# Patient Record
Sex: Female | Born: 1955
Health system: Southern US, Community
[De-identification: ages and names within clinical notes are randomized; demographics above are authoritative.]

## PROBLEM LIST (undated history)

## (undated) DIAGNOSIS — K449 Diaphragmatic hernia without obstruction or gangrene: Secondary | ICD-10-CM

## (undated) DIAGNOSIS — C50812 Malignant neoplasm of overlapping sites of left female breast: Secondary | ICD-10-CM

## (undated) DIAGNOSIS — F988 Other specified behavioral and emotional disorders with onset usually occurring in childhood and adolescence: Secondary | ICD-10-CM

## (undated) DIAGNOSIS — E782 Mixed hyperlipidemia: Secondary | ICD-10-CM

## (undated) DIAGNOSIS — F32A Depression, unspecified: Secondary | ICD-10-CM

## (undated) DIAGNOSIS — M810 Age-related osteoporosis without current pathological fracture: Secondary | ICD-10-CM

## (undated) DIAGNOSIS — C801 Malignant (primary) neoplasm, unspecified: Secondary | ICD-10-CM

## (undated) DIAGNOSIS — M7710 Lateral epicondylitis, unspecified elbow: Secondary | ICD-10-CM

## (undated) DIAGNOSIS — E041 Nontoxic single thyroid nodule: Secondary | ICD-10-CM

## (undated) DIAGNOSIS — K219 Gastro-esophageal reflux disease without esophagitis: Secondary | ICD-10-CM

## (undated) DIAGNOSIS — Z923 Personal history of irradiation: Secondary | ICD-10-CM

## (undated) DIAGNOSIS — F329 Major depressive disorder, single episode, unspecified: Secondary | ICD-10-CM

## (undated) DIAGNOSIS — R112 Nausea with vomiting, unspecified: Secondary | ICD-10-CM

## (undated) DIAGNOSIS — F419 Anxiety disorder, unspecified: Secondary | ICD-10-CM

## (undated) DIAGNOSIS — Z170 Estrogen receptor positive status [ER+]: Secondary | ICD-10-CM

## (undated) DIAGNOSIS — R002 Palpitations: Secondary | ICD-10-CM

## (undated) DIAGNOSIS — B029 Zoster without complications: Secondary | ICD-10-CM

## (undated) DIAGNOSIS — Z9889 Other specified postprocedural states: Secondary | ICD-10-CM

## (undated) HISTORY — DX: Zoster without complications: B02.9

## (undated) HISTORY — DX: Mixed hyperlipidemia: E78.2

## (undated) HISTORY — PX: THYROIDECTOMY: SHX17

## (undated) HISTORY — DX: Lateral epicondylitis, unspecified elbow: M77.10

## (undated) HISTORY — DX: Other specified behavioral and emotional disorders with onset usually occurring in childhood and adolescence: F98.8

## (undated) HISTORY — PX: TUBAL LIGATION: SHX77

## (undated) HISTORY — PX: VAGINAL HYSTERECTOMY: SUR661

## (undated) HISTORY — DX: Age-related osteoporosis without current pathological fracture: M81.0

## (undated) HISTORY — PX: ELBOW SURGERY: SHX618

---

## 1993-09-03 HISTORY — PX: BREAST EXCISIONAL BIOPSY: SUR124

## 1998-06-24 ENCOUNTER — Encounter: Payer: Self-pay | Admitting: Cardiology

## 1998-06-24 ENCOUNTER — Ambulatory Visit (HOSPITAL_COMMUNITY): Admission: RE | Admit: 1998-06-24 | Discharge: 1998-06-24 | Payer: Self-pay | Admitting: Cardiology

## 2000-04-05 ENCOUNTER — Encounter: Payer: Self-pay | Admitting: Obstetrics and Gynecology

## 2000-04-05 ENCOUNTER — Encounter: Admission: RE | Admit: 2000-04-05 | Discharge: 2000-04-05 | Payer: Self-pay | Admitting: Obstetrics and Gynecology

## 2000-10-31 ENCOUNTER — Other Ambulatory Visit: Admission: RE | Admit: 2000-10-31 | Discharge: 2000-10-31 | Payer: Self-pay | Admitting: Obstetrics and Gynecology

## 2001-05-19 ENCOUNTER — Encounter: Payer: Self-pay | Admitting: Obstetrics and Gynecology

## 2001-05-19 ENCOUNTER — Encounter: Admission: RE | Admit: 2001-05-19 | Discharge: 2001-05-19 | Payer: Self-pay | Admitting: Obstetrics and Gynecology

## 2001-08-15 ENCOUNTER — Encounter: Admission: RE | Admit: 2001-08-15 | Discharge: 2001-08-15 | Payer: Self-pay | Admitting: Endocrinology

## 2001-08-15 ENCOUNTER — Encounter: Payer: Self-pay | Admitting: Endocrinology

## 2001-10-24 ENCOUNTER — Encounter: Payer: Self-pay | Admitting: Surgery

## 2001-10-31 ENCOUNTER — Encounter (INDEPENDENT_AMBULATORY_CARE_PROVIDER_SITE_OTHER): Payer: Self-pay | Admitting: Specialist

## 2001-10-31 ENCOUNTER — Observation Stay (HOSPITAL_COMMUNITY): Admission: RE | Admit: 2001-10-31 | Discharge: 2001-11-01 | Payer: Self-pay | Admitting: Surgery

## 2002-05-01 ENCOUNTER — Other Ambulatory Visit: Admission: RE | Admit: 2002-05-01 | Discharge: 2002-05-01 | Payer: Self-pay | Admitting: Obstetrics and Gynecology

## 2002-06-08 ENCOUNTER — Encounter: Admission: RE | Admit: 2002-06-08 | Discharge: 2002-06-08 | Payer: Self-pay | Admitting: Obstetrics and Gynecology

## 2002-06-08 ENCOUNTER — Encounter: Payer: Self-pay | Admitting: Obstetrics and Gynecology

## 2002-09-22 ENCOUNTER — Encounter: Payer: Self-pay | Admitting: Obstetrics and Gynecology

## 2002-09-22 ENCOUNTER — Encounter: Admission: RE | Admit: 2002-09-22 | Discharge: 2002-09-22 | Payer: Self-pay | Admitting: Obstetrics and Gynecology

## 2003-04-29 ENCOUNTER — Encounter: Payer: Self-pay | Admitting: Emergency Medicine

## 2003-04-29 ENCOUNTER — Emergency Department (HOSPITAL_COMMUNITY): Admission: EM | Admit: 2003-04-29 | Discharge: 2003-04-29 | Payer: Self-pay | Admitting: *Deleted

## 2003-07-02 ENCOUNTER — Other Ambulatory Visit: Admission: RE | Admit: 2003-07-02 | Discharge: 2003-07-02 | Payer: Self-pay | Admitting: Obstetrics and Gynecology

## 2003-10-15 ENCOUNTER — Encounter: Payer: Self-pay | Admitting: Gastroenterology

## 2003-11-05 ENCOUNTER — Encounter: Admission: RE | Admit: 2003-11-05 | Discharge: 2003-11-05 | Payer: Self-pay | Admitting: Obstetrics and Gynecology

## 2004-12-08 ENCOUNTER — Encounter: Admission: RE | Admit: 2004-12-08 | Discharge: 2004-12-08 | Payer: Self-pay | Admitting: Obstetrics and Gynecology

## 2005-05-20 ENCOUNTER — Emergency Department (HOSPITAL_COMMUNITY): Admission: EM | Admit: 2005-05-20 | Discharge: 2005-05-20 | Payer: Self-pay | Admitting: Emergency Medicine

## 2005-05-24 ENCOUNTER — Ambulatory Visit: Payer: Self-pay | Admitting: Gastroenterology

## 2005-06-01 ENCOUNTER — Ambulatory Visit: Payer: Self-pay | Admitting: Cardiology

## 2005-06-15 ENCOUNTER — Ambulatory Visit (HOSPITAL_COMMUNITY): Admission: RE | Admit: 2005-06-15 | Discharge: 2005-06-15 | Payer: Self-pay | Admitting: Gastroenterology

## 2005-07-23 ENCOUNTER — Encounter: Admission: RE | Admit: 2005-07-23 | Discharge: 2005-07-23 | Payer: Self-pay | Admitting: Obstetrics and Gynecology

## 2006-01-14 ENCOUNTER — Encounter: Admission: RE | Admit: 2006-01-14 | Discharge: 2006-01-14 | Payer: Self-pay | Admitting: Internal Medicine

## 2007-06-24 ENCOUNTER — Encounter: Admission: RE | Admit: 2007-06-24 | Discharge: 2007-06-24 | Payer: Self-pay | Admitting: Internal Medicine

## 2007-06-24 ENCOUNTER — Encounter: Admission: RE | Admit: 2007-06-24 | Discharge: 2007-06-24 | Payer: Self-pay | Admitting: Obstetrics and Gynecology

## 2007-11-25 ENCOUNTER — Encounter: Admission: RE | Admit: 2007-11-25 | Discharge: 2007-11-25 | Payer: Self-pay | Admitting: Internal Medicine

## 2007-12-17 ENCOUNTER — Encounter: Admission: RE | Admit: 2007-12-17 | Discharge: 2007-12-17 | Payer: Self-pay | Admitting: Internal Medicine

## 2008-07-22 ENCOUNTER — Encounter: Admission: RE | Admit: 2008-07-22 | Discharge: 2008-07-22 | Payer: Self-pay | Admitting: Internal Medicine

## 2008-10-14 ENCOUNTER — Encounter: Admission: RE | Admit: 2008-10-14 | Discharge: 2008-10-14 | Payer: Self-pay | Admitting: Sports Medicine

## 2009-08-04 ENCOUNTER — Encounter: Admission: RE | Admit: 2009-08-04 | Discharge: 2009-08-04 | Payer: Self-pay | Admitting: Obstetrics and Gynecology

## 2009-09-05 ENCOUNTER — Telehealth: Payer: Self-pay | Admitting: Gastroenterology

## 2009-09-06 ENCOUNTER — Encounter: Payer: Self-pay | Admitting: Gastroenterology

## 2009-10-10 ENCOUNTER — Ambulatory Visit: Payer: Self-pay | Admitting: Gastroenterology

## 2009-10-10 DIAGNOSIS — F319 Bipolar disorder, unspecified: Secondary | ICD-10-CM | POA: Insufficient documentation

## 2009-10-10 DIAGNOSIS — K219 Gastro-esophageal reflux disease without esophagitis: Secondary | ICD-10-CM | POA: Insufficient documentation

## 2009-10-28 ENCOUNTER — Emergency Department (HOSPITAL_COMMUNITY): Admission: EM | Admit: 2009-10-28 | Discharge: 2009-10-28 | Payer: Self-pay | Admitting: Emergency Medicine

## 2009-11-14 ENCOUNTER — Encounter (INDEPENDENT_AMBULATORY_CARE_PROVIDER_SITE_OTHER): Payer: Self-pay | Admitting: *Deleted

## 2009-12-09 ENCOUNTER — Encounter (INDEPENDENT_AMBULATORY_CARE_PROVIDER_SITE_OTHER): Payer: Self-pay | Admitting: *Deleted

## 2009-12-14 ENCOUNTER — Ambulatory Visit: Payer: Self-pay | Admitting: Gastroenterology

## 2009-12-19 ENCOUNTER — Telehealth: Payer: Self-pay | Admitting: Gastroenterology

## 2009-12-20 ENCOUNTER — Ambulatory Visit: Payer: Self-pay | Admitting: Gastroenterology

## 2010-08-24 ENCOUNTER — Encounter
Admission: RE | Admit: 2010-08-24 | Discharge: 2010-08-24 | Payer: Self-pay | Source: Home / Self Care | Attending: Obstetrics and Gynecology | Admitting: Obstetrics and Gynecology

## 2010-09-24 ENCOUNTER — Encounter: Payer: Self-pay | Admitting: Obstetrics and Gynecology

## 2010-10-03 NOTE — Miscellaneous (Signed)
Summary: [facility] Previsit/prep  Clinical Lists Changes  Medications: Added new medication of MOVIPREP 100 GM  SOLR (PEG-KCL-NACL-NASULF-NA ASC-C) As per prep instructions. - Signed Rx of MOVIPREP 100 GM  SOLR (PEG-KCL-NACL-NASULF-NA ASC-C) As per prep instructions.;  #1 x 0;  Signed;  Entered by: [name] RN;  Authorized by: Louis Meckel MD;  Method used: Electronically to [facility]*, [address], [place], [place]  [address], Ph: [phone] or [phone], Fax: [phone] Observations: Added new observation of ALLERGY REV: Done (12/14/2009 14:31)    Prescriptions: MOVIPREP 100 GM  SOLR (PEG-KCL-NACL-NASULF-NA ASC-C) As per prep instructions.  #1 x 0   Entered by:   [name] RN   Authorized by:   Louis Meckel MD   Signed by:   [name] RN on 12/14/2009   Method used:   Electronically to        [facility]* (retail)       [address] [place], [place]  [address]       Ph: [phone] or [phone]       Fax: [phone]   RxID:   [id number]

## 2010-10-03 NOTE — Letter (Signed)
Summary: Results Letter  [facility]  [address] [place], [place] [address]   Phone: [phone]  Fax: [phone]        October 10, 2009 MRN: [id number]    [facility] [place] [address] [place], [place]  [address]    Dear Ms. Maradiaga,  It is my pleasure to have treated you recently as a new patient in my office. I appreciate your confidence and the opportunity to participate in your care.  Since I do have a busy inpatient endoscopy schedule and office schedule, my office hours vary weekly. I am, however, available for emergency calls everyday through my office. If I am not available for an urgent office appointment, another one of our gastroenterologist will be able to assist you.  My well-trained staff are prepared to help you at all times. For emergencies after office hours, a physician from our Gastroenterology section is always available through my 24 hour answering service  Once again I welcome you as a new patient and I look forward to a happy and healthy relationship             Sincerely,  Louis Meckel MD  This letter has been electronically signed by your physician.  Appended Document: Results Letter letter mailed

## 2010-10-03 NOTE — Letter (Signed)
Summary: Previsit letter  [facility]  [address] [place], [place] [address]   Phone: [phone]  Fax: [phone]       11/14/2009 MRN: [id number]  [facility] [place] [address] [name], [place]  [address]  Dear Ms. Belko,  Welcome to the Gastroenterology Division at [facility].    You are scheduled to see a nurse for your pre-procedure visit on 12-14-09 at 2:30pm on the 3rd floor at [facility], [address]  We ask that you try to arrive at our office 15 minutes prior to your appointment time to allow for check-in.  Your nurse visit will consist of discussing your medical and surgical history, your immediate family medical history, and your medications.    Please bring a complete list of all your medications or, if you prefer, bring the medication bottles and we will list them.  We will need to be aware of both prescribed and over the counter drugs.  We will need to know exact dosage information as well.  If you are on blood thinners (Coumadin, Plavix, Aggrenox, Ticlid, etc.) please call our office today/prior to your appointment, as we need to consult with your physician about holding your medication.   Please be prepared to read and sign documents such as consent forms, a financial agreement, and acknowledgement forms.  If necessary, and with your consent, a friend or relative is welcome to sit-in on the nurse visit with you.  Please bring your insurance card so that we may make a copy of it.  If your insurance requires a referral to see a specialist, please bring your referral form from your primary care physician.  No co-pay is required for this nurse visit.     If you cannot keep your appointment, please call [phone] to cancel or reschedule prior to your appointment date.  This allows [place] the opportunity to schedule an appointment for another patient in need of care.    Thank you for choosing [facility] for your medical needs.   We appreciate the opportunity to care for you.  Please visit [place] at our website  to learn more about our practice.                     Sincerely.                                                                                                                   The Gastroenterology Division

## 2010-10-03 NOTE — Procedures (Signed)
Summary: Colonoscopy  Patient: Dawn Booker Note: All result statuses are Final unless otherwise noted.  Tests: (1) Colonoscopy (COL)   COL Colonoscopy           DONE     [facility]     [address]     [place], [place]  [address]           COLONOSCOPY PROCEDURE REPORT           PATIENT:  Dawn Booker, Dawn Booker  MR#:  [id number]     BIRTHDATE:  December 22, 1955, 53 yrs. old  GENDER:  female           ENDOSCOPIST:  Barbette Hair. Arlyce Dice, MD     Referred by:           PROCEDURE DATE:  12/20/2009     PROCEDURE:  Diagnostic Colonoscopy     ASA CLASS:  Class II     INDICATIONS:  1) Routine Risk Screening           MEDICATIONS:   Fentanyl 125 mcg IV, Versed 13 mg IV, Benadryl 50     mg IV           DESCRIPTION OF PROCEDURE:   After the risks benefits and     alternatives of the procedure were thoroughly explained, informed     consent was obtained.  Digital rectal exam was performed and     revealed no abnormalities.   The LB CF-[id number] endoscope     was introduced through the anus and advanced to the cecum, which     was identified by both the appendix and ileocecal valve, without     limitations.  The quality of the prep was excellent, using     MoviPrep.  The instrument was then slowly withdrawn as the colon     was fully examined.     <<PROCEDUREIMAGES>>           FINDINGS:  A normal appearing cecum, ileocecal valve, and     appendiceal orifice were identified. The ascending, hepatic     flexure, transverse, splenic flexure, descending, sigmoid colon,     and rectum appeared unremarkable (see image1, image2, image3,     image4, image5, and image7).   Retroflexed views in the rectum     revealed no abnormalities.    The time to cecum =  4.25  minutes.     The scope was then withdrawn (time =  5  min) from the patient and     the procedure completed.           COMPLICATIONS:  None           ENDOSCOPIC IMPRESSION:     1) Normal colon     RECOMMENDATIONS:     1) Continue  current colorectal screening recommendations for     "routine risk" patients with a repeat colonoscopy in 10 years.           REPEAT EXAM:  In 10 year(s) for Colonoscopy.           ______________________________     Barbette Hair. Arlyce Dice, MD           CC: Guerry Bruin, MD           n.     Rosalie Doctor:   Barbette Hair. Kaplan at 12/20/2009 09:06 AM           Adrian Blackwater, [id number]  Note: An exclamation mark (!) indicates a result that  was not dispersed into the flowsheet. Document Creation Date: 12/20/2009 9:06 AM _______________________________________________________________________  (1) Order result status: Final Collection or observation date-time: 12/20/2009 08:59 Requested date-time:  Receipt date-time:  Reported date-time:  Referring Physician:   Ordering Physician: Melvia Heaps [phone]) Specimen Source:  Source: Launa Grill Order Number: [id number] Lab site:   Appended Document: Colonoscopy    Clinical Lists Changes  Observations: Added new observation of COLONNXTDUE: 12/2019 (12/20/2009 13:19)

## 2010-10-03 NOTE — Progress Notes (Signed)
Summary: Patient wants to switch Physician   Phone Note Call from Patient Call back at Home Phone [phone] Call back at [phone]   Caller: Patient Call For: Dr. Arlyce Dice Reason for Call: Talk to Nurse Summary of Call: Pt. is a patient of Dr. Russella Dar she is wanting to switch to Dr. Arlyce Dice to do a colonoscopy. Pt said she saw Dr. Arlyce Dice in the past but I do not see any record of this. Initial call taken by: Karna Christmas,  September 05, 2009 3:10 PM  Follow-up for Phone Call        last appointment with Dr Russella Dar in 06, I have requested her paper chart for both MD review. Darcey Nora RN, [facility]  September 05, 2009 3:25 PM  Patient doesn't feel Dr Russella Dar has helped her.  She wants to schedule a colonoscopy with Dr Arlyce Dice.  I have pulled her paper chart and see no hx with Dr Arlyce Dice.  Patient  would still like to transfer to Dr Arlyce Dice.  Dr Russella Dar do you approve? Follow-up by: Darcey Nora RN, CGRN,  September 06, 2009 8:49 AM  Additional Follow-up for Phone Call Additional follow up Details #1::        OK with me. Additional Follow-up by: Meryl Dare MD Clementeen Graham,  September 06, 2009 9:43 AM    Additional Follow-up for Phone Call Additional follow up Details #2::    Dr Arlyce Dice will you accept this patient? Follow-up by: Darcey Nora RN, CGRN,  September 06, 2009 9:47 AM  Additional Follow-up for Phone Call Additional follow up Details #3:: Details for Additional Follow-up Action Taken: yes Additional Follow-up by: Louis Meckel MD,  September 06, 2009 10:11 AM   Appended Document: Patient wants to switch Physician Message left for pt. that MD switch is approved and she has an appt. with Dr.Kaplan on 10-03-09 at 11:15am, appt. letter also mailed to her. Pt. instructed to call back as needed.

## 2010-10-03 NOTE — Letter (Signed)
Summary: [facility] Instructions  [facility]  [address] [place], [place] [address]   Phone: [phone]  Fax: [phone]       Dawn Booker    December 07, 1955    MRN: [id number]        Procedure Day Dorna Bloom:  Jake Shark  12/20/09     Arrival Time:  7:30AM     Procedure Time:  8:30AM     Location of Procedure:                    _ X_  [facility] (4th Floor)                        PREPARATION FOR COLONOSCOPY WITH MOVIPREP   Starting 5 days prior to your procedure 12/15/09 do not eat nuts, seeds, popcorn, corn, beans, peas,  salads, or any raw vegetables.  Do not take any fiber supplements (e.g. Metamucil, Citrucel, and Benefiber).  THE DAY BEFORE YOUR PROCEDURE         DATE: 12/19/09  DAY: MONDAY  1.  Drink clear liquids the entire day-NO SOLID FOOD  2.  Do not drink anything colored red or purple.  Avoid juices with pulp.  No orange juice.  3.  Drink at least 64 oz. (8 glasses) of fluid/clear liquids during the day to prevent dehydration and help the prep work efficiently.  CLEAR LIQUIDS INCLUDE: Water Jello Ice Popsicles Tea (sugar ok, no milk/cream) Powdered fruit flavored drinks Coffee (sugar ok, no milk/cream) Gatorade Juice: apple, white grape, white cranberry  Lemonade Clear bullion, consomm, broth Carbonated beverages (any kind) Strained chicken noodle soup Hard Candy                             4.  In the morning, mix first dose of MoviPrep solution:    Empty 1 Pouch A and 1 Pouch B into the disposable container    Add lukewarm drinking water to the top line of the container. Mix to dissolve    Refrigerate (mixed solution should be used within 24 hrs)  5.  Begin drinking the prep at 5:00 p.m. The MoviPrep container is divided by 4 marks.   Every 15 minutes drink the solution down to the next mark (approximately 8 oz) until the full liter is complete.   6.  Follow completed prep with 16 oz of clear liquid of your choice (Nothing  red or purple).  Continue to drink clear liquids until bedtime.  7.  Before going to bed, mix second dose of MoviPrep solution:    Empty 1 Pouch A and 1 Pouch B into the disposable container    Add lukewarm drinking water to the top line of the container. Mix to dissolve    Refrigerate  THE DAY OF YOUR PROCEDURE      DATE: 12/20/09   DAY: TUESDAY  Beginning at 3:30AM (5 hours before procedure):         1. Every 15 minutes, drink the solution down to the next mark (approx 8 oz) until the full liter is complete.  2. Follow completed prep with 16 oz. of clear liquid of your choice.    3. You may drink clear liquids until 6:30AM (2 HOURS BEFORE PROCEDURE).   MEDICATION INSTRUCTIONS  Unless otherwise instructed, you should take regular prescription medications with a small sip of water   as early as possible the  morning of your procedure.           OTHER INSTRUCTIONS  You will need a responsible adult at least 55 years of age to accompany you and drive you home.   This person must remain in the waiting room during your procedure.  Wear loose fitting clothing that is easily removed.  Leave jewelry and other valuables at home.  However, you may wish to bring a book to read or  an iPod/MP3 player to listen to music as you wait for your procedure to start.  Remove all body piercing jewelry and leave at home.  Total time from sign-in until discharge is approximately 2-3 hours.  You should go home directly after your procedure and rest.  You can resume normal activities the  day after your procedure.  The day of your procedure you should not:   Drive   Make legal decisions   Operate machinery   Drink alcohol   Return to work  You will receive specific instructions about eating, activities and medications before you leave.    The above instructions have been reviewed and explained to me by   [name] RN  December 14, 2009 3:22 PM     I fully understand and  can verbalize these instructions _____________________________ Date _________

## 2010-10-03 NOTE — Letter (Signed)
Summary: New Patient letter  [facility]  [address] [place], [place] [address]   Phone: [phone]  Fax: [phone]       09/06/2009 MRN: [id number]  [facility] [place] [address] [place], [place]  [address]  Dear Ms. Awan,  Welcome to the Gastroenterology Division at [facility].    You are scheduled to see Dr.  Melvia Heaps on 10-03-2009 at 11:15am,  on the 3rd floor at [facility], [address]  We ask that you try to arrive at our office 15 minutes prior to your appointment time to allow for check-in.  We would like you to complete the enclosed self-administered evaluation form prior to your visit and bring it with you on the day of your appointment.  We will review it with you.  Also, please bring a complete list of all your medications or, if you prefer, bring the medication bottles and we will list them.  Please bring your insurance card so that we may make a copy of it.  If your insurance requires a referral to see a specialist, please bring your referral form from your primary care physician.  Co-payments are due at the time of your visit and may be paid by cash, check or credit card.     Your office visit will consist of a consult with your physician (includes a physical exam), any laboratory testing he/she may order, scheduling of any necessary diagnostic testing (e.g. x-ray, ultrasound, CT-scan), and scheduling of a procedure (e.g. Endoscopy, Colonoscopy) if required.  Please allow enough time on your schedule to allow for any/all of these possibilities.    If you cannot keep your appointment, please call [phone] to cancel or reschedule prior to your appointment date.  This allows [place] the opportunity to schedule an appointment for another patient in need of care.  If you do not cancel or reschedule by 5 p.m. the business day prior to your appointment date, you will be charged a $50.00 late cancellation/no-show fee.    Thank you for  choosing [facility] for your medical needs.  We appreciate the opportunity to care for you.  Please visit [place] at our website  to learn more about our practice.                     Sincerely,                                                             The Gastroenterology Division   Appended Document: New Patient letter Letter mailed to patient.

## 2010-10-03 NOTE — Procedures (Signed)
Summary: EGD   EGD  Procedure date:  10/15/2003  Findings:      Location: [facility]   Patient Name: Dawn, Booker MRN:  Procedure Procedures: Panendoscopy (EGD) CPT: 43235.    with biopsy(s)/brushing(s). CPT: D1846139.  Personnel: Endoscopist: Venita Lick. Russella Dar, MD, Clementeen Graham.  Exam Location: Exam performed in Outpatient Clinic. Outpatient  Patient Consent: Procedure, Alternatives, Risks and Benefits discussed, consent obtained, from patient. Consent was obtained by the RN.  Indications Symptoms: Chest Pain. Reflux symptoms  History  Current Medications: Patient is not currently taking Coumadin.  Pre-Exam Physical: Performed Oct 15, 2003  Entire physical exam was normal.  Exam Exam Info: Maximum depth of insertion Duodenum, intended Duodenum. Vocal cords not visualized. Gastric retroflexion performed. ASA Classification: II. Tolerance: excellent.  Sedation Meds: Patient assessed and found to be appropriate for moderate (conscious) sedation. Fentanyl 50 mcg. given IV. Versed 5 mg. given IV. Cetacaine Spray 2 sprays given aerosolized.  Monitoring: BP and pulse monitoring done. Oximetry used. Supplemental O2 given  Findings Normal: Proximal Esophagus to Distal Esophagus.  HIATAL HERNIA: Regular, 3 cms. in length. ICD9: Hernia, Hiatal: 553.3. NODULE: Maximum size: 4 mm. mucosal nodule in Body. ICD9: Neoplasia, Benign, Stomach: 211.1. Comments: Multiple nodules in the body and fundus-several were biopsied.  Normal: Antrum to Duodenal 2nd Portion.   Assessment  Diagnoses: 211.1: Neoplasia, Benign, Stomach.  553.3: Hernia, Hiatal.   Events  Unplanned Intervention: No unplanned interventions were required.  Unplanned Events: There were no complications. Plans Medication(s): Await pathology. Continue current medications.  Patient Education: Patient given standard instructions for: Hiatal Hernia. Reflux.  Disposition: After procedure patient sent  to recovery. After recovery patient sent home.  Scheduling: Await pathology to schedule patient. Primary Care Provider, to Guerry Bruin, MD,  Office Visit, to [facility]. Russella Dar, MD, Clementeen Graham, prn    cc: Guerry Bruin, MD  This report was created from the original endoscopy report, which was reviewed and signed by the above listed endoscopist.

## 2010-10-03 NOTE — Assessment & Plan Note (Signed)
Summary: Office visit- GI [facility]ONA [facility]OST MR#:  [id number] Page #  NAME:  Dawn Booker, Dawn Booker  OFFICE NO:  [id number]  DATE:  05/24/05  DOB:  2056-02-26  HISTORY OF PRESENT ILLNESS:  The patient had the acute onset of right-sided abdominal pain associated with nausea, vomiting, and diarrhea beginning this past Saturday.  It occurred about 3 hours after she had eaten a salad that she had prepared at home from a head of lettuce.  She states there was no hematemesis, hematochezia, melena, fevers, or chills.  Her symptoms improved slightly without eating the rest of the day and when she had a small amount to eat on Sunday, she developed severe right upper quadrant pain that radiated around her right flank to her back and radiated to her right lower chest and recurrent diarrhea.  She presented to [facility] Room at about 2 p.m. on Sunday, September 17.  Vital signs, CBC, C-MET, amylase, and lipase were all unremarkable.  An abdominal ultrasound was performed that showed a small mass in the left kidney with an echogenic focus.  It measured 8 mm x 9 mm x 11 mm.  The differential diagnosis included an angiomyolipoma versus a partially calcified renal mass.  A CT scan or MRI of the kidneys was recommended.  Her symptoms since then have completely resolved except for some very minimal right upper quadrant soreness.  She has been eat normally and her bowel habits have returned to normal.    CURRENT MEDICATIONS:  Listed on the chart, updated and reviewed.   MEDICATION ALLERGIES: Sulfa drugs.  PHYSICAL EXAMINATION:  No acute distress.  Weight 155.4 pounds. Blood pressure is 112/72.  Pulse 68 and regular. HEENT exam:  Anicteric sclerae; oropharynx clear.  Chest:  Clear to auscultation bilaterally.  Cardiac:  Regular rate and rhythm without murmurs.  Abdomen is soft with minimal right upper quadrant tenderness to very deep palpation.  No rebound or guarding.  No palpable organomegaly, masses, or  hernias.  Normoactive bowel sounds. Back exam reveals no costovertebral angle or spinal tenderness.     ASSESSMENT AND PLAN:   1.  Acute right upper quadrant abdominal pain, associated with diarrhea.  I suspect she has food poisoning or viral gastroenteritis.  Will proceed with a CCK stimulated hepatobiliary scan to exclude a calculus cholecystitis.   2.  A small left renal lesion.  Will review her prior ultrasound studies performed in our office in 2000, 2001, and 2005.  If this lesion cannot be confirmed on the prior studies, then we will proceed with a detailed CT scan of the kidneys for further evaluation.       Venita Lick. Russella Dar, M.D., F.A.C.G. [id number] cc:  Gaspar Garbe, M.D.  D:  05/24/05; T:  ; Job [id number]

## 2010-10-03 NOTE — Assessment & Plan Note (Signed)
Summary: TO ESTABLISH W/DR.Arlyce Dice, MD [facility] APPROVED.      Dawn Booker    History of Present Illness Visit Type: Initial Visit Primary GI MD: Melvia Heaps MD [facility] Primary Provider: Sarita Haver Requesting Provider: n/a Chief Complaint: To establish care with Dr Arlyce Dice, was pt of Dr Russella Dar and to discuss colonoscopy History of Present Illness:   Dawn Booker is a pleasant 55 year old white female referred at the request of  Guerry Bruin, M.D. for colorectal cancer screening.  Her main GI complaint is mild constipation.  She denies melena or hematochezia.  On one occasion, approximately one month ago, she had an episode of severe crampy abdominal pain that began in her upper abdomen and evenually moved to her lower abdomen.  This was not followed by any bowel movements for 24 hours.  She also has a history of GERD for which she takes Prevacid    GI Review of Systems    Reports abdominal pain, acid reflux, heartburn, and  nausea.      Denies belching, bloating, chest pain, dysphagia with liquids, dysphagia with solids, loss of appetite, vomiting, vomiting blood, weight loss, and  weight gain.      Reports change in bowel habits and  constipation.     Denies anal fissure, black tarry stools, diarrhea, diverticulosis, fecal incontinence, heme positive stool, hemorrhoids, irritable bowel syndrome, jaundice, light color stool, liver problems, rectal bleeding, and  rectal pain. Preventive Screening-Counseling & Management  Alcohol-Tobacco     Smoking Status: current    Current Medications (verified): 1)  Fish Oil  Oil (Fish Oil) 2)  Wellbutrin Xl 300 Mg Xr24h-Tab (Bupropion Hcl) .Marland Kitchen.. 1 By Mouth Once Daily 3)  Alprazolam 0.5 Mg Tabs (Alprazolam) .Marland Kitchen.. 1 By Mouth As Needed 4)  Folic Acid 400 Mcg Tabs (Folic Acid) .Marland Kitchen.. 1 By Mouth Once Daily 5)  Temazepam 30 Mg Caps (Temazepam) .Marland Kitchen.. 1 By Mouth Once Daily 6)  Vitamin D3 2000 Unit Caps (Cholecalciferol) .Marland Kitchen.. 1 By Mouth Once Daily 7)   Prevacid 24hr 15 Mg Cpdr (Lansoprazole) .Marland Kitchen.. 1 By Mouth Once Daily 8)  Lexapro 5 Mg Tabs (Escitalopram Oxalate) .Marland Kitchen.. 1 By Mouth Once Daily 9)  Elestrin 0.52 Mg/0.87 Gm (0.06%) Gel (Estradiol) .... Use Daily 10)  Benadryl 25 Mg Tabs (Diphenhydramine Hcl) .... As Needed  Allergies: 1)  ! Sulfa 2)  ! Pcn 3)  ! Morphine  Past History:  Past Medical History: Hiatal hernia GERD Bipolar disorder Mitral valve prolapse Hyperlipidemia Anxiety Disorder Depression  Past Surgical History: Hysterectomy 1990's  Family History: Family History of Breast Cancer: Family History of Colon Cancer: Family History of Colon Polyps: Family History of Diabetes:  Family History of Heart Disease:   Social History: married 2 children Occupation: [place] [employer] Alcohol Use - yes  social Patient currently smokes. social Smoking Status:  current  Review of Systems       The patient complains of anxiety-new, back pain, headaches-new, and night sweats.  The patient denies allergy/sinus, anemia, arthritis/joint pain, blood in urine, breast changes/lumps, change in vision, confusion, cough, coughing up blood, depression-new, fainting, fatigue, fever, hearing problems, heart murmur, heart rhythm changes, itching, muscle pains/cramps, nosebleeds, shortness of breath, skin rash, sleeping problems, sore throat, swelling of feet/legs, swollen lymph glands, thirst - excessive, urination - excessive, urination changes/pain, urine leakage, vision changes, and voice change.         All other systems were reviewed and were negative   Vital Signs:  Patient profile:   55 year  old female Height:      65.5 inches Weight:      156 pounds BMI:     25.66 BSA:     1.79 Pulse rate:   80 / minute BP sitting:   100 / 70  (left arm)  Vitals Entered By: Merri Ray CMA Duncan Dull) (October 10, 2009 3:21 PM)  Physical Exam  Additional Exam:  She is a healthy-appearing female  skin:  anicteric HEENT: normocephalic; PEERLA; no nasal or pharyngeal abnormalities neck: supple nodes: no cervical lymphadenopathy chest: clear to ausculatation and percussion heart: no murmurs, gallops, or rubs abd: soft, nontender; BS normoactive; no abdominal masses, tenderness, organomegaly rectal: deferred ext: no cynanosis, clubbing, edema skeletal: no deformities neuro: oriented x 3; no focal abnormalities    Impression & Recommendations:  Problem # 1:  SPECIAL SCREENING FOR MALIGNANT NEOPLASMS COLON (ICD-V76.51) Plans screening colonoscopy  Risks, complications and alternatives to the procedure were explained to the patient, including bleeding, perforation, and the possible need for surgery.  Patients questions were answered.  Problem # 2:  ESOPHAGEAL REFLUX (ICD-530.81) Plan  to continue Prevacid  Patient Instructions: 1)  Colonoscopy and Flexible Sigmoidoscopy brochure given.  2)  Conscious Sedation brochure given.  3)  You will need to call back to schedule your colonoscopy at your convenience. 4)  At that time you will be scheduled a Pre-visit with a nurse to get all instructions and sign paperwork 5)  Continue Prevacid 6)  cc Guerry Bruin, M.D. 7)  The medication list was reviewed and reconciled.  All changed / newly prescribed medications were explained.  A complete medication list was provided to the patient / caregiver.

## 2010-10-03 NOTE — Progress Notes (Signed)
Summary: prep ?   Phone Note Call from Patient Call back at [facility] Phone [phone]   Caller: Patient Call For: Dr. Arlyce Dice Reason for Call: Talk to Nurse Summary of Call: prep ? Initial call taken by: Vallarie Mare,  December 19, 2009 2:24 PM  Follow-up for Phone Call        Returned phone call to pt.  No answer, left message on the answering machine to please call back before 5:00 if possible if still has a question. Follow-up by: Eual Fines RN,  December 19, 2009 3:00 PM  Additional Follow-up for Phone Call Additional follow up Details #1::        recieved call from pt. wanting to know if she could still have broth  and popcsile if she gets hungry? informed pt. that she could have broth and items that was listed on prep sheet until 2hrs before procedure. Additional Follow-up by: Eual Fines RN,  December 19, 2009 3:15 PM

## 2011-01-19 NOTE — Op Note (Signed)
[facility]  Patient:    Dawn Booker, Dawn Booker Visit Number: [id number] MRN: [id number]          Service Type: SUR Location: [id number] Attending Physician:  Bonnetta Barry Dictated by:   Velora Heckler, M.D. Proc. Date: 10/31/01 Admit Date:  10/31/2001 Discharge Date: 11/01/2001   CC:         Jeannett Senior A. Evlyn Kanner, M.D.   Operative Report  PREOPERATIVE DIAGNOSIS:  Left thyroid nodule.  POSTOPERATIVE DIAGNOSIS:  Left thyroid nodule.  PROCEDURE:  Left thyroid lobectomy.  SURGEON:  Velora Heckler, M.D.  ASSISTANT:  Gita Kudo, M.D.  ANESTHESIA:  General.  ESTIMATED BLOOD LOSS:  Minimal.  PREPARATION:  Betadine.  COMPLICATIONS:  None.  INDICATIONS:  The patient is a 55 year old white female, who presents to my practice with a longstanding left-sided thyroid nodule.  This has been present for approximately four years.  Fine-needle aspiration cytology has been benign.  The patient, however, has noted a slow increase in the size of the nodule.  She now comes to surgery for excision.  DESCRIPTION OF PROCEDURE:  The procedure is done in OR #10 at the [facility].  The patient is brought to the operating room and placed in a supine position on the operating room table.  Following administration of general anesthesia, the patient is prepped and draped in the usual strict aseptic fashion.  After ascertaining that an adequate level of anesthesia had been obtained, a Kocher incision is made with a #10 blade.  Dissection is carried through the subcutaneous tissues and platysma.  Hemostasis is obtained with the electrocautery.  Next, skin flaps are developed cephalad and caudad from the thyroid notch to the sternal notch.  A Mahorner self-retaining retractor is placed for exposure.  Strap muscles are incised in the midline. Dissection is carried down to the thyroid gland.  Dissection is begun on the left side.  Strap muscles are  reflected laterally.  Left lobe is markedly enlarged containing a large central nodule, measuring approximately 3-3.5 cm in greatest dimension.  Venous tributaries are divided between small Ligaclips.  Gland is rolled anteriorly.  Using blunt dissection, adventitial tissue is dissected away from the thyroid capsule.  Superior pole is mobilized.  Superior pole vessels are ligated in continuity with 2-0 silk ligatures and medium Ligaclips.  Vessels are then divided.  Gland is rolled anteriorly.  Recurrent laryngeal nerve is identified and preserved. Parathyroid tissue is identified and preserved.  Gland is rolled up and onto the trachea.  Ligament of Allyson Sabal is divided with the electrocautery.  Branches of the inferior thyroid artery are divided between small Ligaclips.  Gland is mobilized across the trachea.  Isthmus is divided between hemostats, and the left lobe is completely excised.  It is sectioned on the table.  There is approximately a 3 cm large nodule located centrally.  This is submitted fresh to pathology for frozen section analysis.  Dr. Jessica Priest Smir reads this as a benign follicular lesion, most likely representing a hyperplastic nodule.  No sign of malignancy was identified.  Next, the right lobe is explored.  Strap muscles are reflected laterally.  On palpitation, the right lobe is soft without nodularity.  It is of normal size.  No further dissection is performed in the right neck.  The left neck is irrigated with warm saline.  A piece of Surgicel is placed over the area of the recurrent laryngeal nerves.  Strap muscles are reapproximated in the midline  with interrupted 3-0 Vicryl sutures. Platysma is reapproximated with interrupted 3-0 Vicryl sutures.  Skin edges are closed with widely spaced stainless steel staples and interspaced half-inch Steri-Strips and Benzoin.  Sterile gauze dressings are applied.  The patient is awakened from anesthesia and brought to the recovery room  in stable condition.  The patient tolerated the procedure well. Dictated by:   Velora Heckler, M.D. Attending Physician:  Bonnetta Barry DD:  10/31/01 TD:  11/02/01 Job: [id number]

## 2011-07-16 ENCOUNTER — Other Ambulatory Visit: Payer: Self-pay | Admitting: Obstetrics and Gynecology

## 2011-07-16 DIAGNOSIS — Z1231 Encounter for screening mammogram for malignant neoplasm of breast: Secondary | ICD-10-CM

## 2011-08-30 ENCOUNTER — Ambulatory Visit
Admission: RE | Admit: 2011-08-30 | Discharge: 2011-08-30 | Disposition: A | Discharge status: Home / Self Care | Payer: BC Managed Care – PPO | Source: Ambulatory Visit | Attending: Obstetrics and Gynecology | Admitting: Obstetrics and Gynecology

## 2011-08-30 DIAGNOSIS — Z1231 Encounter for screening mammogram for malignant neoplasm of breast: Secondary | ICD-10-CM

## 2012-05-09 ENCOUNTER — Other Ambulatory Visit: Payer: Self-pay | Admitting: Obstetrics and Gynecology

## 2012-05-09 DIAGNOSIS — M8580 Other specified disorders of bone density and structure, unspecified site: Secondary | ICD-10-CM

## 2012-05-14 ENCOUNTER — Other Ambulatory Visit: Payer: BC Managed Care – PPO

## 2012-08-06 ENCOUNTER — Other Ambulatory Visit: Payer: Self-pay | Admitting: Obstetrics and Gynecology

## 2012-08-06 DIAGNOSIS — Z1231 Encounter for screening mammogram for malignant neoplasm of breast: Secondary | ICD-10-CM

## 2012-10-10 ENCOUNTER — Ambulatory Visit: Payer: BC Managed Care – PPO

## 2012-10-24 ENCOUNTER — Ambulatory Visit: Payer: BC Managed Care – PPO

## 2013-06-03 ENCOUNTER — Telehealth: Payer: Self-pay | Admitting: Genetic Counselor

## 2013-06-03 NOTE — Telephone Encounter (Signed)
LEFT PT VM TO RETURN CALL IN REF. TO GENETIC COUNS °

## 2013-09-30 ENCOUNTER — Emergency Department (HOSPITAL_COMMUNITY): Payer: BC Managed Care – PPO

## 2013-09-30 ENCOUNTER — Encounter (HOSPITAL_COMMUNITY): Payer: Self-pay | Admitting: Emergency Medicine

## 2013-09-30 ENCOUNTER — Emergency Department (HOSPITAL_COMMUNITY)
Admission: EM | Admit: 2013-09-30 | Discharge: 2013-09-30 | Disposition: A | Discharge status: Home / Self Care | Payer: BC Managed Care – PPO | Attending: Emergency Medicine | Admitting: Emergency Medicine

## 2013-09-30 DIAGNOSIS — F411 Generalized anxiety disorder: Secondary | ICD-10-CM | POA: Insufficient documentation

## 2013-09-30 DIAGNOSIS — R0789 Other chest pain: Secondary | ICD-10-CM

## 2013-09-30 DIAGNOSIS — Z8719 Personal history of other diseases of the digestive system: Secondary | ICD-10-CM | POA: Insufficient documentation

## 2013-09-30 DIAGNOSIS — Z79899 Other long term (current) drug therapy: Secondary | ICD-10-CM | POA: Insufficient documentation

## 2013-09-30 DIAGNOSIS — R071 Chest pain on breathing: Secondary | ICD-10-CM | POA: Insufficient documentation

## 2013-09-30 DIAGNOSIS — F3289 Other specified depressive episodes: Secondary | ICD-10-CM | POA: Insufficient documentation

## 2013-09-30 DIAGNOSIS — F329 Major depressive disorder, single episode, unspecified: Secondary | ICD-10-CM | POA: Insufficient documentation

## 2013-09-30 DIAGNOSIS — Z8679 Personal history of other diseases of the circulatory system: Secondary | ICD-10-CM | POA: Insufficient documentation

## 2013-09-30 DIAGNOSIS — F17200 Nicotine dependence, unspecified, uncomplicated: Secondary | ICD-10-CM | POA: Insufficient documentation

## 2013-09-30 HISTORY — DX: Anxiety disorder, unspecified: F41.9

## 2013-09-30 HISTORY — DX: Major depressive disorder, single episode, unspecified: F32.9

## 2013-09-30 HISTORY — DX: Depression, unspecified: F32.A

## 2013-09-30 LAB — COMPREHENSIVE METABOLIC PANEL
ALT: 15 U/L (ref 0–35)
AST: 16 U/L (ref 0–37)
Albumin: 4.3 g/dL (ref 3.5–5.2)
Alkaline Phosphatase: 55 U/L (ref 39–117)
BUN: 13 mg/dL (ref 6–23)
CO2: 24 mEq/L (ref 19–32)
Calcium: 9 mg/dL (ref 8.4–10.5)
Chloride: 105 mEq/L (ref 96–112)
Creatinine, Ser: 0.61 mg/dL (ref 0.50–1.10)
GFR calc Af Amer: >90 mL/min (ref >90)
GFR calc non Af Amer: >90 mL/min (ref >90)
Glucose, Bld: 95 mg/dL (ref 70–99)
Potassium: 3.7 mEq/L (ref 3.7–5.3)
Sodium: 142 mEq/L (ref 137–147)
Total Bilirubin: 0.3 mg/dL (ref 0.3–1.2)
Total Protein: 7.2 g/dL (ref 6.0–8.3)

## 2013-09-30 LAB — CBC
HCT: 38.6 % (ref 36.0–46.0)
Hemoglobin: 13.4 g/dL (ref 12.0–15.0)
MCH: 31.9 pg (ref 26.0–34.0)
MCHC: 34.7 g/dL (ref 30.0–36.0)
MCV: 91.9 fL (ref 78.0–100.0)
Platelets: 202 10*3/uL (ref 150–400)
RBC: 4.2 MIL/uL (ref 3.87–5.11)
RDW: 12.6 % (ref 11.5–15.5)
WBC: 5.3 10*3/uL (ref 4.0–10.5)

## 2013-09-30 LAB — POCT I-STAT TROPONIN I: Troponin i, poc: 0 ng/mL (ref 0.00–0.08)

## 2013-09-30 MED ORDER — IBUPROFEN 600 MG PO TABS: 600.0000 mg | ORAL_TABLET | Freq: Four times a day (QID) | ORAL | Status: DC | PRN

## 2013-09-30 NOTE — ED Provider Notes (Signed)
CSN: [id number]     Arrival date & time 09/30/13  1326 History   First MD Initiated Contact with Patient 09/30/13 1439     Chief Complaint  Patient presents with  . Chest Pain   (Consider location/radiation/quality/duration/timing/severity/associated sxs/prior Treatment) Patient is a 58 y.o. female presenting with chest pain. The history is provided by the patient and a relative.  Chest Pain  Patient here complaining of persistent left-sided chest pain that has been constant since this morning. Pain characterized as a dull ache and worse with positions. No shortness of breath or diaphoresis. No rashes appreciated. No coughing or wheezing noted. Denies any trauma. No treatment used prior to arrival.  Past Medical History  Diagnosis Date  . Acid reflux   . Anxiety   . Depression   . MVP (mitral valve prolapse)    History reviewed. No pertinent past surgical history. No family history on file. History  Substance Use Topics  . Smoking status: Current Every Day Smoker  . Smokeless tobacco: Not on file  . Alcohol Use: Yes   OB History   Grav Para Term Preterm Abortions TAB SAB Ect Mult Living                 Review of Systems  Cardiovascular: Positive for chest pain.  All other systems reviewed and are negative.    Allergies  Morphine; Prednisone; Sulfonamide derivatives; and Other  Home Medications   Current Outpatient Rx  Name  Route  Sig  Dispense  Refill  . acetaminophen (TYLENOL) 500 MG tablet   Oral   Take 500 mg by mouth every 6 (six) hours as needed (sleep).         . cholecalciferol (VITAMIN D) 1000 UNITS tablet   Oral   Take 2,000 Units by mouth daily.         . diphenhydrAMINE (BENADRYL) 25 mg capsule   Oral   Take 25 mg by mouth at bedtime.         . folic acid (FOLVITE) 474 MCG tablet   Oral   Take 400 mcg by mouth daily.         . Omega-3 Fatty Acids (FISH OIL) 1200 MG CAPS   Oral   Take 1,200 mg by mouth daily.         . sertraline  (ZOLOFT) 50 MG tablet   Oral   Take 50 mg by mouth daily.         . temazepam (RESTORIL) 15 MG capsule   Oral   Take 15 mg by mouth at bedtime.         Marland Kitchen tiZANidine (ZANAFLEX) 4 MG tablet   Oral   Take 4 mg by mouth every 6 (six) hours as needed for muscle spasms.          BP 144/84  Pulse 67  Temp(Src) 98 F (36.7 C)  Resp 16  Wt 134 lb (60.782 kg)  SpO2 100% Physical Exam  Nursing note and vitals reviewed. Constitutional: She is oriented to person, place, and time. She appears well-developed and well-nourished.  Non-toxic appearance. No distress.  HENT:  Head: Normocephalic and atraumatic.  Eyes: Conjunctivae, EOM and lids are normal. Pupils are equal, round, and reactive to light.  Neck: Normal range of motion. Neck supple. No tracheal deviation present. No mass present.  Cardiovascular: Normal rate, regular rhythm and normal heart sounds.  Exam reveals no gallop.   No murmur heard. Pulmonary/Chest: Effort normal and breath sounds normal. No  stridor. No respiratory distress. She has no decreased breath sounds. She has no wheezes. She has no rhonchi. She has no rales. She exhibits bony tenderness. She exhibits no crepitus.    Abdominal: Soft. Normal appearance and bowel sounds are normal. She exhibits no distension. There is no tenderness. There is no rebound and no CVA tenderness.  Musculoskeletal: Normal range of motion. She exhibits no edema and no tenderness.  Neurological: She is alert and oriented to person, place, and time. She has normal strength. No cranial nerve deficit or sensory deficit. GCS eye subscore is 4. GCS verbal subscore is 5. GCS motor subscore is 6.  Skin: Skin is warm and dry. No abrasion and no rash noted.  Psychiatric: She has a normal mood and affect. Her speech is normal and behavior is normal.    ED Course  Procedures (including critical care time) Labs Review Labs Reviewed  CBC  COMPREHENSIVE METABOLIC PANEL  POCT I-STAT TROPONIN I    Imaging Review Dg Chest 2 View  09/30/2013   CLINICAL DATA:  Chest pain and mild dyspnea, history of tobacco use  EXAM: CHEST  2 VIEW  COMPARISON:  DG CHEST 2 VIEW dated 06/24/2007  FINDINGS: The lungs are adequately inflated and clear. The cardiac silhouette is normal in size. The pulmonary vascularity is not engorged. The mediastinum is normal in width. There is no pleural effusion or pneumothorax. The observed portions of the bony thorax appear normal.  IMPRESSION: No active cardiopulmonary disease.   Electronically Signed   By: David  Martinique   On: 09/30/2013 15:00    EKG Interpretation    Date/Time:  Wednesday September 30 2013 13:32:04 EST Ventricular Rate:  67 PR Interval:  140 QRS Duration: 82 QT Interval:  402 QTC Calculation: 424 R Axis:   83 Text Interpretation:  Normal sinus rhythm Nonspecific ST abnormality Abnormal ECG Confirmed by Derl Abalos  MD, [place] Hair ([id number]) on 09/30/2013 2:39:55 PM            MDM  No diagnosis found.   Patient with chest wall pain and no concern for ACS and will be discharged to home  Leota Jacobsen, MD 09/30/13 [phone]

## 2013-09-30 NOTE — Discharge Instructions (Signed)

## 2013-09-30 NOTE — ED Notes (Signed)
Cp started this am got nauseated hot,  some sob  midsternal  Pain in left arm bicep and forearm

## 2013-10-13 ENCOUNTER — Other Ambulatory Visit: Payer: Self-pay

## 2013-10-13 DIAGNOSIS — Z1231 Encounter for screening mammogram for malignant neoplasm of breast: Secondary | ICD-10-CM

## 2013-10-13 DIAGNOSIS — Z803 Family history of malignant neoplasm of breast: Secondary | ICD-10-CM

## 2013-10-30 ENCOUNTER — Ambulatory Visit: Payer: BC Managed Care – PPO

## 2013-12-03 ENCOUNTER — Ambulatory Visit
Admission: RE | Admit: 2013-12-03 | Discharge: 2013-12-03 | Disposition: A | Discharge status: Home / Self Care | Payer: BC Managed Care – PPO | Source: Ambulatory Visit

## 2013-12-03 DIAGNOSIS — Z803 Family history of malignant neoplasm of breast: Secondary | ICD-10-CM

## 2013-12-03 DIAGNOSIS — Z1231 Encounter for screening mammogram for malignant neoplasm of breast: Secondary | ICD-10-CM

## 2014-02-18 ENCOUNTER — Other Ambulatory Visit: Payer: Self-pay | Admitting: Family Medicine

## 2014-02-18 ENCOUNTER — Other Ambulatory Visit: Payer: BC Managed Care – PPO

## 2014-02-18 ENCOUNTER — Ambulatory Visit
Admission: RE | Admit: 2014-02-18 | Discharge: 2014-02-18 | Disposition: A | Discharge status: Home / Self Care | Payer: BC Managed Care – PPO | Source: Ambulatory Visit | Attending: Family Medicine | Admitting: Family Medicine

## 2014-02-18 DIAGNOSIS — G43909 Migraine, unspecified, not intractable, without status migrainosus: Secondary | ICD-10-CM

## 2014-03-02 ENCOUNTER — Other Ambulatory Visit: Payer: Self-pay | Admitting: Dermatology

## 2014-03-15 ENCOUNTER — Encounter: Payer: Self-pay | Admitting: Gastroenterology

## 2014-05-17 ENCOUNTER — Encounter: Payer: Self-pay | Admitting: *Deleted

## 2014-05-25 ENCOUNTER — Ambulatory Visit: Payer: BC Managed Care – PPO | Admitting: Gastroenterology

## 2014-05-27 ENCOUNTER — Other Ambulatory Visit: Payer: Self-pay | Admitting: Gastroenterology

## 2014-05-27 DIAGNOSIS — R1032 Left lower quadrant pain: Secondary | ICD-10-CM

## 2014-06-09 ENCOUNTER — Other Ambulatory Visit: Payer: BC Managed Care – PPO

## 2014-06-14 ENCOUNTER — Ambulatory Visit
Admission: RE | Admit: 2014-06-14 | Discharge: 2014-06-14 | Disposition: A | Discharge status: Home / Self Care | Payer: BC Managed Care – PPO | Source: Ambulatory Visit | Attending: Gastroenterology | Admitting: Gastroenterology

## 2014-06-14 DIAGNOSIS — R1032 Left lower quadrant pain: Secondary | ICD-10-CM

## 2014-06-14 MED ORDER — IOHEXOL 300 MG/ML  SOLN
100.0000 mL | Freq: Once | INTRAMUSCULAR | Status: AC | PRN
  Administered 2014-06-14: 100 mL via INTRAVENOUS

## 2015-01-03 LAB — RUBELLA SCREEN: Rubella Antibodies, IGG: 0.8

## 2015-01-03 LAB — MUMPS ANTIBODY, IGG: Mumps IgG: 11.7

## 2015-01-03 LAB — QUANTIFERON-TB GOLD PLUS: QUANTIFERON TB GOLD: NEGATIVE

## 2015-03-01 ENCOUNTER — Encounter: Payer: Self-pay | Admitting: Gastroenterology

## 2015-09-12 MED FILL — BUPROPION HCL XL 150 MG TAB: 150 | 90 days supply | Qty: 90 | Fill #0

## 2015-09-12 MED FILL — TEMAZEPAM 30 MG CAPSULE: 30 | 30 days supply | Qty: 30 | Fill #0

## 2015-10-07 MED FILL — L-METHYLFOLATE 15 MG CAPLET: 15 | 90 days supply | Qty: 90 | Fill #1

## 2015-10-12 MED FILL — TEMAZEPAM 30 MG CAPSULE: 30 | 30 days supply | Qty: 30 | Fill #0

## 2015-10-20 DIAGNOSIS — K594 Anal spasm: Secondary | ICD-10-CM | POA: Diagnosis not present

## 2015-10-20 DIAGNOSIS — R140 Abdominal distension (gaseous): Secondary | ICD-10-CM | POA: Diagnosis not present

## 2015-10-20 DIAGNOSIS — R1033 Periumbilical pain: Secondary | ICD-10-CM | POA: Diagnosis not present

## 2015-10-21 MED FILL — ANASPAZ 0.125 MG TABLET ODT: 0.125 | 15 days supply | Qty: 120 | Fill #0

## 2015-11-11 MED FILL — TEMAZEPAM 30 MG CAPSULE: 30 | 30 days supply | Qty: 30 | Fill #0

## 2015-11-18 DIAGNOSIS — H40013 Open angle with borderline findings, low risk, bilateral: Secondary | ICD-10-CM | POA: Diagnosis not present

## 2015-11-18 DIAGNOSIS — H43391 Other vitreous opacities, right eye: Secondary | ICD-10-CM | POA: Diagnosis not present

## 2015-11-18 DIAGNOSIS — H43812 Vitreous degeneration, left eye: Secondary | ICD-10-CM | POA: Diagnosis not present

## 2015-11-21 MED FILL — BUPROPION HCL XL 300 MG TAB: 300 | 30 days supply | Qty: 30 | Fill #0

## 2015-11-29 DIAGNOSIS — F411 Generalized anxiety disorder: Secondary | ICD-10-CM | POA: Diagnosis not present

## 2015-11-29 DIAGNOSIS — G479 Sleep disorder, unspecified: Secondary | ICD-10-CM | POA: Diagnosis not present

## 2015-11-29 DIAGNOSIS — Z0000 Encounter for general adult medical examination without abnormal findings: Secondary | ICD-10-CM | POA: Diagnosis not present

## 2015-11-29 DIAGNOSIS — E782 Mixed hyperlipidemia: Secondary | ICD-10-CM | POA: Diagnosis not present

## 2015-11-29 DIAGNOSIS — F322 Major depressive disorder, single episode, severe without psychotic features: Secondary | ICD-10-CM | POA: Diagnosis not present

## 2015-12-09 MED FILL — TEMAZEPAM 30 MG CAPSULE: 30 | 30 days supply | Qty: 30 | Fill #0

## 2015-12-19 MED FILL — BUPROPION HCL XL 300 MG TAB: 300 | 90 days supply | Qty: 90 | Fill #0

## 2016-01-09 MED FILL — TEMAZEPAM 30 MG CAPSULE: 30 | 30 days supply | Qty: 30 | Fill #0

## 2016-01-25 DIAGNOSIS — L821 Other seborrheic keratosis: Secondary | ICD-10-CM | POA: Diagnosis not present

## 2016-02-08 MED FILL — TEMAZEPAM 30 MG CAPSULE: 30 | 30 days supply | Qty: 30 | Fill #0

## 2016-02-28 DIAGNOSIS — H9209 Otalgia, unspecified ear: Secondary | ICD-10-CM | POA: Diagnosis not present

## 2016-02-28 DIAGNOSIS — H6980 Other specified disorders of Eustachian tube, unspecified ear: Secondary | ICD-10-CM | POA: Diagnosis not present

## 2016-02-28 MED FILL — MOMETASONE FUROATE 50 MCG S: 50 | 30 days supply | Qty: 17 | Fill #0

## 2016-03-02 DIAGNOSIS — H02719 Chloasma of unspecified eye, unspecified eyelid and periocular area: Secondary | ICD-10-CM | POA: Diagnosis not present

## 2016-03-02 DIAGNOSIS — L821 Other seborrheic keratosis: Secondary | ICD-10-CM | POA: Diagnosis not present

## 2016-03-02 DIAGNOSIS — D1801 Hemangioma of skin and subcutaneous tissue: Secondary | ICD-10-CM | POA: Diagnosis not present

## 2016-03-02 DIAGNOSIS — L814 Other melanin hyperpigmentation: Secondary | ICD-10-CM | POA: Diagnosis not present

## 2016-03-02 DIAGNOSIS — D225 Melanocytic nevi of trunk: Secondary | ICD-10-CM | POA: Diagnosis not present

## 2016-03-08 MED FILL — TEMAZEPAM 30 MG CAPSULE: 30 | 30 days supply | Qty: 30 | Fill #0

## 2016-03-18 MED FILL — BUPROPION HCL XL 300 MG TAB: 300 | 90 days supply | Qty: 90 | Fill #1

## 2016-03-29 DIAGNOSIS — B373 Candidiasis of vulva and vagina: Secondary | ICD-10-CM | POA: Diagnosis not present

## 2016-03-29 MED FILL — FLUCONAZOLE 150 MG TABLET: 150 | 7 days supply | Qty: 3 | Fill #0

## 2016-04-02 MED FILL — L-METHYLFOLATE 15 MG CAPLET: 15 | 90 days supply | Qty: 90 | Fill #0

## 2016-04-10 MED FILL — TEMAZEPAM 30 MG CAPSULE: 30 | 30 days supply | Qty: 30 | Fill #0

## 2016-05-08 DIAGNOSIS — H903 Sensorineural hearing loss, bilateral: Secondary | ICD-10-CM | POA: Diagnosis not present

## 2016-05-08 DIAGNOSIS — H9313 Tinnitus, bilateral: Secondary | ICD-10-CM | POA: Diagnosis not present

## 2016-05-08 MED FILL — TEMAZEPAM 30 MG CAPSULE: 30 | 30 days supply | Qty: 30 | Fill #0

## 2016-05-10 ENCOUNTER — Other Ambulatory Visit (HOSPITAL_COMMUNITY): Payer: Self-pay | Admitting: Otolaryngology

## 2016-05-10 ENCOUNTER — Other Ambulatory Visit: Payer: Self-pay | Admitting: Otolaryngology

## 2016-05-10 DIAGNOSIS — H903 Sensorineural hearing loss, bilateral: Secondary | ICD-10-CM

## 2016-05-10 DIAGNOSIS — H9313 Tinnitus, bilateral: Secondary | ICD-10-CM

## 2016-05-12 ENCOUNTER — Ambulatory Visit (HOSPITAL_COMMUNITY)
Admission: RE | Admit: 2016-05-12 | Discharge: 2016-05-12 | Disposition: A | Discharge status: Home / Self Care | Payer: 59 | Source: Ambulatory Visit | Attending: Otolaryngology | Admitting: Otolaryngology

## 2016-05-12 DIAGNOSIS — H9313 Tinnitus, bilateral: Secondary | ICD-10-CM | POA: Insufficient documentation

## 2016-05-12 DIAGNOSIS — H903 Sensorineural hearing loss, bilateral: Secondary | ICD-10-CM | POA: Diagnosis not present

## 2016-05-12 MED ORDER — GADOBENATE DIMEGLUMINE 529 MG/ML IV SOLN
12.0000 mL | Freq: Once | INTRAVENOUS | Status: AC | PRN
  Administered 2016-05-12: 12 mL via INTRAVENOUS

## 2016-05-15 ENCOUNTER — Ambulatory Visit (HOSPITAL_COMMUNITY): Payer: BC Managed Care – PPO

## 2016-06-06 MED FILL — TEMAZEPAM 30 MG CAPSULE: 30 | 30 days supply | Qty: 30 | Fill #0

## 2016-06-08 DIAGNOSIS — H9319 Tinnitus, unspecified ear: Secondary | ICD-10-CM | POA: Diagnosis not present

## 2016-06-08 DIAGNOSIS — F322 Major depressive disorder, single episode, severe without psychotic features: Secondary | ICD-10-CM | POA: Diagnosis not present

## 2016-06-08 DIAGNOSIS — G479 Sleep disorder, unspecified: Secondary | ICD-10-CM | POA: Diagnosis not present

## 2016-06-08 MED FILL — BUPROPION HCL SR 100 MG TAB: 100 | 30 days supply | Qty: 60 | Fill #0

## 2016-06-25 MED FILL — BUPROPION HCL XL 300 MG TAB: 300 | 90 days supply | Qty: 90 | Fill #0

## 2016-07-06 MED FILL — TEMAZEPAM 30 MG CAPSULE: 30 | 30 days supply | Qty: 30 | Fill #0

## 2016-08-06 MED FILL — TEMAZEPAM 30 MG CAPSULE: 30 | 30 days supply | Qty: 30 | Fill #0

## 2016-08-28 MED FILL — L-METHYLFOLATE 15 MG CAPLET: 15 | 90 days supply | Qty: 90 | Fill #1

## 2016-08-31 DIAGNOSIS — J342 Deviated nasal septum: Secondary | ICD-10-CM | POA: Insufficient documentation

## 2016-08-31 DIAGNOSIS — R0981 Nasal congestion: Secondary | ICD-10-CM | POA: Diagnosis not present

## 2016-08-31 MED FILL — FLUTICASONE PROP 50 MCG SPR: 50 | 30 days supply | Qty: 16 | Fill #0

## 2016-09-04 ENCOUNTER — Other Ambulatory Visit (HOSPITAL_COMMUNITY): Payer: Self-pay | Admitting: Physician Assistant

## 2016-09-04 DIAGNOSIS — R0981 Nasal congestion: Secondary | ICD-10-CM

## 2016-09-04 DIAGNOSIS — J342 Deviated nasal septum: Secondary | ICD-10-CM

## 2016-09-05 MED FILL — TEMAZEPAM 30 MG CAPSULE: 30 | 30 days supply | Qty: 30 | Fill #0

## 2016-09-24 MED FILL — BUPROPION HCL XL 300 MG TAB: 300 | 90 days supply | Qty: 90 | Fill #1

## 2016-10-04 ENCOUNTER — Ambulatory Visit (HOSPITAL_COMMUNITY)
Admission: RE | Admit: 2016-10-04 | Discharge: 2016-10-04 | Disposition: A | Discharge status: Home / Self Care | Payer: 59 | Source: Ambulatory Visit | Attending: Physician Assistant | Admitting: Physician Assistant

## 2016-10-04 DIAGNOSIS — R0981 Nasal congestion: Secondary | ICD-10-CM | POA: Insufficient documentation

## 2016-10-04 DIAGNOSIS — R0989 Other specified symptoms and signs involving the circulatory and respiratory systems: Secondary | ICD-10-CM | POA: Diagnosis not present

## 2016-10-04 DIAGNOSIS — J342 Deviated nasal septum: Secondary | ICD-10-CM | POA: Diagnosis not present

## 2016-10-04 MED FILL — TEMAZEPAM 30 MG CAPSULE: 30 | 30 days supply | Qty: 30 | Fill #0

## 2016-10-09 DIAGNOSIS — J343 Hypertrophy of nasal turbinates: Secondary | ICD-10-CM | POA: Diagnosis not present

## 2016-10-09 DIAGNOSIS — J342 Deviated nasal septum: Secondary | ICD-10-CM | POA: Diagnosis not present

## 2016-10-09 DIAGNOSIS — R0981 Nasal congestion: Secondary | ICD-10-CM | POA: Diagnosis not present

## 2016-11-01 MED FILL — TEMAZEPAM 30 MG CAPSULE: 30 | 30 days supply | Qty: 30 | Fill #1

## 2016-11-23 DIAGNOSIS — H43812 Vitreous degeneration, left eye: Secondary | ICD-10-CM | POA: Diagnosis not present

## 2016-11-23 DIAGNOSIS — H40013 Open angle with borderline findings, low risk, bilateral: Secondary | ICD-10-CM | POA: Diagnosis not present

## 2016-11-23 DIAGNOSIS — H43391 Other vitreous opacities, right eye: Secondary | ICD-10-CM | POA: Diagnosis not present

## 2016-11-26 DIAGNOSIS — L719 Rosacea, unspecified: Secondary | ICD-10-CM | POA: Diagnosis not present

## 2016-11-26 DIAGNOSIS — D485 Neoplasm of uncertain behavior of skin: Secondary | ICD-10-CM | POA: Diagnosis not present

## 2016-11-29 MED FILL — L-METHYLFOLATE 15 MG CAPLET: 15 | 30 days supply | Qty: 30 | Fill #0

## 2016-12-02 MED FILL — TEMAZEPAM 30 MG CAPSULE: 30 | 30 days supply | Qty: 30 | Fill #2

## 2016-12-23 MED FILL — BUPROPION HCL XL 300 MG TAB: 300 | 90 days supply | Qty: 90 | Fill #2

## 2016-12-24 MED FILL — L-METHYLFOLATE 15 MG CAPLET: 15 | 30 days supply | Qty: 30 | Fill #1

## 2016-12-28 ENCOUNTER — Other Ambulatory Visit: Payer: Self-pay | Admitting: Family Medicine

## 2016-12-28 DIAGNOSIS — M8588 Other specified disorders of bone density and structure, other site: Secondary | ICD-10-CM | POA: Diagnosis not present

## 2016-12-28 DIAGNOSIS — R946 Abnormal results of thyroid function studies: Secondary | ICD-10-CM | POA: Diagnosis not present

## 2016-12-28 DIAGNOSIS — G479 Sleep disorder, unspecified: Secondary | ICD-10-CM | POA: Diagnosis not present

## 2016-12-28 DIAGNOSIS — F322 Major depressive disorder, single episode, severe without psychotic features: Secondary | ICD-10-CM | POA: Diagnosis not present

## 2016-12-28 DIAGNOSIS — R5381 Other malaise: Secondary | ICD-10-CM | POA: Diagnosis not present

## 2016-12-28 DIAGNOSIS — E782 Mixed hyperlipidemia: Secondary | ICD-10-CM | POA: Diagnosis not present

## 2016-12-28 DIAGNOSIS — Z0000 Encounter for general adult medical examination without abnormal findings: Secondary | ICD-10-CM | POA: Diagnosis not present

## 2016-12-28 DIAGNOSIS — E041 Nontoxic single thyroid nodule: Secondary | ICD-10-CM

## 2016-12-28 MED FILL — traZODone HCL 50 MG TABS: 50 | 30 days supply | Qty: 90 | Fill #0

## 2017-01-02 ENCOUNTER — Other Ambulatory Visit (HOSPITAL_COMMUNITY): Payer: Self-pay | Admitting: Family Medicine

## 2017-01-02 DIAGNOSIS — R946 Abnormal results of thyroid function studies: Secondary | ICD-10-CM

## 2017-01-02 MED FILL — TEMAZEPAM 30 MG CAPSULE: 30 | 30 days supply | Qty: 30 | Fill #0

## 2017-01-07 ENCOUNTER — Ambulatory Visit (HOSPITAL_COMMUNITY)
Admission: RE | Admit: 2017-01-07 | Discharge: 2017-01-07 | Disposition: A | Discharge status: Home / Self Care | Payer: 59 | Source: Ambulatory Visit | Attending: Family Medicine | Admitting: Family Medicine

## 2017-01-07 DIAGNOSIS — E041 Nontoxic single thyroid nodule: Secondary | ICD-10-CM | POA: Diagnosis not present

## 2017-01-07 DIAGNOSIS — E890 Postprocedural hypothyroidism: Secondary | ICD-10-CM | POA: Insufficient documentation

## 2017-01-07 DIAGNOSIS — R946 Abnormal results of thyroid function studies: Secondary | ICD-10-CM | POA: Diagnosis not present

## 2017-01-29 MED FILL — L-METHYLFOLATE 15 MG CAPLET: 15 | 30 days supply | Qty: 30 | Fill #2

## 2017-01-31 MED FILL — TEMAZEPAM 30 MG CAPSULE: 30 | 30 days supply | Qty: 30 | Fill #1

## 2017-03-04 MED FILL — L-METHYLFOLATE 15 MG CAPLET: 15 | 30 days supply | Qty: 30 | Fill #3

## 2017-03-04 MED FILL — TEMAZEPAM 30 MG CAPSULE: 30 | 30 days supply | Qty: 30 | Fill #0

## 2017-04-01 MED FILL — L-METHYLFOLATE 15 MG CAPLET: 15 | 30 days supply | Qty: 30 | Fill #4

## 2017-04-01 MED FILL — buPROPion HCL ER (XL) 300 M: 300 | 90 days supply | Qty: 90 | Fill #3

## 2017-04-08 MED FILL — TEMAZEPAM 30 MG CAPSULE: 30 | 30 days supply | Qty: 30 | Fill #0

## 2017-04-26 MED FILL — HYDROXYZINE PAM 25 MG CAP: 25 | 90 days supply | Qty: 90 | Fill #0

## 2017-05-07 MED FILL — TEMAZEPAM 30 MG CAPSULE: 30 | 30 days supply | Qty: 30 | Fill #1

## 2017-05-07 MED FILL — L-METHYLFOLATE 15 MG CAPLET: 15 | 30 days supply | Qty: 30 | Fill #5

## 2017-05-31 ENCOUNTER — Encounter: Payer: Self-pay | Admitting: Internal Medicine

## 2017-05-31 ENCOUNTER — Ambulatory Visit (INDEPENDENT_AMBULATORY_CARE_PROVIDER_SITE_OTHER): Payer: 59 | Admitting: Internal Medicine

## 2017-05-31 VITALS — BP 130/88 | HR 74 | Ht 65.5 in | Wt 140.2 lb

## 2017-05-31 DIAGNOSIS — E785 Hyperlipidemia, unspecified: Secondary | ICD-10-CM | POA: Diagnosis not present

## 2017-05-31 DIAGNOSIS — R0602 Shortness of breath: Secondary | ICD-10-CM

## 2017-05-31 DIAGNOSIS — R079 Chest pain, unspecified: Secondary | ICD-10-CM

## 2017-05-31 MED ORDER — ROSUVASTATIN CALCIUM 10 MG PO TABS: 10.0000 mg | ORAL_TABLET | Freq: Every day | ORAL | 3 refills | Status: DC

## 2017-05-31 MED ORDER — ROSUVASTATIN CALCIUM 10 MG PO TABS: 10.0000 mg | ORAL_TABLET | Freq: Every day | ORAL | 11 refills | Status: DC

## 2017-05-31 MED FILL — ROSUVASTATIN CALCIUM 10 MG: 10 | 30 days supply | Qty: 30 | Fill #0 | Status: TO

## 2017-05-31 NOTE — Patient Instructions (Signed)
Your physician has recommended you make the following change in your medication:  1) start rosuvastatin (Crestor) 10 mg once a day  Your physician recommends that you return for lab work in: 2 months (LIPIDS)  Your physician has requested that you have an echocardiogram. Echocardiography is a painless test that uses sound waves to create images of your heart. It provides your doctor with information about the size and shape of your heart and how well your heart's chambers and valves are working. This procedure takes approximately one hour. There are no restrictions for this procedure.  Your physician has requested that you have en exercise stress myoview. For further information please visit [email or web address]. Please follow instruction sheet, as given.  Follow up with your physician will depend on test results.

## 2017-05-31 NOTE — Progress Notes (Signed)
Cardiology Office Note   Date:  05/31/2017   ID:  Dawn Booker, DOB 06-Apr-1956, MRN [id number]  PCP:   Serina Cowper Cardiologist:   Dorris Carnes, MD   Pt presents  Referred for chest/neck discomft by Dr Harrington Challenger    History of Present Illness: Dawn Booker is a 61 y.o. female with not prior cardiac history   Pt says that she has been under increased stress  At home   Mother died in May 01, 2023  She as caregiver. Hx of MVP Pt says she had swelling in nec  Pain  USN done    Symptoms have calmed some since May Does not an irreg heart beat and heart beating harder after she eats.    With activity will get SOB  Does have knee problems that limits activity    Current Meds  Medication Sig  . acetaminophen (TYLENOL) 500 MG tablet Take 500 mg by mouth every 6 (six) hours as needed (sleep).  Marland Kitchen buPROPion (WELLBUTRIN XL) 150 MG 24 hr tablet Take 150 mg by mouth every other day.  . cholecalciferol (VITAMIN D) 1000 UNITS tablet Take 2,000 Units by mouth daily.  . diphenhydrAMINE (BENADRYL) 25 mg capsule Take 25 mg by mouth at bedtime.  Marland Kitchen ibuprofen (ADVIL,MOTRIN) 600 MG tablet Take 1 tablet (600 mg total) by mouth every 6 (six) hours as needed.  Marland Kitchen L-Methylfolate 15 MG TABS Take 15 mg by mouth daily.   . temazepam (RESTORIL) 15 MG capsule Take 15 mg by mouth at bedtime.     Allergies:   Brintellix [vortioxetine]; Lamictal [lamotrigine]; Morphine; Prednisone; Sulfamethoxazole; Sulfonamide derivatives; Zoloft [sertraline]; and Other   Past Medical History:  Diagnosis Date  . Acid reflux   . ADD (attention deficit disorder)   . Anxiety   . BIPOLAR DISORDER UNSPECIFIED 10/10/2009   Qualifier: Diagnosis of  By: Deatra Ina MD, Sandy Salaam   . Depression   . Esophageal reflux 10/10/2009   Qualifier: Diagnosis of  By: Deatra Ina MD, Sandy Salaam   . Hyperlipemia, mixed   . Insomnia   . MVP (mitral valve prolapse)     History reviewed. No pertinent surgical history.   Social History:  The patient  reports  that she has been smoking.  She has never used smokeless tobacco. She reports that she does not drink alcohol or use drugs.   Family History:  The patient's family history includes Bipolar disorder in her father; Heart attack in her mother; Hypertension in her mother.    ROS:  Please see the history of present illness. All other systems are reviewed and  Negative to the above problem except as noted.    PHYSICAL EXAM: VS:  BP 130/88   Pulse 74   Ht 5' 5.5" (1.664 m)   Wt 140 lb 3.2 oz (63.6 kg)   SpO2 99%   BMI 22.98 kg/m   GEN: Well nourished, well developed, in no acute distress  HEENT: normal  Neck: no JVD, carotid bruits, or masses Cardiac: RRR; no murmurs, rubs, or gallops,no edema  Respiratory:  clear to auscultation bilaterally, normal work of breathing GI: soft, nontender, nondistended, + BS  No hepatomegaly  MS: no deformity Moving all extremities   Skin: warm and dry, no rash Neuro:  Strength and sensation are intact Psych: euthymic mood, full affect   EKG:  EKG is ordered today.  SR 74 bpm  Sl ST depression    Lipid Panel No results found for: CHOL, TRIG, HDL, CHOLHDL, VLDL,  [place], LDLDIRECT    Wt Readings from Last 3 Encounters:  05/31/17 140 lb 3.2 oz (63.6 kg)  09/30/13 134 lb (60.8 kg)  10/10/09 156 lb (70.8 kg)      ASSESSMENT AND PLAN:  1  CP  Improving   I am not convinced this represents angina  But she has atherosclerosis of aorta on CT scan  With dyspnea I would recomm sched for an echo  I would also set upfor a lexican myovue to r/o ischemia    2  HL   With plaquing of the aorta she should be on a statin  I would recomm Crestor 10 mg   F/U lpipdis in 2 months     Current medicines are reviewed at length with the patient today.  The patient does not have concerns regarding medicines.  Signed, Dorris Carnes, MD  05/31/2017 1:55 PM    [facility] [place], [place], [place]  [address] Phone: [phone]; Fax: [phone]

## 2017-06-06 MED FILL — buPROPion HCL ER (XL) 150 M: 150 | 30 days supply | Qty: 30 | Fill #0

## 2017-06-06 MED FILL — TEMAZEPAM 30 MG CAPSULE: 30 | 30 days supply | Qty: 30 | Fill #2

## 2017-06-12 MED FILL — L-METHYLFOLATE 15 MG CAPLET: 15 | 30 days supply | Qty: 30 | Fill #0

## 2017-06-18 ENCOUNTER — Telehealth (HOSPITAL_COMMUNITY): Payer: Self-pay | Admitting: *Deleted

## 2017-06-18 NOTE — Telephone Encounter (Signed)
Patient given detailed instructions per Myocardial Perfusion Study Information Sheet for the test on 06/21/17 at 0715. Patient notified to arrive 15 minutes early and that it is imperative to arrive on time for appointment to keep from having the test rescheduled.  If you need to cancel or reschedule your appointment, please call the office within 24 hours of your appointment. . Patient verbalized understanding.Dawn Booker, Ranae Palms

## 2017-06-21 ENCOUNTER — Other Ambulatory Visit: Payer: Self-pay

## 2017-06-21 ENCOUNTER — Ambulatory Visit (HOSPITAL_BASED_OUTPATIENT_CLINIC_OR_DEPARTMENT_OTHER): Payer: 59

## 2017-06-21 ENCOUNTER — Ambulatory Visit (HOSPITAL_COMMUNITY): Payer: 59 | Attending: Cardiovascular Disease

## 2017-06-21 DIAGNOSIS — I313 Pericardial effusion (noninflammatory): Secondary | ICD-10-CM | POA: Diagnosis not present

## 2017-06-21 DIAGNOSIS — R0602 Shortness of breath: Secondary | ICD-10-CM | POA: Diagnosis not present

## 2017-06-21 DIAGNOSIS — E785 Hyperlipidemia, unspecified: Secondary | ICD-10-CM | POA: Insufficient documentation

## 2017-06-21 DIAGNOSIS — R0600 Dyspnea, unspecified: Secondary | ICD-10-CM | POA: Insufficient documentation

## 2017-06-21 DIAGNOSIS — R079 Chest pain, unspecified: Secondary | ICD-10-CM

## 2017-06-21 HISTORY — PX: TRANSTHORACIC ECHOCARDIOGRAM: SHX275

## 2017-06-21 HISTORY — PX: CARDIOVASCULAR STRESS TEST: SHX262

## 2017-06-21 LAB — MYOCARDIAL PERFUSION IMAGING
Estimated workload: 7 METS
Exercise duration (min): 6 min
Exercise duration (sec): 0 s
LV dias vol: 59 mL (ref 46–106)
LV sys vol: 17 mL
MPHR: 160 {beats}/min
Peak HR: 148 {beats}/min
Percent HR: 92 %
RATE: 0.31
Rest HR: 77 {beats}/min
SDS: 5
SRS: 7
SSS: 9
TID: 1.21

## 2017-06-21 MED ORDER — TECHNETIUM TC 99M TETROFOSMIN IV KIT
10.7000 | PACK | Freq: Once | INTRAVENOUS | Status: AC | PRN
  Administered 2017-06-21: 10.7 via INTRAVENOUS
  Filled 2017-06-21: qty 11

## 2017-06-21 MED ORDER — TECHNETIUM TC 99M TETROFOSMIN IV KIT
31.5000 | PACK | Freq: Once | INTRAVENOUS | Status: AC | PRN
  Administered 2017-06-21: 31.5 via INTRAVENOUS
  Filled 2017-06-21: qty 32

## 2017-06-26 DIAGNOSIS — S93491A Sprain of other ligament of right ankle, initial encounter: Secondary | ICD-10-CM | POA: Diagnosis not present

## 2017-06-26 DIAGNOSIS — M545 Low back pain: Secondary | ICD-10-CM | POA: Diagnosis not present

## 2017-06-26 DIAGNOSIS — M1611 Unilateral primary osteoarthritis, right hip: Secondary | ICD-10-CM | POA: Diagnosis not present

## 2017-06-26 MED FILL — MELOXICAM 7.5 MG TABLET: 7.5 | 30 days supply | Qty: 30 | Fill #0

## 2017-06-28 ENCOUNTER — Telehealth: Payer: Self-pay | Admitting: Internal Medicine

## 2017-06-28 NOTE — Telephone Encounter (Signed)
New message     Patient calling to discuss diagnosis in more detail.  Please call

## 2017-06-28 NOTE — Telephone Encounter (Signed)
Spoke with pt and reviewed the results of her echo in more detail.  All questions were answered and she thanked me for calling back and the information.

## 2017-07-01 MED FILL — ROSUVASTATIN CALCIUM 10 MG: 10 | 30 days supply | Qty: 30 | Fill #0

## 2017-07-04 MED FILL — TEMAZEPAM 30 MG CAPSULE: 30 | 30 days supply | Qty: 30 | Fill #0

## 2017-07-08 MED FILL — BUPROPION HCL XL 150 MG TAB: 150 | 90 days supply | Qty: 90 | Fill #0

## 2017-07-19 ENCOUNTER — Other Ambulatory Visit: Payer: 59 | Admitting: *Deleted

## 2017-07-19 DIAGNOSIS — R0602 Shortness of breath: Secondary | ICD-10-CM | POA: Diagnosis not present

## 2017-07-19 DIAGNOSIS — R079 Chest pain, unspecified: Secondary | ICD-10-CM

## 2017-07-19 DIAGNOSIS — E785 Hyperlipidemia, unspecified: Secondary | ICD-10-CM | POA: Diagnosis not present

## 2017-07-19 LAB — LIPID PANEL
Chol/HDL Ratio: 2.3 ratio (ref 0.0–4.4)
Cholesterol, Total: 141 mg/dL (ref 100–199)
HDL: 62 mg/dL (ref >39)
LDL Calculated: 67 mg/dL (ref 0–99)
Triglycerides: 60 mg/dL (ref 0–149)
VLDL Cholesterol Cal: 12 mg/dL (ref 5–40)

## 2017-07-28 MED FILL — ROSUVASTATIN CALCIUM 10 MG: 10 | 30 days supply | Qty: 30 | Fill #1

## 2017-07-30 ENCOUNTER — Other Ambulatory Visit: Payer: Self-pay | Admitting: Gastroenterology

## 2017-07-30 ENCOUNTER — Telehealth: Payer: Self-pay | Admitting: Internal Medicine

## 2017-07-30 DIAGNOSIS — K449 Diaphragmatic hernia without obstruction or gangrene: Secondary | ICD-10-CM | POA: Diagnosis not present

## 2017-07-30 DIAGNOSIS — R1013 Epigastric pain: Secondary | ICD-10-CM | POA: Diagnosis not present

## 2017-07-30 DIAGNOSIS — R112 Nausea with vomiting, unspecified: Secondary | ICD-10-CM

## 2017-07-30 DIAGNOSIS — R1011 Right upper quadrant pain: Secondary | ICD-10-CM | POA: Diagnosis not present

## 2017-07-30 DIAGNOSIS — R110 Nausea: Secondary | ICD-10-CM | POA: Diagnosis not present

## 2017-07-30 DIAGNOSIS — K828 Other specified diseases of gallbladder: Secondary | ICD-10-CM | POA: Diagnosis not present

## 2017-07-30 NOTE — Telephone Encounter (Signed)
Lab results and plan to continue current treatment plan reviewed with patient who verbalized understanding. She thanked me for the call.

## 2017-07-30 NOTE — Telephone Encounter (Signed)
New message   Patient calling for lab results. Please call after 11am per pt.

## 2017-08-05 MED FILL — TEMAZEPAM 30 MG CAPSULE: 30 | 30 days supply | Qty: 30 | Fill #0

## 2017-08-07 DIAGNOSIS — K449 Diaphragmatic hernia without obstruction or gangrene: Secondary | ICD-10-CM | POA: Diagnosis not present

## 2017-08-07 DIAGNOSIS — K317 Polyp of stomach and duodenum: Secondary | ICD-10-CM | POA: Diagnosis not present

## 2017-08-07 DIAGNOSIS — R110 Nausea: Secondary | ICD-10-CM | POA: Diagnosis not present

## 2017-08-07 DIAGNOSIS — R1013 Epigastric pain: Secondary | ICD-10-CM | POA: Diagnosis not present

## 2017-08-07 MED FILL — OMEPRAZOLE DR 40 MG CAPSULE: 40 | 90 days supply | Qty: 90 | Fill #0

## 2017-08-09 ENCOUNTER — Encounter (HOSPITAL_COMMUNITY)
Admission: RE | Admit: 2017-08-09 | Discharge: 2017-08-09 | Disposition: A | Discharge status: Home / Self Care | Payer: 59 | Source: Ambulatory Visit | Attending: Gastroenterology | Admitting: Gastroenterology

## 2017-08-09 ENCOUNTER — Encounter (HOSPITAL_COMMUNITY): Payer: Self-pay

## 2017-08-09 DIAGNOSIS — R112 Nausea with vomiting, unspecified: Secondary | ICD-10-CM

## 2017-08-09 DIAGNOSIS — R1011 Right upper quadrant pain: Secondary | ICD-10-CM | POA: Insufficient documentation

## 2017-08-09 MED ORDER — TECHNETIUM TC 99M MEBROFENIN IV KIT
5.0000 | PACK | Freq: Once | INTRAVENOUS | Status: AC | PRN
  Administered 2017-08-09: 5 via INTRAVENOUS

## 2017-09-02 MED FILL — ROSUVASTATIN CALCIUM 10 MG: 10 | 30 days supply | Qty: 30 | Fill #2

## 2017-09-03 DIAGNOSIS — C801 Malignant (primary) neoplasm, unspecified: Secondary | ICD-10-CM

## 2017-09-03 HISTORY — DX: Malignant (primary) neoplasm, unspecified: C80.1

## 2017-09-06 MED FILL — TEMAZEPAM 30 MG CAPSULE: 30 | 30 days supply | Qty: 30 | Fill #0

## 2017-09-09 MED FILL — L-METHYLFOLATE 15 MG CAPLET: 15 | 30 days supply | Qty: 30 | Fill #1

## 2017-09-10 DIAGNOSIS — R1011 Right upper quadrant pain: Secondary | ICD-10-CM | POA: Diagnosis not present

## 2017-09-10 DIAGNOSIS — K219 Gastro-esophageal reflux disease without esophagitis: Secondary | ICD-10-CM | POA: Diagnosis not present

## 2017-09-10 DIAGNOSIS — K449 Diaphragmatic hernia without obstruction or gangrene: Secondary | ICD-10-CM | POA: Diagnosis not present

## 2017-09-30 MED FILL — buPROPion HCL ER (XL) 150 M: 150 | 90 days supply | Qty: 90 | Fill #0

## 2017-10-03 MED FILL — ROSUVASTATIN CALCIUM 10 MG: 10 | 30 days supply | Qty: 30 | Fill #3

## 2017-10-03 MED FILL — L-METHYLFOLATE 15 MG CAPLET: 15 | 30 days supply | Qty: 30 | Fill #2

## 2017-10-04 MED FILL — TEMAZEPAM 15 MG CAPSULE: 15 | 30 days supply | Qty: 60 | Fill #0

## 2017-11-04 MED FILL — TEMAZEPAM 15 MG CAPSULE: 15 | 30 days supply | Qty: 60 | Fill #1

## 2017-11-04 MED FILL — ROSUVASTATIN CALCIUM 10 MG: 10 | 30 days supply | Qty: 30 | Fill #4

## 2017-11-10 MED FILL — OMEPRAZOLE DR 40 MG CAPSULE: 40 | 90 days supply | Qty: 90 | Fill #1

## 2017-11-11 MED FILL — BUPROPION HCL XL 300 MG TAB: 300 | 90 days supply | Qty: 90 | Fill #0

## 2017-11-28 DIAGNOSIS — G454 Transient global amnesia: Secondary | ICD-10-CM | POA: Diagnosis not present

## 2017-11-28 DIAGNOSIS — M5414 Radiculopathy, thoracic region: Secondary | ICD-10-CM | POA: Diagnosis not present

## 2017-11-28 DIAGNOSIS — F439 Reaction to severe stress, unspecified: Secondary | ICD-10-CM | POA: Diagnosis not present

## 2017-11-28 MED FILL — LIDOCAINE PATCH 5%: 5 | 3 days supply | Qty: 3 | Fill #0

## 2017-11-28 MED FILL — valACYclovir HCL 1 GM TABS: 1 | 7 days supply | Qty: 21 | Fill #0

## 2017-12-02 DIAGNOSIS — B029 Zoster without complications: Secondary | ICD-10-CM

## 2017-12-02 HISTORY — DX: Zoster without complications: B02.9

## 2017-12-02 MED FILL — TEMAZEPAM 15 MG CAPSULE: 15 | 30 days supply | Qty: 60 | Fill #2

## 2017-12-03 MED FILL — LIDOCAINE PATCH 5%: 5 | 30 days supply | Qty: 30 | Fill #0

## 2017-12-03 MED FILL — ROSUVASTATIN CALCIUM 10 MG: 10 | 30 days supply | Qty: 30 | Fill #5

## 2017-12-04 MED FILL — GABAPENTIN 100 MG CAPSULE: 100 | 30 days supply | Qty: 90 | Fill #0

## 2017-12-06 ENCOUNTER — Encounter: Payer: Self-pay | Admitting: Neurology

## 2017-12-06 ENCOUNTER — Ambulatory Visit (INDEPENDENT_AMBULATORY_CARE_PROVIDER_SITE_OTHER): Payer: 59 | Admitting: Neurology

## 2017-12-06 VITALS — BP 166/109 | HR 78

## 2017-12-06 DIAGNOSIS — H538 Other visual disturbances: Secondary | ICD-10-CM | POA: Diagnosis not present

## 2017-12-06 DIAGNOSIS — B0229 Other postherpetic nervous system involvement: Secondary | ICD-10-CM | POA: Diagnosis not present

## 2017-12-06 NOTE — Progress Notes (Signed)
PATIENT: Dawn Booker DOB: 08/06/1956  Chief Complaint  Patient presents with  . Postherpetic neuralgia    She is currently taking gabapentin and using lidoderm patches.  . Episode of altered awareness    Further evaluation of one time episode.  Marland Kitchen PCP    Lawerance Cruel, MD     HISTORICAL  Dawn Booker is a 62 year old female, seen in refer by her primary care doctor Lawerance Cruel for evaluation of episode of altered wellness, initial evaluation was on December 06, 2017.  Reviewed and summarized the referring note, depression, taking Wellbutrin 300 mg daily, l-methylfolate 15 mg daily, restorative 50 mg at bedtime, hyperlipidemia, was recently started on Crestor 10 mg daily.  She had a history of chickenpox, on November 26, 2017, she noticed left upper back muscle cramping, by March 28, she noticed worsening radiating pain along her left upper thoracic area, was seen by primary care physician, was diagnosed with shingles, given the prescription of Valtrex 1000 mg 3 times a day, for 10 days,  She continue complains of significant left upper thoracic area radiating pain, constant 3 out of 10 on a daily basis, but occasionally exacerbated to severe radiating pain, gabapentin 100 mg, 5 times a day has been helpful, but despite medication treatment, she continue complains of difficulty sleeping, has slept in a chair over the past 4 days  She tolerated gabapentin 500 mg daily well, also reported improved mood with gabapentin.  She now complains of left shoulder burning sensation, radiating to left arm pit  She was noted to have elevated blood pressure on multiple occasions, today's blood pressure is 170/114, heart rate of 81, there was one episode on November 28, 2017, she was not able to sleep well because of the shingle pain, missed her breakfast, after using bathroom, she stepped out of the bathroom got confused, there was some visual distortion, lasting for a few seconds, when she  was checked right after the event, the blood pressure was noted to be significantly elevated as well.    ALLERGIES: Allergies  Allergen Reactions  . Brintellix [Vortioxetine] Other (See Comments)    Increased anxiety Increased anxiety  . Lamictal [Lamotrigine] Other (See Comments)    Katherina Right Syndrome Katherina Right Syndrome  . Morphine Other (See Comments)    Increase heart rate other Increase heart rate  . Prednisone Other (See Comments)    Rash Rash   . Sulfamethoxazole Other (See Comments)    other  . Sulfonamide Derivatives   . Zoloft [Sertraline] Other (See Comments)    Increased anxiety Increased anxiety  . Other Rash and Other (See Comments)    steriods steriods    HOME MEDICATIONS: Current Outpatient Medications  Medication Sig Dispense Refill  . acetaminophen (TYLENOL) 500 MG tablet Take 500 mg by mouth every 6 (six) hours as needed (sleep).    Marland Kitchen buPROPion (WELLBUTRIN XL) 300 MG 24 hr tablet Take 300 mg by mouth daily.    . cholecalciferol (VITAMIN D) 1000 UNITS tablet Take 2,000 Units by mouth daily.    . diphenhydrAMINE (BENADRYL) 25 mg capsule Take 25 mg by mouth at bedtime.    . gabapentin (NEURONTIN) 100 MG capsule Take 100 mg by mouth. Taking every 5 hours for shingle pain.  0  . ibuprofen (ADVIL,MOTRIN) 600 MG tablet Take 1 tablet (600 mg total) by mouth every 6 (six) hours as needed. 30 tablet 0  . L-Methylfolate 15 MG TABS Take 15 mg by mouth  daily.   1  . lidocaine (LIDODERM) 5 % as needed. Shingles  0  . omeprazole (PRILOSEC) 40 MG capsule Take 40 mg by mouth daily.  4  . temazepam (RESTORIL) 15 MG capsule Take 15 mg by mouth at bedtime.    . valACYclovir (VALTREX) 1000 MG tablet Take 1,000 mg by mouth daily. Shingles  0  . rosuvastatin (CRESTOR) 10 MG tablet Take 1 tablet (10 mg total) by mouth daily. 30 tablet 11   No current facility-administered medications for this visit.     PAST MEDICAL HISTORY: Past Medical History:  Diagnosis  Date  . Acid reflux   . ADD (attention deficit disorder)   . Anxiety   . BIPOLAR DISORDER UNSPECIFIED 10/10/2009   Qualifier: Diagnosis of  By: Deatra Ina MD, Sandy Salaam   . Depression   . Esophageal reflux 10/10/2009   Qualifier: Diagnosis of  By: Deatra Ina MD, Sandy Salaam   . Hyperlipemia, mixed   . Insomnia   . MVP (mitral valve prolapse)   . Shingles     PAST SURGICAL HISTORY: Past Surgical History:  Procedure Laterality Date  . ELBOW SURGERY Left   . PARTIAL HYSTERECTOMY    . THYROIDECTOMY, PARTIAL    . TUBAL LIGATION      FAMILY HISTORY: Family History  Problem Relation Age of Onset  . Hypertension Mother   . Heart attack Mother   . Bipolar disorder Father     SOCIAL HISTORY:  Social History   Socioeconomic History  . Marital status: Married    Spouse name: Not on file  . Number of children: 2  . Years of education: 68  . Highest education level: High school graduate  Occupational History  . Not on file  Social Needs  . Financial resource strain: Not on file  . Food insecurity:    Worry: Not on file    Inability: Not on file  . Transportation needs:    Medical: Not on file    Non-medical: Not on file  Tobacco Use  . Smoking status: Current Every Day Smoker  . Smokeless tobacco: Never Used  Substance and Sexual Activity  . Alcohol use: No  . Drug use: No  . Sexual activity: Not on file  Lifestyle  . Physical activity:    Days per week: Not on file    Minutes per session: Not on file  . Stress: Not on file  Relationships  . Social connections:    Talks on phone: Not on file    Gets together: Not on file    Attends religious service: Not on file    Active member of club or organization: Not on file    Attends meetings of clubs or organizations: Not on file    Relationship status: Not on file  . Intimate partner violence:    Fear of current or ex partner: Not on file    Emotionally abused: Not on file    Physically abused: Not on file    Forced sexual  activity: Not on file  Other Topics Concern  . Not on file  Social History Narrative   Lives at home with husband.   Right-handed.   1 cup coffee per day, occasional soda or tea.     PHYSICAL EXAM   Vitals:   12/06/17 1121  BP: (!) 166/109  Pulse: 78    Not recorded      There is no height or weight on file to calculate BMI.  PHYSICAL EXAMNIATION:  Gen: NAD, conversant, well nourised, obese, well groomed                     Cardiovascular: Regular rate rhythm, no peripheral edema, warm, nontender. Eyes: Conjunctivae clear without exudates or hemorrhage Neck: Supple, no carotid bruits. Pulmonary: Clear to auscultation bilaterally   Skin: Left thoracic area raised erythematous rash,  NEUROLOGICAL EXAM:  MENTAL STATUS: Speech:    Speech is normal; fluent and spontaneous with normal comprehension.  Cognition:     Orientation to time, place and person     Normal recent and remote memory     Normal Attention span and concentration     Normal Language, naming, repeating,spontaneous speech     Fund of knowledge   CRANIAL NERVES: CN II: Visual fields are full to confrontation. Fundoscopic exam is normal with sharp discs and no vascular changes. Pupils are round equal and briskly reactive to light. CN III, IV, VI: extraocular movement are normal. No ptosis. CN V: Facial sensation is intact to pinprick in all 3 divisions bilaterally. Corneal responses are intact.  CN VII: Face is symmetric with normal eye closure and smile. CN VIII: Hearing is normal to rubbing fingers CN IX, X: Palate elevates symmetrically. Phonation is normal. CN XI: Head turning and shoulder shrug are intact CN XII: Tongue is midline with normal movements and no atrophy.  MOTOR: There is no pronator drift of out-stretched arms. Muscle bulk and tone are normal. Muscle strength is normal.  REFLEXES: Reflexes are 2+ and symmetric at the biceps, triceps, knees, and ankles. Plantar responses are  flexor.  SENSORY: Intact to light touch, pinprick, positional sensation and vibratory sensation are intact in fingers and toes.  COORDINATION: Rapid alternating movements and fine finger movements are intact. There is no dysmetria on finger-to-nose and heel-knee-shin.    GAIT/STANCE: Posture is normal. Gait is steady with normal steps, base, arm swing, and turning. Heel and toe walking are normal. Tandem gait is normal.  Romberg is absent.   DIAGNOSTIC DATA (LABS, IMAGING, TESTING) - I reviewed patient records, labs, notes, testing and imaging myself where available.   ASSESSMENT AND PLAN  Dawn Booker is a 62 y.o. female    Left thoracic shingles postherpetic neuralgia  She may take higher dose of gabapentin, 100 mg 2 tablets 3 times a day, extra 3-600 mg at nighttime if needed  Gradually tapering off temazepam  1 single isolated episode of visual distortion, transient confusion   Not most likely related to her significantly elevated blood pressure, dehydration  Marcial Pacas, M.D. Ph.D.  [facility] [address], [place], [place] [address] Ph: [phone] Fax: [phone]  CC: Lawerance Cruel, MD

## 2017-12-09 ENCOUNTER — Encounter: Payer: Self-pay | Admitting: *Deleted

## 2017-12-09 ENCOUNTER — Telehealth: Payer: Self-pay | Admitting: *Deleted

## 2017-12-09 NOTE — Telephone Encounter (Signed)
She is not getting adequate relief with gabapentin 100mg , 2 capsules TID.  Per vo by Dr. Krista Blue, ok to increase gabapentin dosage to 3 capsules TID.  She does not need a new prescription at this time.

## 2017-12-13 DIAGNOSIS — Z6824 Body mass index (BMI) 24.0-24.9, adult: Secondary | ICD-10-CM | POA: Diagnosis not present

## 2017-12-13 DIAGNOSIS — R030 Elevated blood-pressure reading, without diagnosis of hypertension: Secondary | ICD-10-CM | POA: Diagnosis not present

## 2017-12-13 DIAGNOSIS — F411 Generalized anxiety disorder: Secondary | ICD-10-CM | POA: Diagnosis not present

## 2017-12-13 MED FILL — busPIRone HCL 5 MG TABS: 5 | 30 days supply | Qty: 60 | Fill #0

## 2017-12-16 ENCOUNTER — Telehealth: Payer: Self-pay | Admitting: Neurology

## 2017-12-16 MED ORDER — GABAPENTIN 300 MG PO CAPS: ORAL_CAPSULE | ORAL | 0 refills | Status: DC

## 2017-12-16 MED FILL — GABAPENTIN 300 MG CAPSULE: 300 | 22 days supply | Qty: 90 | Fill #0

## 2017-12-16 NOTE — Telephone Encounter (Signed)
Pt has called to relay to RN that on Friday afternoon she started having pain in her ribs.  Pt states its hurts when she breaths.  Pt needs a RX for Gabapentin and she would like to discuss FMLA please call

## 2017-12-16 NOTE — Telephone Encounter (Signed)
Per vo by Dr. Krista Blue, she is provide a prescription for an increased dose of gabapentin 300mg , one capsule in am, one capsule midday and two capsules at bedtime.  Additionally, she will complete FMLA ppw when received.  Dawn Booker is in agreement with the above plan.

## 2017-12-16 NOTE — Addendum Note (Signed)
Addended by: Noberto Retort C on: 12/16/2017 11:37 AM   Modules accepted: Orders

## 2017-12-17 ENCOUNTER — Other Ambulatory Visit: Payer: Self-pay | Admitting: *Deleted

## 2017-12-17 ENCOUNTER — Encounter: Payer: Self-pay | Admitting: *Deleted

## 2017-12-17 MED ORDER — LIDOCAINE 5 % EX PTCH: 2.0000 | MEDICATED_PATCH | CUTANEOUS | 3 refills | Status: DC

## 2017-12-17 MED FILL — LIDOCAINE PATCH 5%: 5 | 30 days supply | Qty: 60 | Fill #0

## 2017-12-23 ENCOUNTER — Telehealth: Payer: Self-pay | Admitting: *Deleted

## 2017-12-23 MED ORDER — GABAPENTIN 300 MG PO CAPS: ORAL_CAPSULE | ORAL | 5 refills | Status: DC

## 2017-12-23 NOTE — Telephone Encounter (Signed)
On 12/16/17, Dr. Krista Blue discussed an increased dose of gabapentin 300mg , one capsule in am, one capsule midday and two capsules at bedtime in attempt to get her postherpetic neuralgia under better control.    She is still reporting a continuation of significant pain.  For the last few days, she has been taking gabapentin 300mg  on the following schedule:  2 caps in am, 2 caps midday, 2 caps in evening, 3 caps at bedtime.  States this new dosing is controlling her pain.  She is requesting a refill for the amount to cover this new amount.

## 2017-12-23 NOTE — Telephone Encounter (Signed)
The patient apparently is better controlled with her pain taking 2700 mg of gabapentin daily, I will send in a prescription for this.

## 2017-12-23 NOTE — Addendum Note (Signed)
Addended by: Kathrynn Ducking on: 12/23/2017 05:08 PM   Modules accepted: Orders

## 2017-12-24 MED FILL — GABAPENTIN 300 MG CAPSULE: 300 | 30 days supply | Qty: 270 | Fill #0

## 2018-01-05 MED FILL — ROSUVASTATIN CALCIUM 10 MG: 10 | 30 days supply | Qty: 30 | Fill #6

## 2018-01-06 MED FILL — TEMAZEPAM 30 MG CAPSULE: 30 | 30 days supply | Qty: 30 | Fill #0

## 2018-01-13 MED FILL — busPIRone HCL 5 MG TABS: 5 | 90 days supply | Qty: 180 | Fill #0

## 2018-01-21 MED FILL — GABAPENTIN 300 MG CAPSULE: 300 | 30 days supply | Qty: 270 | Fill #1

## 2018-01-21 MED FILL — LIDOCAINE PATCH 5%: 5 | 30 days supply | Qty: 60 | Fill #1

## 2018-02-03 MED FILL — TEMAZEPAM 30 MG CAPSULE: 30 | 30 days supply | Qty: 30 | Fill #1

## 2018-02-03 MED FILL — ROSUVASTATIN CALCIUM 10 MG: 10 | 30 days supply | Qty: 30 | Fill #7

## 2018-02-09 ENCOUNTER — Other Ambulatory Visit: Payer: Self-pay | Admitting: Family Medicine

## 2018-02-09 DIAGNOSIS — Z1231 Encounter for screening mammogram for malignant neoplasm of breast: Secondary | ICD-10-CM

## 2018-02-09 DIAGNOSIS — M8580 Other specified disorders of bone density and structure, unspecified site: Secondary | ICD-10-CM

## 2018-02-11 MED FILL — OMEPRAZOLE 40 MG CPDR: 40 | 90 days supply | Qty: 90 | Fill #2

## 2018-02-11 MED FILL — buPROPion HCL ER (XL) 300 M: 300 | 90 days supply | Qty: 90 | Fill #0

## 2018-02-28 DIAGNOSIS — B0229 Other postherpetic nervous system involvement: Secondary | ICD-10-CM | POA: Diagnosis not present

## 2018-02-28 DIAGNOSIS — F322 Major depressive disorder, single episode, severe without psychotic features: Secondary | ICD-10-CM | POA: Diagnosis not present

## 2018-02-28 DIAGNOSIS — Z0000 Encounter for general adult medical examination without abnormal findings: Secondary | ICD-10-CM | POA: Diagnosis not present

## 2018-02-28 DIAGNOSIS — F411 Generalized anxiety disorder: Secondary | ICD-10-CM | POA: Diagnosis not present

## 2018-02-28 DIAGNOSIS — G479 Sleep disorder, unspecified: Secondary | ICD-10-CM | POA: Diagnosis not present

## 2018-02-28 DIAGNOSIS — K219 Gastro-esophageal reflux disease without esophagitis: Secondary | ICD-10-CM | POA: Diagnosis not present

## 2018-02-28 DIAGNOSIS — E782 Mixed hyperlipidemia: Secondary | ICD-10-CM | POA: Diagnosis not present

## 2018-02-28 DIAGNOSIS — R5383 Other fatigue: Secondary | ICD-10-CM | POA: Diagnosis not present

## 2018-02-28 LAB — LIPID PANEL
Cholesterol: 142 (ref 0–200)
HDL: 62 (ref 35–70)
LDL Cholesterol: 59
Triglycerides: 104 (ref 40–160)

## 2018-02-28 MED FILL — ROSUVASTATIN CALCIUM 10 MG: 10 | 90 days supply | Qty: 90 | Fill #0

## 2018-02-28 MED FILL — raNITIdine HCL 150 MG TABS: 150 | 30 days supply | Qty: 60 | Fill #0

## 2018-03-03 LAB — BASIC METABOLIC PANEL
BUN: 11 (ref 4–21)
BUN: 11 (ref 4–21)
Creatinine: 0.8 (ref 0.5–1.1)
Creatinine: 0.8 (ref 0.5–1.1)
Glucose: 86
Glucose: 86
Potassium: 4.4 (ref 3.4–5.3)
Potassium: 4.4 (ref 3.4–5.3)
Sodium: 141 (ref 137–147)
Sodium: 141 (ref 137–147)

## 2018-03-03 LAB — HEPATIC FUNCTION PANEL
ALT: 18 (ref 7–35)
AST: 14 (ref 13–35)
Alkaline Phosphatase: 587 — AB (ref 25–125)
Bilirubin, Total: 0.5

## 2018-03-03 LAB — CBC AND DIFFERENTIAL
HCT: 41 (ref 36–46)
HCT: 41 (ref 36–46)
Hemoglobin: 13.8 (ref 12.0–16.0)
Hemoglobin: 13.8 (ref 12.0–16.0)
Platelets: 169 (ref 150–399)
WBC: 4.6
WBC: 4.6

## 2018-03-03 LAB — VITAMIN D 25 HYDROXY (VIT D DEFICIENCY, FRACTURES)
Vit D, 25-Hydroxy: 38.7
Vit D, 25-Hydroxy: 38.7

## 2018-03-03 LAB — TSH: TSH: 2.88 (ref 0.41–5.90)

## 2018-03-03 MED FILL — TEMAZEPAM 30 MG CAPSULE: 30 | 30 days supply | Qty: 30 | Fill #0

## 2018-03-05 LAB — LIPID PANEL
Cholesterol: 142 (ref 0–200)
HDL: 62 (ref 35–70)
LDL Cholesterol: 59
Triglycerides: 104 (ref 40–160)

## 2018-03-30 MED FILL — TEMAZEPAM 30 MG CAPSULE: 30 | 30 days supply | Qty: 30 | Fill #1

## 2018-04-11 ENCOUNTER — Ambulatory Visit: Payer: 59

## 2018-04-11 ENCOUNTER — Other Ambulatory Visit: Payer: 59

## 2018-04-25 ENCOUNTER — Ambulatory Visit
Admission: RE | Admit: 2018-04-25 | Discharge: 2018-04-25 | Disposition: A | Discharge status: Home / Self Care | Payer: 59 | Source: Ambulatory Visit | Attending: Family Medicine | Admitting: Family Medicine

## 2018-04-25 DIAGNOSIS — Z780 Asymptomatic menopausal state: Secondary | ICD-10-CM | POA: Diagnosis not present

## 2018-04-25 DIAGNOSIS — Z1231 Encounter for screening mammogram for malignant neoplasm of breast: Secondary | ICD-10-CM

## 2018-04-25 DIAGNOSIS — M810 Age-related osteoporosis without current pathological fracture: Secondary | ICD-10-CM | POA: Diagnosis not present

## 2018-04-25 DIAGNOSIS — M8580 Other specified disorders of bone density and structure, unspecified site: Secondary | ICD-10-CM

## 2018-04-28 MED FILL — GABAPENTIN 300 MG CAPSULE: 300 | 90 days supply | Qty: 270 | Fill #0

## 2018-04-28 MED FILL — TEMAZEPAM 30 MG CAPSULE: 30 | 30 days supply | Qty: 30 | Fill #2

## 2018-04-29 ENCOUNTER — Other Ambulatory Visit: Payer: Self-pay | Admitting: Family Medicine

## 2018-04-29 DIAGNOSIS — R928 Other abnormal and inconclusive findings on diagnostic imaging of breast: Secondary | ICD-10-CM

## 2018-05-01 ENCOUNTER — Ambulatory Visit
Admission: RE | Admit: 2018-05-01 | Discharge: 2018-05-01 | Disposition: A | Discharge status: Home / Self Care | Payer: 59 | Source: Ambulatory Visit | Attending: Family Medicine | Admitting: Family Medicine

## 2018-05-01 ENCOUNTER — Other Ambulatory Visit: Payer: Self-pay | Admitting: Family Medicine

## 2018-05-01 DIAGNOSIS — N6322 Unspecified lump in the left breast, upper inner quadrant: Secondary | ICD-10-CM | POA: Diagnosis not present

## 2018-05-01 DIAGNOSIS — R928 Other abnormal and inconclusive findings on diagnostic imaging of breast: Secondary | ICD-10-CM

## 2018-05-01 DIAGNOSIS — N6320 Unspecified lump in the left breast, unspecified quadrant: Secondary | ICD-10-CM

## 2018-05-06 ENCOUNTER — Ambulatory Visit
Admission: RE | Admit: 2018-05-06 | Discharge: 2018-05-06 | Disposition: A | Discharge status: Home / Self Care | Payer: 59 | Source: Ambulatory Visit | Attending: Family Medicine | Admitting: Family Medicine

## 2018-05-06 ENCOUNTER — Other Ambulatory Visit: Payer: Self-pay | Admitting: Family Medicine

## 2018-05-06 DIAGNOSIS — N6320 Unspecified lump in the left breast, unspecified quadrant: Secondary | ICD-10-CM

## 2018-05-06 DIAGNOSIS — C50412 Malignant neoplasm of upper-outer quadrant of left female breast: Secondary | ICD-10-CM | POA: Diagnosis not present

## 2018-05-06 DIAGNOSIS — C50212 Malignant neoplasm of upper-inner quadrant of left female breast: Secondary | ICD-10-CM | POA: Diagnosis not present

## 2018-05-06 DIAGNOSIS — R590 Localized enlarged lymph nodes: Secondary | ICD-10-CM | POA: Diagnosis not present

## 2018-05-06 DIAGNOSIS — N6322 Unspecified lump in the left breast, upper inner quadrant: Secondary | ICD-10-CM | POA: Diagnosis not present

## 2018-05-06 DIAGNOSIS — N6321 Unspecified lump in the left breast, upper outer quadrant: Secondary | ICD-10-CM | POA: Diagnosis not present

## 2018-05-06 DIAGNOSIS — Z170 Estrogen receptor positive status [ER+]: Secondary | ICD-10-CM

## 2018-05-06 DIAGNOSIS — C50812 Malignant neoplasm of overlapping sites of left female breast: Secondary | ICD-10-CM

## 2018-05-06 DIAGNOSIS — C773 Secondary and unspecified malignant neoplasm of axilla and upper limb lymph nodes: Secondary | ICD-10-CM | POA: Diagnosis not present

## 2018-05-06 HISTORY — DX: Estrogen receptor positive status (ER+): Z17.0

## 2018-05-06 HISTORY — DX: Estrogen receptor positive status (ER+): C50.812

## 2018-05-08 ENCOUNTER — Telehealth: Payer: Self-pay | Admitting: Hematology and Oncology

## 2018-05-08 NOTE — Telephone Encounter (Signed)
Spoke with patient to confirm afternoon [facility] appointment for 9/18, packet will be e-mailed to patient

## 2018-05-09 ENCOUNTER — Encounter: Payer: Self-pay | Admitting: *Deleted

## 2018-05-09 DIAGNOSIS — Z170 Estrogen receptor positive status [ER+]: Secondary | ICD-10-CM | POA: Insufficient documentation

## 2018-05-09 DIAGNOSIS — C50812 Malignant neoplasm of overlapping sites of left female breast: Secondary | ICD-10-CM

## 2018-05-14 DIAGNOSIS — Z01419 Encounter for gynecological examination (general) (routine) without abnormal findings: Secondary | ICD-10-CM | POA: Diagnosis not present

## 2018-05-14 DIAGNOSIS — Z6825 Body mass index (BMI) 25.0-25.9, adult: Secondary | ICD-10-CM | POA: Diagnosis not present

## 2018-05-21 ENCOUNTER — Ambulatory Visit: Payer: Self-pay | Admitting: General Surgery

## 2018-05-21 ENCOUNTER — Encounter: Payer: Self-pay | Admitting: Radiation Oncology

## 2018-05-21 ENCOUNTER — Inpatient Hospital Stay: Payer: 59 | Attending: Hematology and Oncology | Admitting: Hematology and Oncology

## 2018-05-21 ENCOUNTER — Encounter: Payer: Self-pay | Admitting: Hematology and Oncology

## 2018-05-21 ENCOUNTER — Inpatient Hospital Stay: Payer: 59

## 2018-05-21 ENCOUNTER — Other Ambulatory Visit: Payer: Self-pay

## 2018-05-21 ENCOUNTER — Encounter: Payer: Self-pay | Admitting: Physical Therapy

## 2018-05-21 ENCOUNTER — Ambulatory Visit: Payer: 59 | Attending: General Surgery | Admitting: Physical Therapy

## 2018-05-21 ENCOUNTER — Ambulatory Visit
Admission: RE | Admit: 2018-05-21 | Discharge: 2018-05-21 | Disposition: A | Discharge status: Home / Self Care | Payer: 59 | Source: Ambulatory Visit | Attending: Radiation Oncology | Admitting: Radiation Oncology

## 2018-05-21 ENCOUNTER — Telehealth: Payer: Self-pay | Admitting: Hematology and Oncology

## 2018-05-21 DIAGNOSIS — Z803 Family history of malignant neoplasm of breast: Secondary | ICD-10-CM | POA: Diagnosis not present

## 2018-05-21 DIAGNOSIS — Z170 Estrogen receptor positive status [ER+]: Secondary | ICD-10-CM | POA: Insufficient documentation

## 2018-05-21 DIAGNOSIS — C50112 Malignant neoplasm of central portion of left female breast: Secondary | ICD-10-CM | POA: Diagnosis not present

## 2018-05-21 DIAGNOSIS — Z87891 Personal history of nicotine dependence: Secondary | ICD-10-CM | POA: Insufficient documentation

## 2018-05-21 DIAGNOSIS — R293 Abnormal posture: Secondary | ICD-10-CM | POA: Insufficient documentation

## 2018-05-21 DIAGNOSIS — C50812 Malignant neoplasm of overlapping sites of left female breast: Secondary | ICD-10-CM

## 2018-05-21 DIAGNOSIS — C773 Secondary and unspecified malignant neoplasm of axilla and upper limb lymph nodes: Secondary | ICD-10-CM | POA: Diagnosis not present

## 2018-05-21 LAB — CBC WITH DIFFERENTIAL (CANCER CENTER ONLY)
Basophils Absolute: 0 10*3/uL (ref 0.0–0.1)
Basophils Relative: 0 %
Eosinophils Absolute: 0.1 10*3/uL (ref 0.0–0.5)
Eosinophils Relative: 1 %
HCT: 43.6 % (ref 34.8–46.6)
Hemoglobin: 14.6 g/dL (ref 11.6–15.9)
Lymphocytes Relative: 30 %
Lymphs Abs: 1.9 10*3/uL (ref 0.9–3.3)
MCH: 31.7 pg (ref 25.1–34.0)
MCHC: 33.4 g/dL (ref 31.5–36.0)
MCV: 94.8 fL (ref 79.5–101.0)
Monocytes Absolute: 0.3 10*3/uL (ref 0.1–0.9)
Monocytes Relative: 5 %
Neutro Abs: 4.1 10*3/uL (ref 1.5–6.5)
Neutrophils Relative %: 64 %
Platelet Count: 176 10*3/uL (ref 145–400)
RBC: 4.6 MIL/uL (ref 3.70–5.45)
RDW: 12.7 % (ref 11.2–14.5)
WBC Count: 6.5 10*3/uL (ref 3.9–10.3)

## 2018-05-21 LAB — CMP (CANCER CENTER ONLY)
ALT: 17 U/L (ref 0–44)
AST: 16 U/L (ref 15–41)
Albumin: 4.5 g/dL (ref 3.5–5.0)
Alkaline Phosphatase: 73 U/L (ref 38–126)
Anion gap: 9 (ref 5–15)
BUN: 10 mg/dL (ref 8–23)
CO2: 28 mmol/L (ref 22–32)
Calcium: 9.5 mg/dL (ref 8.9–10.3)
Chloride: 105 mmol/L (ref 98–111)
Creatinine: 0.94 mg/dL (ref 0.44–1.00)
GFR, Est AFR Am: >60 mL/min (ref >60)
GFR, Estimated: >60 mL/min (ref >60)
Glucose, Bld: 87 mg/dL (ref 70–99)
Potassium: 3.8 mmol/L (ref 3.5–5.1)
Sodium: 142 mmol/L (ref 135–145)
Total Bilirubin: 0.5 mg/dL (ref 0.3–1.2)
Total Protein: 7.4 g/dL (ref 6.5–8.1)

## 2018-05-21 NOTE — Progress Notes (Signed)
[facility] NOTE  Patient Care Team: Avon Gully, NP as PCP - General (Obstetrics and Gynecology) Nicholas Lose, MD as Consulting Physician (Hematology and Oncology) Jovita Kussmaul, MD as Consulting Physician (General Surgery) Eppie Gibson, MD as Attending Physician (Radiation Oncology)  CHIEF COMPLAINTS/PURPOSE OF CONSULTATION:  Newly diagnosed breast cancer  HISTORY OF PRESENTING ILLNESS:  Dawn Booker 62 y.o. female is here because of recent diagnosis of left breast cancer.  Patient had a routine screening mammogram that detected 3 masses 6 mm, 7 mm and a 2.6 cm.  Biopsy of the 2.6 cm mass as well as the 7 mm mass revealed similar-appearing grade 2 IDC was also positive for the lymph node.  The tumor was estrogen receptor positive progesterone receptor weak and Ki-67 15%.  I reviewed her records extensively and collaborated the history with the patient.  SUMMARY OF ONCOLOGIC HISTORY:   Malignant neoplasm of overlapping sites of left breast in female, estrogen receptor positive ([place])   05/06/2018 Initial Diagnosis    Screening detected left breast mass anteriorly 1030 to 11 o'clock position 2 masses 6 mm and 7 mm, 12:30 position 2.6 cm both of these masses biopsy-proven grade 2 invasive lobular cancer ER 100%, PR 10%, Ki-67 15%, HER-2 negative 1+ by IHC, lymph node positive for malignancy T2N1 stage IIa clinical stage    MEDICAL HISTORY:  Past Medical History:  Diagnosis Date  . Acid reflux   . ADD (attention deficit disorder)   . Anxiety   . BIPOLAR DISORDER UNSPECIFIED 10/10/2009   Qualifier: Diagnosis of  By: Deatra Ina MD, Sandy Salaam   . Depression   . Esophageal reflux 10/10/2009   Qualifier: Diagnosis of  By: Deatra Ina MD, Sandy Salaam   . Hyperlipemia, mixed   . Insomnia   . MVP (mitral valve prolapse)   . Shingles     SURGICAL HISTORY: Past Surgical History:  Procedure Laterality Date  . BREAST EXCISIONAL BIOPSY Left 1995   benign  . ELBOW SURGERY  Left   . PARTIAL HYSTERECTOMY    . THYROIDECTOMY, PARTIAL    . TUBAL LIGATION      SOCIAL HISTORY: Social History   Socioeconomic History  . Marital status: Married    Spouse name: Not on file  . Number of children: 2  . Years of education: 39  . Highest education level: High school graduate  Occupational History  . Not on file  Social Needs  . Financial resource strain: Not on file  . Food insecurity:    Worry: Not on file    Inability: Not on file  . Transportation needs:    Medical: Not on file    Non-medical: Not on file  Tobacco Use  . Smoking status: Former Research scientist (life sciences)  . Smokeless tobacco: Never Used  Substance and Sexual Activity  . Alcohol use: Yes  . Drug use: No  . Sexual activity: Not on file  Lifestyle  . Physical activity:    Days per week: Not on file    Minutes per session: Not on file  . Stress: Not on file  Relationships  . Social connections:    Talks on phone: Not on file    Gets together: Not on file    Attends religious service: Not on file    Active member of club or organization: Not on file    Attends meetings of clubs or organizations: Not on file    Relationship status: Not on file  . Intimate partner violence:  Fear of current or ex partner: Not on file    Emotionally abused: Not on file    Physically abused: Not on file    Forced sexual activity: Not on file  Other Topics Concern  . Not on file  Social History Narrative   Lives at home with husband.   Right-handed.   1 cup coffee per day, occasional soda or tea.    FAMILY HISTORY: Family History  Problem Relation Age of Onset  . Hypertension Mother   . Heart attack Mother   . Breast cancer Mother   . Bipolar disorder Father   . Breast cancer Maternal Grandmother   . Breast cancer Cousin     ALLERGIES:  is allergic to brintellix [vortioxetine]; lamictal [lamotrigine]; morphine; prednisone; sulfamethoxazole; sulfonamide derivatives; zoloft [sertraline]; and  other.  MEDICATIONS:  Current Outpatient Medications  Medication Sig Dispense Refill  . acetaminophen (TYLENOL) 500 MG tablet Take 500 mg by mouth every 6 (six) hours as needed (sleep).    Marland Kitchen buPROPion (WELLBUTRIN XL) 300 MG 24 hr tablet Take 150 mg by mouth daily.     . cholecalciferol (VITAMIN D) 1000 UNITS tablet Take 2,000 Units by mouth daily.    . diphenhydrAMINE (BENADRYL) 25 mg capsule Take 25 mg by mouth at bedtime.    . gabapentin (NEURONTIN) 300 MG capsule 2 capsules 3 times daily, 3 capsules at night 270 capsule 5  . ibuprofen (ADVIL,MOTRIN) 600 MG tablet Take 1 tablet (600 mg total) by mouth every 6 (six) hours as needed. 30 tablet 0  . omeprazole (PRILOSEC) 40 MG capsule Take 40 mg by mouth daily.  4  . rosuvastatin (CRESTOR) 10 MG tablet Take 1 tablet (10 mg total) by mouth daily. 30 tablet 11  . temazepam (RESTORIL) 15 MG capsule Take 15 mg by mouth at bedtime.     No current facility-administered medications for this visit.     REVIEW OF SYSTEMS:   Constitutional: Denies fevers, chills or abnormal night sweats Eyes: Denies blurriness of vision, double vision or watery eyes Ears, nose, mouth, throat, and face: Denies mucositis or sore throat Respiratory: Denies cough, dyspnea or wheezes Cardiovascular: Denies palpitation, chest discomfort or lower extremity swelling Gastrointestinal:  Denies nausea, heartburn or change in bowel habits Skin: Denies abnormal skin rashes Lymphatics: Denies new lymphadenopathy or easy bruising Neurological:Denies numbness, tingling or new weaknesses Behavioral/Psych: Mood is stable, no new changes  Breast:  Denies any palpable lumps or discharge All other systems were reviewed with the patient and are negative.  PHYSICAL EXAMINATION: ECOG PERFORMANCE STATUS: 1 - Symptomatic but completely ambulatory  Vitals:   05/21/18 1304  BP: (!) 143/92  Pulse: 82  Resp: 17  Temp: 98 F (36.7 C)  SpO2: 100%   Filed Weights   05/21/18 1304   Weight: 150 lb 8 oz (68.3 kg)    GENERAL:alert, no distress and comfortable SKIN: skin color, texture, turgor are normal, no rashes or significant lesions EYES: normal, conjunctiva are pink and non-injected, sclera clear OROPHARYNX:no exudate, no erythema and lips, buccal mucosa, and tongue normal  NECK: supple, thyroid normal size, non-tender, without nodularity LYMPH:  no palpable lymphadenopathy in the cervical, axillary or inguinal LUNGS: clear to auscultation and percussion with normal breathing effort HEART: regular rate & rhythm and no murmurs and no lower extremity edema ABDOMEN:abdomen soft, non-tender and normal bowel sounds Musculoskeletal:no cyanosis of digits and no clubbing  PSYCH: alert & oriented x 3 with fluent speech NEURO: no focal motor/sensory deficits BREAST: No  palpable nodules in breast. No palpable axillary or supraclavicular lymphadenopathy (exam performed in the presence of a chaperone)   LABORATORY DATA:  I have reviewed the data as listed Lab Results  Component Value Date   WBC 6.5 05/21/2018   HGB 14.6 05/21/2018   HCT 43.6 05/21/2018   MCV 94.8 05/21/2018   PLT 176 05/21/2018   Lab Results  Component Value Date   NA 142 05/21/2018   K 3.8 05/21/2018   CL 105 05/21/2018   CO2 28 05/21/2018    RADIOGRAPHIC STUDIES: I have personally reviewed the radiological reports and agreed with the findings in the report.  ASSESSMENT AND PLAN:  Malignant neoplasm of overlapping sites of left breast in female, estrogen receptor positive ([place]) 05/06/2018:Screening detected left breast mass anteriorly 1030 to 11 o'clock position 2 masses 6 mm and 7 mm, 12:30 position 2.6 cm both of these masses biopsy-proven grade 2 invasive lobular cancer ER 100%, PR 10%, Ki-67 15%, HER-2 negative 1+ by IHC, lymph node positive for malignancy T2N1 stage IIa clinical stage  Pathology and radiology counseling: Discussed with the patient, the details of pathology including the  type of breast cancer,the clinical staging, the significance of ER, PR and HER-2/neu receptors and the implications for treatment. After reviewing the pathology in detail, we proceeded to discuss the different treatment options between surgery, radiation, chemotherapy, antiestrogen therapies.  Recommendation: 1.  Breast conserving surgery with targeted lymph node dissection 2. breast MRI 3.  Mammaprint testing and the final pathology 4.  Adjuvant radiation therapy 5.  Adjuvant antiestrogen therapy with letrozole 2.5 mg daily x5 to 7 years  Mammaprint counseling: MINDACT is a prospective, randomized phase III controlled trial that investigates the clinical utility of MammaPrint, when compared to standard clinical pathological criteria, with 6,693 patients enrolled from over 111 institutions. Clinical high-risk patients with a Low Risk MammaPrint result, including 48% node-positive, had 5-year distant metastasis-free survival rate in excess of 94 percent, whether randomized to receive adjuvant chemotherapy or not proving MammaPrint's ability to safely identify Low Risk patients.  Patient had genetics done and brought a copy of the report.  We will send it to the genetic counselors to see if anything else is to be done. Return to clinic to decide if Mammaprint needs to be sent   All questions were answered. The patient knows to call the clinic with any problems, questions or concerns.    Harriette Ohara, MD 05/21/18

## 2018-05-21 NOTE — Progress Notes (Signed)
Nutrition Assessment  Reason for Assessment:  Pt seen in Breast Clinic  ASSESSMENT:   62 year old female with new diagnosis of breast cancer.  Past medical history of GERD, bipolar  Patient reports that she eats horribly, sometimes ice cream for dinner  Medications:  reviewed  Labs: reviewed  Anthropometrics:   Height: 65.5 inches Weight: 150 lb 8 oz BMI: 24   NUTRITION DIAGNOSIS: Food and nutrition related knowledge deficit related to new diagnosis of breast cancer as evidenced by no prior need for nutrition related information.  INTERVENTION:   Discussed and provided packet of information regarding nutritional tips for breast cancer patients.  Questions answered.  Teachback method used.  Contact information provided and patient knows to contact me with questions/concerns.    MONITORING, EVALUATION, and GOAL: Pt will consume a healthy plant based diet to maintain lean body mass throughout treatment.   Roman Sandall B. Zenia Resides, [place], [place] Registered Dietitian [phone] (pager)

## 2018-05-21 NOTE — Telephone Encounter (Signed)
Per 9/18 no los °

## 2018-05-21 NOTE — Addendum Note (Signed)
Encounter addended by: Eppie Gibson, MD on: 05/21/2018 5:58 PM  Actions taken: Sign clinical note

## 2018-05-21 NOTE — Progress Notes (Addendum)
Radiation Oncology         [phone] ________________________________  Initial outpatient Consultation  Name: Dawn Booker MRN: [id number]  Date: 05/21/2018  DOB: 1955-09-20  LD:JTTS, Benjamine Mola, NP  Jovita Kussmaul, MD   REFERRING PHYSICIAN: [name], MD  DIAGNOSIS: Cancer Staging Malignant neoplasm of overlapping sites of left breast in female, estrogen receptor positive [facility]) Staging form: Breast, AJCC 8th Edition - Clinical stage from 05/21/2018: Stage IIA (cT2, cN1, cM0, G2, ER+, PR+, HER2-) - Unsigned - Pathologic: No stage assigned - Unsigned    ICD-10-CM   1. Malignant neoplasm of overlapping sites of left breast in female, estrogen receptor positive ([place]) C50.812    Z17.0      CHIEF COMPLAINT: Here to discuss management of left breast cancer  HISTORY OF PRESENT ILLNESS::Dawn Booker is a lovely 63 y.o. female who presented with breast abnormality on the following imaging: Screening mammography, left breast, revealing a mass in the anterior breast.  She has a positive family history and underwent genetic testing in the past which was negative   Ultrasound of breast revealed a 10:30 o'clock 6 mm mass which was biopsied and showed grade 2 invasive lobular carcinoma; there was also a 7 mm mass at 11:00 which was not biopsied but close by.  At 12:30 a 2.6 cm mass was biopsied and histology was similar to the 10:30 mass.  An abnormal lymph node was noted on ultrasound in the axilla and this was biopsied and positive for metastatic carcinoma.  Her cancer is ER / PR positive HER-2 negative  Review of systems is positive for chills, night sweats ,fatigue, pain in the left abdomen, hearing loss ,ringing in ears, sinus problems, hoarse voice ,burning tongue, palpitations ,ankle swelling, shortness of breath with stairs, dry cough, nausea, abdominal pain, rash, easy bruising ,back pain ,joint pain, forgetfulness, anxiety, depression, thyroid problem, hot flashes  PREVIOUS  RADIATION THERAPY: No  PAST MEDICAL HISTORY:  has a past medical history of Acid reflux, ADD (attention deficit disorder), Anxiety, BIPOLAR DISORDER UNSPECIFIED (10/10/2009), Depression, Esophageal reflux (10/10/2009), Hyperlipemia, mixed, Insomnia, MVP (mitral valve prolapse), and Shingles.    PAST SURGICAL HISTORY: Past Surgical History:  Procedure Laterality Date  . BREAST EXCISIONAL BIOPSY Left 1995   benign  . ELBOW SURGERY Left   . PARTIAL HYSTERECTOMY    . THYROIDECTOMY, PARTIAL    . TUBAL LIGATION      FAMILY HISTORY: family history includes Bipolar disorder in her father; Breast cancer in her cousin, maternal grandmother, and mother; Heart attack in her mother; Hypertension in her mother.  SOCIAL HISTORY:  reports that she has been smoking. She has never used smokeless tobacco. She reports that she does not drink alcohol or use drugs.  ALLERGIES: Brintellix [vortioxetine]; Lamictal [lamotrigine]; Morphine; Prednisone; Sulfamethoxazole; Sulfonamide derivatives; Zoloft [sertraline]; and Other  MEDICATIONS:  Current Outpatient Medications  Medication Sig Dispense Refill  . acetaminophen (TYLENOL) 500 MG tablet Take 500 mg by mouth every 6 (six) hours as needed (sleep).    Marland Kitchen buPROPion (WELLBUTRIN XL) 300 MG 24 hr tablet Take 300 mg by mouth daily.    . cholecalciferol (VITAMIN D) 1000 UNITS tablet Take 2,000 Units by mouth daily.    . diphenhydrAMINE (BENADRYL) 25 mg capsule Take 25 mg by mouth at bedtime.    . gabapentin (NEURONTIN) 300 MG capsule 2 capsules 3 times daily, 3 capsules at night 270 capsule 5  . ibuprofen (ADVIL,MOTRIN) 600 MG tablet Take 1 tablet (600 mg total)  by mouth every 6 (six) hours as needed. 30 tablet 0  . L-Methylfolate 15 MG TABS Take 15 mg by mouth daily.   1  . lidocaine (LIDODERM) 5 % Place 2 patches onto the skin daily. Shingles 60 patch 3  . omeprazole (PRILOSEC) 40 MG capsule Take 40 mg by mouth daily.  4  . rosuvastatin (CRESTOR) 10 MG tablet Take 1  tablet (10 mg total) by mouth daily. 30 tablet 11  . temazepam (RESTORIL) 15 MG capsule Take 15 mg by mouth at bedtime.    . valACYclovir (VALTREX) 1000 MG tablet Take 1,000 mg by mouth daily. Shingles  0   No current facility-administered medications for this visit.     REVIEW OF SYSTEMS: A 10+ POINT REVIEW OF SYSTEMS WAS OBTAINED including neurology, dermatology, psychiatry, cardiac, respiratory, lymph, extremities, GI, GU, Musculoskeletal, constitutional, breasts, reproductive, HEENT.  All pertinent positives are noted in the HPI.  All others are negative.   PHYSICAL EXAM:  Vitals with Age-Percentiles 05/21/2018  Length 161.0 cm  Systolic 960  Diastolic 92  MAP   Pulse 82  Respiration 17  Weight 68.266 kg  BMI 24.65  VISIT REPORT    General: Alert and oriented, in no acute distress HEENT: Head is normocephalic. Extraocular movements are intact. Oropharynx is clear. Neck: Neck is supple, no palpable cervical or supraclavicular lymphadenopathy. Heart: Regular in rate and rhythm with no murmurs, rubs, or gallops. Chest: Clear to auscultation bilaterally, with no rhonchi, wheezes, or rales. Abdomen: Soft, nontender, nondistended, with no rigidity or guarding. Extremities: No cyanosis or edema. Lymphatics: see Neck Exam Skin: No concerning lesions. Musculoskeletal: symmetric strength and muscle tone throughout. Neurologic: Cranial nerves II through XII are grossly intact. No obvious focalities. Speech is fluent. Coordination is intact. Psychiatric: Judgment and insight are intact. Affect is appropriate. Breasts: Postbiopsy swelling in the upper outer quadrant of the left breast. No other palpable masses appreciated in the breasts or axillae laterally.  ECOG = 0  0 - Asymptomatic (Fully active, able to carry on all predisease activities without restriction)  1 - Symptomatic but completely ambulatory (Restricted in physically strenuous activity but ambulatory and able to carry out  work of a light or sedentary nature. For example, light housework, office work)  2 - Symptomatic, <50% in bed during the day (Ambulatory and capable of all self care but unable to carry out any work activities. Up and about more than 50% of waking hours)  3 - Symptomatic, >50% in bed, but not bedbound (Capable of only limited self-care, confined to bed or chair 50% or more of waking hours)  4 - Bedbound (Completely disabled. Cannot carry on any self-care. Totally confined to bed or chair)  5 - Death   Eustace Pen MM, Creech RH, Tormey DC, et al. [phone]). "Toxicity and response criteria of the [facility] Group". [place] Oncol. 5 (6): 649-55   LABORATORY DATA:  Lab Results  Component Value Date   WBC 5.3 09/30/2013   HGB 13.4 09/30/2013   HCT 38.6 09/30/2013   MCV 91.9 09/30/2013   PLT 202 09/30/2013   CMP     Component Value Date/Time   NA 142 09/30/2013 1351   K 3.7 09/30/2013 1351   CL 105 09/30/2013 1351   CO2 24 09/30/2013 1351   GLUCOSE 95 09/30/2013 1351   BUN 13 09/30/2013 1351   CREATININE 0.61 09/30/2013 1351   CALCIUM 9.0 09/30/2013 1351   PROT 7.2 09/30/2013 1351   ALBUMIN 4.3  09/30/2013 1351   AST 16 09/30/2013 1351   ALT 15 09/30/2013 1351   ALKPHOS 55 09/30/2013 1351   BILITOT 0.3 09/30/2013 1351   GFRNONAA >90 09/30/2013 1351   GFRAA >90 09/30/2013 1351         RADIOGRAPHY: Dg Bone Density (dxa)  Result Date: 04/25/2018 EXAM: DUAL X-RAY ABSORPTIOMETRY (DXA) FOR BONE MINERAL DENSITY IMPRESSION: Referring Physician:  [place] Your patient completed a BMD test using Lunar IDXA DXA system ( analysis version: 16 ) manufactured by [employer]. PATIENT: Name: Deah, [place]taway Patient ID: [id number] Birth Date: 1955-12-24 Height: 65.5 in. Sex: Female Measured: 04/25/2018 Weight: 150.1 lbs. Indications: Caucasian, Depression, Estrogen Deficient, Family Hist. (Parent hip fracture), Family History of Osteoporosis, Gabapentin, Hysterectomy,  Low Calcium Intake (269.3), Postmenopausal, Prilosec, Tobacco User (Current Smoker), Wellbutrin Fractures: Elbow Treatments: Vitamin D (E933.5) ASSESSMENT: The BMD measured at Femur Total Left is 0.615 g/cm2 with a T-score of -3.1. This patient is considered OSTEOPOROTIC according to [facility]) criteria. The scan quality is good. L-3 and L-4 were excluded due to degenerative changes. Site Region Measured Date Measured Age YA BMD Significant CHANGE DualFemur Total Left 04/25/2018    61.8         -3.1    0.615 g/cm2 AP Spine  L1-L2      04/25/2018    61.8         -2.8    0.832 g/cm2 DualFemur Total Mean 04/25/2018    61.8         -3.0    0.628 g/cm2 World Health Organization [facility]) criteria for post-menopausal, Caucasian Women: Normal       T-score at or above -1 SD Osteopenia   T-score between -1 and -2.5 SD Osteoporosis T-score at or below -2.5 SD RECOMMENDATION: 1. All patients should optimize calcium and vitamin D intake. 2. Consider FDA approved medical therapies in postmenopausal women and men aged 67 years and older, based on the following: a. A hip or vertebral (clinical or morphometric) fracture b. T- score < or = -2.5 at the femoral neck or spine after appropriate evaluation to exclude secondary causes c. Low bone mass (T-score between -1.0 and -2.5 at the femoral neck or spine) and a 10 year probability of a hip fracture > or = 3% or a 10 year probability of a major osteoporosis-related fracture > or = 20% based on the [place]-adapted WHO algorithm d. Clinician judgment and/or patient preferences may indicate treatment for people with 10-year fracture probabilities above or below these levels FOLLOW-UP: People with diagnosed cases of osteoporosis or at high risk for fracture should have regular bone mineral density tests. For patients eligible for Medicare, routine testing is allowed once every 2 years. The testing frequency can be increased to one year for patients who have rapidly  progressing disease, those who are receiving or discontinuing medical therapy to restore bone mass, or have additional risk factors. I have reviewed this report and agree with the above findings. Mark A. Thornton Papas, M.D. [facility] Radiology Electronically Signed   By: Lavonia Dana M.D.   On: 04/25/2018 14:08   US Breast Ltd Uni Left Inc Axilla  Result Date: 05/01/2018 CLINICAL DATA:  Possible mass in the upper inner aspect of the left breast anteriorly on a recent screening mammogram. EXAM: DIGITAL DIAGNOSTIC LEFT MAMMOGRAM WITH TOMO ULTRASOUND LEFT BREAST COMPARISON:  Previous exam(s). ACR Breast Density Category b: There are scattered areas of fibroglandular density. FINDINGS: 3D tomographic and 2D generated spot  compression views of the left breast confirm a small, irregular mass with indistinct margins in the anterior aspect of the upper inner left breast. There is a small number of tiny associated microcalcifications. Slightly more laterally, there is an adjacent irregular area of mild asymmetry. Slightly more laterally in the upper left breast, there is a larger irregular area of increased density. On physical exam, no mass is palpable in the upper left breast or left axilla. Targeted ultrasound is performed, showing a 6 x 6 x 4 mm oval, mildly irregular medium echotexture mass with posterior acoustical shadowing in the 10:30 o'clock position of the left breast, 3 cm from the nipple. This corresponds to the small irregular mass seen mammographically on the initial screening mammogram and today. 5 mm more laterally, there is a 7 x 6 x 5 mm similar-appearing mass. This is in the 11 o'clock position, 3 cm from the nipple. This corresponds to the irregular area of mild asymmetry seen mammographically. More laterally, in the 12:30 o'clock position of the left breast, there is a poorly defined, irregular area of hypoechogenicity with posterior acoustical shadowing. This is not as well visualized without harmonic  imaging and appears significantly different than normal breast tissue more superiorly in the breast at ultrasound. This area measures 2.6 x 1.4 x 1.2 cm. This is centered in the 12:30 o'clock position, 3 cm from the nipple. Ultrasound of the left axilla demonstrates a single left axillary node with mild eccentric cortical thickening, measuring 3.6 mm in maximum thickness. Other left axillary lymph nodes have normal thin cortices at real-time. IMPRESSION: 1. 6 mm mass in the 10:30 o'clock position of the left breast with ultrasound in mammographic features highly suspicious for malignancy. 2. 7 mm mass in the 11 o'clock position of the left breast with imaging features suspicious for malignancy. 3. 2.6 cm ill-defined irregular hypoechoic area with posterior acoustical shadowing in the 12:30 o'clock position of the left breast. This is also suspicious for malignancy, possibly representing lobular neoplasia. 4. Single left axillary lymph node with mild eccentric cortical thickening, suspicious for a metastatic lymph node. RECOMMENDATION: 1. Ultrasound-guided core needle biopsy of the 6 mm mass in the 10:30 o'clock position of the left breast. 2. Ultrasound-guided core needle biopsy of the 2.6 cm ill-defined irregular hypoechoic area in the 12:30 o'clock position of the left breast. 3. Ultrasound-guided core needle biopsy of the mildly abnormal appearing left axillary lymph node. The findings and recommendation have been discussed with the patient and her daughter and the biopsies have been scheduled at 1:45 p.m. on 05/06/2018. I have discussed the findings and recommendations with the patient. Results were also provided in writing at the conclusion of the visit. If applicable, a reminder letter will be sent to the patient regarding the next appointment. BI-RADS CATEGORY  5: Highly suggestive of malignancy. Electronically Signed   By: Claudie Revering M.D.   On: 05/01/2018 17:01   Mm Diag Breast Tomo Uni Left  Result  Date: 05/01/2018 CLINICAL DATA:  Possible mass in the upper inner aspect of the left breast anteriorly on a recent screening mammogram. EXAM: DIGITAL DIAGNOSTIC LEFT MAMMOGRAM WITH TOMO ULTRASOUND LEFT BREAST COMPARISON:  Previous exam(s). ACR Breast Density Category b: There are scattered areas of fibroglandular density. FINDINGS: 3D tomographic and 2D generated spot compression views of the left breast confirm a small, irregular mass with indistinct margins in the anterior aspect of the upper inner left breast. There is a small number of tiny associated microcalcifications. Slightly more  laterally, there is an adjacent irregular area of mild asymmetry. Slightly more laterally in the upper left breast, there is a larger irregular area of increased density. On physical exam, no mass is palpable in the upper left breast or left axilla. Targeted ultrasound is performed, showing a 6 x 6 x 4 mm oval, mildly irregular medium echotexture mass with posterior acoustical shadowing in the 10:30 o'clock position of the left breast, 3 cm from the nipple. This corresponds to the small irregular mass seen mammographically on the initial screening mammogram and today. 5 mm more laterally, there is a 7 x 6 x 5 mm similar-appearing mass. This is in the 11 o'clock position, 3 cm from the nipple. This corresponds to the irregular area of mild asymmetry seen mammographically. More laterally, in the 12:30 o'clock position of the left breast, there is a poorly defined, irregular area of hypoechogenicity with posterior acoustical shadowing. This is not as well visualized without harmonic imaging and appears significantly different than normal breast tissue more superiorly in the breast at ultrasound. This area measures 2.6 x 1.4 x 1.2 cm. This is centered in the 12:30 o'clock position, 3 cm from the nipple. Ultrasound of the left axilla demonstrates a single left axillary node with mild eccentric cortical thickening, measuring 3.6 mm in  maximum thickness. Other left axillary lymph nodes have normal thin cortices at real-time. IMPRESSION: 1. 6 mm mass in the 10:30 o'clock position of the left breast with ultrasound in mammographic features highly suspicious for malignancy. 2. 7 mm mass in the 11 o'clock position of the left breast with imaging features suspicious for malignancy. 3. 2.6 cm ill-defined irregular hypoechoic area with posterior acoustical shadowing in the 12:30 o'clock position of the left breast. This is also suspicious for malignancy, possibly representing lobular neoplasia. 4. Single left axillary lymph node with mild eccentric cortical thickening, suspicious for a metastatic lymph node. RECOMMENDATION: 1. Ultrasound-guided core needle biopsy of the 6 mm mass in the 10:30 o'clock position of the left breast. 2. Ultrasound-guided core needle biopsy of the 2.6 cm ill-defined irregular hypoechoic area in the 12:30 o'clock position of the left breast. 3. Ultrasound-guided core needle biopsy of the mildly abnormal appearing left axillary lymph node. The findings and recommendation have been discussed with the patient and her daughter and the biopsies have been scheduled at 1:45 p.m. on 05/06/2018. I have discussed the findings and recommendations with the patient. Results were also provided in writing at the conclusion of the visit. If applicable, a reminder letter will be sent to the patient regarding the next appointment. BI-RADS CATEGORY  5: Highly suggestive of malignancy. Electronically Signed   By: Claudie Revering M.D.   On: 05/01/2018 17:01   Mm 3d Screen Breast Bilateral  Result Date: 04/28/2018 CLINICAL DATA:  Screening. EXAM: DIGITAL SCREENING BILATERAL MAMMOGRAM WITH TOMO AND CAD COMPARISON:  Previous exam(s). ACR Breast Density Category b: There are scattered areas of fibroglandular density. FINDINGS: In the left breast, a possible mass warrants further evaluation. In the right breast, no findings suspicious for malignancy.  Images were processed with CAD. IMPRESSION: Further evaluation is suggested for possible mass in the left breast. RECOMMENDATION: Diagnostic mammogram and possibly ultrasound of the left breast. (Code:[id number]-L-62M) The patient will be contacted regarding the findings, and additional imaging will be scheduled. BI-RADS CATEGORY  0: Incomplete. Need additional imaging evaluation and/or prior mammograms for comparison. Electronically Signed   By: Lajean Manes M.D.   On: 04/28/2018 11:36   [place] Axillary Node  Core Biopsy Left  Addendum Date: 05/07/2018   ADDENDUM REPORT: 05/07/2018 13:40 ADDENDUM: Pathology revealed GRADE II INVASIVE MAMMARY CARCINOMA of the Left breast, 10:30 o'clock, (ribbon shaped marker). GRADE II INVASIVE MAMMARY CARCINOMA, MAMMARY CARCINOMA IN-SITU of the Left breast, 12:30 o'clock, (coil shaped marker). METASTATIC CARCINOMA INVOLVING the Left axillary lymph node (hydroMARK clip). This was found to be concordant by Dr. Fidela Salisbury. Pathology results were discussed with the patient by telephone. The patient reported doing well after the biopsies with tenderness at the sites. Post biopsy instructions and care were reviewed and questions were answered. The patient was encouraged to call [facility] for any additional concerns. The patient was referred to [facility] on May 14, 2018. Recommendation for a bilateral breast MRI for further evaluation of extent of disease and a strong family history of breast cancer. Pathology results reported by Terie Purser, RN on 05/07/2018. Electronically Signed   By: Fidela Salisbury M.D.   On: 05/07/2018 13:40   Result Date: 05/07/2018 CLINICAL DATA:  Left breast 10:30 o'clock mass, left breast 12:30 o'clock area of hypoechogenicity, indeterminate left axillary lymph node. EXAM: ULTRASOUND GUIDED LEFT BREAST CORE NEEDLE BIOPSY COMPARISON:  Previous  exam(s). FINDINGS: I met with the patient and we discussed the procedure of ultrasound-guided biopsy, including benefits and alternatives. We discussed the high likelihood of a successful procedure. We discussed the risks of the procedure, including infection, bleeding, tissue injury, clip migration, and inadequate sampling. Informed written consent was given. The usual time-out protocol was performed immediately prior to the procedure. Using sterile technique and 1% Lidocaine as local anesthetic, under direct ultrasound visualization, a 14 gauge spring-loaded device was used to perform biopsy of left breast 10:30 o'clock mass using a lateral approach. At the conclusion of the procedure a ribbon shaped tissue marker clip was deployed into the biopsy cavity. Lesion quadrant: Upper inner quadrant. Using sterile technique and 1% Lidocaine as local anesthetic, under direct ultrasound visualization, a 14 gauge spring-loaded device was used to perform biopsy of left breast 12:30 o'clock area of hypoechogenicity using a lateral approach. At the conclusion of the procedure a coil shaped tissue marker clip was deployed into the biopsy cavity. Lesion quadrant: Upper outer quadrant. Using sterile technique and 1% Lidocaine as local anesthetic, under direct ultrasound visualization, a 14 gauge spring-loaded device was used to perform biopsy of left axillary lymph node using a lateral approach. At the conclusion of the procedure a HydroMARK tissue marker clip was deployed into the biopsy cavity. Lesion quadrant: Axilla. Follow up 2 view mammogram was performed and dictated separately. IMPRESSION: Ultrasound guided biopsy of left breast: 10:30 o'clock mass, ribbon shaped marker 12:30 o'clock area of hypoechogenicity, coil shaped marker Left axillary lymph node, HydroMARK. No apparent complications. Electronically Signed: By: Fidela Salisbury M.D. On: 05/06/2018 15:10   Mm Clip Placement Left  Result Date:  05/06/2018 CLINICAL DATA:  Post ultrasound-guided core needle biopsy of left breast 10:30 o'clock mass, left breast 12:30 o'clock area of hypoechogenicity, and left axillary lymph node. EXAM: DIAGNOSTIC LEFT MAMMOGRAM POST ULTRASOUND BIOPSY COMPARISON:  Previous exam(s). FINDINGS: Mammographic images were obtained following ultrasound guided biopsy of left breast10:30 o'clock mass, left breast 12:30 o'clock area of hypoechogenicity, and left axillary lymph node. Two-view mammography demonstrates presence of ribbon shaped marker at the first biopsy site, in the left breast 10:30 o'clock, coil shaped marker at the second biopsy site in the left breast 12:30  o'clock, and spiral HydroMARK within a left axillary lymph node. All markers are appropriately positioned. IMPRESSION: Successful placement of tissue markers, post ultrasound-guided core needle biopsy of the left breast: 10:30 o'clock mass, ribbon shaped marker 12:30 o'clock area of hypoechogenicity, coil shaped marker Left axillary lymph node, HydroMARK. Final Assessment: Post Procedure Mammograms for Marker Placement Electronically Signed   By: Fidela Salisbury M.D.   On: 05/06/2018 15:11   [place] Lt Breast Bx W Loc Dev 1st Lesion Img Bx Spec US Guide  Addendum Date: 05/07/2018   ADDENDUM REPORT: 05/07/2018 13:40 ADDENDUM: Pathology revealed GRADE II INVASIVE MAMMARY CARCINOMA of the Left breast, 10:30 o'clock, (ribbon shaped marker). GRADE II INVASIVE MAMMARY CARCINOMA, MAMMARY CARCINOMA IN-SITU of the Left breast, 12:30 o'clock, (coil shaped marker). METASTATIC CARCINOMA INVOLVING the Left axillary lymph node (hydroMARK clip). This was found to be concordant by Dr. Fidela Salisbury. Pathology results were discussed with the patient by telephone. The patient reported doing well after the biopsies with tenderness at the sites. Post biopsy instructions and care were reviewed and questions were answered. The patient was encouraged to call [facility] for any additional concerns. The patient was referred to [facility] on May 14, 2018. Recommendation for a bilateral breast MRI for further evaluation of extent of disease and a strong family history of breast cancer. Pathology results reported by Terie Purser, RN on 05/07/2018. Electronically Signed   By: Fidela Salisbury M.D.   On: 05/07/2018 13:40   Result Date: 05/07/2018 CLINICAL DATA:  Left breast 10:30 o'clock mass, left breast 12:30 o'clock area of hypoechogenicity, indeterminate left axillary lymph node. EXAM: ULTRASOUND GUIDED LEFT BREAST CORE NEEDLE BIOPSY COMPARISON:  Previous exam(s). FINDINGS: I met with the patient and we discussed the procedure of ultrasound-guided biopsy, including benefits and alternatives. We discussed the high likelihood of a successful procedure. We discussed the risks of the procedure, including infection, bleeding, tissue injury, clip migration, and inadequate sampling. Informed written consent was given. The usual time-out protocol was performed immediately prior to the procedure. Using sterile technique and 1% Lidocaine as local anesthetic, under direct ultrasound visualization, a 14 gauge spring-loaded device was used to perform biopsy of left breast 10:30 o'clock mass using a lateral approach. At the conclusion of the procedure a ribbon shaped tissue marker clip was deployed into the biopsy cavity. Lesion quadrant: Upper inner quadrant. Using sterile technique and 1% Lidocaine as local anesthetic, under direct ultrasound visualization, a 14 gauge spring-loaded device was used to perform biopsy of left breast 12:30 o'clock area of hypoechogenicity using a lateral approach. At the conclusion of the procedure a coil shaped tissue marker clip was deployed into the biopsy cavity. Lesion quadrant: Upper outer quadrant. Using sterile technique and 1% Lidocaine as local  anesthetic, under direct ultrasound visualization, a 14 gauge spring-loaded device was used to perform biopsy of left axillary lymph node using a lateral approach. At the conclusion of the procedure a HydroMARK tissue marker clip was deployed into the biopsy cavity. Lesion quadrant: Axilla. Follow up 2 view mammogram was performed and dictated separately. IMPRESSION: Ultrasound guided biopsy of left breast: 10:30 o'clock mass, ribbon shaped marker 12:30 o'clock area of hypoechogenicity, coil shaped marker Left axillary lymph node, HydroMARK. No apparent complications. Electronically Signed: By: Fidela Salisbury M.D. On: 05/06/2018 15:10   [place] Lt Breast Bx W Loc Dev Ea Add Lesion Img Bx Spec US Guide  Addendum Date: 05/07/2018  ADDENDUM REPORT: 05/07/2018 13:40 ADDENDUM: Pathology revealed GRADE II INVASIVE MAMMARY CARCINOMA of the Left breast, 10:30 o'clock, (ribbon shaped marker). GRADE II INVASIVE MAMMARY CARCINOMA, MAMMARY CARCINOMA IN-SITU of the Left breast, 12:30 o'clock, (coil shaped marker). METASTATIC CARCINOMA INVOLVING the Left axillary lymph node (hydroMARK clip). This was found to be concordant by Dr. Fidela Salisbury. Pathology results were discussed with the patient by telephone. The patient reported doing well after the biopsies with tenderness at the sites. Post biopsy instructions and care were reviewed and questions were answered. The patient was encouraged to call [facility] for any additional concerns. The patient was referred to [facility] on May 14, 2018. Recommendation for a bilateral breast MRI for further evaluation of extent of disease and a strong family history of breast cancer. Pathology results reported by Terie Purser, RN on 05/07/2018. Electronically Signed   By: Fidela Salisbury M.D.   On: 05/07/2018 13:40   Result Date: 05/07/2018 CLINICAL DATA:  Left breast  10:30 o'clock mass, left breast 12:30 o'clock area of hypoechogenicity, indeterminate left axillary lymph node. EXAM: ULTRASOUND GUIDED LEFT BREAST CORE NEEDLE BIOPSY COMPARISON:  Previous exam(s). FINDINGS: I met with the patient and we discussed the procedure of ultrasound-guided biopsy, including benefits and alternatives. We discussed the high likelihood of a successful procedure. We discussed the risks of the procedure, including infection, bleeding, tissue injury, clip migration, and inadequate sampling. Informed written consent was given. The usual time-out protocol was performed immediately prior to the procedure. Using sterile technique and 1% Lidocaine as local anesthetic, under direct ultrasound visualization, a 14 gauge spring-loaded device was used to perform biopsy of left breast 10:30 o'clock mass using a lateral approach. At the conclusion of the procedure a ribbon shaped tissue marker clip was deployed into the biopsy cavity. Lesion quadrant: Upper inner quadrant. Using sterile technique and 1% Lidocaine as local anesthetic, under direct ultrasound visualization, a 14 gauge spring-loaded device was used to perform biopsy of left breast 12:30 o'clock area of hypoechogenicity using a lateral approach. At the conclusion of the procedure a coil shaped tissue marker clip was deployed into the biopsy cavity. Lesion quadrant: Upper outer quadrant. Using sterile technique and 1% Lidocaine as local anesthetic, under direct ultrasound visualization, a 14 gauge spring-loaded device was used to perform biopsy of left axillary lymph node using a lateral approach. At the conclusion of the procedure a HydroMARK tissue marker clip was deployed into the biopsy cavity. Lesion quadrant: Axilla. Follow up 2 view mammogram was performed and dictated separately. IMPRESSION: Ultrasound guided biopsy of left breast: 10:30 o'clock mass, ribbon shaped marker 12:30 o'clock area of hypoechogenicity, coil shaped marker Left  axillary lymph node, HydroMARK. No apparent complications. Electronically Signed: By: Fidela Salisbury M.D. On: 05/06/2018 15:10      IMPRESSION/PLAN: Left breast cancer with extensive family history  Although genetic testing was negative, she is opting for bilateral mastectomies without reconstruction  It was a pleasure meeting the patient today. We discussed the risks, benefits, and side effects of radiotherapy. I recommend radiotherapy to the left chest wall and regional nodes to reduce her risk of locoregional recurrence by 2/3.  We discussed that radiation would take approximately 6 weeks to complete and that I would give the patient a few weeks to heal following surgery before starting treatment planning.  If chemotherapy were to be given, this would precede radiotherapy. We spoke about acute effects including skin  irritation and fatigue as well as much less common late effects including internal organ injury or irritation. We spoke about the latest technology that is used to minimize the risk of late effects for patients undergoing radiotherapy to the breast or chest wall. No guarantees of treatment were given. The patient is enthusiastic about proceeding with treatment. I look forward to participating in the patient's care.  I will await her referral back to me for postoperative follow-up and eventual CT simulation/treatment planning.   __________________________________________   Eppie Gibson, MD

## 2018-05-21 NOTE — Assessment & Plan Note (Signed)
05/06/2018:Screening detected left breast mass anteriorly 1030 to 11 o'clock position 2 masses 6 mm and 7 mm, 12:30 position 2.6 cm both of these masses biopsy-proven grade 2 invasive lobular cancer ER 100%, PR 10%, Ki-67 15%, HER-2 negative 1+ by IHC, lymph node positive for malignancy T2N1 stage IIa clinical stage  Pathology and radiology counseling: Discussed with the patient, the details of pathology including the type of breast cancer,the clinical staging, the significance of ER, PR and HER-2/neu receptors and the implications for treatment. After reviewing the pathology in detail, we proceeded to discuss the different treatment options between surgery, radiation, chemotherapy, antiestrogen therapies.  Recommendation: 1.  Breast conserving surgery with targeted lymph node dissection 2. breast MRI 3.  Mammaprint testing and the final pathology 4.  Adjuvant radiation therapy 5.  Adjuvant antiestrogen therapy with letrozole 2.5 mg daily x5 to 7 years  Mammaprint counseling: MINDACT is a prospective, randomized phase III controlled trial that investigates the clinical utility of MammaPrint, when compared to standard clinical pathological criteria, with 6,693 patients enrolled from over 111 institutions. Clinical high-risk patients with a Low Risk MammaPrint result, including 48% node-positive, had 5-year distant metastasis-free survival rate in excess of 94 percent, whether randomized to receive adjuvant chemotherapy or not proving MammaPrint's ability to safely identify Low Risk patients.  Patient had genetics done and brought a copy of the report.  We will send it to the genetic counselors to see if anything else is to be done. Return to clinic to decide if Mammaprint needs to be sent

## 2018-05-21 NOTE — Therapy (Signed)
[place], [place], [address] Phone: [phone]   Fax:  [phone]  Physical Therapy Evaluation  Patient Details  Name: Dawn Booker MRN: [id number] Date of Birth: 10-03-1955 Referring Provider: Dr. Autumn Messing   Encounter Date: 05/21/2018  PT End of Session - 05/21/18 1543    Visit Number  1    Number of Visits  2    Date for PT Re-Evaluation  07/16/18    PT Start Time  4097    PT Stop Time  3532   Also saw pt from [phone] for a total of 33 minutes   PT Time Calculation (min)  13 min    Activity Tolerance  Patient tolerated treatment well    Behavior During Therapy  [facility] for tasks assessed/performed       Past Medical History:  Diagnosis Date  . Acid reflux   . ADD (attention deficit disorder)   . Anxiety   . BIPOLAR DISORDER UNSPECIFIED 10/10/2009   Qualifier: Diagnosis of  By: Deatra Ina MD, Sandy Salaam   . Depression   . Esophageal reflux 10/10/2009   Qualifier: Diagnosis of  By: Deatra Ina MD, Sandy Salaam   . Hyperlipemia, mixed   . Insomnia   . MVP (mitral valve prolapse)   . Shingles     Past Surgical History:  Procedure Laterality Date  . BREAST EXCISIONAL BIOPSY Left 1995   benign  . ELBOW SURGERY Left   . PARTIAL HYSTERECTOMY    . THYROIDECTOMY, PARTIAL    . TUBAL LIGATION      There were no vitals filed for this visit.   Subjective Assessment - 05/21/18 1538    Subjective  Patient reports she is here today to be seen by her medical team for her newly diagnosed left breast cancer.    Patient is accompained by:  Family member    Pertinent History  Patient was diagnosed on 04/25/18 with left grade II invasive lobular carcinoma breast cancer. There are 2 areas: 2.6 cm in the upper outer quadrant and 6 mm in the upper inner quadrant. Both are ER/PR positive and HER2 negative with a Ki67 of 10% and she has a positive axillary node.    Patient Stated Goals  Reduce lymphedema risk and learn  post op shoulder ROM HEP    Currently in Pain?  Yes    Pain Score  --   Varies   Pain Location  --   Bilateral upper traps   Pain Orientation  Right;Left    Pain Descriptors / Indicators  Aching    Pain Type  Chronic pain    Pain Onset  More than a month ago    Pain Frequency  Intermittent    Aggravating Factors   stress    Pain Relieving Factors  Using good posture    Multiple Pain Sites  No         [facility] PT Assessment - 05/21/18 0001      Assessment   Medical Diagnosis  Left breast cancer    Referring Provider  Dr. Autumn Messing    Onset Date/Surgical Date  04/25/18    Hand Dominance  Right    Prior Therapy  none      Precautions   Precautions  Other (comment)    Precaution Comments  active cancer      Restrictions   Weight Bearing Restrictions  No      Balance Screen   Has the patient fallen  in the past 6 months  No    Has the patient had a decrease in activity level because of a fear of falling?   No    Is the patient reluctant to leave their home because of a fear of falling?   No      Home Environment   Living Environment  Private residence    Living Arrangements  Spouse/significant other    Available Help at Discharge  Family      Prior Function   Level of Independence  Independent    Vocation  Full time employment    Vocation Requirements  [employer] calls at [employer] [facility] [employer]    Leisure  She does not exercise      Cognition   Overall Cognitive Status  Within Functional Limits for tasks assessed      Posture/Postural Control   Posture/Postural Control  Postural limitations    Postural Limitations  Rounded Shoulders;Forward head      ROM / Strength   AROM / PROM / Strength  AROM;Strength      AROM   AROM Assessment Site  Shoulder;Cervical    Right/Left Shoulder  Right;Left    Right Shoulder Extension  45 Degrees    Right Shoulder Flexion  158 Degrees    Right Shoulder ABduction  166 Degrees    Right Shoulder Internal Rotation  62 Degrees    Right  Shoulder External Rotation  78 Degrees    Left Shoulder Extension  43 Degrees    Left Shoulder Flexion  155 Degrees    Left Shoulder ABduction  165 Degrees    Left Shoulder Internal Rotation  67 Degrees    Left Shoulder External Rotation  80 Degrees    Cervical Flexion  WNL    Cervical Extension  WNL    Cervical - Right Side Bend  WNL    Cervical - Left Side Bend  WNL    Cervical - Right Rotation  WNL    Cervical - Left Rotation  WNL      Strength   Overall Strength  Within functional limits for tasks performed        LYMPHEDEMA/ONCOLOGY QUESTIONNAIRE - 05/21/18 1542      Type   Cancer Type  Left breast cancer      Lymphedema Assessments   Lymphedema Assessments  Upper extremities      Right Upper Extremity Lymphedema   10 cm Proximal to Olecranon Process  26.5 cm    Olecranon Process  22.8 cm    10 cm Proximal to Ulnar Styloid Process  21.3 cm    Just Proximal to Ulnar Styloid Process  14.1 cm    Across Hand at [employer]  17.7 cm    At [place] of 2nd Digit  6 cm      Left Upper Extremity Lymphedema   10 cm Proximal to Olecranon Process  25.5 cm    Olecranon Process  22.2 cm    10 cm Proximal to Ulnar Styloid Process  20.2 cm    Just Proximal to Ulnar Styloid Process  14.1 cm    Across Hand at [employer]  16.8 cm    At [place] of 2nd Digit  5.5 cm             Objective measurements completed on examination: See above findings.     Patient was instructed today in a home exercise program today for post op shoulder range of motion. These included active assist  shoulder flexion in sitting, scapular retraction, wall walking with shoulder abduction, and hands behind head external rotation.  She was encouraged to do these twice a day, holding 3 seconds and repeating 5 times when permitted by her physician.       PT Education - 05/21/18 1543    Education Details  Lymphedema risk reduction and post op shoulder ROM HEP    Person(s) Educated   Patient;Spouse;Child(ren)    Methods  Explanation;Demonstration;Handout    Comprehension  Returned demonstration;Verbalized understanding          PT Long Term Goals - 05/21/18 1548      PT LONG TERM GOAL #1   Title  Patient will demonstrate she has regained shoulder ROM and function post operatively compared to baseline measurements.    Time  [facility] Goals - 05/21/18 1548      Patient will be able to verbalize understanding of pertinent lymphedema risk reduction practices relevant to her diagnosis specifically related to skin care.   Time  1    Period  Days    Status  Achieved      Patient will be able to return demonstrate and/or verbalize understanding of the post-op home exercise program related to regaining shoulder range of motion.   Time  1    Period  Days    Status  Achieved      Patient will be able to verbalize understanding of the importance of attending the postoperative After Breast Cancer Class for further lymphedema risk reduction education and therapeutic exercise.   Time  1    Period  Days    Status  Achieved            Plan - 05/21/18 1544    Clinical Impression Statement  Patient was diagnosed on 04/25/18 with left grade II invasive lobular carcinoma breast cancer. There are 2 areas: 2.6 cm in the upper outer quadrant and 6 mm in the upper inner quadrant. Both are ER/PR positive and HER2 negative with a Ki67 of 10% and she has a positive axillary node. Her multidisciplinary medical team met prior to her assessments to determine a recommended treatment plan. She is planning to have a bilateral mastectomy and left targeted axillary node dissection followed by Mammaprint testing, radiation, and anti-estrogen therapy. She will benefit from post op PT to regain shoulder ROM and reduce lymphedema risk.    Clinical Presentation  Stable    Clinical Decision Making  Low    Rehab Potential  Excellent    Clinical  Impairments Affecting Rehab Potential  None    PT Frequency  --   Eval and 1 f/u visit   PT Treatment/Interventions  ADLs/Self Care Home Management;Therapeutic exercise;Patient/family education    PT Next Visit Plan  Will f/u 3-4 weeks after surgery to determine PT needs    PT Home Exercise Plan  Post op shoulder ROM HEP    Consulted and Agree with Plan of Care  Patient;Family member/caregiver    Family Member Consulted  Husband, daughter       Patient will benefit from skilled therapeutic intervention in order to improve the following deficits and impairments:  Decreased knowledge of precautions, Pain, Impaired UE functional use, Decreased range of motion, Postural dysfunction  Visit Diagnosis: Malignant neoplasm of overlapping sites of left breast in female, estrogen receptor positive ([place]) - Plan: PT plan of care cert/re-cert  Abnormal posture - Plan: PT plan of care cert/re-cert   Patient will follow up at outpatient cancer rehab 3-4 weeks following surgery.  If the patient requires physical therapy at that time, a specific plan will be dictated and sent to the referring physician for approval. The patient was educated today on appropriate basic range of motion exercises to begin post operatively and the importance of attending the After Breast Cancer class following surgery.  Patient was educated today on lymphedema risk reduction practices as it pertains to recommendations that will benefit the patient immediately following surgery.  She verbalized good understanding.     Problem List Patient Active Problem List   Diagnosis Date Noted  . Malignant neoplasm of overlapping sites of left breast in female, estrogen receptor positive ([place]) 05/09/2018  . Blurring of visual image of both eyes 12/06/2017  . Postherpetic neuralgia 12/06/2017  . BIPOLAR DISORDER UNSPECIFIED 10/10/2009  . ESOPHAGEAL REFLUX 10/10/2009   Annia Friendly, PT 05/21/18 3:51 PM   [place], [place], [address] Phone: [phone]   Fax:  [phone]  Name: Dawn Booker MRN: [id number] Date of Birth: 1955/12/15

## 2018-05-21 NOTE — Patient Instructions (Signed)

## 2018-05-23 ENCOUNTER — Encounter: Payer: Self-pay | Admitting: General Practice

## 2018-05-23 NOTE — Progress Notes (Signed)
[place] Psychosocial Distress Screening Spiritual Care  Reached [place] by phone following [facility] to introduce [place] team/resources, reviewing distress screen per protocol.  The patient scored a 4 on the Psychosocial Distress Thermometer which indicates moderate distress. Also assessed for distress and other psychosocial needs.   ONCBCN DISTRESS SCREENING 05/23/2018  Screening Type Initial Screening  Distress experienced in past week (1-10) 4  Practical problem type Work/school  Family Problem type Children  Emotional problem type Nervousness/Anxiety;Adjusting to illness  Spiritual/Religous concerns type Facing my mortality  Physical Problem type Tingling hands/feet  Referral to support programs Yes   Dawn Booker is very clear about naming her stressors: a family relationship, work dynamics, fatigue, and her mother's death in April 22, 2017. Per pt, her biggest stressor related specifically to cancer is discerning about single vs bilateral mastectomy, though certainly vulnerability/mortality is stirred up too. She welcomes Pharmacist, hospital referral so that she might speak with someone who has experienced mastectomy(-ies). Provided pastoral listening, emotional support, and normalization of feelings. Also suggested counseling intern Doris Cheadle as a resource for free counseling related to these concerns.  Follow up needed: No. [facility] referral per pt request. [place] knows to contact Support Team as needed/desired, but please also page if immediate needs arise or circumstances change. Thank you.   [place], [place], [facility] Pager [phone] Voicemail [phone]

## 2018-05-26 ENCOUNTER — Telehealth: Payer: Self-pay | Admitting: Hematology and Oncology

## 2018-05-26 ENCOUNTER — Telehealth: Payer: Self-pay | Admitting: *Deleted

## 2018-05-26 ENCOUNTER — Other Ambulatory Visit: Payer: Self-pay | Admitting: General Surgery

## 2018-05-26 DIAGNOSIS — C50812 Malignant neoplasm of overlapping sites of left female breast: Secondary | ICD-10-CM

## 2018-05-26 DIAGNOSIS — Z170 Estrogen receptor positive status [ER+]: Secondary | ICD-10-CM

## 2018-05-26 NOTE — Progress Notes (Signed)
FMLA paperwork successfully faxed to [employer] attn: Evonnie Dawes at [phone]. Accident and [employer] form successfully faxed to [employer] at [phone]. Mailed copies of both forms to patient address on file.

## 2018-05-26 NOTE — Telephone Encounter (Signed)
Left vm regarding [facility] from 9.18.19. Contact information provided for questions or needs.

## 2018-05-26 NOTE — Telephone Encounter (Signed)
Scheduled appt per 9/23 sch message - sent reminder letter in the mail with appt date and time.

## 2018-05-30 MED FILL — TEMAZEPAM 30 MG CAPSULE: 30 | 30 days supply | Qty: 30 | Fill #0

## 2018-06-02 ENCOUNTER — Encounter: Payer: Self-pay | Admitting: *Deleted

## 2018-06-05 NOTE — Pre-Procedure Instructions (Signed)
Dawn Booker  06/05/2018      [place], [place] [address] Phone: [phone] Fax: [phone]    Your procedure is scheduled on 06-13-2018  Friday .  Report to [facility] Admitting at 5:30 A.M.   Call this number if you have problems the morning of surgery:  [phone]   Remember:  Do not eat food or drink liquids after midnight.                         Take these medicines the morning of surgery with A SIP OF WATER   Bupropion)Wellbutrin XL) Omeprazole(Prilosec) Rosuvastatin(Crestor)   STOP TAKING ANY ASPIRIN (UNLESS OTHERWISE INSTRUCTED BY YOUR SURGEON),ANTIINFLAMATORIES (IBUPROFEN,ALEVE,MOTRIN,ADVIL,GOODY'S POWDERS),HERBAL SUPPLEMENTS,FISH OIL,AND VITAMINS 5-7 DAYS PRIOR TO SURGERY      Do not wear jewelry, make-up or nail polish.  Do not wear lotions, powders, or perfumes, or deodorant.  Do not shave 48 hours prior to surgery.  Men may shave face and neck.  Do not bring valuables to the hospital.  [facility] is not responsible for any belongings or valuables.  Contacts, dentures or bridgework may not be worn into surgery.  Leave your suitcase in the car.  After surgery it may be brought to your room.  For patients admitted to the hospital, discharge time will be determined by your treatment team.  Patients discharged the day of surgery will not be allowed to drive home.     [place] - Preparing for Surgery  Before surgery, you can play an important role.  Because skin is not sterile, your skin needs to be as free of germs as possible.  You can reduce the number of germs on you skin by washing with CHG (chlorahexidine gluconate) soap before surgery.  CHG is an antiseptic cleaner which kills germs and bonds with the skin to continue killing germs even after washing.  Oral Hygiene is also important in reducing the risk of infection.  Remember to brush your  teeth with your regular toothpaste the morning of surgery.  Please DO NOT use if you have an allergy to CHG or antibacterial soaps.  If your skin becomes reddened/irritated stop using the CHG and inform your nurse when you arrive at Short Stay.  Do not shave (including legs and underarms) for at least 48 hours prior to the first CHG shower.  You may shave your face.  Please follow these instructions carefully:   1.  Shower with CHG Soap the night before surgery and the morning of Surgery.  2.  If you choose to wash your hair, wash your hair first as usual with your normal shampoo.  3.  After you shampoo, rinse your hair and body thoroughly to remove the shampoo. 4.  Use CHG as you would any other liquid soap.  You can apply chg directly to the skin and wash gently with a      scrungie or washcloth.           5.  Apply the CHG Soap to your body ONLY FROM THE NECK DOWN.   Do not use on open wounds or open sores. Avoid contact with your eyes, ears, mouth and genitals (private parts).  Wash genitals (private parts) with your normal soap.  6.  Wash thoroughly, paying special attention to the area where your surgery will be performed.  7.  Thoroughly rinse your  body with warm water from the neck down.  8.  DO NOT shower/wash with your normal soap after using and rinsing off the CHG Soap.  9.  Pat yourself dry with a clean towel.            10.  Wear clean pajamas.            11.  Place clean sheets on your bed the night of your first shower and do not sleep with pets.  Day of Surgery  Do not apply any lotions/deoderants the morning of surgery.   Please wear clean clothes to the hospital/surgery center. Remember to brush your teeth with toothpaste.    Please read over the following fact sheets that you were given. Pain Booklet, Coughing and Deep Breathing and Surgical Site Infection Prevention

## 2018-06-06 ENCOUNTER — Encounter (HOSPITAL_COMMUNITY)
Admission: RE | Admit: 2018-06-06 | Discharge: 2018-06-06 | Disposition: A | Discharge status: Home / Self Care | Payer: 59 | Source: Ambulatory Visit | Attending: General Surgery | Admitting: General Surgery

## 2018-06-06 ENCOUNTER — Encounter (HOSPITAL_COMMUNITY): Payer: Self-pay

## 2018-06-06 ENCOUNTER — Other Ambulatory Visit: Payer: Self-pay

## 2018-06-06 DIAGNOSIS — Z01818 Encounter for other preprocedural examination: Secondary | ICD-10-CM | POA: Insufficient documentation

## 2018-06-06 DIAGNOSIS — R9431 Abnormal electrocardiogram [ECG] [EKG]: Secondary | ICD-10-CM | POA: Insufficient documentation

## 2018-06-06 LAB — BASIC METABOLIC PANEL
Anion gap: 6 (ref 5–15)
BUN: 10 mg/dL (ref 8–23)
CO2: 26 mmol/L (ref 22–32)
Calcium: 9.2 mg/dL (ref 8.9–10.3)
Chloride: 107 mmol/L (ref 98–111)
Creatinine, Ser: 0.76 mg/dL (ref 0.44–1.00)
GFR calc Af Amer: >60 mL/min (ref >60)
GFR calc non Af Amer: >60 mL/min (ref >60)
Glucose, Bld: 79 mg/dL (ref 70–99)
Potassium: 3.6 mmol/L (ref 3.5–5.1)
Sodium: 139 mmol/L (ref 135–145)

## 2018-06-06 LAB — CBC
HCT: 42.4 % (ref 36.0–46.0)
Hemoglobin: 13.7 g/dL (ref 12.0–15.0)
MCH: 31.3 pg (ref 26.0–34.0)
MCHC: 32.3 g/dL (ref 30.0–36.0)
MCV: 96.8 fL (ref 78.0–100.0)
Platelets: 195 10*3/uL (ref 150–400)
RBC: 4.38 MIL/uL (ref 3.87–5.11)
RDW: 12 % (ref 11.5–15.5)
WBC: 5.4 10*3/uL (ref 4.0–10.5)

## 2018-06-06 MED FILL — OMEPRAZOLE 40 MG CPDR: 40 | 90 days supply | Qty: 90 | Fill #3

## 2018-06-06 NOTE — Progress Notes (Signed)
PCP  Lawerance Cruel MD  Cardiologist   Dorris Carnes   MD Saw due to MVP  Stress test 06-11-2017  Echo 06-21-2018  Pt. Was told everything was fine and she did not need to follow up with her again.

## 2018-06-12 ENCOUNTER — Ambulatory Visit
Admission: RE | Admit: 2018-06-12 | Discharge: 2018-06-12 | Disposition: A | Discharge status: Home / Self Care | Payer: 59 | Source: Ambulatory Visit | Attending: General Surgery | Admitting: General Surgery

## 2018-06-12 ENCOUNTER — Other Ambulatory Visit: Payer: Self-pay | Admitting: General Surgery

## 2018-06-12 DIAGNOSIS — Z170 Estrogen receptor positive status [ER+]: Secondary | ICD-10-CM

## 2018-06-12 DIAGNOSIS — C50812 Malignant neoplasm of overlapping sites of left female breast: Secondary | ICD-10-CM

## 2018-06-12 DIAGNOSIS — R590 Localized enlarged lymph nodes: Secondary | ICD-10-CM | POA: Diagnosis not present

## 2018-06-12 DIAGNOSIS — C50912 Malignant neoplasm of unspecified site of left female breast: Secondary | ICD-10-CM | POA: Diagnosis not present

## 2018-06-13 ENCOUNTER — Ambulatory Visit (HOSPITAL_COMMUNITY)
Admission: RE | Admit: 2018-06-13 | Discharge: 2018-06-13 | Disposition: A | Discharge status: Home / Self Care | Payer: 59 | Source: Ambulatory Visit | Attending: General Surgery | Admitting: General Surgery

## 2018-06-13 ENCOUNTER — Encounter (HOSPITAL_COMMUNITY)
Admission: RE | Disposition: A | Discharge status: Home / Self Care | Payer: Self-pay | Source: Ambulatory Visit | Attending: General Surgery

## 2018-06-13 ENCOUNTER — Other Ambulatory Visit: Payer: Self-pay

## 2018-06-13 ENCOUNTER — Encounter (HOSPITAL_COMMUNITY): Payer: Self-pay | Admitting: Anesthesiology

## 2018-06-13 ENCOUNTER — Ambulatory Visit (HOSPITAL_COMMUNITY)
Admission: RE | Admit: 2018-06-13 | Discharge: 2018-06-14 | Disposition: A | Discharge status: Home / Self Care | Payer: 59 | Source: Ambulatory Visit | Attending: General Surgery | Admitting: General Surgery

## 2018-06-13 ENCOUNTER — Ambulatory Visit
Admission: RE | Admit: 2018-06-13 | Discharge: 2018-06-13 | Disposition: A | Discharge status: Home / Self Care | Payer: 59 | Source: Ambulatory Visit | Attending: General Surgery | Admitting: General Surgery

## 2018-06-13 ENCOUNTER — Ambulatory Visit (HOSPITAL_COMMUNITY): Payer: 59 | Admitting: Anesthesiology

## 2018-06-13 DIAGNOSIS — F419 Anxiety disorder, unspecified: Secondary | ICD-10-CM | POA: Insufficient documentation

## 2018-06-13 DIAGNOSIS — Z170 Estrogen receptor positive status [ER+]: Secondary | ICD-10-CM | POA: Diagnosis not present

## 2018-06-13 DIAGNOSIS — F329 Major depressive disorder, single episode, unspecified: Secondary | ICD-10-CM | POA: Insufficient documentation

## 2018-06-13 DIAGNOSIS — N6011 Diffuse cystic mastopathy of right breast: Secondary | ICD-10-CM | POA: Diagnosis not present

## 2018-06-13 DIAGNOSIS — Z882 Allergy status to sulfonamides status: Secondary | ICD-10-CM | POA: Insufficient documentation

## 2018-06-13 DIAGNOSIS — G8918 Other acute postprocedural pain: Secondary | ICD-10-CM | POA: Diagnosis not present

## 2018-06-13 DIAGNOSIS — C50112 Malignant neoplasm of central portion of left female breast: Secondary | ICD-10-CM | POA: Insufficient documentation

## 2018-06-13 DIAGNOSIS — C773 Secondary and unspecified malignant neoplasm of axilla and upper limb lymph nodes: Secondary | ICD-10-CM | POA: Insufficient documentation

## 2018-06-13 DIAGNOSIS — C50912 Malignant neoplasm of unspecified site of left female breast: Secondary | ICD-10-CM | POA: Diagnosis present

## 2018-06-13 DIAGNOSIS — C50812 Malignant neoplasm of overlapping sites of left female breast: Secondary | ICD-10-CM | POA: Diagnosis not present

## 2018-06-13 DIAGNOSIS — Z9013 Acquired absence of bilateral breasts and nipples: Secondary | ICD-10-CM | POA: Insufficient documentation

## 2018-06-13 DIAGNOSIS — K219 Gastro-esophageal reflux disease without esophagitis: Secondary | ICD-10-CM | POA: Insufficient documentation

## 2018-06-13 DIAGNOSIS — E7800 Pure hypercholesterolemia, unspecified: Secondary | ICD-10-CM | POA: Insufficient documentation

## 2018-06-13 DIAGNOSIS — Z87891 Personal history of nicotine dependence: Secondary | ICD-10-CM | POA: Diagnosis not present

## 2018-06-13 DIAGNOSIS — Z79899 Other long term (current) drug therapy: Secondary | ICD-10-CM | POA: Diagnosis not present

## 2018-06-13 DIAGNOSIS — C779 Secondary and unspecified malignant neoplasm of lymph node, unspecified: Secondary | ICD-10-CM | POA: Diagnosis not present

## 2018-06-13 DIAGNOSIS — Z853 Personal history of malignant neoplasm of breast: Secondary | ICD-10-CM | POA: Diagnosis not present

## 2018-06-13 DIAGNOSIS — R590 Localized enlarged lymph nodes: Secondary | ICD-10-CM | POA: Diagnosis not present

## 2018-06-13 HISTORY — DX: Malignant (primary) neoplasm, unspecified: C80.1

## 2018-06-13 HISTORY — PX: MASTECTOMY W/ SENTINEL NODE BIOPSY: SHX2001

## 2018-06-13 HISTORY — PX: MASTECTOMY WITH RADIOACTIVE SEED GUIDED EXCISION AND AXILLARY SENTINEL LYMPH NODE BIOPSY: SHX6736

## 2018-06-13 SURGERY — MASTECTOMY WITH RADIOACTIVE SEED GUIDED EXCISION AND AXILLARY SENTINEL LYMPH NODE BIOPSY
Anesthesia: General | Site: Breast | Laterality: Bilateral

## 2018-06-13 MED ORDER — HEPARIN SODIUM (PORCINE) 5000 UNIT/ML IJ SOLN
5000.0000 [IU] | Freq: Three times a day (TID) | INTRAMUSCULAR | Status: DC
  Administered 2018-06-14: 5000 [IU] via SUBCUTANEOUS
  Filled 2018-06-13: qty 1

## 2018-06-13 MED ORDER — BUPROPION HCL ER (XL) 150 MG PO TB24: 150.0000 mg | ORAL_TABLET | Freq: Every day | ORAL | Status: DC

## 2018-06-13 MED ORDER — DEXAMETHASONE SODIUM PHOSPHATE 10 MG/ML IJ SOLN
INTRAMUSCULAR | Status: AC
  Filled 2018-06-13: qty 1

## 2018-06-13 MED ORDER — HYDROCODONE-ACETAMINOPHEN 5-325 MG PO TABS: 1.0000 | ORAL_TABLET | Freq: Four times a day (QID) | ORAL | 0 refills | Status: DC | PRN

## 2018-06-13 MED ORDER — HYDROCODONE-ACETAMINOPHEN 5-325 MG PO TABS
1.0000 | ORAL_TABLET | ORAL | Status: DC | PRN
  Administered 2018-06-13: 2 via ORAL
  Filled 2018-06-13: qty 1

## 2018-06-13 MED ORDER — ROCURONIUM BROMIDE 50 MG/5ML IV SOSY
PREFILLED_SYRINGE | INTRAVENOUS | Status: DC | PRN
  Administered 2018-06-13: 50 mg via INTRAVENOUS

## 2018-06-13 MED ORDER — LIDOCAINE 2% (20 MG/ML) 5 ML SYRINGE
INTRAMUSCULAR | Status: AC
  Filled 2018-06-13: qty 5

## 2018-06-13 MED ORDER — HYDROMORPHONE HCL 1 MG/ML IJ SOLN
INTRAMUSCULAR | Status: AC
  Filled 2018-06-13: qty 1

## 2018-06-13 MED ORDER — FENTANYL CITRATE (PF) 100 MCG/2ML IJ SOLN
INTRAMUSCULAR | Status: AC
  Filled 2018-06-13: qty 2

## 2018-06-13 MED ORDER — MIDAZOLAM HCL 2 MG/2ML IJ SOLN
INTRAMUSCULAR | Status: AC
  Filled 2018-06-13: qty 2

## 2018-06-13 MED ORDER — METHOCARBAMOL 500 MG PO TABS
500.0000 mg | ORAL_TABLET | Freq: Four times a day (QID) | ORAL | Status: DC | PRN
  Administered 2018-06-13: 500 mg via ORAL

## 2018-06-13 MED ORDER — HYDROMORPHONE HCL 1 MG/ML IJ SOLN
0.2500 mg | INTRAMUSCULAR | Status: DC | PRN
  Administered 2018-06-13 (×4): 0.5 mg via INTRAVENOUS

## 2018-06-13 MED ORDER — METHYLENE BLUE 0.5 % INJ SOLN
INTRAVENOUS | Status: AC
  Filled 2018-06-13: qty 10

## 2018-06-13 MED ORDER — MIDAZOLAM HCL 5 MG/5ML IJ SOLN
INTRAMUSCULAR | Status: DC | PRN
  Administered 2018-06-13: 2 mg via INTRAVENOUS

## 2018-06-13 MED ORDER — CEFAZOLIN SODIUM-DEXTROSE 2-4 GM/100ML-% IV SOLN
2.0000 g | INTRAVENOUS | Status: AC
  Administered 2018-06-13: 2 g via INTRAVENOUS
  Filled 2018-06-13: qty 100

## 2018-06-13 MED ORDER — ACETAMINOPHEN 500 MG PO TABS
1000.0000 mg | ORAL_TABLET | ORAL | Status: AC
  Administered 2018-06-13: 1000 mg via ORAL
  Filled 2018-06-13: qty 2

## 2018-06-13 MED ORDER — PROPOFOL 10 MG/ML IV BOLUS
INTRAVENOUS | Status: DC | PRN
  Administered 2018-06-13: 200 mg via INTRAVENOUS

## 2018-06-13 MED ORDER — SODIUM CHLORIDE 0.9 % IV SOLN
INTRAVENOUS | Status: DC | PRN
  Administered 2018-06-13: 50 ug/min via INTRAVENOUS

## 2018-06-13 MED ORDER — ROCURONIUM BROMIDE 50 MG/5ML IV SOSY
PREFILLED_SYRINGE | INTRAVENOUS | Status: AC
  Filled 2018-06-13: qty 5

## 2018-06-13 MED ORDER — ONDANSETRON 4 MG PO TBDP: 4.0000 mg | ORAL_TABLET | Freq: Four times a day (QID) | ORAL | Status: DC | PRN

## 2018-06-13 MED ORDER — 0.9 % SODIUM CHLORIDE (POUR BTL) OPTIME
TOPICAL | Status: DC | PRN
  Administered 2018-06-13 (×2): 1000 mL

## 2018-06-13 MED ORDER — FENTANYL CITRATE (PF) 250 MCG/5ML IJ SOLN
INTRAMUSCULAR | Status: AC
  Filled 2018-06-13: qty 5

## 2018-06-13 MED ORDER — KETOROLAC TROMETHAMINE 30 MG/ML IJ SOLN
30.0000 mg | Freq: Once | INTRAMUSCULAR | Status: AC | PRN
  Administered 2018-06-13: 30 mg via INTRAVENOUS

## 2018-06-13 MED ORDER — HYDROXYZINE HCL 25 MG PO TABS: 25.0000 mg | ORAL_TABLET | Freq: Four times a day (QID) | ORAL | Status: DC | PRN

## 2018-06-13 MED ORDER — KETAMINE HCL 10 MG/ML IJ SOLN
INTRAMUSCULAR | Status: DC | PRN
  Administered 2018-06-13 (×2): 10 mg via INTRAVENOUS
  Administered 2018-06-13: 30 mg via INTRAVENOUS

## 2018-06-13 MED ORDER — HYDROXYZINE PAMOATE 25 MG PO CAPS: 25.0000 mg | ORAL_CAPSULE | ORAL | Status: DC | PRN

## 2018-06-13 MED ORDER — GABAPENTIN 300 MG PO CAPS
300.0000 mg | ORAL_CAPSULE | ORAL | Status: AC
  Administered 2018-06-13: 300 mg via ORAL
  Filled 2018-06-13: qty 1

## 2018-06-13 MED ORDER — EPHEDRINE SULFATE-NACL 50-0.9 MG/10ML-% IV SOSY
PREFILLED_SYRINGE | INTRAVENOUS | Status: DC | PRN
  Administered 2018-06-13 (×3): 5 mg via INTRAVENOUS

## 2018-06-13 MED ORDER — ONDANSETRON HCL 4 MG/2ML IJ SOLN
INTRAMUSCULAR | Status: DC | PRN
  Administered 2018-06-13: 4 mg via INTRAVENOUS

## 2018-06-13 MED ORDER — LIDOCAINE 2% (20 MG/ML) 5 ML SYRINGE
INTRAMUSCULAR | Status: DC | PRN
  Administered 2018-06-13: 40 mg via INTRAVENOUS
  Administered 2018-06-13: 60 mg via INTRAVENOUS
  Administered 2018-06-13 (×2): 40 mg via INTRAVENOUS

## 2018-06-13 MED ORDER — ONDANSETRON HCL 4 MG/2ML IJ SOLN
INTRAMUSCULAR | Status: AC
  Filled 2018-06-13: qty 2

## 2018-06-13 MED ORDER — KETOROLAC TROMETHAMINE 30 MG/ML IJ SOLN
INTRAMUSCULAR | Status: AC
  Filled 2018-06-13: qty 1

## 2018-06-13 MED ORDER — BUPIVACAINE HCL (PF) 0.5 % IJ SOLN
INTRAMUSCULAR | Status: AC
  Filled 2018-06-13: qty 30

## 2018-06-13 MED ORDER — METHOCARBAMOL 500 MG PO TABS
ORAL_TABLET | ORAL | Status: AC
  Filled 2018-06-13: qty 1

## 2018-06-13 MED ORDER — PANTOPRAZOLE SODIUM 40 MG PO TBEC: 40.0000 mg | DELAYED_RELEASE_TABLET | Freq: Every day | ORAL | Status: DC

## 2018-06-13 MED ORDER — TRAMADOL HCL 50 MG PO TABS
100.0000 mg | ORAL_TABLET | Freq: Four times a day (QID) | ORAL | Status: DC | PRN
  Administered 2018-06-13 – 2018-06-14 (×3): 100 mg via ORAL
  Filled 2018-06-13 (×3): qty 2

## 2018-06-13 MED ORDER — TECHNETIUM TC 99M SULFUR COLLOID FILTERED
1.0000 | Freq: Once | INTRAVENOUS | Status: AC | PRN
  Administered 2018-06-13: 1 via INTRADERMAL

## 2018-06-13 MED ORDER — SODIUM BICARBONATE 4 % IV SOLN
INTRAVENOUS | Status: AC
  Filled 2018-06-13: qty 5

## 2018-06-13 MED ORDER — ROPIVACAINE HCL 5 MG/ML IJ SOLN
INTRAMUSCULAR | Status: DC | PRN
  Administered 2018-06-13 (×12): 5 mL via PERINEURAL

## 2018-06-13 MED ORDER — TRAMADOL HCL 50 MG PO TABS: 50.0000 mg | ORAL_TABLET | Freq: Four times a day (QID) | ORAL | 1 refills | Status: DC | PRN

## 2018-06-13 MED ORDER — ROSUVASTATIN CALCIUM 10 MG PO TABS
10.0000 mg | ORAL_TABLET | Freq: Every day | ORAL | Status: DC
  Filled 2018-06-13: qty 1

## 2018-06-13 MED ORDER — HYDROCODONE-ACETAMINOPHEN 5-325 MG PO TABS
ORAL_TABLET | ORAL | Status: AC
  Filled 2018-06-13: qty 2

## 2018-06-13 MED ORDER — EPHEDRINE 5 MG/ML INJ
INTRAVENOUS | Status: AC
  Filled 2018-06-13: qty 10

## 2018-06-13 MED ORDER — SODIUM CHLORIDE 0.9 % IJ SOLN
INTRAMUSCULAR | Status: AC
  Filled 2018-06-13: qty 10

## 2018-06-13 MED ORDER — PROMETHAZINE HCL 25 MG/ML IJ SOLN: 6.2500 mg | INTRAMUSCULAR | Status: DC | PRN

## 2018-06-13 MED ORDER — KCL IN DEXTROSE-NACL 20-5-0.9 MEQ/L-%-% IV SOLN
INTRAVENOUS | Status: DC
  Administered 2018-06-13: 16:00:00 via INTRAVENOUS
  Filled 2018-06-13: qty 1000

## 2018-06-13 MED ORDER — TEMAZEPAM 15 MG PO CAPS
15.0000 mg | ORAL_CAPSULE | Freq: Every day | ORAL | Status: DC
  Administered 2018-06-13: 15 mg via ORAL
  Filled 2018-06-13: qty 1

## 2018-06-13 MED ORDER — CHLORHEXIDINE GLUCONATE CLOTH 2 % EX PADS: 6.0000 | MEDICATED_PAD | Freq: Once | CUTANEOUS | Status: DC

## 2018-06-13 MED ORDER — DEXAMETHASONE SODIUM PHOSPHATE 4 MG/ML IJ SOLN
INTRAMUSCULAR | Status: DC | PRN
  Administered 2018-06-13: 10 mg via INTRAVENOUS

## 2018-06-13 MED ORDER — KETAMINE HCL 50 MG/5ML IJ SOSY
PREFILLED_SYRINGE | INTRAMUSCULAR | Status: AC
  Filled 2018-06-13: qty 10

## 2018-06-13 MED ORDER — GABAPENTIN 300 MG PO CAPS
900.0000 mg | ORAL_CAPSULE | Freq: Every evening | ORAL | Status: DC
  Administered 2018-06-13: 600 mg via ORAL
  Filled 2018-06-13 (×2): qty 3

## 2018-06-13 MED ORDER — FENTANYL CITRATE (PF) 100 MCG/2ML IJ SOLN
INTRAMUSCULAR | Status: DC | PRN
  Administered 2018-06-13 (×3): 50 ug via INTRAVENOUS
  Administered 2018-06-13: 100 ug via INTRAVENOUS

## 2018-06-13 MED ORDER — ONDANSETRON HCL 4 MG/2ML IJ SOLN
4.0000 mg | Freq: Four times a day (QID) | INTRAMUSCULAR | Status: DC | PRN
  Administered 2018-06-13: 4 mg via INTRAVENOUS
  Filled 2018-06-13: qty 2

## 2018-06-13 MED ORDER — FENTANYL CITRATE (PF) 100 MCG/2ML IJ SOLN: 25.0000 ug | INTRAMUSCULAR | Status: DC | PRN

## 2018-06-13 MED ORDER — BUPIVACAINE-EPINEPHRINE (PF) 0.25% -1:200000 IJ SOLN
INTRAMUSCULAR | Status: AC
  Filled 2018-06-13: qty 30

## 2018-06-13 MED ORDER — LIDOCAINE HCL (PF) 1 % IJ SOLN
INTRAMUSCULAR | Status: AC
  Filled 2018-06-13: qty 30

## 2018-06-13 MED ORDER — LACTATED RINGERS IV SOLN
INTRAVENOUS | Status: DC | PRN
  Administered 2018-06-13 (×2): via INTRAVENOUS

## 2018-06-13 MED ORDER — MEPERIDINE HCL 50 MG/ML IJ SOLN: 6.2500 mg | INTRAMUSCULAR | Status: DC | PRN

## 2018-06-13 SURGICAL SUPPLY — 56 items
ADH SKN CLS APL DERMABOND .7 (GAUZE/BANDAGES/DRESSINGS) ×2
APPLIER CLIP 9.375 MED OPEN (MISCELLANEOUS) ×4
APR CLP MED 9.3 20 MLT OPN (MISCELLANEOUS) ×2
BINDER BREAST LRG (GAUZE/BANDAGES/DRESSINGS) IMPLANT
BINDER BREAST XLRG (GAUZE/BANDAGES/DRESSINGS) ×1 IMPLANT
BIOPATCH RED 1 DISK 7.0 (GAUZE/BANDAGES/DRESSINGS) ×2 IMPLANT
CANISTER SUCT 3000ML PPV (MISCELLANEOUS) ×2 IMPLANT
CHLORAPREP W/TINT 26ML (MISCELLANEOUS) ×2 IMPLANT
CLIP APPLIE 9.375 MED OPEN (MISCELLANEOUS) ×1 IMPLANT
CONT SPEC 4OZ CLIKSEAL STRL BL (MISCELLANEOUS) ×4 IMPLANT
COVER SURGICAL LIGHT HANDLE (MISCELLANEOUS) ×2 IMPLANT
COVER WAND RF STERILE (DRAPES) ×1 IMPLANT
DERMABOND ADVANCED (GAUZE/BANDAGES/DRESSINGS) ×2
DERMABOND ADVANCED .7 DNX12 (GAUZE/BANDAGES/DRESSINGS) ×1 IMPLANT
DEVICE DISSECT PLASMABLAD 3.0S (MISCELLANEOUS) IMPLANT
DRAIN CHANNEL 19F RND (DRAIN) ×4 IMPLANT
DRAPE CHEST BREAST 15X10 FENES (DRAPES) ×1 IMPLANT
DRAPE LAPAROSCOPIC ABDOMINAL (DRAPES) ×1 IMPLANT
DRSG PAD ABDOMINAL 8X10 ST (GAUZE/BANDAGES/DRESSINGS) ×2 IMPLANT
DRSG TEGADERM 4X4.75 (GAUZE/BANDAGES/DRESSINGS) ×2 IMPLANT
ELECT CAUTERY BLADE 6.4 (BLADE) ×1 IMPLANT
ELECT REM PT RETURN 9FT ADLT (ELECTROSURGICAL) ×4
ELECTRODE REM PT RTRN 9FT ADLT (ELECTROSURGICAL) ×1 IMPLANT
EVACUATOR SILICONE 100CC (DRAIN) ×4 IMPLANT
GAUZE SPONGE 4X4 12PLY STRL (GAUZE/BANDAGES/DRESSINGS) ×1 IMPLANT
GLOVE BIO SURGEON STRL SZ7.5 (GLOVE) ×5 IMPLANT
GLOVE BIO SURGEON STRL SZ8 (GLOVE) ×2 IMPLANT
GLOVE BIOGEL PI IND STRL 6.5 (GLOVE) IMPLANT
GLOVE BIOGEL PI IND STRL 7.5 (GLOVE) IMPLANT
GLOVE BIOGEL PI IND STRL 8 (GLOVE) IMPLANT
GLOVE BIOGEL PI INDICATOR 6.5 (GLOVE) ×2
GLOVE BIOGEL PI INDICATOR 7.5 (GLOVE) ×1
GLOVE BIOGEL PI INDICATOR 8 (GLOVE) ×2
GOWN STRL REUS W/ TWL LRG LVL3 (GOWN DISPOSABLE) ×3 IMPLANT
GOWN STRL REUS W/ TWL XL LVL3 (GOWN DISPOSABLE) IMPLANT
GOWN STRL REUS W/TWL LRG LVL3 (GOWN DISPOSABLE) ×4
GOWN STRL REUS W/TWL XL LVL3 (GOWN DISPOSABLE) ×4
KIT BASIN OR (CUSTOM PROCEDURE TRAY) ×2 IMPLANT
KIT TURNOVER KIT B (KITS) ×2 IMPLANT
NS IRRIG 1000ML POUR BTL (IV SOLUTION) ×3 IMPLANT
PACK GENERAL/GYN (CUSTOM PROCEDURE TRAY) ×2 IMPLANT
PAD ARMBOARD 7.5X6 YLW CONV (MISCELLANEOUS) ×3 IMPLANT
PLASMABLADE 3.0S (MISCELLANEOUS) ×2
SPECIMEN JAR X LARGE (MISCELLANEOUS) ×3 IMPLANT
SPONGE LAP 18X18 RF (DISPOSABLE) ×2 IMPLANT
SUT ETHILON 3 0 FSL (SUTURE) ×4 IMPLANT
SUT MON AB 4-0 PC3 18 (SUTURE) ×7 IMPLANT
SUT VIC AB 0 CT1 27 (SUTURE) ×6
SUT VIC AB 0 CT1 27XBRD ANBCTR (SUTURE) IMPLANT
SUT VIC AB 3-0 SH 18 (SUTURE) ×4 IMPLANT
SYR CONTROL 10ML LL (SYRINGE) ×1 IMPLANT
TOWEL OR 17X24 6PK STRL BLUE (TOWEL DISPOSABLE) ×2 IMPLANT
TOWEL OR 17X26 10 PK STRL BLUE (TOWEL DISPOSABLE) ×2 IMPLANT
TRAY FOLEY W/BAG SLVR 16FR (SET/KITS/TRAYS/PACK) ×2
TRAY FOLEY W/BAG SLVR 16FR ST (SET/KITS/TRAYS/PACK) IMPLANT
TUBE CONNECTING 12X1/4 (SUCTIONS) ×1 IMPLANT

## 2018-06-13 NOTE — Interval H&P Note (Signed)
History and Physical Interval Note:  06/13/2018 7:20 AM  Dawn Booker  has presented today for surgery, with the diagnosis of LEFT BREAST CANCER  The various methods of treatment have been discussed with the patient and family. After consideration of risks, benefits and other options for treatment, the patient has consented to  Procedure(s): LEFT MASTECTOMY WITH SENTINEL LYMPH NODE MAPPING AND TARGETED NODE DISSECTION AND RIGHT PROPHYLATIC MASTECTOMY (Bilateral) as a surgical intervention .  The patient's history has been reviewed, patient examined, no change in status, stable for surgery.  I have reviewed the patient's chart and labs.  Questions were answered to the patient's satisfaction.     [name]

## 2018-06-13 NOTE — Anesthesia Procedure Notes (Signed)
Anesthesia Regional Block: Pectoralis block   Pre-Anesthetic Checklist: ,, timeout performed, Correct Patient, Correct Site, Correct Laterality, Correct Procedure, Correct Position, site marked, Risks and benefits discussed,  Surgical consent,  Pre-op evaluation,  At surgeon's request and post-op pain management  Laterality: Left and Upper  Prep: chloraprep       Needles:  Injection technique: Single-shot  Needle Type: Echogenic Stimulator Needle     Needle Length: 10cm  Needle Gauge: 21   Needle insertion depth: 2 cm   Additional Needles:   Procedures:,,,, ultrasound used (permanent image in chart),,,,  Narrative:  Start time: 06/13/2018 7:10 AM End time: 06/13/2018 7:20 AM Injection made incrementally with aspirations every 5 mL.  Performed by: Personally  Anesthesiologist: Lyn Hollingshead, MD

## 2018-06-13 NOTE — Transfer of Care (Signed)
Immediate Anesthesia Transfer of Care Note  Patient: Dawn Booker  Procedure(s) Performed: LEFT MASTECTOMY WITH SENTINEL LYMPH NODE MAPPING AND TARGETED NODE DISSECTION AND RIGHT PROPHYLATIC MASTECTOMY (Bilateral Breast)  Patient Location: PACU  Anesthesia Type:General  Level of Consciousness: sedated  Airway & Oxygen Therapy: Patient Spontanous Breathing and Patient connected to nasal cannula oxygen  Post-op Assessment: Report given to RN and Post -op Vital signs reviewed and stable  Post vital signs: Reviewed and stable  Last Vitals:  Vitals Value Taken Time  BP 134/86 06/13/2018 11:00 AM  Temp    Pulse 109 06/13/2018 11:00 AM  Resp 12 06/13/2018 11:00 AM  SpO2 99 % 06/13/2018 11:00 AM  Vitals shown include unvalidated device data.  Last Pain:  Vitals:   06/13/18 0627  TempSrc:   PainSc: 0-No pain         Complications: No apparent anesthesia complications

## 2018-06-13 NOTE — Anesthesia Procedure Notes (Signed)
Procedure Name: Intubation Date/Time: 06/13/2018 7:49 AM Performed by: Lieutenant Diego, CRNA Pre-anesthesia Checklist: Patient identified, Emergency Drugs available, Suction available and Patient being monitored Patient Re-evaluated:Patient Re-evaluated prior to induction Oxygen Delivery Method: Circle system utilized Preoxygenation: Pre-oxygenation with 100% oxygen Induction Type: IV induction Ventilation: Mask ventilation without difficulty Laryngoscope Size: Miller and 2 Grade View: Grade I Tube type: Oral Tube size: 7.0 mm Number of attempts: 1 Airway Equipment and Method: Stylet and Oral airway Placement Confirmation: ETT inserted through vocal cords under direct vision,  positive ETCO2 and breath sounds checked- equal and bilateral Secured at: 23 cm Tube secured with: Tape Dental Injury: Teeth and Oropharynx as per pre-operative assessment

## 2018-06-13 NOTE — H&P (Signed)
Dawn Booker  Location: [facility] Surgery Patient #: [id number] DOB: September 18, 1955 Married / Language: English / Race: White Female   History of Present Illness  The patient is a 62 year old female who presents with breast cancer. We are asked to see the patient in consultation by Dr. Isidore Moos to evaluate her for a new left breast cancer. The patient is a 62 year old white female who presents with a screen detected mass in the central left breast. 2 were seen on u/s that measured 13m and 2.6cm with a small one in between. 2 were biopsied as well as 1 abnormal lymph node and all were + for grade 2 ILC that was Er and Pr + and Her2 - with a Ki67 of 10%. She quit smoking 3 months ago and hasn't had a mammogram in 3 years.   Past Surgical History  Breast Biopsy  Left. Breast Mass; Local Excision  Left. Hysterectomy (due to cancer) - Partial  Thyroid Surgery   Diagnostic Studies History Colonoscopy  1-5 years ago Mammogram  within last year Pap Smear  >5 years ago  Medication History  Medications Reconciled  Social History  Alcohol use  Occasional alcohol use. Caffeine use  Carbonated beverages, Coffee, Tea. No drug use  Tobacco use  Former smoker.  Family History  Anesthetic complications  Mother. Arthritis  Family Members In General, Mother. Breast Cancer  Family Members In General, Mother. Cerebrovascular Accident  [place] In [place]. Depression  Brother, Father, Sister. Diabetes Mellitus  Family Members In General. Heart Disease  Mother. Heart disease in female family member before age 62 Hypertension  Mother. Melanoma  Mother. Respiratory Condition  Sister. Thyroid problems  Mother.  Pregnancy / Birth History  Age at menarche  89years. Age of menopause  469-50Contraceptive History  Oral contraceptives. Gravida  2 Maternal age  62-25Para  2 Regular periods   Other  Problems  Anxiety Disorder  Arthritis  Back Pain  Chest pain  Depression  Gastroesophageal Reflux Disease  Heart murmur  Hemorrhoids  Hypercholesterolemia  Migraine Headache  Other disease, cancer, significant illness  Thyroid Disease     Review of Systems  General Present- Fatigue and Night Sweats. Not Present- Appetite Loss, Chills, Fever, Weight Gain and Weight Loss. Skin Not Present- Change in Wart/Mole, Dryness, Hives, Jaundice, New Lesions, Non-Healing Wounds, Rash and Ulcer. HEENT Present- Hearing Loss, Hoarseness and Sinus Pain. Not Present- Earache, Nose Bleed, Oral Ulcers, Ringing in the Ears, Seasonal Allergies, Sore Throat, Visual Disturbances, Wears glasses/contact lenses and Yellow Eyes. Respiratory Present- Chronic Cough and Snoring. Not Present- Bloody sputum, Difficulty Breathing and Wheezing. Breast Not Present- Breast Mass, Breast Pain, Nipple Discharge and Skin Changes. Cardiovascular Present- Leg Cramps, Palpitations and Swelling of Extremities. Not Present- Chest Pain, Difficulty Breathing Lying Down, Rapid Heart Rate and Shortness of Breath. Gastrointestinal Present- Abdominal Pain, Bloating, Difficulty Swallowing, Hemorrhoids and Nausea. Not Present- Bloody Stool, Change in Bowel Habits, Chronic diarrhea, Constipation, Excessive gas, Gets full quickly at meals, Indigestion, Rectal Pain and Vomiting. Female Genitourinary Not Present- Frequency, Nocturia, Painful Urination, Pelvic Pain and Urgency. Musculoskeletal Present- Back Pain, Joint Pain and Muscle Weakness. Not Present- Joint Stiffness, Muscle Pain and Swelling of Extremities. Neurological Present- Decreased Memory and Headaches. Not Present- Fainting, Numbness, Seizures, Tingling, Tremor, Trouble walking and Weakness. Psychiatric Present- Anxiety and Depression. Not Present- Bipolar, Change in Sleep Pattern, Fearful and Frequent crying. Endocrine Present- Hot flashes. Not  Present- Cold  Intolerance, Excessive Hunger, Hair Changes, Heat Intolerance and New Diabetes. Hematology Not Present- Blood Thinners, Easy Bruising, Excessive bleeding, Gland problems, HIV and Persistent Infections.   Physical Exam  General Mental Status-Alert. General Appearance-Consistent with stated age. Hydration-Well hydrated. Voice-Normal.  Head and Neck Head-normocephalic, atraumatic with no lesions or palpable masses. Trachea-midline. Thyroid Gland Characteristics - normal size and consistency.  Eye Eyeball - Bilateral-Extraocular movements intact. Sclera/Conjunctiva - Bilateral-No scleral icterus.  Chest and Lung Exam Chest and lung exam reveals -quiet, even and easy respiratory effort with no use of accessory muscles and on auscultation, normal breath sounds, no adventitious sounds and normal vocal resonance. Inspection Chest Wall - Normal. Back - normal.  Breast Note: there is no palpable mass in either breast. there is no palpable axillary, supraclavicular, or cervical lymphadenopathy   Cardiovascular Cardiovascular examination reveals -normal heart sounds, regular rate and rhythm with no murmurs and normal pedal pulses bilaterally.  Abdomen Inspection Inspection of the abdomen reveals - No Hernias. Skin - Scar - no surgical scars. Palpation/Percussion Palpation and Percussion of the abdomen reveal - Soft, Non Tender, No Rebound tenderness, No Rigidity (guarding) and No hepatosplenomegaly. Auscultation Auscultation of the abdomen reveals - Bowel sounds normal.  Neurologic Neurologic evaluation reveals -alert and oriented x 3 with no impairment of recent or remote memory. Mental Status-Normal.  Musculoskeletal Normal Exam - Left-Upper Extremity Strength Normal and Lower Extremity Strength Normal. Normal Exam - Right-Upper Extremity Strength Normal and Lower Extremity Strength Normal.  Lymphatic Head & Neck  General Head & Neck Lymphatics:  Bilateral - Description - Normal. Axillary  General Axillary Region: Bilateral - Description - Normal. Tenderness - Non Tender. Femoral & Inguinal  Generalized Femoral & Inguinal Lymphatics: Bilateral - Description - Normal. Tenderness - Non Tender.    Assessment & Plan  MALIGNANT NEOPLASM OF CENTRAL PORTION OF LEFT BREAST IN FEMALE, ESTROGEN RECEPTOR POSITIVE (C50.112) Impression: The patient appears to have a moderate size cancer in the central left breast with a positive lymph node. I have talked to her about the options for treatment and at this point she favors bilateral mastectomy given her strong family history and possibly a targeted node dissection since only 1 node was seen. I have discussed with her in detail the risks and benefits of the surgery as well as some of the technical aspects and she understands and wishes to proceed. she does not desire reconstruction

## 2018-06-13 NOTE — Anesthesia Procedure Notes (Signed)
Anesthesia Regional Block: Pectoralis block   Pre-Anesthetic Checklist: ,, timeout performed, Correct Patient, Correct Site, Correct Laterality, Correct Procedure, Correct Position, site marked, Risks and benefits discussed,  Surgical consent,  Pre-op evaluation,  At surgeon's request and post-op pain management  Laterality: Right  Prep: chloraprep       Needles:  Injection technique: Single-shot  Needle Type: Echogenic Stimulator Needle     Needle Length: 10cm  Needle Gauge: 21   Needle insertion depth: 2.5 cm   Additional Needles:   Procedures:,,,, ultrasound used (permanent image in chart),,,,  Narrative:  Start time: 06/13/2018 7:00 AM End time: 06/13/2018 7:10 AM Injection made incrementally with aspirations every 5 mL.  Performed by: Personally  Anesthesiologist: Lyn Hollingshead, MD

## 2018-06-13 NOTE — Anesthesia Preprocedure Evaluation (Addendum)
Anesthesia Evaluation  Patient identified by MRN, date of birth, ID band Patient awake    Reviewed: Allergy & Precautions, NPO status , Patient's Chart, lab work & pertinent test results  Airway Mallampati: I       Dental no notable dental hx. (+) Teeth Intact   Pulmonary former smoker,    Pulmonary exam normal breath sounds clear to auscultation       Cardiovascular Normal cardiovascular exam Rhythm:Regular Rate:Normal     Neuro/Psych PSYCHIATRIC DISORDERS Anxiety Depression Bipolar Disorder    GI/Hepatic GERD  Medicated,  Endo/Other    Renal/GU   negative genitourinary   Musculoskeletal   Abdominal Normal abdominal exam  (+)   Peds  Hematology   Anesthesia Other Findings   Reproductive/Obstetrics                            Anesthesia Physical Anesthesia Plan  ASA: II  Anesthesia Plan: General   Post-op Pain Management:  Regional for Post-op pain   Induction: Intravenous  PONV Risk Score and Plan: 4 or greater  Airway Management Planned: Oral ETT  Additional Equipment:   Intra-op Plan:   Post-operative Plan: Extubation in OR  Informed Consent: I have reviewed the patients History and Physical, chart, labs and discussed the procedure including the risks, benefits and alternatives for the proposed anesthesia with the patient or authorized representative who has indicated his/her understanding and acceptance.   Dental advisory given  Plan Discussed with: CRNA and Surgeon  Anesthesia Plan Comments:        Anesthesia Quick Evaluation

## 2018-06-13 NOTE — Op Note (Signed)
06/13/2018  10:44 AM  PATIENT:  Dawn Booker  62 y.o. female  PRE-OPERATIVE DIAGNOSIS:  LEFT BREAST CANCER  POST-OPERATIVE DIAGNOSIS:  LEFT BREAST CANCER  PROCEDURE:  Procedure(s): LEFT MASTECTOMY WITH DEEP LEFT AXILLARY SENTINEL LYMPH NODE MAPPING AND TARGETED NODE DISSECTION WITH RADIOACTIVE SEED AND RIGHT PROPHYLATIC MASTECTOMY (Bilateral)  SURGEON:  Surgeon(s) and Role:    * Jovita Kussmaul, MD - Primary  PHYSICIAN ASSISTANT:   ASSISTANTS: Sharyn Dross, RNFA   ANESTHESIA:   general  EBL:  50 mL   BLOOD ADMINISTERED:none  DRAINS: (2) Jackson-Pratt drain(s) with closed bulb suction in the prepectoral space   LOCAL MEDICATIONS USED:  NONE  SPECIMEN:  Source of Specimen:  right mastectomy, left mastectomy, targeted node, sentinel nodes X 4  DISPOSITION OF SPECIMEN:  PATHOLOGY  COUNTS:  YES  TOURNIQUET:  * No tourniquets in log *  DICTATION: .Dragon Dictation   After informed consent was obtained the patient was brought to the operating room and placed in the supine position on the operating table.  After adequate induction of general anesthesia the patient's bilateral chest, breast, and axillary areas were prepped with ChloraPrep, allowed to dry, and draped in usual sterile manner.  An appropriate timeout was performed.  Previously an I-125 seed was placed in the left axilla to mark a positive lymph node.  Earlier in the day the patient also underwent injection of 1 mCi technetium sulfur colloid in the subareolar position on the left.  Attention was first turned to the right breast.  This was the benign side.  An elliptical incision was made around the nipple and areola complex in order to minimize the excess skin.  The incision was carried through the skin and subcutaneous tissue sharply with the plasma blade.  Breast hooks were used to elevate the skin flaps anteriorly towards the saline.  Thin skin flaps were then created circumferentially by dissection between the  subcutaneous tissue and the breast tissue with the plasma blade.  This dissection was carried all the way to the chest wall circumferentially.  Next the breast was removed from the pectoralis muscle with the pectoralis fascia.  Once this was accomplished the breast was removed from the patient.  It was marked with a stitch on the lateral skin and sent to pathology.  Hemostasis was achieved using the plasma blade.  The wound was irrigated with copious amounts of saline.  The lateral axillary tissue was then reattached to the chest wall with a running 0 Vicryl stitch.  Next a small stab incision was made near the anterior axillary line inferior to the operative bed.  A tonsil clamp was placed through this opening and used to bring a 19 [place] round Blake drain into the operative bed.  The drain was curled along the chest wall.  The drain was anchored to the skin with a 3-0 nylon stitch.  Next the superior and inferior skin flaps were grossly reapproximated with interrupted 3-0 Vicryl stitches.  The skin was then closed with a running 4-0 Monocryl subcuticular stitch.  The skin flaps appeared healthy.  The drain was placed to bulb suction and there was a good seal.  Attention was then turned to the left breast.  A similar elliptical incision was made around the nipple and areola complex in order to minimize the excess skin.  The incision was carried through the skin and subcutaneous tissue sharply using the plasma blade.  Breast hooks were then used to elevate the skin flaps anteriorly towards  the saline.  Thin skin flaps were then created circumferentially by dissection with the plasma blade between the breast tissue in the subcutaneous fat.  This dissection was carried all the way to the chest wall circumferentially.  Next the breast was removed from the pectoralis muscle with the pectoralis fascia.  Once this was accomplished the breast was able to be removed from the patient.  The lateral axillary tissue was  examined with the neoprobe set to I-125 in the area of the radioactive seed was readily identified.  This was excised sharply with the plasma blade.  A specimen radiograph was obtained that showed the clip and seed to be right with the lymph node.  This tissue was then sent to pathology for further evaluation as the targeted node.  Next the neoprobe was set to technetium and an area of radioactivity was readily identified in the left axilla.  Dissection towards the area of radioactivity was carried out under the direction of the neoprobe with the plasma blade so that the deep left axillary space was entered.  I was able to identify one hot node with ex vivo counts around 500 which was excised sharply with the plasma blade and the lymphatics and small vessels were controlled with clips.  I was able to identify 3 other palpable lymph nodes in the general area that were also removed sharply with the plasma blade and lymphatics were controlled with clips.  No other hot or palpable lymph nodes were identified in the left axilla.  The wound was irrigated with copious amounts of saline and hemostasis was achieved using the plasma blade.  The lateral axillary tissue was then reattached to the chest wall with a running 0 Vicryl stitch.  A small stab incision was made near the anterior axillary line inferior to the operative bed.  A tonsil clamp was placed through this opening and used to bring a 19 [place] round Blake drain into the operative bed.  The drain was curled along the chest wall and the end of the drain was placed in the axilla.  The drain was anchored to the skin with a 3-0 nylon stitch.  Next the superior and inferior skin flaps were grossly reapproximated with interrupted 3-0 Vicryl stitches.  The skin was then closed with a running 4-0 Monocryl subcuticular stitch.  The drain was placed to bulb suction and there was a good seal.  Dermabond dressings were applied.  The patient tolerated the procedure well.  The  skin flaps all appeared healthy and viable.  At the end of the case all needle sponge and instrument counts were correct.  Sterile dressings were also applied.  The patient tolerated the procedure well.  At the end of the case all needle sponge and instrument counts were correct.  The patient was then awakened and taken to recovery in stable condition  PLAN OF CARE: Admit for overnight observation  PATIENT DISPOSITION:  PACU - hemodynamically stable.   Delay start of Pharmacological VTE agent (>24hrs) due to surgical blood loss or risk of bleeding: no

## 2018-06-14 DIAGNOSIS — C773 Secondary and unspecified malignant neoplasm of axilla and upper limb lymph nodes: Secondary | ICD-10-CM | POA: Diagnosis not present

## 2018-06-14 DIAGNOSIS — Z79899 Other long term (current) drug therapy: Secondary | ICD-10-CM | POA: Diagnosis not present

## 2018-06-14 DIAGNOSIS — E7800 Pure hypercholesterolemia, unspecified: Secondary | ICD-10-CM | POA: Diagnosis not present

## 2018-06-14 DIAGNOSIS — K219 Gastro-esophageal reflux disease without esophagitis: Secondary | ICD-10-CM | POA: Diagnosis not present

## 2018-06-14 DIAGNOSIS — F329 Major depressive disorder, single episode, unspecified: Secondary | ICD-10-CM | POA: Diagnosis not present

## 2018-06-14 DIAGNOSIS — Z170 Estrogen receptor positive status [ER+]: Secondary | ICD-10-CM | POA: Diagnosis not present

## 2018-06-14 DIAGNOSIS — Z87891 Personal history of nicotine dependence: Secondary | ICD-10-CM | POA: Diagnosis not present

## 2018-06-14 DIAGNOSIS — C50112 Malignant neoplasm of central portion of left female breast: Secondary | ICD-10-CM | POA: Diagnosis not present

## 2018-06-14 DIAGNOSIS — F419 Anxiety disorder, unspecified: Secondary | ICD-10-CM | POA: Diagnosis not present

## 2018-06-14 NOTE — Discharge Instructions (Signed)
Total or Modified Radical Mastectomy, Care After °Refer to this sheet in the next few weeks. These instructions provide you with information about caring for yourself after your procedure. Your health care provider may also give you more specific instructions. Your treatment has been planned according to current medical practices, but problems sometimes occur. Call your health care provider if you have any problems or questions after your procedure. °What can I expect after the procedure? °After your procedure, it is common to have: °· Pain. °· Numbness. °· Stiffness in your arm or shoulder. °· Feelings of stress, sadness, or depression. ° °If the lymph nodes under your arm were removed, you may have arm swelling, weakness, or numbness on the same side of your body as your surgery. °Follow these instructions at home: °Incision care °· There are many different ways to close and cover an incision, including stitches, skin glue, and adhesive strips. Follow your health care provider's instructions about: °? Incision care. °? Bandage (dressing) changes and removal. °? Incision closure removal. °· Check your incision area every day for signs of infection. Watch for: °? Redness, swelling, or pain. °? Fluid, blood, or pus. °· If you were sent home with a surgical drain in place, follow your health care provider's instructions for emptying it. °Bathing °· Do not take baths, swim, or use a hot tub until your health care provider approves. °· Take sponge baths until your health care provider says that you can start showering or bathing. °Activity °· Return to your normal activities as directed by your health care provider. °· Avoid strenuous exercise. °· Be careful to avoid any activities that could cause an injury to your arm on the side of your surgery. °· Do not lift anything that is heavier than 10 lb (4.5 kg). Avoid lifting with the arm that is on the side of your surgery. °· Do not carry heavy objects on your  shoulder. °· After your drain is removed, you should perform exercises to keep your arm from getting stiff and swollen. Talk with your health care provider about which exercises are safe for you. °General instructions °· Take medicines only as directed by your health care provider. °· You may eat what you usually do. °· Keep your arm elevated when at rest. °· Do not wear tight jewelry on your arm, wrist, or fingers on the side of your surgery. °· Get checked for extra fluid around your lymph nodes (lymphedema) as often as told by your health care provider. °· If you had a modified radical mastectomy, always let your health care providers know that lymph nodes under your arm were removed. This is important information to share before you are involved in certain procedures, such as giving blood or having your blood pressure taken. °Contact a health care provider if: °· You have a fever. °· Your pain medicine is not working. °· Your arm swelling, weakness, or numbness has not improved after a few weeks. °· You have new swelling in your breast or arm. °· You have redness, swelling, or pain in your incision area. °· You have fluid, blood, or pus coming from your incision. °Get help right away if: °· You have very bad pain in your breast or arm. °· You have chest pain. °· You have difficulty breathing. °This information is not intended to replace advice given to you by your health care provider. Make sure you discuss any questions you have with your health care provider. °Document Released: 04/12/2004 Document Revised: 04/26/2016   Document Reviewed: 05/05/2014 Elsevier Interactive Patient Education  [employer].

## 2018-06-14 NOTE — Discharge Summary (Signed)
Physician Discharge Summary  Patient ID: Dawn Booker MRN: [id number] DOB/AGE: 1956-04-22 62 y.o.  PCP: Lawerance Cruel, MD  Admit date: 06/13/2018 Discharge date: 06/14/2018  Admission Diagnoses:  Left breast cancer  Discharge Diagnoses:  same  Active Problems:   Cancer of left female breast  [facility])   Surgery:  Bilateral mastectomies  Discharged Condition: stable  Hospital Course:   Had surgery by Dr. Marlou Starks on Friday.  Kept overnight.  Minimal drainage from JP drains.  Ready to go home on Saturday  Consults: none  Significant Diagnostic Studies: path pending    Discharge Exam: Blood pressure 114/72, pulse 91, temperature 98 F (36.7 C), temperature source Oral, resp. rate 17, height 5\' 5"  (1.651 m), weight 70.6 kg, SpO2 99 %. Incisions covered and JPs in place  Disposition: Discharge disposition: 01-Home or Self Care       Discharge Instructions    Call MD for:  redness, tenderness, or signs of infection (pain, swelling, redness, odor or green/yellow discharge around incision site)   Complete by:  As directed    Diet - low sodium heart healthy   Complete by:  As directed    Increase activity slowly   Complete by:  As directed      Allergies as of 06/14/2018      Reactions   Lamictal [lamotrigine] Other (See Comments)   Katherina Right Syndrome   Sulfonamide Derivatives Other (See Comments)   UNSPECIFIED REACTION    Brintellix [vortioxetine] Anxiety, Other (See Comments)   Increased anxiety   Corticosteroids Rash   Morphine Other (See Comments)   TACHYCARDIA   Other Rash   "MYCINS"   Prednisone Rash      Zoloft [sertraline] Anxiety, Other (See Comments)   Increased anxiety      Medication List    TAKE these medications   acetaminophen 500 MG tablet Commonly known as:  TYLENOL Take 1,000 mg by mouth at bedtime.   buPROPion 150 MG 24 hr tablet Commonly known as:  WELLBUTRIN XL Take 150 mg by mouth daily.   diphenhydrAMINE 25 mg  capsule Commonly known as:  BENADRYL Take 25 mg by mouth at bedtime.   gabapentin 300 MG capsule Commonly known as:  NEURONTIN 2 capsules 3 times daily, 3 capsules at night What changed:    how much to take  how to take this  when to take this  additional instructions   HYDROcodone-acetaminophen 5-325 MG tablet Commonly known as:  NORCO/VICODIN Take 1-2 tablets by mouth every 6 (six) hours as needed for moderate pain or severe pain.   hydrOXYzine 25 MG capsule Commonly known as:  VISTARIL Take 25 mg by mouth as needed for anxiety.   ibuprofen 600 MG tablet Commonly known as:  ADVIL,MOTRIN Take 1 tablet (600 mg total) by mouth every 6 (six) hours as needed.   omeprazole 40 MG capsule Commonly known as:  PRILOSEC Take 40 mg by mouth daily.   rosuvastatin 10 MG tablet Commonly known as:  CRESTOR Take 1 tablet (10 mg total) by mouth daily.   temazepam 30 MG capsule Commonly known as:  RESTORIL Take 15 mg by mouth at bedtime.   traMADol 50 MG tablet Commonly known as:  ULTRAM Take 1-2 tablets (50-100 mg total) by mouth every 6 (six) hours as needed.   Vitamin D 2000 units Caps Take 2,000 Units by mouth daily.      Follow-up Information    [name], MD In 2 weeks.   Specialty:  General Surgery Contact information: [address] [place] [id number] [phone]           Signed: Pedro Earls 06/14/2018, 8:22 AM

## 2018-06-15 MED FILL — ROSUVASTATIN CALCIUM 10 MG: 10 | 90 days supply | Qty: 90 | Fill #1

## 2018-06-16 ENCOUNTER — Encounter (HOSPITAL_COMMUNITY): Payer: Self-pay | Admitting: General Surgery

## 2018-06-16 DIAGNOSIS — Z9011 Acquired absence of right breast and nipple: Secondary | ICD-10-CM | POA: Diagnosis not present

## 2018-06-16 DIAGNOSIS — C50112 Malignant neoplasm of central portion of left female breast: Secondary | ICD-10-CM | POA: Diagnosis not present

## 2018-06-16 NOTE — Anesthesia Postprocedure Evaluation (Signed)
Anesthesia Post Note  Patient: Dawn Booker  Procedure(s) Performed: LEFT MASTECTOMY WITH SENTINEL LYMPH NODE MAPPING AND TARGETED NODE DISSECTION AND RIGHT PROPHYLATIC MASTECTOMY (Bilateral Breast)     Patient location during evaluation: PACU Anesthesia Type: General Level of consciousness: sedated Pain management: pain level controlled Vital Signs Assessment: post-procedure vital signs reviewed and stable Respiratory status: spontaneous breathing Cardiovascular status: stable Postop Assessment: no apparent nausea or vomiting Anesthetic complications: no    Last Vitals:  Vitals:   06/14/18 0424 06/14/18 0921  BP: 114/72 121/75  Pulse: 91 90  Resp: 17 16  Temp: 36.7 C 36.8 C  SpO2: 99% 98%    Last Pain:  Vitals:   06/14/18 0921  TempSrc: Oral  PainSc:    Pain Goal:                 Emanuele Mcwhirter Barron Alvine

## 2018-06-17 ENCOUNTER — Telehealth: Payer: Self-pay | Admitting: Hematology and Oncology

## 2018-06-17 NOTE — Telephone Encounter (Signed)
Printed medical records for [facility], Release [id number]

## 2018-06-19 NOTE — Progress Notes (Signed)
FMLA successfully faxed to [employer] at [phone]. Mailed copy to patient address on file. 

## 2018-06-24 ENCOUNTER — Telehealth: Payer: Self-pay | Admitting: *Deleted

## 2018-06-24 ENCOUNTER — Inpatient Hospital Stay: Payer: 59 | Attending: Hematology and Oncology | Admitting: Hematology and Oncology

## 2018-06-24 DIAGNOSIS — Z170 Estrogen receptor positive status [ER+]: Secondary | ICD-10-CM | POA: Insufficient documentation

## 2018-06-24 DIAGNOSIS — Z9013 Acquired absence of bilateral breasts and nipples: Secondary | ICD-10-CM | POA: Insufficient documentation

## 2018-06-24 DIAGNOSIS — C50812 Malignant neoplasm of overlapping sites of left female breast: Secondary | ICD-10-CM | POA: Insufficient documentation

## 2018-06-24 NOTE — Telephone Encounter (Signed)
Received order for mammaprint testing. Requisition faxed to pathology and [facility] 

## 2018-06-24 NOTE — Assessment & Plan Note (Signed)
06/13/2018:Bilateral mastectomies: Left: ILC, grade 2, 5.4 cm, LCIS, margins negative, lymphovascular invasion present, 2/7 lymph nodes positive with extranodal extension (1 additional lymph node had isolated tumor cells), right mastectomy benign; T3N1a ER 100%, PR 10%, HER-2 -1+, Ki-67 10 to 15%, stage Ib  Pathology counseling: I discussed the final pathology report of the patient provided  a copy of this report. I discussed the margins as well as lymph node surgeries. We also discussed the final staging along with previously performed ER/PR and HER-2/neu testing.  Treatment plan: 1. Mammaprint testing and the final pathology 2.  Adjuvant radiation therapy 3.  Adjuvant antiestrogen therapy with letrozole 2.5 mg daily x5 to 7 years  Return to clinic based upon Mammaprint test result

## 2018-06-24 NOTE — Progress Notes (Signed)
Patient Care Team: Lawerance Cruel, MD as PCP - General (Family Medicine) Nicholas Lose, MD as Consulting Physician (Hematology and Oncology) Jovita Kussmaul, MD as Consulting Physician (General Surgery) Eppie Gibson, MD as Attending Physician (Radiation Oncology)  DIAGNOSIS:  Encounter Diagnosis  Name Primary?  . Malignant neoplasm of overlapping sites of left breast in female, estrogen receptor positive ([place])     SUMMARY OF ONCOLOGIC HISTORY:   Malignant neoplasm of overlapping sites of left breast in female, estrogen receptor positive ([place])   05/06/2018 Initial Diagnosis    Screening detected left breast mass anteriorly 1030 to 11 o'clock position 2 masses 6 mm and 7 mm, 12:30 position 2.6 cm both of these masses biopsy-proven grade 2 invasive lobular cancer ER 100%, PR 10%, Ki-67 15%, HER-2 negative 1+ by IHC, lymph node positive for malignancy T2N1 stage IIa clinical stage    05/21/2018 Cancer Staging    Staging form: Breast, AJCC 8th Edition - Pathologic: Stage IB (pT3, pN1a, cM0, G2, ER+, PR+, HER2-) - Signed by Nicholas Lose, MD on 06/24/2018    06/03/2018 Miscellaneous    Genetics negative    06/13/2018 Surgery    Bilateral mastectomies: Left: ILC, grade 2, 5.4 cm, LCIS, margins negative, lymphovascular invasion present, 2/7 lymph nodes positive with extranodal extension (1 additional lymph node had isolated tumor cells), right mastectomy benign; T3N1a ER 100%, PR 10%, HER-2 -1+, Ki-67 10 to 15%, stage Ib     CHIEF COMPLIANT: Follow-up after recent bilateral mastectomies for breast cancer  INTERVAL HISTORY: Dawn Booker is a 62 year old with above-mentioned history of left breast cancer underwent bilateral mastectomies and is here today accompanied by her daughter to discuss pathology report.  She is doing extremely well from recent surgery.  She is not in a lot of pain.  REVIEW OF SYSTEMS:   Constitutional: Denies fevers, chills or abnormal weight loss Eyes: Denies  blurriness of vision Ears, nose, mouth, throat, and face: Denies mucositis or sore throat Respiratory: Denies cough, dyspnea or wheezes Cardiovascular: Denies palpitation, chest discomfort Gastrointestinal:  Denies nausea, heartburn or change in bowel habits Skin: Denies abnormal skin rashes Lymphatics: Denies new lymphadenopathy or easy bruising Neurological:Denies numbness, tingling or new weaknesses Behavioral/Psych: Mood is stable, no new changes  Extremities: No lower extremity edema Breast: Bilateral mastectomies All other systems were reviewed with the patient and are negative.  I have reviewed the past medical history, past surgical history, social history and family history with the patient and they are unchanged from previous note.  ALLERGIES:  is allergic to lamictal [lamotrigine]; sulfonamide derivatives; brintellix [vortioxetine]; corticosteroids; morphine; other; prednisone; and zoloft [sertraline].  MEDICATIONS:  Current Outpatient Medications  Medication Sig Dispense Refill  . acetaminophen (TYLENOL) 500 MG tablet Take 1,000 mg by mouth at bedtime.     Marland Kitchen buPROPion (WELLBUTRIN XL) 150 MG 24 hr tablet Take 150 mg by mouth daily.     . Cholecalciferol (VITAMIN D) 2000 units CAPS Take 2,000 Units by mouth daily.     . diphenhydrAMINE (BENADRYL) 25 mg capsule Take 25 mg by mouth at bedtime.    . gabapentin (NEURONTIN) 300 MG capsule 2 capsules 3 times daily, 3 capsules at night (Patient taking differently: Take 900 mg by mouth every evening. ) 270 capsule 5  . HYDROcodone-acetaminophen (NORCO/VICODIN) 5-325 MG tablet Take 1-2 tablets by mouth every 6 (six) hours as needed for moderate pain or severe pain. 15 tablet 0  . hydrOXYzine (VISTARIL) 25 MG capsule Take 25 mg by mouth  as needed for anxiety.    Marland Kitchen ibuprofen (ADVIL,MOTRIN) 600 MG tablet Take 1 tablet (600 mg total) by mouth every 6 (six) hours as needed. (Patient not taking: Reported on 06/02/2018) 30 tablet 0  . omeprazole  (PRILOSEC) 40 MG capsule Take 40 mg by mouth daily.  4  . rosuvastatin (CRESTOR) 10 MG tablet Take 1 tablet (10 mg total) by mouth daily. 30 tablet 11  . temazepam (RESTORIL) 30 MG capsule Take 15 mg by mouth at bedtime.     . traMADol (ULTRAM) 50 MG tablet Take 1-2 tablets (50-100 mg total) by mouth every 6 (six) hours as needed. 20 tablet 1   No current facility-administered medications for this visit.     PHYSICAL EXAMINATION: ECOG PERFORMANCE STATUS: 1 - Symptomatic but completely ambulatory  Vitals:   06/24/18 1406  BP: 135/77  Pulse: 95  Resp: 18  Temp: (!) 97.5 F (36.4 C)  SpO2: 100%   Filed Weights   06/24/18 1406  Weight: 149 lb 9.6 oz (67.9 kg)    GENERAL:alert, no distress and comfortable SKIN: skin color, texture, turgor are normal, no rashes or significant lesions EYES: normal, Conjunctiva are pink and non-injected, sclera clear OROPHARYNX:no exudate, no erythema and lips, buccal mucosa, and tongue normal  NECK: supple, thyroid normal size, non-tender, without nodularity LYMPH:  no palpable lymphadenopathy in the cervical, axillary or inguinal LUNGS: clear to auscultation and percussion with normal breathing effort HEART: regular rate & rhythm and no murmurs and no lower extremity edema ABDOMEN:abdomen soft, non-tender and normal bowel sounds MUSCULOSKELETAL:no cyanosis of digits and no clubbing  NEURO: alert & oriented x 3 with fluent speech, no focal motor/sensory deficits EXTREMITIES: No lower extremity edema   LABORATORY DATA:  I have reviewed the data as listed CMP Latest Ref Rng & Units 06/06/2018 05/21/2018 09/30/2013  Glucose 70 - 99 mg/dL 79 87 95  BUN 8 - 23 mg/dL '10 10 13  ' Creatinine 0.44 - 1.00 mg/dL 0.76 0.94 0.61  Sodium 135 - 145 mmol/L 139 142 142  Potassium 3.5 - 5.1 mmol/L 3.6 3.8 3.7  Chloride 98 - 111 mmol/L 107 105 105  CO2 22 - 32 mmol/L '26 28 24  ' Calcium 8.9 - 10.3 mg/dL 9.2 9.5 9.0  Total Protein 6.5 - 8.1 g/dL - 7.4 7.2  Total  Bilirubin 0.3 - 1.2 mg/dL - 0.5 0.3  Alkaline Phos 38 - 126 U/L - 73 55  AST 15 - 41 U/L - 16 16  ALT 0 - 44 U/L - 17 15    Lab Results  Component Value Date   WBC 5.4 06/06/2018   HGB 13.7 06/06/2018   HCT 42.4 06/06/2018   MCV 96.8 06/06/2018   PLT 195 06/06/2018   NEUTROABS 4.1 05/21/2018    ASSESSMENT & PLAN:  Malignant neoplasm of overlapping sites of left breast in female, estrogen receptor positive ([place]) 06/13/2018:Bilateral mastectomies: Left: ILC, grade 2, 5.4 cm, LCIS, margins negative, lymphovascular invasion present, 2/7 lymph nodes positive with extranodal extension (1 additional lymph node had isolated tumor cells), right mastectomy benign; T3N1a ER 100%, PR 10%, HER-2 -1+, Ki-67 10 to 15%, stage Ib  Pathology counseling: I discussed the final pathology report of the patient provided  a copy of this report. I discussed the margins as well as lymph node surgeries. We also discussed the final staging along with previously performed ER/PR and HER-2/neu testing.  Treatment plan: 1. Mammaprint testing and the final pathology 2.  Adjuvant radiation therapy 3.  Adjuvant antiestrogen therapy with letrozole 2.5 mg daily x5 to 7 years We will evaluate for Natalee clinical trial Return to clinic based upon Mammaprint test result    No orders of the defined types were placed in this encounter.  The patient has a good understanding of the overall plan. she agrees with it. she will call with any problems that may develop before the next visit here.   Harriette Ohara, MD 06/24/18

## 2018-06-25 ENCOUNTER — Telehealth: Payer: Self-pay | Admitting: Hematology and Oncology

## 2018-06-25 ENCOUNTER — Ambulatory Visit: Payer: Self-pay | Admitting: General Surgery

## 2018-06-25 NOTE — Telephone Encounter (Signed)
No 10/22 los orders.  °

## 2018-06-26 ENCOUNTER — Telehealth: Payer: Self-pay | Admitting: *Deleted

## 2018-06-26 NOTE — Telephone Encounter (Signed)
Received mammaprint resutls of low risk. Physician team notified. Called pt with results and discussed chemotherapy is not recommended. Pt still processing ALND recommendation. Will inform team of her final decision.

## 2018-06-27 ENCOUNTER — Encounter: Payer: Self-pay | Admitting: Hematology and Oncology

## 2018-06-30 ENCOUNTER — Encounter (HOSPITAL_COMMUNITY): Payer: Self-pay | Admitting: Hematology and Oncology

## 2018-06-30 DIAGNOSIS — C50112 Malignant neoplasm of central portion of left female breast: Secondary | ICD-10-CM | POA: Diagnosis not present

## 2018-06-30 DIAGNOSIS — Z9011 Acquired absence of right breast and nipple: Secondary | ICD-10-CM | POA: Diagnosis not present

## 2018-06-30 MED FILL — TEMAZEPAM 30 MG CAPSULE: 30 | 30 days supply | Qty: 30 | Fill #1

## 2018-06-30 MED FILL — buPROPion HCL ER (XL) 150 M: 150 | 90 days supply | Qty: 90 | Fill #1

## 2018-07-03 ENCOUNTER — Inpatient Hospital Stay (HOSPITAL_COMMUNITY): Admission: RE | Admit: 2018-07-03 | Payer: 59 | Source: Ambulatory Visit

## 2018-07-03 ENCOUNTER — Encounter (HOSPITAL_COMMUNITY): Payer: Self-pay

## 2018-07-03 NOTE — Patient Instructions (Addendum)
Dawn Booker  Feb 18, 1956      Your procedure is scheduled on:   07-09-2018    Report to [facility] Main  Entrance,  Report to admitting at  6:30 AM     Call this number if you have problems the morning of surgery [phone]     Remember: NO SOLID FOOD AFTER MIDNIGHT THE NIGHT PRIOR TO SURGERY.  NOTHING BY MOUTH EXCEPT CLEAR LIQUIDS UNTIL 3 HOURS PRIOR TO [place] SURGERY. PLEASE FINISH ENSURE    DRINK PER SURGEON ORDER 3 HOURS PRIOR TO SCHEDULED SURGERY TIME WHICH NEEDS TO BE COMPLETED AT ___5:30 AM______.                                          BRUSH YOUR TEETH MORNING OF SURGERY AND RINSE YOUR MOUTH OUT, NO CHEWING GUM CANDY OR MINTS.         Take these medicines the morning of surgery with A SIP OF WATER:   Bupropion (wellbutrin), Rosuvastatin (crestor), Omeprazole (prilosec)                                 You may not have any metal on your body including hair pins and piercings             Do not wear jewelry, make-up, lotions, powders or perfumes, deodorant              Do not wear nail polish.  Do not shave  48 hours prior to surgery.             Do not bring valuables to the hospital. [place].  Contacts, dentures or bridgework may not be worn into surgery.  Leave suitcase in the car. After surgery it may be brought to your room.     Patients discharged the day of surgery will not be allowed to drive home.  Name and phone number of your driver:  Spouse:  [name] [phone]              _____________________________________________________________________      CLEAR LIQUID DIET   Foods Allowed                                                                     Foods Excluded  Coffee and tea, regular and decaf                             liquids that you cannot  Plain Jell-O in any flavor                                             see through such as: Fruit ices  (not with fruit pulp)  milk, soups, orange juice  Iced Popsicles                                    All solid food Carbonated beverages, regular and diet                                    Cranberry, grape and apple juices Sports drinks like Gatorade Lightly seasoned clear broth or consume(fat free) Sugar, honey syrup  Sample Menu Breakfast                                Lunch                                     Supper Cranberry juice                    Beef broth                            Chicken broth Jell-O                                     Grape juice                           Apple juice Coffee or tea                        Jell-O                                      Popsicle                                                Coffee or tea                        Coffee or tea  _____________________________________________________________________           [facility] - Preparing for Surgery Before surgery, you can play an important role.  Because skin is not sterile, your skin needs to be as free of germs as possible.  You can reduce the number of germs on your skin by washing with CHG (chlorahexidine gluconate) soap before surgery.  CHG is an antiseptic cleaner which kills germs and bonds with the skin to continue killing germs even after washing. Please DO NOT use if you have an allergy to CHG or antibacterial soaps.  If your skin becomes reddened/irritated stop using the CHG and inform your nurse when you arrive at Short Stay. Do not shave (including legs and underarms) for at least 48 hours prior to the first CHG shower.  You may shave your face/neck. Please follow these instructions carefully:  1.  Shower with CHG Soap the night before surgery and the  morning of  Surgery.  2.  If you choose to wash your hair, wash your hair first as usual with your  normal  shampoo.  3.  After you shampoo, rinse your hair and body thoroughly to remove the  shampoo.                             4.  Use CHG as you would any other liquid soap.  You can apply chg directly  to the skin and wash                       Gently with a scrungie or clean washcloth.  5.  Apply the CHG Soap to your body ONLY FROM THE NECK DOWN.   Do not use on face/ open                           Wound or open sores. Avoid contact with eyes, ears mouth and genitals (private parts).                       Wash face,  Genitals (private parts) with your normal soap.             6.  Wash thoroughly, paying special attention to the area where your surgery  will be performed.  7.  Thoroughly rinse your body with warm water from the neck down.  8.  DO NOT shower/wash with your normal soap after using and rinsing off  the CHG Soap.             9.  Pat yourself dry with a clean towel.            10.  Wear clean pajamas.            11.  Place clean sheets on your bed the night of your first shower and do not  sleep with pets. Day of Surgery : Do not apply any lotions/deodorants the morning of surgery.  Please wear clean clothes to the hospital/surgery center.  FAILURE TO FOLLOW THESE INSTRUCTIONS MAY RESULT IN THE CANCELLATION OF YOUR SURGERY PATIENT SIGNATURE_________________________________  NURSE SIGNATURE__________________________________  ________________________________________________________________________

## 2018-07-04 ENCOUNTER — Encounter (HOSPITAL_COMMUNITY): Payer: Self-pay

## 2018-07-04 ENCOUNTER — Encounter (HOSPITAL_COMMUNITY)
Admission: RE | Admit: 2018-07-04 | Discharge: 2018-07-04 | Disposition: A | Discharge status: Home / Self Care | Payer: 59 | Source: Ambulatory Visit | Attending: General Surgery | Admitting: General Surgery

## 2018-07-04 DIAGNOSIS — C50912 Malignant neoplasm of unspecified site of left female breast: Secondary | ICD-10-CM | POA: Diagnosis not present

## 2018-07-04 DIAGNOSIS — Z01812 Encounter for preprocedural laboratory examination: Secondary | ICD-10-CM | POA: Insufficient documentation

## 2018-07-04 HISTORY — DX: Estrogen receptor positive status (ER+): Z17.0

## 2018-07-04 HISTORY — DX: Gastro-esophageal reflux disease without esophagitis: K21.9

## 2018-07-04 HISTORY — DX: Malignant neoplasm of overlapping sites of left female breast: C50.812

## 2018-07-04 HISTORY — DX: Nontoxic single thyroid nodule: E04.1

## 2018-07-04 HISTORY — DX: Palpitations: R00.2

## 2018-07-04 HISTORY — DX: Other specified postprocedural states: R11.2

## 2018-07-04 HISTORY — DX: Diaphragmatic hernia without obstruction or gangrene: K44.9

## 2018-07-04 HISTORY — DX: Other specified postprocedural states: Z98.890

## 2018-07-04 LAB — CBC
HCT: 39.9 % (ref 36.0–46.0)
Hemoglobin: 12.6 g/dL (ref 12.0–15.0)
MCH: 31.1 pg (ref 26.0–34.0)
MCHC: 31.6 g/dL (ref 30.0–36.0)
MCV: 98.5 fL (ref 80.0–100.0)
Platelets: 202 10*3/uL (ref 150–400)
RBC: 4.05 MIL/uL (ref 3.87–5.11)
RDW: 12.4 % (ref 11.5–15.5)
WBC: 6.4 10*3/uL (ref 4.0–10.5)
nRBC: 0 % (ref 0.0–0.2)

## 2018-07-04 LAB — BASIC METABOLIC PANEL
Anion gap: 5 (ref 5–15)
BUN: 12 mg/dL (ref 8–23)
CO2: 26 mmol/L (ref 22–32)
Calcium: 8.8 mg/dL — ABNORMAL LOW (ref 8.9–10.3)
Chloride: 109 mmol/L (ref 98–111)
Creatinine, Ser: 0.76 mg/dL (ref 0.44–1.00)
GFR calc Af Amer: >60 mL/min (ref >60)
GFR calc non Af Amer: >60 mL/min (ref >60)
Glucose, Bld: 92 mg/dL (ref 70–99)
Potassium: 3.5 mmol/L (ref 3.5–5.1)
Sodium: 140 mmol/L (ref 135–145)

## 2018-07-04 MED ORDER — CHLORHEXIDINE GLUCONATE CLOTH 2 % EX PADS
6.0000 | MEDICATED_PAD | Freq: Once | CUTANEOUS | Status: DC
  Filled 2018-07-04: qty 6

## 2018-07-04 NOTE — Progress Notes (Signed)
EKG dated 06-06-2018 in epic.

## 2018-07-07 ENCOUNTER — Ambulatory Visit: Payer: 59 | Admitting: Physical Therapy

## 2018-07-09 ENCOUNTER — Ambulatory Visit (HOSPITAL_COMMUNITY): Payer: 59 | Admitting: Anesthesiology

## 2018-07-09 ENCOUNTER — Encounter (HOSPITAL_COMMUNITY)
Admission: RE | Disposition: A | Discharge status: Home / Self Care | Payer: Self-pay | Source: Ambulatory Visit | Attending: General Surgery

## 2018-07-09 ENCOUNTER — Ambulatory Visit (HOSPITAL_COMMUNITY)
Admission: RE | Admit: 2018-07-09 | Discharge: 2018-07-09 | Disposition: A | Discharge status: Home / Self Care | Payer: 59 | Source: Ambulatory Visit | Attending: General Surgery | Admitting: General Surgery

## 2018-07-09 ENCOUNTER — Encounter (HOSPITAL_COMMUNITY): Payer: Self-pay | Admitting: Certified Registered Nurse Anesthetist

## 2018-07-09 DIAGNOSIS — Z87891 Personal history of nicotine dependence: Secondary | ICD-10-CM | POA: Diagnosis not present

## 2018-07-09 DIAGNOSIS — C50812 Malignant neoplasm of overlapping sites of left female breast: Secondary | ICD-10-CM | POA: Diagnosis not present

## 2018-07-09 DIAGNOSIS — Z170 Estrogen receptor positive status [ER+]: Secondary | ICD-10-CM | POA: Diagnosis not present

## 2018-07-09 DIAGNOSIS — K219 Gastro-esophageal reflux disease without esophagitis: Secondary | ICD-10-CM | POA: Insufficient documentation

## 2018-07-09 DIAGNOSIS — Z882 Allergy status to sulfonamides status: Secondary | ICD-10-CM | POA: Diagnosis not present

## 2018-07-09 DIAGNOSIS — Z888 Allergy status to other drugs, medicaments and biological substances status: Secondary | ICD-10-CM | POA: Diagnosis not present

## 2018-07-09 DIAGNOSIS — Z9011 Acquired absence of right breast and nipple: Secondary | ICD-10-CM | POA: Diagnosis not present

## 2018-07-09 DIAGNOSIS — I898 Other specified noninfective disorders of lymphatic vessels and lymph nodes: Secondary | ICD-10-CM | POA: Diagnosis not present

## 2018-07-09 DIAGNOSIS — C50112 Malignant neoplasm of central portion of left female breast: Secondary | ICD-10-CM | POA: Insufficient documentation

## 2018-07-09 HISTORY — PX: AXILLARY LYMPH NODE DISSECTION: SHX5229

## 2018-07-09 SURGERY — LYMPHADENECTOMY, AXILLARY
Anesthesia: General | Site: Axilla | Laterality: Left

## 2018-07-09 MED ORDER — TRAMADOL HCL 50 MG PO TABS: 50.0000 mg | ORAL_TABLET | Freq: Four times a day (QID) | ORAL | 1 refills | Status: DC | PRN

## 2018-07-09 MED ORDER — PHENYLEPHRINE 40 MCG/ML (10ML) SYRINGE FOR IV PUSH (FOR BLOOD PRESSURE SUPPORT)
PREFILLED_SYRINGE | INTRAVENOUS | Status: DC | PRN
  Administered 2018-07-09: 80 ug via INTRAVENOUS

## 2018-07-09 MED ORDER — FENTANYL CITRATE (PF) 100 MCG/2ML IJ SOLN
25.0000 ug | INTRAMUSCULAR | Status: DC | PRN
  Administered 2018-07-09 (×4): 25 ug via INTRAVENOUS

## 2018-07-09 MED ORDER — PROPOFOL 10 MG/ML IV BOLUS
INTRAVENOUS | Status: AC
  Filled 2018-07-09: qty 20

## 2018-07-09 MED ORDER — CEFAZOLIN SODIUM-DEXTROSE 2-4 GM/100ML-% IV SOLN
2.0000 g | INTRAVENOUS | Status: AC
  Administered 2018-07-09: 2 g via INTRAVENOUS
  Filled 2018-07-09: qty 100

## 2018-07-09 MED ORDER — LIDOCAINE 2% (20 MG/ML) 5 ML SYRINGE
INTRAMUSCULAR | Status: AC
  Filled 2018-07-09: qty 5

## 2018-07-09 MED ORDER — GABAPENTIN 300 MG PO CAPS
300.0000 mg | ORAL_CAPSULE | ORAL | Status: AC
  Administered 2018-07-09: 300 mg via ORAL
  Filled 2018-07-09: qty 1

## 2018-07-09 MED ORDER — ONDANSETRON HCL 4 MG/2ML IJ SOLN
INTRAMUSCULAR | Status: AC
  Filled 2018-07-09: qty 2

## 2018-07-09 MED ORDER — FENTANYL CITRATE (PF) 100 MCG/2ML IJ SOLN
INTRAMUSCULAR | Status: AC
  Filled 2018-07-09: qty 2

## 2018-07-09 MED ORDER — EPHEDRINE SULFATE-NACL 50-0.9 MG/10ML-% IV SOSY
PREFILLED_SYRINGE | INTRAVENOUS | Status: DC | PRN
  Administered 2018-07-09 (×2): 10 mg via INTRAVENOUS

## 2018-07-09 MED ORDER — ACETAMINOPHEN 500 MG PO TABS
1000.0000 mg | ORAL_TABLET | ORAL | Status: AC
  Administered 2018-07-09: 1000 mg via ORAL
  Filled 2018-07-09: qty 2

## 2018-07-09 MED ORDER — HYDROMORPHONE HCL 1 MG/ML IJ SOLN
0.2500 mg | INTRAMUSCULAR | Status: DC | PRN
  Administered 2018-07-09 (×2): 0.25 mg via INTRAVENOUS

## 2018-07-09 MED ORDER — LACTATED RINGERS IV SOLN
INTRAVENOUS | Status: DC
  Administered 2018-07-09 (×2): via INTRAVENOUS

## 2018-07-09 MED ORDER — FENTANYL CITRATE (PF) 100 MCG/2ML IJ SOLN
INTRAMUSCULAR | Status: AC
  Administered 2018-07-09: 25 ug via INTRAVENOUS
  Filled 2018-07-09: qty 2

## 2018-07-09 MED ORDER — FENTANYL CITRATE (PF) 250 MCG/5ML IJ SOLN
INTRAMUSCULAR | Status: AC
  Filled 2018-07-09: qty 5

## 2018-07-09 MED ORDER — PROMETHAZINE HCL 25 MG/ML IJ SOLN: 6.2500 mg | INTRAMUSCULAR | Status: DC | PRN

## 2018-07-09 MED ORDER — HYDROCODONE-ACETAMINOPHEN 5-325 MG PO TABS: 1.0000 | ORAL_TABLET | Freq: Four times a day (QID) | ORAL | 0 refills | Status: DC | PRN

## 2018-07-09 MED ORDER — KETOROLAC TROMETHAMINE 30 MG/ML IJ SOLN
INTRAMUSCULAR | Status: AC
  Filled 2018-07-09: qty 1

## 2018-07-09 MED ORDER — FENTANYL CITRATE (PF) 100 MCG/2ML IJ SOLN
INTRAMUSCULAR | Status: DC | PRN
  Administered 2018-07-09 (×3): 50 ug via INTRAVENOUS

## 2018-07-09 MED ORDER — MIDAZOLAM HCL 5 MG/5ML IJ SOLN
INTRAMUSCULAR | Status: DC | PRN
  Administered 2018-07-09: 2 mg via INTRAVENOUS

## 2018-07-09 MED ORDER — KETOROLAC TROMETHAMINE 30 MG/ML IJ SOLN
30.0000 mg | Freq: Once | INTRAMUSCULAR | Status: AC | PRN
  Administered 2018-07-09: 30 mg via INTRAVENOUS

## 2018-07-09 MED ORDER — MIDAZOLAM HCL 2 MG/2ML IJ SOLN
INTRAMUSCULAR | Status: AC
  Filled 2018-07-09: qty 2

## 2018-07-09 MED ORDER — HYDROMORPHONE HCL 1 MG/ML IJ SOLN
INTRAMUSCULAR | Status: AC
  Administered 2018-07-09: 0.25 mg via INTRAVENOUS
  Filled 2018-07-09: qty 1

## 2018-07-09 MED ORDER — DEXAMETHASONE SODIUM PHOSPHATE 10 MG/ML IJ SOLN
INTRAMUSCULAR | Status: AC
  Filled 2018-07-09: qty 1

## 2018-07-09 MED ORDER — OXYCODONE-ACETAMINOPHEN 5-325 MG PO TABS
ORAL_TABLET | ORAL | Status: AC
  Filled 2018-07-09: qty 1

## 2018-07-09 MED ORDER — OXYCODONE HCL 5 MG/5ML PO SOLN
5.0000 mg | Freq: Once | ORAL | Status: AC | PRN
  Filled 2018-07-09: qty 5

## 2018-07-09 MED ORDER — LIDOCAINE 2% (20 MG/ML) 5 ML SYRINGE
INTRAMUSCULAR | Status: DC | PRN
  Administered 2018-07-09: 60 mg via INTRAVENOUS

## 2018-07-09 MED ORDER — BUPIVACAINE-EPINEPHRINE (PF) 0.25% -1:200000 IJ SOLN
INTRAMUSCULAR | Status: AC
  Filled 2018-07-09: qty 30

## 2018-07-09 MED ORDER — DEXAMETHASONE SODIUM PHOSPHATE 10 MG/ML IJ SOLN
INTRAMUSCULAR | Status: DC | PRN
  Administered 2018-07-09: 10 mg via INTRAVENOUS

## 2018-07-09 MED ORDER — 0.9 % SODIUM CHLORIDE (POUR BTL) OPTIME
TOPICAL | Status: DC | PRN
  Administered 2018-07-09: 1000 mL

## 2018-07-09 MED ORDER — OXYCODONE HCL 5 MG PO TABS
ORAL_TABLET | ORAL | Status: AC
  Filled 2018-07-09: qty 1

## 2018-07-09 MED ORDER — OXYCODONE HCL 5 MG PO TABS
5.0000 mg | ORAL_TABLET | Freq: Once | ORAL | Status: AC | PRN
  Administered 2018-07-09: 5 mg via ORAL

## 2018-07-09 MED ORDER — ONDANSETRON HCL 4 MG/2ML IJ SOLN
INTRAMUSCULAR | Status: DC | PRN
  Administered 2018-07-09: 4 mg via INTRAVENOUS

## 2018-07-09 MED ORDER — PROPOFOL 10 MG/ML IV BOLUS
INTRAVENOUS | Status: DC | PRN
  Administered 2018-07-09: 150 mg via INTRAVENOUS

## 2018-07-09 MED FILL — traMADol HCL 50 MG TABS: 50 | 7 days supply | Qty: 30 | Fill #0

## 2018-07-09 MED FILL — HYDROCODON-APAP 5-325: 5-325 | 2 days supply | Qty: 15 | Fill #0

## 2018-07-09 SURGICAL SUPPLY — 48 items
ADH SKN CLS APL DERMABOND .7 (GAUZE/BANDAGES/DRESSINGS) ×1
APPLIER CLIP 9.375 MED OPEN (MISCELLANEOUS) ×4
APR CLP MED 9.3 20 MLT OPN (MISCELLANEOUS) ×2
BANDAGE ACE 6X5 VEL STRL LF (GAUZE/BANDAGES/DRESSINGS) ×1 IMPLANT
BINDER BREAST XLRG (GAUZE/BANDAGES/DRESSINGS) ×1 IMPLANT
BLADE SURG 15 STRL LF DISP TIS (BLADE) ×3 IMPLANT
BLADE SURG 15 STRL SS (BLADE) ×2
CLEANER TIP ELECTROSURG 2X2 (MISCELLANEOUS) ×1 IMPLANT
CLIP APPLIE 9.375 MED OPEN (MISCELLANEOUS) IMPLANT
COVER WAND RF STERILE (DRAPES) ×1 IMPLANT
DECANTER SPIKE VIAL GLASS SM (MISCELLANEOUS) ×1 IMPLANT
DERMABOND ADVANCED (GAUZE/BANDAGES/DRESSINGS) ×1
DERMABOND ADVANCED .7 DNX12 (GAUZE/BANDAGES/DRESSINGS) IMPLANT
DRAIN CHANNEL 19F RND (DRAIN) ×1 IMPLANT
DRAPE LAPAROTOMY TRNSV 102X78 (DRAPE) ×1 IMPLANT
ELECT COATED BLADE 2.86 ST (ELECTRODE) ×1 IMPLANT
ELECT PENCIL ROCKER SW 15FT (MISCELLANEOUS) ×2 IMPLANT
ELECT REM PT RETURN 15FT ADLT (MISCELLANEOUS) ×2 IMPLANT
EVACUATOR SILICONE 100CC (DRAIN) ×1 IMPLANT
GAUZE 4X4 16PLY RFD (DISPOSABLE) ×1 IMPLANT
GAUZE SPONGE 4X4 12PLY STRL (GAUZE/BANDAGES/DRESSINGS) ×2 IMPLANT
GLOVE BIO SURGEON STRL SZ7.5 (GLOVE) ×4 IMPLANT
GLOVE BIOGEL PI IND STRL 7.0 (GLOVE) ×1 IMPLANT
GLOVE BIOGEL PI INDICATOR 7.0 (GLOVE) ×1
GOWN STRL REUS W/ TWL XL LVL3 (GOWN DISPOSABLE) ×1 IMPLANT
GOWN STRL REUS W/TWL LRG LVL3 (GOWN DISPOSABLE) ×3 IMPLANT
GOWN STRL REUS W/TWL XL LVL3 (GOWN DISPOSABLE) ×1 IMPLANT
KIT BASIN OR (CUSTOM PROCEDURE TRAY) ×2 IMPLANT
MARKER SKIN DUAL TIP RULER LAB (MISCELLANEOUS) ×2 IMPLANT
NDL HYPO 25X1 1.5 SAFETY (NEEDLE) ×1 IMPLANT
NEEDLE HYPO 22GX1.5 SAFETY (NEEDLE) ×1 IMPLANT
NEEDLE HYPO 25X1 1.5 SAFETY (NEEDLE) IMPLANT
PACK BASIC VI WITH GOWN DISP (CUSTOM PROCEDURE TRAY) ×2 IMPLANT
SOL PREP POV-IOD 4OZ 10% (MISCELLANEOUS) ×1 IMPLANT
SPONGE LAP 18X18 RF (DISPOSABLE) ×1 IMPLANT
SPONGE LAP 4X18 RFD (DISPOSABLE) ×2 IMPLANT
STRIP CLOSURE SKIN 1/2X4 (GAUZE/BANDAGES/DRESSINGS) ×2 IMPLANT
SUT ETHILON 3 0 PS 1 (SUTURE) ×1 IMPLANT
SUT MNCRL AB 4-0 PS2 18 (SUTURE) ×1 IMPLANT
SUT VIC AB 3-0 SH 18 (SUTURE) ×1 IMPLANT
SUT VIC AB 3-0 SH 27 (SUTURE) ×2
SUT VIC AB 3-0 SH 27XBRD (SUTURE) IMPLANT
SYR BULB IRRIGATION 50ML (SYRINGE) ×2 IMPLANT
SYR CONTROL 10ML LL (SYRINGE) ×1 IMPLANT
TAPE CLOTH SURG 6X10 WHT LF (GAUZE/BANDAGES/DRESSINGS) ×1 IMPLANT
TOWEL OR 17X26 10 PK STRL BLUE (TOWEL DISPOSABLE) ×2 IMPLANT
TOWEL OR NON WOVEN STRL DISP B (DISPOSABLE) ×1 IMPLANT
YANKAUER SUCT BULB TIP 10FT TU (MISCELLANEOUS) ×2 IMPLANT

## 2018-07-09 NOTE — Anesthesia Procedure Notes (Signed)
Procedure Name: LMA Insertion Date/Time: 07/09/2018 8:33 AM Performed by: Mitzie Na, CRNA Pre-anesthesia Checklist: Patient identified, Emergency Drugs available, Patient being monitored, Timeout performed and Suction available Patient Re-evaluated:Patient Re-evaluated prior to induction Oxygen Delivery Method: Circle system utilized Preoxygenation: Pre-oxygenation with 100% oxygen Induction Type: IV induction LMA: LMA inserted LMA Size: 3.0 Number of attempts: 1 Placement Confirmation: positive ETCO2 and breath sounds checked- equal and bilateral Tube secured with: Tape Dental Injury: Teeth and Oropharynx as per pre-operative assessment

## 2018-07-09 NOTE — Interval H&P Note (Signed)
History and Physical Interval Note:  07/09/2018 8:13 AM  Dawn Booker  has presented today for surgery, with the diagnosis of LEFT BREAST CANCER  The various methods of treatment have been discussed with the patient and family. After consideration of risks, benefits and other options for treatment, the patient has consented to  Procedure(s): COMPLETION OF LEFT AXILLARY LYMPH NODE DISSECTION (Left) as a surgical intervention .  The patient's history has been reviewed, patient examined, no change in status, stable for surgery.  I have reviewed the patient's chart and labs.  Questions were answered to the patient's satisfaction.     [name]

## 2018-07-09 NOTE — Op Note (Signed)
07/09/2018  9:47 AM  PATIENT:  Dawn Booker  62 y.o. female  PRE-OPERATIVE DIAGNOSIS:  LEFT BREAST CANCER  POST-OPERATIVE DIAGNOSIS:  LEFT BREAST CANCER  PROCEDURE:  Procedure(s): COMPLETION OF DEEP LEFT AXILLARY LYMPH NODE DISSECTION (Left)  SURGEON:  Surgeon(s) and Role:    * Jovita Kussmaul, MD - Primary  PHYSICIAN ASSISTANT:   ASSISTANTS: none   ANESTHESIA:   general  EBL:  minimal   BLOOD ADMINISTERED:none  DRAINS: (1) Jackson-Pratt drain(s) with closed bulb suction in the left axilla   LOCAL MEDICATIONS USED:  NONE  SPECIMEN:  Source of Specimen:  left axillary contents  DISPOSITION OF SPECIMEN:  PATHOLOGY  COUNTS:  YES  TOURNIQUET:  * No tourniquets in log *  DICTATION: .Dragon Dictation   After informed consent was obtained the patient was brought to the operating room and placed in the supine position on the operating table.  After adequate induction of general anesthesia the patient's left chest and axillary area were prepped with ChloraPrep, allowed to dry, and draped in usual sterile manner.  The previous mastectomy incision was reopened laterally with a 15 blade knife.  The incision was extended laterally a short distance.  The incision was carried through the skin and subcutaneous tissue sharply with electrocautery until the chest wall was encountered.  The dissection was carried along the chest wall until both the serratus and the latissimus muscles were identified.  The dissection was then carried superiorly into the axilla.  I was able to identify the position of the left axillary vein.  The axillary contents within these boundaries was then gently dissected free by blunt right angle dissection.  Several small intercostal brachial nerves and vessels were controlled with clips.  The long thoracic and thoracodorsal nerves were identified and spared.  Once the dissection was complete the entire left axillary contents en bloc were removed and sent to pathology  for further evaluation.  Hemostasis was achieved using the Bovie electrocautery.  A small stab incision was made near the anterior axillary line inferior to the operative area with a 15 blade knife.  A tonsil clamp was placed through this opening and used to bring a 19 [place] round Blake drain into the operative bed.  The drain was placed in the left axilla.  The drain was anchored to the skin with a 3-0 nylon stitch.  The incision was then closed with a deep layer of interrupted 3-0 Vicryl stitches.  The skin was closed with a running 4-0 Monocryl subcuticular stitch.  Dermabond dressings were applied.  The patient tolerated the procedure well.  The drain was placed to bulb suction and there was a good seal.  At the end of the case all needle sponge and instrument counts were correct.  The patient was then awakened and taken to recovery in stable condition.  PLAN OF CARE: Discharge to home after PACU  PATIENT DISPOSITION:  PACU - hemodynamically stable.   Delay start of Pharmacological VTE agent (>24hrs) due to surgical blood loss or risk of bleeding: not applicable

## 2018-07-09 NOTE — H&P (Signed)
Skip Estimable  Location: [facility] Patient #: [id number] DOB: August 21, 1956 Married / Language: English / Race: White Female   History of Present Illness The patient is a 62 year old female who presents for a follow-up for Breast cancer. The patient is a 62 year old white female who is about 3 weeks status post a left mastectomy and sentinel node mapping with targeted node dissection for a T3 N1 A left breast cancer that was ER and PR positive and HER-2 negative with a Ki-67 of 10%. She also had a right prophylactic mastectomy. She tolerated the surgery well. Her main complaint is of some sensitivity along the triceps area of the left arm.   Allergies  Brintellix *ANTIDEPRESSANTS*  Increased anxiety LaMICtal *ANTICONVULSANTS*  STEVEN JOHNSON SYNDROME Morphine Sulfate *ANALGESICS - OPIOID*  INCREASE IN HEART RATE PredniSONE (Pak) *CORTICOSTEROIDS*  Rash. Sulfamethoxazole *SULFONAMIDES*  Zoloft *ANTIDEPRESSANTS*  INCREASED ANXIETY Allergies Reconciled   Medication History  Tylenol (500MG Capsule, Oral) Active. Wellbutrin XL (300MG Tablet ER 24HR, Oral) Active. Vitamin D (1000UNIT Tablet, Oral) Active. Benadryl (25MG Capsule, Oral) Active. Gabapentin (300MG Capsule, Oral) Active. Ibuprofen (600MG Tablet, Oral) Active. L-Methylfolate (15MG Tablet, Oral) Active. Lidoderm (5% Patch, External) Active. PriLOSEC (40MG Capsule DR, Oral) Active. Crestor (10MG Tablet, Oral) Active. Restoril (15MG Capsule, Oral) Active. Valtrex (1GM Tablet, Oral) Active. Illusions C Breast Prosthesis (1 (one)) Active. (H6314 Silicone Breast Prosthesis - to restore balance and symmetry after breast surgery; QTY: 4 for Bilateral; Length of use: 2 years No refills ; L8000 Post-surgical bras - to hold breast prosthesis ; QTY: 6; Length of use:3 Months 3 Refills ; H7026 Post-mastectomy camisole - to hold drain tubes and wear during healing.; QTY: 2; Length of use: 3 Months 3  Refills) Medications Reconciled    Review of Systems  General Present- Fatigue and Night Sweats. Not Present- Appetite Loss, Chills, Fever, Weight Gain and Weight Loss. Skin Not Present- Change in Wart/Mole, Dryness, Hives, Jaundice, New Lesions, Non-Healing Wounds, Rash and Ulcer. HEENT Present- Hearing Loss, Hoarseness and Sinus Pain. Not Present- Earache, Nose Bleed, Oral Ulcers, Ringing in the Ears, Seasonal Allergies, Sore Throat, Visual Disturbances, Wears glasses/contact lenses and Yellow Eyes. Respiratory Present- Chronic Cough and Snoring. Not Present- Bloody sputum, Difficulty Breathing and Wheezing. Breast Not Present- Breast Mass, Breast Pain, Nipple Discharge and Skin Changes. Cardiovascular Present- Leg Cramps, Palpitations and Swelling of Extremities. Not Present- Chest Pain, Difficulty Breathing Lying Down, Rapid Heart Rate and Shortness of Breath. Gastrointestinal Present- Abdominal Pain, Bloating, Difficulty Swallowing, Hemorrhoids and Nausea. Not Present- Bloody Stool, Change in Bowel Habits, Chronic diarrhea, Constipation, Excessive gas, Gets full quickly at meals, Indigestion, Rectal Pain and Vomiting. Female Genitourinary Not Present- Frequency, Nocturia, Painful Urination, Pelvic Pain and Urgency. Musculoskeletal Present- Back Pain, Joint Pain and Muscle Weakness. Not Present- Joint Stiffness, Muscle Pain and Swelling of Extremities. Neurological Present- Decreased Memory and Headaches. Not Present- Fainting, Numbness, Seizures, Tingling, Tremor, Trouble walking and Weakness. Psychiatric Present- Anxiety and Depression. Not Present- Bipolar, Change in Sleep Pattern, Fearful and Frequent crying. Endocrine Present- Hot flashes. Not Present- Cold Intolerance, Excessive Hunger, Hair Changes, Heat Intolerance and New Diabetes. Hematology Not Present- Blood Thinners, Easy Bruising, Excessive bleeding, Gland problems, HIV and Persistent Infections.  Vitals  Weight: 149.8 lb  Height: 66in Body Surface Area: 1.77 m Body Mass Index: 24.18 kg/m  Temp.: 98.80F  Pulse: 87 (Regular)        Physical Exam  General Mental Status-Alert. General Appearance-Consistent with stated age. Hydration-Well hydrated. Voice-Normal.  Head  and Neck Head-normocephalic, atraumatic with no lesions or palpable masses. Trachea-midline. Thyroid Gland Characteristics - normal size and consistency.  Eye Eyeball - Bilateral-Extraocular movements intact. Sclera/Conjunctiva - Bilateral-No scleral icterus.  Chest and Lung Exam Chest and lung exam reveals -quiet, even and easy respiratory effort with no use of accessory muscles and on auscultation, normal breath sounds, no adventitious sounds and normal vocal resonance. Inspection Chest Wall - Normal. Back - normal.  Breast Note: Both mastectomy incisions are healing nicely with no sign of infection. She has a minimal seroma on the right side which is not large enough to tap. Her skin flaps are healthy.   Cardiovascular Cardiovascular examination reveals -normal heart sounds, regular rate and rhythm with no murmurs and normal pedal pulses bilaterally.  Abdomen Inspection Inspection of the abdomen reveals - No Hernias. Skin - Scar - no surgical scars. Palpation/Percussion Palpation and Percussion of the abdomen reveal - Soft, Non Tender, No Rebound tenderness, No Rigidity (guarding) and No hepatosplenomegaly. Auscultation Auscultation of the abdomen reveals - Bowel sounds normal.  Neurologic Neurologic evaluation reveals -alert and oriented x 3 with no impairment of recent or remote memory. Mental Status-Normal.  Musculoskeletal Normal Exam - Left-Upper Extremity Strength Normal and Lower Extremity Strength Normal. Normal Exam - Right-Upper Extremity Strength Normal and Lower Extremity Strength Normal.  Lymphatic Head & Neck  General Head & Neck Lymphatics: Bilateral -  Description - Normal. Axillary  General Axillary Region: Bilateral - Description - Normal. Tenderness - Non Tender. Femoral & Inguinal  Generalized Femoral & Inguinal Lymphatics: Bilateral - Description - Normal. Tenderness - Non Tender.    Assessment & Plan  MALIGNANT NEOPLASM OF CENTRAL PORTION OF LEFT BREAST IN FEMALE, ESTROGEN RECEPTOR POSITIVE (C50.112) Impression: The patient is about 3 weeks status post left mastectomy for breast cancer and right prophylactic mastectomy. She tolerated the surgery well. She is still having some significant sensitivity along the chest wall. She is scheduled for a left sided completion axillary lymph node dissection in about 5 days because she had a couple positive nodes. We have gone over the surgery again including the risks and benefits and she understands and wishes to proceed. I will see her back in about 3 weeks as she is recovering from her node dissection. Current Plans Follow up with [place] in the office in 3 WEEKS.  Call us sooner as needed.

## 2018-07-09 NOTE — Anesthesia Preprocedure Evaluation (Signed)
Anesthesia Evaluation  Patient identified by MRN, date of birth, ID band Patient awake    Reviewed: Allergy & Precautions, NPO status , Patient's Chart, lab work & pertinent test results  History of Anesthesia Complications (+) PONV  Airway Mallampati: II  TM Distance: >3 FB Neck ROM: Full    Dental no notable dental hx.    Pulmonary neg pulmonary ROS, former smoker,    Pulmonary exam normal breath sounds clear to auscultation       Cardiovascular negative cardio ROS Normal cardiovascular exam Rhythm:Regular Rate:Normal     Neuro/Psych negative neurological ROS  negative psych ROS   GI/Hepatic Neg liver ROS, GERD  ,  Endo/Other  negative endocrine ROS  Renal/GU negative Renal ROS  negative genitourinary   Musculoskeletal negative musculoskeletal ROS (+)   Abdominal   Peds negative pediatric ROS (+)  Hematology negative hematology ROS (+)   Anesthesia Other Findings   Reproductive/Obstetrics negative OB ROS                             Anesthesia Physical Anesthesia Plan  ASA: II  Anesthesia Plan: General   Post-op Pain Management:    Induction: Intravenous  PONV Risk Score and Plan: 4 or greater and Scopolamine patch - Pre-op, Ondansetron, Dexamethasone and Treatment may vary due to age or medical condition  Airway Management Planned: LMA  Additional Equipment:   Intra-op Plan:   Post-operative Plan: Extubation in OR  Informed Consent: I have reviewed the patients History and Physical, chart, labs and discussed the procedure including the risks, benefits and alternatives for the proposed anesthesia with the patient or authorized representative who has indicated his/her understanding and acceptance.   Dental advisory given  Plan Discussed with: CRNA and Surgeon  Anesthesia Plan Comments:         Anesthesia Quick Evaluation

## 2018-07-09 NOTE — Discharge Instructions (Signed)
General Anesthesia, Adult, Care After These instructions provide you with information about caring for yourself after your procedure. Your health care provider may also give you more specific instructions. Your treatment has been planned according to current medical practices, but problems sometimes occur. Call your health care provider if you have any problems or questions after your procedure. What can I expect after the procedure? After the procedure, it is common to have:  Vomiting.  A sore throat.  Mental slowness.  It is common to feel:  Nauseous.  Cold or shivery.  Sleepy.  Tired.  Sore or achy, even in parts of your body where you did not have surgery.  Follow these instructions at home: For at least 24 hours after the procedure:  Do not: ? Participate in activities where you could fall or become injured. ? Drive. ? Use heavy machinery. ? Drink alcohol. ? Take sleeping pills or medicines that cause drowsiness. ? Make important decisions or sign legal documents. ? Take care of children on your own.  Rest. Eating and drinking  If you vomit, drink water, juice, or soup when you can drink without vomiting.  Drink enough fluid to keep your urine clear or pale yellow.  Make sure you have little or no nausea before eating solid foods.  Follow the diet recommended by your health care provider. General instructions  Have a responsible adult stay with you until you are awake and alert.  Return to your normal activities as told by your health care provider. Ask your health care provider what activities are safe for you.  Take over-the-counter and prescription medicines only as told by your health care provider.  If you smoke, do not smoke without supervision.  Keep all follow-up visits as told by your health care provider. This is important. Contact a health care provider if:  You continue to have nausea or vomiting at home, and medicines are not helpful.  You  cannot drink fluids or start eating again.  You cannot urinate after 8-12 hours.  You develop a skin rash.  You have fever.  You have increasing redness at the site of your procedure. Get help right away if:  You have difficulty breathing.  You have chest pain.  You have unexpected bleeding.  You feel that you are having a life-threatening or urgent problem. This information is not intended to replace advice given to you by your health care provider. Make sure you discuss any questions you have with your health care provider. Document Released: 11/26/2000 Document Revised: 01/23/2016 Document Reviewed: 08/04/2015 Elsevier Interactive Patient Education  2018 [place].   Open Lymph Node Biopsy, Care After   Refer to this sheet in the next few weeks. These instructions provide you with information about caring for yourself after your procedure. Your health care provider may also give you more specific instructions. Your treatment has been planned according to current medical practices, but problems sometimes occur. Call your health care provider if you have any problems or questions after your procedure. What can I expect after the procedure? After the procedure, it is common to have:  Bruising.  Soreness.  Mild swelling.  Follow these instructions at home: Medicines  Take over-the-counter and prescription medicines only as told by your health care provider.  If you were prescribed an antibiotic medicine, take it as told by your health care provider. Do not stop taking the antibiotic even if you start to feel better. Incision care   Follow instructions from your health  care provider about how to take care of your incision. Make sure you: ? Wash your hands with soap and water before you change your bandage (dressing). If soap and water are not available, use hand sanitizer. ? Change your dressing as told by your health care provider. ? Leave stitches (sutures), skin  glue, or adhesive strips in place. These skin closures may need to stay in place for 2 weeks or longer. If adhesive strip edges start to loosen and curl up, you may trim the loose edges. Do not remove adhesive strips completely unless your health care provider tells you to do that.  Check your incision area every day for signs of infection. Check for: ? More redness, swelling, or pain. ? More fluid or blood. ? Warmth. ? Pus or a bad smell. Driving  Do not drive for 24 hours if you received a sedative.  Do not drive or operate heavy machinery while taking prescription pain medicine. General instructions   It is your responsibility to get the results of your procedure. Ask your health care provider or the department performing the procedure when your results will be ready.  Return to your normal activities as told by your health care provider. Ask your health care provider what activities are safe for you.  Do not take baths, swim, or use a hot tub until your health care provider approves.  Wear compression stockings as told by your health care provider. These stockings help to prevent blood clots and reduce swelling in your legs.  Keep all follow-up visits as told by your health care provider. This is important. Contact a health care provider if:  You have more redness, swelling, or pain around your incision.  You have more fluid or blood coming from your incision.  Your incision feels warm to the touch.  You have pus or a bad smell coming from your incision.  You have a fever.  You have pain or numbness that gets worse or lasts longer than a few days. This information is not intended to replace advice given to you by your health care provider. Make sure you discuss any questions you have with your health care provider. Document Released: 09/16/2015 Document Revised: 01/26/2016 Document Reviewed: 12/15/2014 Elsevier Interactive Patient Education  2018 [place] this sheet to all of your post-operative appointments while you have your drains.  Please measure your drains by CC's or ML's.  Make sure you drain and measure your JP Drains 3 times per day.  At the end of each day, add up totals for the left side and add up totals for the right side.    ( 9 am )     ( 3 pm )        ( 9 pm )                Date L  R  L  R  L  R  Total L/R

## 2018-07-09 NOTE — Progress Notes (Signed)
Pharmacy will not refill gabapentin prescription as insurance thinks patient has 18 days left in house. Patient had been taking more frequently after masectomy surgery. Dr Marlou Starks made aware. He provides patient with prescription for Ultram and Hydrocodone to go home with.

## 2018-07-09 NOTE — Transfer of Care (Signed)
Immediate Anesthesia Transfer of Care Note  Patient: Dawn Booker  Procedure(s) Performed: COMPLETION OF LEFT AXILLARY LYMPH NODE DISSECTION (Left Axilla)  Patient Location: PACU  Anesthesia Type:General  Level of Consciousness: awake, alert , oriented and patient cooperative  Airway & Oxygen Therapy: Patient Spontanous Breathing and Patient connected to face mask oxygen  Post-op Assessment: Report given to RN, Post -op Vital signs reviewed and stable and Patient moving all extremities  Post vital signs: Reviewed and stable  Last Vitals:  Vitals Value Taken Time  BP 139/90 07/09/2018 10:00 AM  Temp    Pulse 90 07/09/2018 10:04 AM  Resp 15 07/09/2018 10:04 AM  SpO2 100 % 07/09/2018 10:04 AM  Vitals shown include unvalidated device data.  Last Pain:  Vitals:   07/09/18 0723  TempSrc:   PainSc: 0-No pain         Complications: No apparent anesthesia complications

## 2018-07-09 NOTE — Anesthesia Postprocedure Evaluation (Signed)
Anesthesia Post Note  Patient: Dawn Booker  Procedure(s) Performed: COMPLETION OF LEFT AXILLARY LYMPH NODE DISSECTION (Left Axilla)     Patient location during evaluation: PACU Anesthesia Type: General Level of consciousness: awake and alert Pain management: pain level controlled Vital Signs Assessment: post-procedure vital signs reviewed and stable Respiratory status: spontaneous breathing, nonlabored ventilation, respiratory function stable and patient connected to nasal cannula oxygen Cardiovascular status: blood pressure returned to baseline and stable Postop Assessment: no apparent nausea or vomiting Anesthetic complications: no    Last Vitals:  Vitals:   07/09/18 1145 07/09/18 1205  BP:  (!) 158/98  Pulse:  81  Resp: 12 14  Temp:  36.5 C  SpO2:  100%    Last Pain:  Vitals:   07/09/18 1205  TempSrc: Oral  PainSc: 4                  Enza Shone S

## 2018-07-10 ENCOUNTER — Encounter (HOSPITAL_COMMUNITY): Payer: Self-pay | Admitting: General Surgery

## 2018-07-10 ENCOUNTER — Encounter: Payer: 59 | Admitting: Physical Therapy

## 2018-07-11 ENCOUNTER — Encounter: Payer: Self-pay | Admitting: *Deleted

## 2018-07-11 DIAGNOSIS — C50812 Malignant neoplasm of overlapping sites of left female breast: Secondary | ICD-10-CM

## 2018-07-11 DIAGNOSIS — Z170 Estrogen receptor positive status [ER+]: Secondary | ICD-10-CM

## 2018-07-22 ENCOUNTER — Ambulatory Visit: Payer: 59 | Attending: General Surgery | Admitting: Physical Therapy

## 2018-07-22 ENCOUNTER — Encounter: Payer: Self-pay | Admitting: Physical Therapy

## 2018-07-22 DIAGNOSIS — Z483 Aftercare following surgery for neoplasm: Secondary | ICD-10-CM | POA: Insufficient documentation

## 2018-07-22 DIAGNOSIS — M25612 Stiffness of left shoulder, not elsewhere classified: Secondary | ICD-10-CM | POA: Insufficient documentation

## 2018-07-22 DIAGNOSIS — M6281 Muscle weakness (generalized): Secondary | ICD-10-CM | POA: Diagnosis not present

## 2018-07-22 DIAGNOSIS — R293 Abnormal posture: Secondary | ICD-10-CM | POA: Insufficient documentation

## 2018-07-22 NOTE — Therapy (Signed)
[place], [place], [address] Phone: [phone]   Fax:  [phone]  Physical Therapy Re- Evaluation  Patient Details  Name: Dawn Booker MRN: [id number] Date of Birth: 1956/03/31 Referring Provider (PT): Dr. Autumn Messing   Encounter Date: 07/22/2018  PT End of Session - 07/22/18 1742    Visit Number  2    Number of Visits  14    Date for PT Re-Evaluation  09/02/18    PT Start Time  2575    PT Stop Time  1430    PT Time Calculation (min)  45 min    Activity Tolerance  Patient limited by pain    Behavior During Therapy  [facility] for tasks assessed/performed       Past Medical History:  Diagnosis Date  . ADD (attention deficit disorder)   . Anxiety   . Cancer [facility])    breast cancer  . Depression   . GERD (gastroesophageal reflux disease)   . Hiatal hernia   . Hyperlipemia, mixed   . Intermittent palpitations   . Malignant neoplasm of overlapping sites of left breast in female, estrogen receptor positive ([place]) 05/06/2018   oncologist-  dr Lindi Adie--  Invasive Lobular Cancer, CIS, Stage IB, Grade 2 (pT3,pN1a,cM0), ER+---  s/p  left mastectomy with sln dissections and right mastectomy (benign)  . PONV (postoperative nausea and vomiting)   . Right thyroid nodule   . Shingles 12/2017    Past Surgical History:  Procedure Laterality Date  . AXILLARY LYMPH NODE DISSECTION Left 07/09/2018   Procedure: COMPLETION OF LEFT AXILLARY LYMPH NODE DISSECTION;  Surgeon: Jovita Kussmaul, MD;  Location: [facility];  Service: General;  Laterality: Left;  . BREAST EXCISIONAL BIOPSY Left 1995   benign  . CARDIOVASCULAR STRESS TEST  06/21/2017   normal nuclear stress study w/ no ischemia/  normal LV function and wall function, nuclear stress ef 70%  . ELBOW SURGERY Left child  . MASTECTOMY W/ SENTINEL NODE BIOPSY Left 06/13/2018  . MASTECTOMY WITH RADIOACTIVE SEED GUIDED EXCISION AND AXILLARY SENTINEL LYMPH NODE BIOPSY  Bilateral 06/13/2018   Procedure: LEFT MASTECTOMY WITH SENTINEL LYMPH NODE MAPPING AND TARGETED NODE DISSECTION AND RIGHT PROPHYLATIC MASTECTOMY;  Surgeon: Jovita Kussmaul, MD;  Location: [place];  Service: General;  Laterality: Bilateral;  . THYROIDECTOMY Left 10-31-2001   dr gerkin '@[facility]'   . TRANSTHORACIC ECHOCARDIOGRAM  06/21/2017   ef 60-65%/  trivial MR (no evidence mvp)  . TUBAL LIGATION  yrs ago  . VAGINAL HYSTERECTOMY  1990s    There were no vitals filed for this visit.   Subjective Assessment - 07/22/18 1354    Subjective  pt reports that she is having pain down the left arm.     Pertinent History  Patient was diagnosed on 04/25/18 with left grade II invasive lobular carcinoma breast cancer. There are 2 areas: 2.6 cm in the upper outer quadrant and 6 mm in the upper inner quadrant. Both are ER/PR positive and HER2 negative with a Ki67 of 10% and she has a positive axillary node. Bilateral mastectomy on 06/13/2018 wiht 7 nodes removed and the 10 in second surgery on 07/09/2018  She will have radiation  and goes for her simulation on 08/19/2018    Patient Stated Goals  to get the pain and get stronger and range of motion and find out what my new norm is going     Currently in Pain?  Yes    Pain Score  7    makes her nauseated    Pain Location  Axilla    Pain Orientation  Left    Pain Descriptors / Indicators  Aching    Pain Type  Surgical pain    Pain Onset  Today    Pain Frequency  Constant    Aggravating Factors   can't say     Pain Relieving Factors  can't say          [facility] PT Assessment - 07/22/18 0001      Assessment   Medical Diagnosis  Left breast cancer    Referring Provider (PT)  Dr. Autumn Messing    Onset Date/Surgical Date  04/25/18      Prior Function   Level of Independence  Independent      Observation/Other Assessments   Observations  Pt with healing incisions across both sides of chest with fullness above incisions. Skin dimpling in left upper arm associated  with pain likely from cording.       AROM   Right Shoulder Flexion  160 Degrees    Right Shoulder ABduction  132 Degrees    Right Shoulder External Rotation  90 Degrees    Left Shoulder Flexion  108 Degrees    Left Shoulder ABduction  80 Degrees    Left Shoulder External Rotation  85 Degrees      Strength   Overall Strength  Deficits   limted in left arm by pain, little left scapular movement        LYMPHEDEMA/ONCOLOGY QUESTIONNAIRE - 07/22/18 1409      Right Upper Extremity Lymphedema   10 cm Proximal to Olecranon Process  27.5 cm    Olecranon Process  23.5 cm    10 cm Proximal to Ulnar Styloid Process  20.5 cm    Just Proximal to Ulnar Styloid Process  14.5 cm    Across Hand at [employer]  18.1 cm    At [place] of 2nd Digit  5.5 cm      Left Upper Extremity Lymphedema   10 cm Proximal to Olecranon Process  25.9 cm    Olecranon Process  22.2 cm    10 cm Proximal to Ulnar Styloid Process  19 cm    Just Proximal to Ulnar Styloid Process  14.1 cm    Across Hand at [employer]  17.2 cm    At [place] of 2nd Digit  5.5 cm             Objective measurements completed on examination: See above findings.      [place] Adult PT Treatment/Exercise - 07/22/18 0001      Self-Care   Self-Care  Other Self-Care Comments    Other Self-Care Comments   provided medium tg soft to left arm to wear for comfort       Exercises   Exercises  Shoulder      Shoulder Exercises: Supine   Protraction  AROM;Right;Left;5 reps    Protraction Limitations  left arm with elbow bent     Flexion  AROM;Right;5 reps             PT Education - 07/22/18 1742    Education Details  wear tg soft for comfort only, remove at any sign of discomfort     Person(s) Educated  Patient    Methods  Explanation    Comprehension  Verbalized understanding       PT Short Term Goals - 07/22/18 1750  PT SHORT TERM GOAL #1   Title  Pt will verbalize lymphedema risk reduction practices      Time  4    Period  Weeks    Status  New      PT SHORT TERM GOAL #2   Title  Pt will have 125 degrees if painless left shoulder abduction so that she can receive radiation     Time  4    Period  Weeks    Status  New      PT SHORT TERM GOAL #3   Title  Pt will be independent in a home exercise program for shoulder range of motion     Time  4    Period  Weeks        PT Long Term Goals - 07/22/18 1751      PT LONG TERM GOAL #1   Title  Patient will demonstrate she has regained shoulder ROM and function post operatively compared to baseline measurements.    Time  6    Period  Weeks    Status  New      PT LONG TERM GOAL #2   Title  Pt will be independent in a home exercise program for strengthening     Time  6    Period  Weeks    Status  New             Plan - 07/22/18 1743    Clinical Impression Statement  Pt returns to PT after bilateral mastectomy and an other surgery for lymph node removeal for a total of 10 removed.  She plans to have radiation.  She is limited by pain and cording with decreased ROM and strength on the left side. She will need to have lymphedema risk reduction education and wants to learn an exercise program to incease her strength     History and Personal Factors relevant to plan of care:  2 surgeries     Clinical Presentation  Evolving    Clinical Presentation due to:  will have radiation    Rehab Potential  Good    Clinical Impairments Affecting Rehab Potential  17 nodes removed with second surgery needed for lymph node dissection     PT Frequency  2x / week    PT Duration  6 weeks    PT Treatment/Interventions  ADLs/Self Care Home Management;DME Instruction;Therapeutic activities;Therapeutic exercise;Orthotic Fit/Training;Patient/family education;Manual techniques;Neuromuscular re-education;Manual lymph drainage;Compression bandaging;Scar mobilization;Taping;Passive range of motion    PT Next Visit Plan  reassess cording in left arm and effect of  tg soft, MLD and stretching to left arm and cording. teach meeks decompression and prgress scapular active motion, sign up for ABC class, later progress exercise and teach Strength ABC prior to dischrge     Consulted and Agree with Plan of Care  Patient       Patient will benefit from skilled therapeutic intervention in order to improve the following deficits and impairments:  Decreased knowledge of precautions, Pain, Impaired UE functional use, Decreased range of motion, Postural dysfunction, Decreased skin integrity, Increased fascial restricitons, Decreased scar mobility, Impaired perceived functional ability, Decreased strength, Decreased activity tolerance, Increased edema  Visit Diagnosis: Aftercare following surgery for neoplasm - Plan: PT plan of care cert/re-cert  Stiffness of left shoulder joint - Plan: PT plan of care cert/re-cert  Muscle weakness (generalized) - Plan: PT plan of care cert/re-cert  Abnormal posture - Plan: PT plan of care cert/re-cert     Problem List Patient  Active Problem List   Diagnosis Date Noted  . Cancer of left female breast  ([place]) 06/13/2018  . Malignant neoplasm of overlapping sites of left breast in female, estrogen receptor positive ([place]) 05/09/2018  . Blurring of visual image of both eyes 12/06/2017  . Postherpetic neuralgia 12/06/2017  . BIPOLAR DISORDER UNSPECIFIED 10/10/2009  . ESOPHAGEAL REFLUX 10/10/2009   Donato Heinz. Owens Shark PT  Norwood Levo 07/22/2018, 5:54 PM  [place], [place], [address] Phone: [phone]   Fax:  [phone]  Name: Dawn Booker MRN: [id number] Date of Birth: Feb 12, 1956

## 2018-07-24 ENCOUNTER — Ambulatory Visit: Payer: 59

## 2018-07-24 DIAGNOSIS — M6281 Muscle weakness (generalized): Secondary | ICD-10-CM

## 2018-07-24 DIAGNOSIS — R293 Abnormal posture: Secondary | ICD-10-CM | POA: Diagnosis not present

## 2018-07-24 DIAGNOSIS — M25612 Stiffness of left shoulder, not elsewhere classified: Secondary | ICD-10-CM | POA: Diagnosis not present

## 2018-07-24 DIAGNOSIS — Z483 Aftercare following surgery for neoplasm: Secondary | ICD-10-CM | POA: Diagnosis not present

## 2018-07-24 NOTE — Therapy (Addendum)
[place], [place], [address] Phone: [phone]   Fax:  [phone]  Physical Therapy Treatment  Patient Details  Name: Dawn Booker MRN: [id number] Date of Birth: [birth year] Referring Provider (PT): Dr. Autumn Messing   Encounter Date: 07/24/2018  PT End of Session - 07/24/18 1105    Visit Number  3    Number of Visits  14    Date for PT Re-Evaluation  09/02/18    PT Start Time  1021    PT Stop Time  0459    PT Time Calculation (min)  44 min    Activity Tolerance  Patient limited by pain;Patient tolerated treatment well    Behavior During Therapy  [facility] for tasks assessed/performed       Past Medical History:  Diagnosis Date  . ADD (attention deficit disorder)   . Anxiety   . Cancer [facility])    breast cancer  . Depression   . GERD (gastroesophageal reflux disease)   . Hiatal hernia   . Hyperlipemia, mixed   . Intermittent palpitations   . Malignant neoplasm of overlapping sites of left breast in female, estrogen receptor positive ([employer]) 05/06/2018   oncologist-  dr Lindi Adie--  Invasive Lobular Cancer, CIS, Stage IB, Grade 2 (pT3,pN1a,cM0), ER+---  s/p  left mastectomy with sln dissections and right mastectomy (benign)  . PONV (postoperative nausea and vomiting)   . Right thyroid nodule   . Shingles 12/2017    Past Surgical History:  Procedure Laterality Date  . AXILLARY LYMPH NODE DISSECTION Left 07/09/2018   Procedure: COMPLETION OF LEFT AXILLARY LYMPH NODE DISSECTION;  Surgeon: Jovita Kussmaul, MD;  [facility];  Service: General;  Laterality: Left;  . BREAST EXCISIONAL BIOPSY Left 1995   benign  . CARDIOVASCULAR STRESS TEST  06/21/2017   normal nuclear stress study w/ no ischemia/  normal LV function and wall function, nuclear stress ef 70%  . ELBOW SURGERY Left child  . MASTECTOMY W/ SENTINEL NODE BIOPSY Left 06/13/2018  . MASTECTOMY WITH RADIOACTIVE SEED GUIDED EXCISION AND AXILLARY  SENTINEL LYMPH NODE BIOPSY Bilateral 06/13/2018   Procedure: LEFT MASTECTOMY WITH SENTINEL LYMPH NODE MAPPING AND TARGETED NODE DISSECTION AND RIGHT PROPHYLATIC MASTECTOMY;  Surgeon: Jovita Kussmaul, MD;  Location: [facility];  Service: General;  Laterality: Bilateral;  . THYROIDECTOMY Left 10-31-2001   dr gerkin '@[facility]'   . TRANSTHORACIC ECHOCARDIOGRAM  06/21/2017   ef 60-65%/  trivial MR (no evidence mvp)  . TUBAL LIGATION  yrs ago  . VAGINAL HYSTERECTOMY  1990s    There were no vitals filed for this visit.  Subjective Assessment - 07/24/18 1025    Subjective  My Lt arm is actually feeling better after Tuesday. I was using it like I haven't been in awhile. I think the sleeve she gave me last time helped as well.     Pertinent History  Patient was diagnosed on 04/25/18 with left grade II invasive lobular carcinoma breast cancer. There are 2 areas: 2.6 cm in the upper outer quadrant and 6 mm in the upper inner quadrant. Both are ER/PR positive and HER2 negative with a Ki67 of 10% and she has a positive axillary node. Bilateral mastectomy on 06/13/2018 wiht 7 nodes removed and the 10 in second surgery on 07/09/2018  She will have radiation  and goes for her simulation on 08/19/2018    Patient Stated Goals  to get the pain and get stronger and range of motion and find  out what my new norm is going     Currently in Pain?  No/denies                       [facility] Adult PT Treatment/Exercise - 07/24/18 0001      Exercises   Exercises  Other Exercises    Other Exercises   Meeks Decompression Exercises 5x, 5 sec holds for each with pt returning therapist demo      Manual Therapy   Manual Therapy  Myofascial release;Passive ROM;Neural Stretch    Myofascial Release  To Lt axilla     Passive ROM  In Supine to Lt shoulder into flexion and abduction    Neural Stretch  To Lt UE             PT Education - 07/24/18 1123    Education Details  Supine dowel exercises and Meeks Decompression     Person(s) Educated  Patient    Methods  Explanation;Demonstration;Handout    Comprehension  Verbalized understanding;Returned demonstration;Need further instruction       PT Short Term Goals - 07/22/18 1750      PT SHORT TERM GOAL #1   Title  Pt will verbalize lymphedema risk reduction practices     Time  4    Period  Weeks    Status  New      PT SHORT TERM GOAL #2   Title  Pt will have 125 degrees if painless left shoulder abduction so that she can receive radiation     Time  4    Period  Weeks    Status  New      PT SHORT TERM GOAL #3   Title  Pt will be independent in a home exercise program for shoulder range of motion     Time  4    Period  Weeks        PT Long Term Goals - 07/22/18 1751      PT LONG TERM GOAL #1   Title  Patient will demonstrate she has regained shoulder ROM and function post operatively compared to baseline measurements.    Time  6    Period  Weeks    Status  New      PT LONG TERM GOAL #2   Title  Pt will be independent in a home exercise program for strengthening     Time  6    Period  Weeks    Status  New            Plan - 07/24/18 1106    Clinical Impression Statement  First session of manual therapy to focus on decreasing tightness from cording. Pt is very tight and tender at axilla with reports of feeling cording into the forearm, but less so. She tolerated stretching well though end motions very limited by pain. Her P/ROM was improved by end of session. Also issued inital HEP for stretching and Meeks Decompression exs which she did well with.    Rehab Potential  Good    Clinical Impairments Affecting Rehab Potential  17 nodes removed with second surgery needed for lymph node dissection     PT Frequency  2x / week    PT Duration  6 weeks    PT Treatment/Interventions  ADLs/Self Care Home Management;DME Instruction;Therapeutic activities;Therapeutic exercise;Orthotic Fit/Training;Patient/family education;Manual  techniques;Neuromuscular re-education;Manual lymph drainage;Compression bandaging;Scar mobilization;Taping;Passive range of motion    PT Next Visit Plan  Review HEP, MLD and stretching  to left arm and cording; progress scapular active motion, sign up for ABC class, later progress exercise and teach Strength ABC prior to dischrge     Consulted and Agree with Plan of Care  Patient       Patient will benefit from skilled therapeutic intervention in order to improve the following deficits and impairments:  Decreased knowledge of precautions, Pain, Impaired UE functional use, Decreased range of motion, Postural dysfunction, Decreased skin integrity, Increased fascial restricitons, Decreased scar mobility, Impaired perceived functional ability, Decreased strength, Decreased activity tolerance, Increased edema  Visit Diagnosis: Aftercare following surgery for neoplasm  Stiffness of left shoulder joint  Muscle weakness (generalized)  Abnormal posture     Problem List Patient Active Problem List   Diagnosis Date Noted  . Cancer of left female breast  ([place]) 06/13/2018  . Malignant neoplasm of overlapping sites of left breast in female, estrogen receptor positive ([place]) 05/09/2018  . Blurring of visual image of both eyes 12/06/2017  . Postherpetic neuralgia 12/06/2017  . BIPOLAR DISORDER UNSPECIFIED 10/10/2009  . ESOPHAGEAL REFLUX 10/10/2009    Otelia Limes , PTA 07/24/2018, 11:25 AM  [place], [place], [address] Phone: [phone]   Fax:  [phone]  Name: Dawn Booker MRN: [id number] Date of Birth: [birth year]

## 2018-07-24 NOTE — Patient Instructions (Addendum)
1. Decompression Exercise     Cancer Rehab [phone]    Lie on back on firm surface, knees bent, feet flat, arms turned up, out to sides, backs of hands down. Time _5-15__ minutes. Surface: floor   2. Shoulder Press    Start in Decompression Exercise position. Press shoulders downward towards supporting surface. Hold __2-3__ seconds while counting out loud. Repeat _3-5___ times. Do _1-2___ times per day.   3. Head Press    Bring cervical spine (neck) into neutral position (by either tucking the chin towards the chest or tilting the chin upward). Feel weight on back of head. Press head downward into supporting surface.    Hold _2-3__ seconds. Repeat _3-5__ times. Do _1-2__ times per day.   4. Leg Lengthener    Straighten one leg. Pull toes AND forefoot toward knee, extend heel. Lengthen leg by pulling pelvis away from ribs. Hold _2-3__ seconds. Relax. Repeat __4-6__ times. Do other leg.  Surface: floor   5. Leg Press    Straighten one leg down to floor keeping leg aligned with hip. Pull toes AND forefoot toward knee; extend heel.  Press entire leg downward (as if pressing leg into sandy beach). DO NOT BEND KNEE. Hold _2-3__ seconds. Do __4-6__ times. Repeat with other leg.   SHOULDER: Flexion - Supine (Cane)        Cancer Rehab [phone]    Hold cane in both hands. Raise arms up overhead. Do not allow back to arch. Hold _5__ seconds. Do __5-10__ times; __1-2__ times a day.   SELF ASSISTED WITH OBJECT: Shoulder Abduction / Adduction - Supine    Hold cane with both hands. Move both arms from side to side, keep elbows straight.  Hold when stretch felt for __5__ seconds. Repeat __5-10__ times; __1-2__ times a day. Once this becomes easier progress to third picture bringing affected arm towards ear by staying out to side. Same hold for _5_seconds. Repeat  _5-10_ times, _1-2_ times/day.  Shoulder Blade Stretch    Clasp fingers behind head with elbows touching in front of  face. Pull elbows back while pressing shoulder blades together. Relax and hold as tolerated, can place pillow under elbow here for comfort as needed and to allow for prolonged stretch.  Repeat __5__ times. Do __1-2__ sessions per day.     SHOULDER: External Rotation - Supine (Cane)    Hold cane with both hands. Rotate arm away from body. Keep elbow on floor and next to body. _5-10__ reps per set, hold 5 seconds, _1-2__ sets per day. Add towel to keep elbow at side.

## 2018-07-28 ENCOUNTER — Ambulatory Visit: Payer: 59 | Admitting: Rehabilitation

## 2018-07-29 MED FILL — TEMAZEPAM 30 MG CAPSULE: 30 | 30 days supply | Qty: 30 | Fill #2

## 2018-07-29 MED FILL — GABAPENTIN 600 MG TABLET: 600 | 30 days supply | Qty: 90 | Fill #0

## 2018-07-30 NOTE — Progress Notes (Signed)
Location of Breast Cancer: Left Breast  Histology per Pathology Report:  05/06/18 Diagnosis 1. Breast, left, needle core biopsy, 10:30 o'clock, ribbon shaped marker - INVASIVE MAMMARY CARCINOMA - SEE COMMENT  Receptor Status: ER(100%), PR (10%), Ki-(15%), Her2-neu (NEG)  2. Breast, left, needle core biopsy, 12:30 o'clock, area of hypoechogenicity, coil shaped marker - INVASIVE MAMMARY CARCINOMA - MAMMARY CARCINOMA IN-SITU - SEE COMMENT 3. Lymph node, needle/core biopsy, left axillary, hydroMARK - METASTATIC CARCINOMA INVOLVING ONE LYMPH NODE (1/1) - SEE  Receptor Status: ER(100%), PR (10%), Her2-neu (NEG), Ki-(10%)  06/13/18 Diagnosis 1. Lymph nodes, regional resection, left with targeted lymph node - METASTATIC BREAST CARCINOMA TO ONE OF TWO LYMPH NODES (1/2). - FOCUS OF METASTATIC CARCINOMA MEASURES 0.6 CM. - EXTRANODAL EXTENSION IS PRESENT. 2. Breast, simple mastectomy, Right - BENIGN BREAST TISSUE WITH FIBROCYSTIC CHANGE. - NEGATIVE FOR CARCINOMA. - LYMPH NODE, NEGATIVE FOR CARCINOMA (0/1) 3. Breast, simple mastectomy, Left - INVASIVE LOBULAR CARCINOMA, GRADE II, 5.4 CM. - LOBULAR CARCINOMA IN SITU, INTERMEDIATE NUCLEAR GRADE. - SURGICAL RESECTION MARGINS ARE NEGATIVE FOR CARCINOMA. - LYMPH VASCULAR INVASION IS IDENTIFIED; PERINEURAL INVASION IS NOT PRESENT. - LYMPH NODE, NEGATIVE FOR CARICNOMA (0/1). - BIOPSY SITE CHANGES. - SEE ONCOLOGY TABLE. 4. Lymph node, sentinel, biopsy, Left axillary - METASTATIC BREAST CARCINOMA TO LYMPH NODE (1/1). - FOCUS OF METASTATIC CARICNOMA MEASURES 0.2 CM WITHOUT EVIDENCE OF EXTRANODAL EXTENSION. 5. Lymph node, sentinel, biopsy, Left axillary - ISOLATED TUMOR CELLS (ITCS) INVOLVING A LYMPH NODE (CONFIRMED WITH IMMUNOSTAIN FOR PAN-KERATIN) 6. Lymph node, sentinel, biopsy, Left axillary - LYMPH NODE, NEGATIVE FOR CARCINOMA (0/1). - IMMUNOSTAIN FOR PAN-KERATIN IS NEGATIVE 7. Lymph node, sentinel, biopsy, Left axillary - LYMPH NODE,  NEGATIVE FOR CARCINOMA (0/1). - IMMUNOSTAIN FOR PAN-KERATIN IS NEGATIVE  07/09/18 Diagnosis Lymph nodes, regional resection, left axillary contents - TEN LYMPH NODES; NEGATIVE FOR CARCINOMA (0/10). - THREE FOCI OF FIBROSIS, ORGANIZING FAT NECROSIS AND MULTINUCLEATED GIANT CELLS; NEGATIVE FOR CARCINOMA.  Did patient present with symptoms or was this found on screening mammography?: It was found on a screening mammogram.   Past/Anticipated interventions by surgeon, if any: 06/13/18 PROCEDURE:  Procedure(s): LEFT MASTECTOMY WITH DEEP LEFT AXILLARY SENTINEL LYMPH NODE MAPPING AND TARGETED NODE DISSECTION WITH RADIOACTIVE SEED AND RIGHT PROPHYLATIC MASTECTOMY (Bilateral) SURGEON:  Surgeon(s) and Role:    Jovita Kussmaul, MD - Primary  07/09/18 PROCEDURE:  Procedure(s): COMPLETION OF DEEP LEFT AXILLARY LYMPH NODE DISSECTION (Left) SURGEON:  Surgeon(s) and Role:    Jovita Kussmaul, MD - Primary   Past/Anticipated interventions by medical oncology, if any: 06/24/18 Dr. Lindi Adie Treatment plan: 1. Mammaprint testing and the final pathology 2.Adjuvant radiation therapy 3.Adjuvant antiestrogen therapy with letrozole 2.5 mg daily x5 to 7 years We will evaluate for Natalee clinical trial Return to clinic based upon Mammaprint test result  06/26/18 Sigmund Hazel (breast navigator) Received mammaprint resutls of low risk. Physician team notified. Called pt with results and discussed chemotherapy is not recommended. Pt still processing ALND recommendation. Will inform team of her final decision  Lymphedema issues, if any:  She reports Lymphedema to her lateral left chest. She is seeing PT for ROM problems.   Pain issues, if any:  Yes, she reports pain a 4/10 to her left breast surgical site.   SAFETY ISSUES:  Prior radiation? No  Pacemaker/ICD? No  Possible current pregnancy? No  Is the patient on methotrexate? No  Current Complaints / other details:    BP 125/88 (BP Location:  Right Arm, Patient Position: Sitting)  Pulse 81   Temp 98.2 F (36.8 C) (Oral)   Resp 20   Ht 5' 5.5" (1.664 m)   Wt 154 lb 3.2 oz (69.9 kg)   SpO2 100%   BMI 25.27 kg/m    Wt Readings from Last 3 Encounters:  08/06/18 154 lb 3.2 oz (69.9 kg)  07/09/18 147 lb 4 oz (66.8 kg)  07/04/18 147 lb 4 oz (66.8 kg)      Zanaria Morell, Stephani Police, RN 07/30/2018,4:29 PM

## 2018-08-04 ENCOUNTER — Encounter: Payer: Self-pay | Admitting: Physical Therapy

## 2018-08-05 ENCOUNTER — Encounter: Payer: Self-pay | Admitting: Physical Therapy

## 2018-08-05 ENCOUNTER — Ambulatory Visit: Payer: 59 | Admitting: Physical Therapy

## 2018-08-05 ENCOUNTER — Other Ambulatory Visit: Payer: Self-pay

## 2018-08-05 DIAGNOSIS — M6281 Muscle weakness (generalized): Secondary | ICD-10-CM | POA: Insufficient documentation

## 2018-08-05 DIAGNOSIS — Z170 Estrogen receptor positive status [ER+]: Secondary | ICD-10-CM | POA: Insufficient documentation

## 2018-08-05 DIAGNOSIS — C50812 Malignant neoplasm of overlapping sites of left female breast: Secondary | ICD-10-CM

## 2018-08-05 DIAGNOSIS — R293 Abnormal posture: Secondary | ICD-10-CM | POA: Insufficient documentation

## 2018-08-05 DIAGNOSIS — M25612 Stiffness of left shoulder, not elsewhere classified: Secondary | ICD-10-CM

## 2018-08-05 DIAGNOSIS — Z483 Aftercare following surgery for neoplasm: Secondary | ICD-10-CM

## 2018-08-05 DIAGNOSIS — Z510 Encounter for antineoplastic radiation therapy: Secondary | ICD-10-CM | POA: Diagnosis not present

## 2018-08-05 NOTE — Therapy (Signed)
[place], [place], [address] Phone: [phone]   Fax:  [phone]  Physical Therapy Treatment  Patient Details  Name: Dawn Booker MRN: [id number] Date of Birth: 04-28-1956 Referring Provider (PT): Dr. Autumn Messing   Encounter Date: 08/05/2018  PT End of Session - 08/05/18 1220    Visit Number  4    Number of Visits  14    Date for PT Re-Evaluation  09/02/18    PT Start Time  0805    PT Stop Time  7412    PT Time Calculation (min)  42 min    Activity Tolerance  Patient tolerated treatment well    Behavior During Therapy  [facility] for tasks assessed/performed       Past Medical History:  Diagnosis Date  . ADD (attention deficit disorder)   . Anxiety   . Cancer [facility])    breast cancer  . Depression   . GERD (gastroesophageal reflux disease)   . Hiatal hernia   . Hyperlipemia, mixed   . Intermittent palpitations   . Malignant neoplasm of overlapping sites of left breast in female, estrogen receptor positive ([place]) 05/06/2018   oncologist-  dr Lindi Adie--  Invasive Lobular Cancer, CIS, Stage IB, Grade 2 (pT3,pN1a,cM0), ER+---  s/p  left mastectomy with sln dissections and right mastectomy (benign)  . PONV (postoperative nausea and vomiting)   . Right thyroid nodule   . Shingles 12/2017    Past Surgical History:  Procedure Laterality Date  . AXILLARY LYMPH NODE DISSECTION Left 07/09/2018   Procedure: COMPLETION OF LEFT AXILLARY LYMPH NODE DISSECTION;  Surgeon: Jovita Kussmaul, MD;  Location: [facility];  Service: General;  Laterality: Left;  . BREAST EXCISIONAL BIOPSY Left 1995   benign  . CARDIOVASCULAR STRESS TEST  06/21/2017   normal nuclear stress study w/ no ischemia/  normal LV function and wall function, nuclear stress ef 70%  . ELBOW SURGERY Left child  . MASTECTOMY W/ SENTINEL NODE BIOPSY Left 06/13/2018  . MASTECTOMY WITH RADIOACTIVE SEED GUIDED EXCISION AND AXILLARY SENTINEL LYMPH NODE BIOPSY  Bilateral 06/13/2018   Procedure: LEFT MASTECTOMY WITH SENTINEL LYMPH NODE MAPPING AND TARGETED NODE DISSECTION AND RIGHT PROPHYLATIC MASTECTOMY;  Surgeon: Jovita Kussmaul, MD;  Location: [facility];  Service: General;  Laterality: Bilateral;  . THYROIDECTOMY Left 10-31-2001   dr gerkin '@[facility]'   . TRANSTHORACIC ECHOCARDIOGRAM  06/21/2017   ef 60-65%/  trivial MR (no evidence mvp)  . TUBAL LIGATION  yrs ago  . VAGINAL HYSTERECTOMY  1990s    There were no vitals filed for this visit.  Subjective Assessment - 08/05/18 0809    Subjective  I much better ROM in my left arm since doing my exercises. I have a few questions about the exercises. Saturday was the first time I took  my shirt off and my arm went above my head. For two days I could not sleep in the bed I had to sleep on the couch. I feel like I am having swelling on my left side.     Pertinent History  Patient was diagnosed on 04/25/18 with left grade II invasive lobular carcinoma breast cancer. There are 2 areas: 2.6 cm in the upper outer quadrant and 6 mm in the upper inner quadrant. Both are ER/PR positive and HER2 negative with a Ki67 of 10% and she has a positive axillary node. Bilateral mastectomy on 06/13/2018 wiht 7 nodes removed and the 10 in second surgery on 07/09/2018  She will have radiation  and goes for her simulation on 08/19/2018    Patient Stated Goals  to get the pain and get stronger and range of motion and find out what my new norm is going     Currently in Pain?  No/denies    Pain Score  0-No pain                       [facility] Adult PT Treatment/Exercise - 08/05/18 0001      Exercises   Other Exercises   instructed pt in pelvic tilt to do while doing Meeks exercises since pt wanted to strengthening core muscles, provided verbal and tactile cues for pt to perform correctly      Manual Therapy   Manual Therapy  Manual Lymphatic Drainage (MLD);Passive ROM;Myofascial release    Myofascial Release  to left mastectomy  scar    Manual Lymphatic Drainage (MLD)  in supine: short neck, 5 diaphragmatic breaths, left inguinal nodes and establishment of axillo inguinal pathway then spent time on area of swelling in left lateral trunk, then to R sidelying to focus on area just inferior to left axilla where there is increased swelling then back to supine retracing all steps    Passive ROM  In Supine to Lt shoulder into flexion and abduction               PT Short Term Goals - 07/22/18 1750      PT SHORT TERM GOAL #1   Title  Pt will verbalize lymphedema risk reduction practices     Time  4    Period  Weeks    Status  New      PT SHORT TERM GOAL #2   Title  Pt will have 125 degrees if painless left shoulder abduction so that she can receive radiation     Time  4    Period  Weeks    Status  New      PT SHORT TERM GOAL #3   Title  Pt will be independent in a home exercise program for shoulder range of motion     Time  4    Period  Weeks        PT Long Term Goals - 07/22/18 1751      PT LONG TERM GOAL #1   Title  Patient will demonstrate she has regained shoulder ROM and function post operatively compared to baseline measurements.    Time  6    Period  Weeks    Status  New      PT LONG TERM GOAL #2   Title  Pt will be independent in a home exercise program for strengthening     Time  6    Period  Weeks    Status  New            Plan - 08/05/18 1220    Clinical Impression Statement  Pt has increased swelling in left lateral trunk today so added MLD to address this. Pt reports her cording has improved. Continued with PROM to L shoulder and scar mobilization to left mastectomy scar. Showed pt how to do a pelvic tilt and instructed her to hold this throughout Meeks decompression exercises.     Rehab Potential  Good    Clinical Impairments Affecting Rehab Potential  17 nodes removed with second surgery needed for lymph node dissection     PT Frequency  2x / week  PT Duration  6 weeks     PT Treatment/Interventions  ADLs/Self Care Home Management;DME Instruction;Therapeutic activities;Therapeutic exercise;Orthotic Fit/Training;Patient/family education;Manual techniques;Neuromuscular re-education;Manual lymph drainage;Compression bandaging;Scar mobilization;Taping;Passive range of motion    PT Next Visit Plan  Review HEP, MLD to left trunk, and stretching to left arm and cording; progress scapular active motion, sign up for ABC class, later progress exercise and teach Strength ABC prior to dischrge     PT Home Exercise Plan  Post op shoulder ROM HEP    Consulted and Agree with Plan of Care  Patient       Patient will benefit from skilled therapeutic intervention in order to improve the following deficits and impairments:  Decreased knowledge of precautions, Pain, Impaired UE functional use, Decreased range of motion, Postural dysfunction, Decreased skin integrity, Increased fascial restricitons, Decreased scar mobility, Impaired perceived functional ability, Decreased strength, Decreased activity tolerance, Increased edema  Visit Diagnosis: Stiffness of left shoulder joint  Aftercare following surgery for neoplasm     Problem List Patient Active Problem List   Diagnosis Date Noted  . Cancer of left female breast  ([place]) 06/13/2018  . Malignant neoplasm of overlapping sites of left breast in female, estrogen receptor positive ([place]) 05/09/2018  . Blurring of visual image of both eyes 12/06/2017  . Postherpetic neuralgia 12/06/2017  . BIPOLAR DISORDER UNSPECIFIED 10/10/2009  . ESOPHAGEAL REFLUX 10/10/2009    Allyson Sabal [facility] 08/05/2018, 12:24 PM  [place], [place], [address] Phone: [phone]   Fax:  [phone]  Name: Dawn Booker MRN: [id number] Date of Birth: 02/09/1956  Manus Gunning, PT 08/05/18 12:24 PM

## 2018-08-06 ENCOUNTER — Ambulatory Visit
Admission: RE | Admit: 2018-08-06 | Discharge: 2018-08-06 | Disposition: A | Discharge status: Home / Self Care | Payer: 59 | Source: Ambulatory Visit | Attending: Radiation Oncology | Admitting: Radiation Oncology

## 2018-08-06 ENCOUNTER — Encounter: Payer: Self-pay | Admitting: Radiation Oncology

## 2018-08-06 ENCOUNTER — Other Ambulatory Visit: Payer: Self-pay

## 2018-08-06 DIAGNOSIS — Z510 Encounter for antineoplastic radiation therapy: Secondary | ICD-10-CM | POA: Diagnosis not present

## 2018-08-06 DIAGNOSIS — Z170 Estrogen receptor positive status [ER+]: Secondary | ICD-10-CM | POA: Insufficient documentation

## 2018-08-06 DIAGNOSIS — C50812 Malignant neoplasm of overlapping sites of left female breast: Secondary | ICD-10-CM | POA: Insufficient documentation

## 2018-08-06 DIAGNOSIS — Z9013 Acquired absence of bilateral breasts and nipples: Secondary | ICD-10-CM | POA: Diagnosis not present

## 2018-08-06 DIAGNOSIS — I972 Postmastectomy lymphedema syndrome: Secondary | ICD-10-CM | POA: Diagnosis not present

## 2018-08-06 NOTE — Progress Notes (Signed)
Radiation Oncology         [phone] ________________________________  Name: Dawn Booker MRN: [id number]  Date: 08/06/2018  DOB: August 26, 1956  SIMULATION AND TREATMENT PLANNING NOTE   Special Treatment Procedure Note  Outpatient  DIAGNOSIS:     ICD-10-CM   1. Malignant neoplasm of overlapping sites of left breast in female, estrogen receptor positive ([place]) C50.812    Z17.0     NARRATIVE:  The patient was brought to the [facility].  Identity was confirmed.  All relevant records and images related to the planned course of therapy were reviewed.  The patient freely provided informed written consent to proceed with treatment after reviewing the details related to the planned course of therapy. The consent form was witnessed and verified by the simulation staff.    Then, the patient was set-up in a stable reproducible supine position for radiation therapy with her ipsilateral arm over her head, and her upper body secured in a custom-made Vac-lok device.  CT images were obtained.  Surface markings were placed.  The CT images were loaded into the planning software.   Special treatment procedure:  Special treatment procedure was performed today due to the extra time and effort required by myself to plan and prepare this patient for deep inspiration breath hold technique.  I have determined cardiac sparing to be of benefit to this patient to prevent long term cardiac damage due to radiation of the heart.  Bellows were placed on the patient's abdomen. To facilitate cardiac sparing, the patient was coached by the radiation therapists on breath hold techniques and breathing practice was performed. Practice waveforms were obtained. The patient was then scanned while maintaining breath hold in the treatment position.  This image was then transferred over to the imaging specialist. The imaging specialist then created a fusion of the free breathing and breath hold scans using the chest  wall as the stable structure. I personally reviewed the fusion in axial, coronal and sagittal image planes.  Excellent cardiac sparing was obtained.  I felt the patient is an appropriate candidate for breath hold and the patient will be treated as such.  The image fusion was then reviewed with the patient to reinforce the necessity of reproducible breath hold.  TREATMENT PLANNING NOTE: Treatment planning then occurred.  The radiation prescription was entered and confirmed.   A total of 5 medically necessary complex treatment devices were fabricated and supervised by me: 4 fields with MLCs for custom blocks to protect heart, and lungs;  and, a Vac-lok. MORE COMPLEX DEVICES MAY BE MADE IN DOSIMETRY FOR FIELD IN FIELD BEAMS FOR DOSE HOMOGENEITY.  I have requested : 3D Simulation which is medically necessary to give adequate dose to at risk tissues while sparing lungs and heart.  I have requested a DVH of the following structures: lungs, heart, esophagus, spinal cord.    The patient will receive 50 Gy in 25 fractions to the left chest wall and regional nodes with 4 fields.  This will be followed by a boost.  Optical Surface Tracking Plan:  Since intensity modulated radiotherapy (IMRT) and 3D conformal radiation treatment methods are predicated on accurate and precise positioning for treatment, intrafraction motion monitoring is medically necessary to ensure accurate and safe treatment delivery. The ability to quantify intrafraction motion without excessive ionizing radiation dose can only be performed with optical surface tracking. Accordingly, surface imaging offers the opportunity to obtain 3D measurements of patient position throughout IMRT and 3D treatments without  excessive radiation exposure. I am ordering optical surface tracking for this patient's upcoming course of radiotherapy.  ________________________________   Reference:  Ursula Alert, J, et al. Surface imaging-based analysis of  intrafraction motion for breast radiotherapy patients.Journal of [employer], n. 6, nov. 2014. ISSN [id number].  Available at: <http://www.jacmp.org/index.php/jacmp/article/view/4957>.    -----------------------------------  Eppie Gibson, MD

## 2018-08-06 NOTE — Progress Notes (Signed)
Radiation Oncology         [phone]) [phone] ________________________________  Name: Dawn Booker MRN: [id number]  Date: 08/06/2018  DOB: 12-Nov-1955  Follow-Up Visit Note  Outpatient  CC: Lawerance Cruel, MD  Nicholas Lose, MD  Diagnosis:      ICD-10-CM   1. Malignant neoplasm of overlapping sites of left breast in female, estrogen receptor positive ([place]) C50.812    Z17.0    Cancer Staging Malignant neoplasm of overlapping sites of left breast in female, estrogen receptor positive ([place]) Staging form: Breast, AJCC 8th Edition - Clinical stage from 05/21/2018: Stage IIA (cT2, cN1, cM0, G2, ER+, PR+, HER2-) - Unsigned - Pathologic: No stage assigned - Unsigned pT3, pN1a  CHIEF COMPLAINT: Here to discuss management of left breast cancer  Narrative:  The patient returns today for follow-up.   The patient's Mammaprint result came back low-risk, indicating no need for chemotherapy.  The patient underwent bilateral mastectomies and node dissection on 06/13/18. Final pathology revealed grade 2 invasive lobular carcinoma of the left breast, measuring 5.4 cm, ER positive/PR positive/Her2 negative, with intermediate grade lobular carcinoma in situ. Surgical resection margins were negative. Lymphovascular invasion was identified; perineural invasion not present. One targeted regional lymph node revealed metastatic breast carcinoma, measuring 0.6 cm, with extranodal extension. There was also a left axillary lymph node positive for metastatic breast carcinoma, measuring 0.2 cm, without evidence of extranodal extension. One left axillary lymph node showed isolated tumor cells.   The patient then underwent completed left axillary lymph node dissection on 07/09/18. All 10 lymph nodes removed were negative for carcinoma.  Symptomatically, the patient reports: 4/10 pain to her left breast surgical site. She denies any drainage from the surgical sites. She reports lymphedema to her upper left arm and  lateral left chest. She reports cording. She is seeing PT for ROM issues and states that it has improved.        ALLERGIES:  is allergic to lamictal [lamotrigine]; hydrocodone; sulfa antibiotics; brintellix [vortioxetine]; corticosteroids; morphine; other; prednisone; and zoloft [sertraline].  Meds: Current Outpatient Medications  Medication Sig Dispense Refill  . acetaminophen (TYLENOL) 500 MG tablet Take 1,000 mg by mouth at bedtime.     Marland Kitchen buPROPion (WELLBUTRIN XL) 150 MG 24 hr tablet Take 150 mg by mouth daily.     . Cholecalciferol (VITAMIN D) 2000 units CAPS Take 2,000 Units by mouth daily.     . diphenhydrAMINE (BENADRYL) 25 mg capsule Take 25 mg by mouth at bedtime.    . folic acid (FOLVITE) 315 MCG tablet Take 400 mcg by mouth daily.    Marland Kitchen gabapentin (NEURONTIN) 300 MG capsule 2 capsules 3 times daily, 3 capsules at night (Patient taking differently: Take 600 mg by mouth every evening. 600 mg in the evening) 270 capsule 5  . hydrOXYzine (VISTARIL) 25 MG capsule Take 25 mg by mouth as needed for anxiety.    Marland Kitchen omeprazole (PRILOSEC) 40 MG capsule Take 40 mg by mouth daily.  4  . rosuvastatin (CRESTOR) 10 MG tablet Take 10 mg by mouth daily.    . temazepam (RESTORIL) 30 MG capsule Take 30 mg by mouth at bedtime.     Marland Kitchen HYDROcodone-acetaminophen (NORCO/VICODIN) 5-325 MG tablet Take 1-2 tablets by mouth every 6 (six) hours as needed for moderate pain or severe pain. (Patient not taking: Reported on 07/22/2018) 15 tablet 0  . promethazine (PHENERGAN) 25 MG tablet Take 25 mg by mouth every 6 (six) hours as needed. for nausea  0  .  rosuvastatin (CRESTOR) 10 MG tablet Take 1 tablet (10 mg total) by mouth daily. 30 tablet 11  . traMADol (ULTRAM) 50 MG tablet Take 1-2 tablets (50-100 mg total) by mouth every 6 (six) hours as needed. (Patient not taking: Reported on 07/22/2018) 20 tablet 1   No current facility-administered medications for this encounter.     Physical Findings:  height is 5' 5.5"  (1.664 m) and weight is 154 lb 3.2 oz (69.9 kg). Her oral temperature is 98.2 F (36.8 C). Her blood pressure is 125/88 and her pulse is 81. Her respiration is 20 and oxygen saturation is 100%.      General: Alert and oriented, in no acute distress. HEENT: Head is normocephalic.  Neck: Neck is supple, no palpable cervical or supraclavicular lymphadenopathy. Heart: Regular in rate and rhythm with no murmurs, rubs, or gallops. Chest: Clear to auscultation bilaterally, with no rhonchi, wheezes, or rales. Abdomen: Soft, nontender, nondistended, with no rigidity or guarding. Extremities: No cyanosis or edema. Lymphatics: Mild lymphedema in the lateral left chest wall. No obvious lympedema in arms. Musculoskeletal: Decent ROM in shoulders. Neurologic: No obvious focalities. Speech is fluent.  Psychiatric: Judgment and insight are intact. Affect is appropriate. Breast exam: On the left, the mastectomy scar has healed well with no oozing and no sign of recurrence. On the right, no sign of recurrence and the mastectomy scar has healed nicely.  Lab Findings: Lab Results  Component Value Date   WBC 6.4 07/04/2018   HGB 12.6 07/04/2018   HCT 39.9 07/04/2018   MCV 98.5 07/04/2018   PLT 202 07/04/2018    Radiographic Findings: No results found.  Impression/Plan: Left Breast Cancer We discussed adjuvant radiotherapy today.  I recommend radiotherapy to the left chest wall and regional nodes in order to reduce risk of locoregional recurrence by 2/3. The risks, benefits and side effects of this treatment were discussed in detail.  She understands that radiotherapy is associated with skin irritation and fatigue in the acute setting. Late effects can include cosmetic changes and rare injury to internal organs.   She is enthusiastic about proceeding with treatment. A consent form has been signed and placed in her chart.  A total of 5 medically necessary complex treatment devices will be fabricated and  supervised by me: 4 fields with MLCs for custom blocks to protect heart, and lungs;  and, a Vac-lok. MORE COMPLEX DEVICES MAY BE MADE IN DOSIMETRY FOR FIELD IN FIELD BEAMS FOR DOSE HOMOGENEITY.  I have requested : 3D Simulation which is medically necessary to give adequate dose to at risk tissues while sparing lungs and heart.  I have requested a DVH of the following structures: lungs, heart, esophagus, spinal cord.    The patient will receive 50 Gy in 25 fractions to the left chest wall and regional nodes with 4 fields. This will be followed by a boost.  Continue PT for lymphedema.  Simulation today, tx starting next week.  I spent 20 minutes face to face with the patient and more than 50% of that time was spent in counseling and/or coordination of care. _____________________________________   Eppie Gibson, MD  This document serves as a record of services personally performed by Eppie Gibson, MD. It was created on her behalf by Rae Lips, a trained medical scribe. The creation of this record is based on the scribe's personal observations and the provider's statements to them. This document has been checked and approved by the attending provider.

## 2018-08-07 ENCOUNTER — Encounter: Payer: Self-pay | Admitting: Physical Therapy

## 2018-08-07 ENCOUNTER — Ambulatory Visit: Payer: 59 | Admitting: Physical Therapy

## 2018-08-07 DIAGNOSIS — Z483 Aftercare following surgery for neoplasm: Secondary | ICD-10-CM

## 2018-08-07 DIAGNOSIS — Z170 Estrogen receptor positive status [ER+]: Secondary | ICD-10-CM | POA: Diagnosis not present

## 2018-08-07 DIAGNOSIS — R293 Abnormal posture: Secondary | ICD-10-CM

## 2018-08-07 DIAGNOSIS — C50812 Malignant neoplasm of overlapping sites of left female breast: Secondary | ICD-10-CM | POA: Diagnosis not present

## 2018-08-07 DIAGNOSIS — Z510 Encounter for antineoplastic radiation therapy: Secondary | ICD-10-CM | POA: Diagnosis not present

## 2018-08-07 DIAGNOSIS — M25612 Stiffness of left shoulder, not elsewhere classified: Secondary | ICD-10-CM

## 2018-08-07 DIAGNOSIS — M6281 Muscle weakness (generalized): Secondary | ICD-10-CM

## 2018-08-07 NOTE — Therapy (Signed)
[place], [place], [address] Phone: [phone]   Fax:  [phone]  Physical Therapy Treatment  Patient Details  Name: Dawn Booker MRN: [id number] Date of Birth: [birth year] Referring Provider (PT): Dr. Autumn Messing   Encounter Date: 08/07/2018  PT End of Session - 08/07/18 1626    Visit Number  5    Number of Visits  14    Date for PT Re-Evaluation  09/02/18    PT Start Time  3291    PT Stop Time  1430    PT Time Calculation (min)  45 min    Activity Tolerance  Patient tolerated treatment well    Behavior During Therapy  [facility] for tasks assessed/performed       Past Medical History:  Diagnosis Date  . ADD (attention deficit disorder)   . Anxiety   . Cancer [facility])    breast cancer  . Depression   . GERD (gastroesophageal reflux disease)   . Hiatal hernia   . Hyperlipemia, mixed   . Intermittent palpitations   . Malignant neoplasm of overlapping sites of left breast in female, estrogen receptor positive ([place]) 05/06/2018   oncologist-  dr Lindi Adie--  Invasive Lobular Cancer, CIS, Stage IB, Grade 2 (pT3,pN1a,cM0), ER+---  s/p  left mastectomy with sln dissections and right mastectomy (benign)  . PONV (postoperative nausea and vomiting)   . Right thyroid nodule   . Shingles 12/2017    Past Surgical History:  Procedure Laterality Date  . AXILLARY LYMPH NODE DISSECTION Left 07/09/2018   Procedure: COMPLETION OF LEFT AXILLARY LYMPH NODE DISSECTION;  Surgeon: Jovita Kussmaul, MD;  [facility];  Service: General;  Laterality: Left;  . BREAST EXCISIONAL BIOPSY Left 1995   benign  . CARDIOVASCULAR STRESS TEST  06/21/2017   normal nuclear stress study w/ no ischemia/  normal LV function and wall function, nuclear stress ef 70%  . ELBOW SURGERY Left child  . MASTECTOMY W/ SENTINEL NODE BIOPSY Left 06/13/2018  . MASTECTOMY WITH RADIOACTIVE SEED GUIDED EXCISION AND AXILLARY SENTINEL LYMPH NODE BIOPSY  Bilateral 06/13/2018   Procedure: LEFT MASTECTOMY WITH SENTINEL LYMPH NODE MAPPING AND TARGETED NODE DISSECTION AND RIGHT PROPHYLATIC MASTECTOMY;  Surgeon: Jovita Kussmaul, MD;  Location: [place];  Service: General;  Laterality: Bilateral;  . THYROIDECTOMY Left 10-31-2001   dr gerkin _0   . TRANSTHORACIC ECHOCARDIOGRAM  06/21/2017   ef 60-65%/  trivial MR (no evidence mvp)  . TUBAL LIGATION  yrs ago  . VAGINAL HYSTERECTOMY  1990s    There were no vitals filed for this visit.  Subjective Assessment - 08/07/18 1352    Subjective  Pt states she want for her Simulation for her radiation yesterday.  She did well with getting her arm into position.  She will have 30 treatment starting next Weds.  She plans to go back to work Dec. 19. She is still having cording in left arm but her range of motion is so much better     Pertinent History  Patient was diagnosed on 04/25/18 with left grade II invasive lobular carcinoma breast cancer. There are 2 areas: 2.6 cm in the upper outer quadrant and 6 mm in the upper inner quadrant. Both are ER/PR positive and HER2 negative with a Ki67 of 10% and she has a positive axillary node. Bilateral mastectomy on 06/13/2018 wiht 7 nodes removed and the 10 in second surgery on 07/09/2018  She will have radiation  and goes for her  simulation on 08/19/2018    Patient Stated Goals  to get the pain and get stronger and range of motion and find out what my new norm is going     Currently in Pain?  Yes    Pain Score  4     Pain Location  Arm    Pain Orientation  Left    Pain Descriptors / Indicators  [name] Adult PT Treatment/Exercise - 08/07/18 0001      Self-Care   Other Self-Care Comments   encouraged to to wear TG soft to help resolve cording       Exercises   Exercises  Shoulder;Other Exercises    Other Exercises   encouraged deep diaphragmatic breating throughout       Shoulder Exercises: Supine   Protraction  AROM;Both;10  reps    Flexion  AAROM;Both;10 reps    Flexion Limitations  with dowel, incorporating deep breathing     Other Supine Exercises  lower trunk rotation with arms in goal post position     Other Supine Exercises  small circles with hand pointed to ceiling       Shoulder Exercises: Sidelying   ABduction  AROM;Left;5 reps    ABduction Limitations  as tolerated within pain limits     Other Sidelying Exercises  small circles with hand pointed to ceiling       Manual Therapy   Manual Therapy  Manual Lymphatic Drainage (MLD);Passive ROM;Myofascial release    Manual Lymphatic Drainage (MLD)  briefly to left laterall chest anterior chest and arm in supine and sidelying with gentle stretching to cording     Passive ROM  In Supine to Lt shoulder into flexion and abduction               PT Short Term Goals - 07/22/18 1750      PT SHORT TERM GOAL #1   Title  Pt will verbalize lymphedema risk reduction practices     Time  4    Period  Weeks    Status  New      PT SHORT TERM GOAL #2   Title  Pt will have 125 degrees if painless left shoulder abduction so that she can receive radiation     Time  4    Period  Weeks    Status  New      PT SHORT TERM GOAL #3   Title  Pt will be independent in a home exercise program for shoulder range of motion     Time  4    Period  Weeks        PT Long Term Goals - 07/22/18 1751      PT LONG TERM GOAL #1   Title  Patient will demonstrate she has regained shoulder ROM and function post operatively compared to baseline measurements.    Time  6    Period  Weeks    Status  New      PT LONG TERM GOAL #2   Title  Pt will be independent in a home exercise program for strengthening     Time  6    Period  Weeks    Status  New            Plan - 08/07/18 1626    Clinical Impression Statement  Pt continues with guitar string cording  in left arm, especailly around medial eblow.  She has improved shoulder flexion and active movement, but has some  fullness in left chest and abdomen near drain sites     Clinical Impairments Affecting Rehab Potential  17 nodes removed with second surgery needed for lymph node dissection     PT Frequency  2x / week    PT Duration  6 weeks    PT Treatment/Interventions  ADLs/Self Care Home Management;DME Instruction;Therapeutic activities;Therapeutic exercise;Orthotic Fit/Training;Patient/family education;Manual techniques;Neuromuscular re-education;Manual lymph drainage;Compression bandaging;Scar mobilization;Taping;Passive range of motion    PT Next Visit Plan   Assess goals Review HEP, MLD to left trunk, and stretching to left arm and cording; progress scapular active motion, sign up for ABC class, later progress exercise and teach Strength ABC prior to dischrge        Patient will benefit from skilled therapeutic intervention in order to improve the following deficits and impairments:  Decreased knowledge of precautions, Pain, Impaired UE functional use, Decreased range of motion, Postural dysfunction, Decreased skin integrity, Increased fascial restricitons, Decreased scar mobility, Impaired perceived functional ability, Decreased strength, Decreased activity tolerance, Increased edema  Visit Diagnosis: Stiffness of left shoulder joint  Aftercare following surgery for neoplasm  Muscle weakness (generalized)  Abnormal posture     Problem List Patient Active Problem List   Diagnosis Date Noted  . Cancer of left female breast  ([place]) 06/13/2018  . Malignant neoplasm of overlapping sites of left breast in female, estrogen receptor positive ([place]) 05/09/2018  . Blurring of visual image of both eyes 12/06/2017  . Postherpetic neuralgia 12/06/2017  . BIPOLAR DISORDER UNSPECIFIED 10/10/2009  . ESOPHAGEAL REFLUX 10/10/2009   Donato Heinz. Owens Shark PT  Norwood Levo 08/07/2018, 4:28 PM  [place], [place],  [address] Phone: [phone]   Fax:  [phone]  Name: Dawn Booker MRN: [id number] Date of Birth: 1956-07-31

## 2018-08-08 ENCOUNTER — Telehealth: Payer: Self-pay | Admitting: Hematology and Oncology

## 2018-08-08 NOTE — Telephone Encounter (Signed)
Scheduled appt per 12/6 sch message - sent reminder letter in the mail with appt date and time

## 2018-08-11 DIAGNOSIS — Z170 Estrogen receptor positive status [ER+]: Secondary | ICD-10-CM | POA: Diagnosis not present

## 2018-08-11 DIAGNOSIS — C50812 Malignant neoplasm of overlapping sites of left female breast: Secondary | ICD-10-CM | POA: Diagnosis not present

## 2018-08-11 DIAGNOSIS — Z510 Encounter for antineoplastic radiation therapy: Secondary | ICD-10-CM | POA: Diagnosis not present

## 2018-08-12 ENCOUNTER — Encounter: Payer: Self-pay | Admitting: Physical Therapy

## 2018-08-12 ENCOUNTER — Ambulatory Visit: Payer: 59 | Admitting: Physical Therapy

## 2018-08-12 DIAGNOSIS — Z483 Aftercare following surgery for neoplasm: Secondary | ICD-10-CM

## 2018-08-12 DIAGNOSIS — R293 Abnormal posture: Secondary | ICD-10-CM

## 2018-08-12 DIAGNOSIS — Z510 Encounter for antineoplastic radiation therapy: Secondary | ICD-10-CM | POA: Diagnosis not present

## 2018-08-12 DIAGNOSIS — C50812 Malignant neoplasm of overlapping sites of left female breast: Secondary | ICD-10-CM | POA: Diagnosis not present

## 2018-08-12 DIAGNOSIS — Z170 Estrogen receptor positive status [ER+]: Secondary | ICD-10-CM | POA: Diagnosis not present

## 2018-08-12 DIAGNOSIS — M25612 Stiffness of left shoulder, not elsewhere classified: Secondary | ICD-10-CM

## 2018-08-12 NOTE — Therapy (Signed)
[place], [place], [address] Phone: [phone]   Fax:  [phone]  Physical Therapy Treatment  Patient Details  Name: Dawn Booker MRN: [id number] Date of Birth: [birth year] Referring Provider (PT): Dr. Autumn Messing   Encounter Date: 08/12/2018  PT End of Session - 08/12/18 1702    Visit Number  6    Number of Visits  14    Date for PT Re-Evaluation  09/02/18    PT Start Time  1600    PT Stop Time  1645    PT Time Calculation (min)  45 min    Activity Tolerance  Patient tolerated treatment well    Behavior During Therapy  [facility] for tasks assessed/performed       Past Medical History:  Diagnosis Date  . ADD (attention deficit disorder)   . Anxiety   . Cancer [facility])    breast cancer  . Depression   . GERD (gastroesophageal reflux disease)   . Hiatal hernia   . Hyperlipemia, mixed   . Intermittent palpitations   . Malignant neoplasm of overlapping sites of left breast in female, estrogen receptor positive ([place]) 05/06/2018   oncologist-  dr Lindi Adie--  Invasive Lobular Cancer, CIS, Stage IB, Grade 2 (pT3,pN1a,cM0), ER+---  s/p  left mastectomy with sln dissections and right mastectomy (benign)  . PONV (postoperative nausea and vomiting)   . Right thyroid nodule   . Shingles 12/2017    Past Surgical History:  Procedure Laterality Date  . AXILLARY LYMPH NODE DISSECTION Left 07/09/2018   Procedure: COMPLETION OF LEFT AXILLARY LYMPH NODE DISSECTION;  Surgeon: Jovita Kussmaul, MD;  [facility];  Service: General;  Laterality: Left;  . BREAST EXCISIONAL BIOPSY Left 1995   benign  . CARDIOVASCULAR STRESS TEST  06/21/2017   normal nuclear stress study w/ no ischemia/  normal LV function and wall function, nuclear stress ef 70%  . ELBOW SURGERY Left child  . MASTECTOMY W/ SENTINEL NODE BIOPSY Left 06/13/2018  . MASTECTOMY WITH RADIOACTIVE SEED GUIDED EXCISION AND AXILLARY SENTINEL LYMPH NODE  BIOPSY Bilateral 06/13/2018   Procedure: LEFT MASTECTOMY WITH SENTINEL LYMPH NODE MAPPING AND TARGETED NODE DISSECTION AND RIGHT PROPHYLATIC MASTECTOMY;  Surgeon: Jovita Kussmaul, MD;  Location: [place];  Service: General;  Laterality: Bilateral;  . THYROIDECTOMY Left 10-31-2001   dr gerkin '@[facility]'   . TRANSTHORACIC ECHOCARDIOGRAM  06/21/2017   ef 60-65%/  trivial MR (no evidence mvp)  . TUBAL LIGATION  yrs ago  . VAGINAL HYSTERECTOMY  1990s    There were no vitals filed for this visit.  Subjective Assessment - 08/12/18 1608    Subjective  pt states she has been having increased cording pain in her elbow and almost down to the wrist as well as the upper arm     Pertinent History  Patient was diagnosed on 04/25/18 with left grade II invasive lobular carcinoma breast cancer. There are 2 areas: 2.6 cm in the upper outer quadrant and 6 mm in the upper inner quadrant. Both are ER/PR positive and HER2 negative with a Ki67 of 10% and she has a positive axillary node. Bilateral mastectomy on 06/13/2018 wiht 7 nodes removed and the 10 in second surgery on 07/09/2018  She will have radiation  and goes for her simulation on 08/19/2018    Patient Stated Goals  to get the pain and get stronger and range of motion and find out what my new norm is going  Currently in Pain?  Yes    Pain Score  7     Pain Location  Arm    Pain Orientation  Left    Pain Descriptors / Indicators  [name] Adult PT Treatment/Exercise - 08/12/18 0001      Manual Therapy   Manual Therapy  Edema management;Manual Lymphatic Drainage (MLD)    Edema Management  P    Manual Lymphatic Drainage (MLD)  in supine: short neck, 5 diaphragmatic breaths, left inguinal nodes and establishment of axillo inguinal pathway then spent time on area of swelling in left lateral trunk, then to R sidelying to focus on area just inferior to left axilla where there is increased swelling then back to supine retracing  all steps    Passive ROM  In Supine to Lt shoulder into flexion and abduction               PT Short Term Goals - 07/22/18 1750      PT SHORT TERM GOAL #1   Title  Pt will verbalize lymphedema risk reduction practices     Time  4    Period  Weeks    Status  New      PT SHORT TERM GOAL #2   Title  Pt will have 125 degrees if painless left shoulder abduction so that she can receive radiation     Time  4    Period  Weeks    Status  New      PT SHORT TERM GOAL #3   Title  Pt will be independent in a home exercise program for shoulder range of motion     Time  4    Period  Weeks        PT Long Term Goals - 07/22/18 1751      PT LONG TERM GOAL #1   Title  Patient will demonstrate she has regained shoulder ROM and function post operatively compared to baseline measurements.    Time  6    Period  Weeks    Status  New      PT LONG TERM GOAL #2   Title  Pt will be independent in a home exercise program for strengthening     Time  6    Period  Weeks    Status  New            Plan - 08/12/18 1704    Clinical Impression Statement  Pt has had increas1 ed pain and pulling in left arm, especailly around elbow that even went down into wrist on occasion.  She felt better with small tg soft applied today and manual treatment.   She is anxious about starting her radiation tomorrow     PT Next Visit Plan   Assess goals Review HEP, MLD to left trunk, and stretching to left arm and cording; progress scapular active motion, sign up for ABC class, later progress exercise and teach Strength ABC prior to dischrge        Patient will benefit from skilled therapeutic intervention in order to improve the following deficits and impairments:  Decreased knowledge of precautions, Pain, Impaired UE functional use, Decreased range of motion, Postural dysfunction, Decreased skin integrity, Increased fascial restricitons, Decreased scar mobility, Impaired perceived functional ability,  Decreased strength, Decreased activity tolerance, Increased edema  Visit Diagnosis: Stiffness of left shoulder  joint  Aftercare following surgery for neoplasm  Abnormal posture     Problem List Patient Active Problem List   Diagnosis Date Noted  . Cancer of left female breast  ([place]) 06/13/2018  . Malignant neoplasm of overlapping sites of left breast in female, estrogen receptor positive ([place]) 05/09/2018  . Blurring of visual image of both eyes 12/06/2017  . Postherpetic neuralgia 12/06/2017  . BIPOLAR DISORDER UNSPECIFIED 10/10/2009  . ESOPHAGEAL REFLUX 10/10/2009   Donato Heinz. Owens Shark PT  Norwood Levo 08/12/2018, 5:06 PM  [place], [place], [address] Phone: [phone]   Fax:  [phone]  Name: MAGHEN GROUP MRN: [id number] Date of Birth: [birth year]

## 2018-08-13 ENCOUNTER — Ambulatory Visit
Admission: RE | Admit: 2018-08-13 | Discharge: 2018-08-13 | Disposition: A | Discharge status: Home / Self Care | Payer: 59 | Source: Ambulatory Visit | Attending: Radiation Oncology | Admitting: Radiation Oncology

## 2018-08-13 DIAGNOSIS — Z170 Estrogen receptor positive status [ER+]: Secondary | ICD-10-CM | POA: Diagnosis not present

## 2018-08-13 DIAGNOSIS — Z510 Encounter for antineoplastic radiation therapy: Secondary | ICD-10-CM | POA: Diagnosis not present

## 2018-08-13 DIAGNOSIS — C50812 Malignant neoplasm of overlapping sites of left female breast: Secondary | ICD-10-CM | POA: Diagnosis not present

## 2018-08-14 ENCOUNTER — Ambulatory Visit
Admission: RE | Admit: 2018-08-14 | Discharge: 2018-08-14 | Disposition: A | Discharge status: Home / Self Care | Payer: 59 | Source: Ambulatory Visit | Attending: Radiation Oncology | Admitting: Radiation Oncology

## 2018-08-14 ENCOUNTER — Ambulatory Visit: Payer: 59 | Admitting: Physical Therapy

## 2018-08-14 ENCOUNTER — Encounter: Payer: Self-pay | Admitting: Physical Therapy

## 2018-08-14 DIAGNOSIS — C50812 Malignant neoplasm of overlapping sites of left female breast: Secondary | ICD-10-CM | POA: Diagnosis not present

## 2018-08-14 DIAGNOSIS — M25612 Stiffness of left shoulder, not elsewhere classified: Secondary | ICD-10-CM

## 2018-08-14 DIAGNOSIS — Z483 Aftercare following surgery for neoplasm: Secondary | ICD-10-CM

## 2018-08-14 DIAGNOSIS — Z170 Estrogen receptor positive status [ER+]: Secondary | ICD-10-CM | POA: Diagnosis not present

## 2018-08-14 DIAGNOSIS — R293 Abnormal posture: Secondary | ICD-10-CM

## 2018-08-14 DIAGNOSIS — M6281 Muscle weakness (generalized): Secondary | ICD-10-CM

## 2018-08-14 DIAGNOSIS — Z510 Encounter for antineoplastic radiation therapy: Secondary | ICD-10-CM | POA: Diagnosis not present

## 2018-08-14 NOTE — Therapy (Signed)
[place], [place], [address] Phone: [phone]   Fax:  [phone]  Physical Therapy Treatment  Patient Details  Name: Dawn Booker MRN: [id number] Date of Birth: Jul 29, 1956 Referring Provider (PT): Dr. Autumn Messing   Encounter Date: 08/14/2018  PT End of Session - 08/14/18 1744    Visit Number  8    Number of Visits  14    Date for PT Re-Evaluation  09/02/18    PT Start Time  0623    PT Stop Time  1430    PT Time Calculation (min)  45 min    Activity Tolerance  Patient tolerated treatment well    Behavior During Therapy  [facility] for tasks assessed/performed       Past Medical History:  Diagnosis Date  . ADD (attention deficit disorder)   . Anxiety   . Cancer [facility])    breast cancer  . Depression   . GERD (gastroesophageal reflux disease)   . Hiatal hernia   . Hyperlipemia, mixed   . Intermittent palpitations   . Malignant neoplasm of overlapping sites of left breast in female, estrogen receptor positive ([name]) 05/06/2018   oncologist-  dr Lindi Adie--  Invasive Lobular Cancer, CIS, Stage IB, Grade 2 (pT3,pN1a,cM0), ER+---  s/p  left mastectomy with sln dissections and right mastectomy (benign)  . PONV (postoperative nausea and vomiting)   . Right thyroid nodule   . Shingles 12/2017    Past Surgical History:  Procedure Laterality Date  . AXILLARY LYMPH NODE DISSECTION Left 07/09/2018   Procedure: COMPLETION OF LEFT AXILLARY LYMPH NODE DISSECTION;  Surgeon: Jovita Kussmaul, MD;  [facility];  Service: General;  Laterality: Left;  . BREAST EXCISIONAL BIOPSY Left 1995   benign  . CARDIOVASCULAR STRESS TEST  06/21/2017   normal nuclear stress study w/ no ischemia/  normal LV function and wall function, nuclear stress ef 70%  . ELBOW SURGERY Left child  . MASTECTOMY W/ SENTINEL NODE BIOPSY Left 06/13/2018  . MASTECTOMY WITH RADIOACTIVE SEED GUIDED EXCISION AND AXILLARY SENTINEL LYMPH NODE  BIOPSY Bilateral 06/13/2018   Procedure: LEFT MASTECTOMY WITH SENTINEL LYMPH NODE MAPPING AND TARGETED NODE DISSECTION AND RIGHT PROPHYLATIC MASTECTOMY;  Surgeon: Jovita Kussmaul, MD;  Location: [place];  Service: General;  Laterality: Bilateral;  . THYROIDECTOMY Left 10-31-2001   dr gerkin _0   . TRANSTHORACIC ECHOCARDIOGRAM  06/21/2017   ef 60-65%/  trivial MR (no evidence mvp)  . TUBAL LIGATION  yrs ago  . VAGINAL HYSTERECTOMY  1990s    There were no vitals filed for this visit.  Subjective Assessment - 08/14/18 1359    Subjective  Pt had radiation today.  She found out that her course will be extended 5 treatments as her lung is getting "clipped"  She got some good relief from tg soft but is not wearing it today. she feels the cording down her arm to her elbow     Pertinent History  Patient was diagnosed on 04/25/18 with left grade II invasive lobular carcinoma breast cancer. There are 2 areas: 2.6 cm in the upper outer quadrant and 6 mm in the upper inner quadrant. Both are ER/PR positive and HER2 negative with a Ki67 of 10% and she has a positive axillary node. Bilateral mastectomy on 06/13/2018 wiht 7 nodes removed and the 10 in second surgery on 07/09/2018  She will have radiation  and goes for her simulation on 08/19/2018    Patient Stated Goals  to get the pain and get stronger and range of motion and find out what my new norm is going     Currently in Pain?  Yes    Pain Score  --   did not rate    Pain Location  Arm    Pain Orientation  Left    Pain Descriptors / Indicators  [name] Adult PT Treatment/Exercise - 08/14/18 0001      Exercises   Exercises  Shoulder      Shoulder Exercises: Pulleys   Other Pulley Exercises  attempted pulleys but pt had pain down arm that did not relieve       Shoulder Exercises: Therapy Ball   Flexion  Right;Left;Both    Flexion Limitations  unable to do due to pain in arm       Modalities   Modalities   Moist Heat      Moist Heat Therapy   Number Minutes Moist Heat  5 Minutes    Moist Heat Location  Elbow   left      Manual Therapy   Manual Therapy  Edema management;Myofascial release;Manual Lymphatic Drainage (MLD);Passive ROM    Manual therapy comments  attempted soft tissue stretching to cording with no release     Edema Management  issued small tg soft ,asked pt to get appt at [facility] for sleeve on Tuesday     Manual Lymphatic Drainage (MLD)  in supine: short neck, 5 diaphragmatic breaths, left inguinal nodes and establishment of axillo inguinal pathway then spent time on area of swelling in left lateral trunk, then to R sidelying to focus on area just inferior to left axilla where there is increased swelling then back to supine retracing all steps    Passive ROM  In Supine to Lt shoulder into flexion and abduction               PT Short Term Goals - 07/22/18 1750      PT SHORT TERM GOAL #1   Title  Pt will verbalize lymphedema risk reduction practices     Time  4    Period  Weeks    Status  New      PT SHORT TERM GOAL #2   Title  Pt will have 125 degrees if painless left shoulder abduction so that she can receive radiation     Time  4    Period  Weeks    Status  New      PT SHORT TERM GOAL #3   Title  Pt will be independent in a home exercise program for shoulder range of motion     Time  4    Period  Weeks        PT Long Term Goals - 07/22/18 1751      PT LONG TERM GOAL #1   Title  Patient will demonstrate she has regained shoulder ROM and function post operatively compared to baseline measurements.    Time  6    Period  Weeks    Status  New      PT LONG TERM GOAL #2   Title  Pt will be independent in a home exercise program for strengthening     Time  6    Period  Weeks    Status  New  Plan - 08/14/18 1744    Clinical Impression Statement  Pt continues to have coreding that is visible and painful in left medial elbow.  It  was resistant to treatment to reduce today, but pt felt better after session and wilth mild compression from tg soft.     Rehab Potential  Good    Clinical Impairments Affecting Rehab Potential  17 nodes removed with second surgery needed for lymph node dissection     PT Frequency  2x / week    PT Duration  6 weeks    PT Treatment/Interventions  ADLs/Self Care Home Management;DME Instruction;Therapeutic activities;Therapeutic exercise;Orthotic Fit/Training;Patient/family education;Manual techniques;Neuromuscular re-education;Manual lymph drainage;Compression bandaging;Scar mobilization;Taping;Passive range of motion    PT Next Visit Plan   Assess goals Review HEP, MLD to left trunk, and stretching to left arm and cording; progress scapular active motion, sign up for ABC class, later progress exercise and teach Strength ABC prior to dischrge        Patient will benefit from skilled therapeutic intervention in order to improve the following deficits and impairments:  Decreased knowledge of precautions, Pain, Impaired UE functional use, Decreased range of motion, Postural dysfunction, Decreased skin integrity, Increased fascial restricitons, Decreased scar mobility, Impaired perceived functional ability, Decreased strength, Decreased activity tolerance, Increased edema  Visit Diagnosis: Stiffness of left shoulder joint  Aftercare following surgery for neoplasm  Abnormal posture  Muscle weakness (generalized)     Problem List Patient Active Problem List   Diagnosis Date Noted  . Cancer of left female breast  ([place]) 06/13/2018  . Malignant neoplasm of overlapping sites of left breast in female, estrogen receptor positive ([place]) 05/09/2018  . Blurring of visual image of both eyes 12/06/2017  . Postherpetic neuralgia 12/06/2017  . BIPOLAR DISORDER UNSPECIFIED 10/10/2009  . ESOPHAGEAL REFLUX 10/10/2009  Donato Heinz. Owens Shark PT  Norwood Levo 08/14/2018, [place], [place], [address] Phone: [phone]   Fax:  [phone]  Name: MAYERLI KIRST MRN: [id number] Date of Birth: 04/24/1956

## 2018-08-14 NOTE — Patient Instructions (Signed)
First of all, check with your insurance company to see if provider is in network    [facility] (for wigs and compression sleeves / gloves/gauntlets )  [address] [place], [place] [address] [phone]  Will file some insurances --- call for appointment   [facility] (for mastectomy prosthetics and garments) [address] [place], [place] [address] [phone] Will file some insurances --- call for appointment  [facility]  [address]  [place], [place] [address] [phone] Lower extremity garments  [facility] [address] [place], [place]  [address] [phone]  Cathy Rubel ( Medicaid certified lymphedema fitter) [phone] [email or web address]  Melissa Meares  [facility]  [phone]  [facility] [address] [place], [place] [address] [phone]  Other Resources: National Lymphedema Network:  www.lymphnet.org www.[employer].com for patient articles and self manual lymph drainage information www.lymphedemablog.com has informative articles.  www.compressionguru.com www.lymphedemaproducts.com [email or web address] [email or web address] 

## 2018-08-15 ENCOUNTER — Ambulatory Visit
Admission: RE | Admit: 2018-08-15 | Discharge: 2018-08-15 | Disposition: A | Discharge status: Home / Self Care | Payer: 59 | Source: Ambulatory Visit | Attending: Radiation Oncology | Admitting: Radiation Oncology

## 2018-08-15 DIAGNOSIS — C50812 Malignant neoplasm of overlapping sites of left female breast: Secondary | ICD-10-CM | POA: Diagnosis not present

## 2018-08-15 DIAGNOSIS — Z170 Estrogen receptor positive status [ER+]: Secondary | ICD-10-CM | POA: Diagnosis not present

## 2018-08-15 DIAGNOSIS — Z510 Encounter for antineoplastic radiation therapy: Secondary | ICD-10-CM | POA: Diagnosis not present

## 2018-08-18 ENCOUNTER — Ambulatory Visit
Admission: RE | Admit: 2018-08-18 | Discharge: 2018-08-18 | Disposition: A | Discharge status: Home / Self Care | Payer: 59 | Source: Ambulatory Visit | Attending: Radiation Oncology | Admitting: Radiation Oncology

## 2018-08-18 DIAGNOSIS — Z170 Estrogen receptor positive status [ER+]: Secondary | ICD-10-CM

## 2018-08-18 DIAGNOSIS — C50812 Malignant neoplasm of overlapping sites of left female breast: Secondary | ICD-10-CM | POA: Diagnosis not present

## 2018-08-18 DIAGNOSIS — Z510 Encounter for antineoplastic radiation therapy: Secondary | ICD-10-CM | POA: Diagnosis not present

## 2018-08-18 MED ORDER — ALRA NON-METALLIC DEODORANT (RAD-ONC)
1.0000 "application " | Freq: Once | TOPICAL | Status: AC
  Administered 2018-08-18: 1 via TOPICAL

## 2018-08-18 MED ORDER — SONAFINE EX EMUL
1.0000 "application " | Freq: Once | CUTANEOUS | Status: AC
  Administered 2018-08-18: 1 via TOPICAL

## 2018-08-18 NOTE — Progress Notes (Signed)

## 2018-08-19 ENCOUNTER — Ambulatory Visit
Admission: RE | Admit: 2018-08-19 | Discharge: 2018-08-19 | Disposition: A | Discharge status: Home / Self Care | Payer: 59 | Source: Ambulatory Visit | Attending: Radiation Oncology | Admitting: Radiation Oncology

## 2018-08-19 ENCOUNTER — Ambulatory Visit: Payer: 59 | Admitting: Physical Therapy

## 2018-08-19 ENCOUNTER — Ambulatory Visit: Payer: 59 | Admitting: Radiation Oncology

## 2018-08-19 ENCOUNTER — Ambulatory Visit: Payer: 59

## 2018-08-19 ENCOUNTER — Encounter: Payer: Self-pay | Admitting: Physical Therapy

## 2018-08-19 DIAGNOSIS — C50812 Malignant neoplasm of overlapping sites of left female breast: Secondary | ICD-10-CM | POA: Diagnosis not present

## 2018-08-19 DIAGNOSIS — Z483 Aftercare following surgery for neoplasm: Secondary | ICD-10-CM

## 2018-08-19 DIAGNOSIS — M6281 Muscle weakness (generalized): Secondary | ICD-10-CM

## 2018-08-19 DIAGNOSIS — Z510 Encounter for antineoplastic radiation therapy: Secondary | ICD-10-CM | POA: Diagnosis not present

## 2018-08-19 DIAGNOSIS — Z170 Estrogen receptor positive status [ER+]: Secondary | ICD-10-CM | POA: Diagnosis not present

## 2018-08-19 DIAGNOSIS — M25612 Stiffness of left shoulder, not elsewhere classified: Secondary | ICD-10-CM

## 2018-08-19 DIAGNOSIS — R293 Abnormal posture: Secondary | ICD-10-CM

## 2018-08-19 NOTE — Therapy (Signed)
[place], [place], [address] Phone: [phone]   Fax:  [phone]  Physical Therapy Treatment  Patient Details  Name: Dawn Booker MRN: [id number] Date of Birth: 10-02-1955 Referring Provider (PT): Dr. Autumn Messing   Encounter Date: 08/19/2018  PT End of Session - 08/19/18 1732    Visit Number  9    Number of Visits  14    Date for PT Re-Evaluation  09/02/18    PT Start Time  3893    PT Stop Time  1430    PT Time Calculation (min)  45 min    Activity Tolerance  Patient tolerated treatment well    Behavior During Therapy  [facility] for tasks assessed/performed       Past Medical History:  Diagnosis Date  . ADD (attention deficit disorder)   . Anxiety   . Cancer [facility])    breast cancer  . Depression   . GERD (gastroesophageal reflux disease)   . Hiatal hernia   . Hyperlipemia, mixed   . Intermittent palpitations   . Malignant neoplasm of overlapping sites of left breast in female, estrogen receptor positive ([place]) 05/06/2018   oncologist-  dr Lindi Adie--  Invasive Lobular Cancer, CIS, Stage IB, Grade 2 (pT3,pN1a,cM0), ER+---  s/p  left mastectomy with sln dissections and right mastectomy (benign)  . PONV (postoperative nausea and vomiting)   . Right thyroid nodule   . Shingles 12/2017    Past Surgical History:  Procedure Laterality Date  . AXILLARY LYMPH NODE DISSECTION Left 07/09/2018   Procedure: COMPLETION OF LEFT AXILLARY LYMPH NODE DISSECTION;  Surgeon: Jovita Kussmaul, MD;  Location: [facility];  Service: General;  Laterality: Left;  . BREAST EXCISIONAL BIOPSY Left 1995   benign  . CARDIOVASCULAR STRESS TEST  06/21/2017   normal nuclear stress study w/ no ischemia/  normal LV function and wall function, nuclear stress ef 70%  . ELBOW SURGERY Left child  . MASTECTOMY W/ SENTINEL NODE BIOPSY Left 06/13/2018  . MASTECTOMY WITH RADIOACTIVE SEED GUIDED EXCISION AND AXILLARY SENTINEL LYMPH NODE  BIOPSY Bilateral 06/13/2018   Procedure: LEFT MASTECTOMY WITH SENTINEL LYMPH NODE MAPPING AND TARGETED NODE DISSECTION AND RIGHT PROPHYLATIC MASTECTOMY;  Surgeon: Jovita Kussmaul, MD;  Location: [place];  Service: General;  Laterality: Bilateral;  . THYROIDECTOMY Left 10-31-2001   dr gerkin '@[facility]'   . TRANSTHORACIC ECHOCARDIOGRAM  06/21/2017   ef 60-65%/  trivial MR (no evidence mvp)  . TUBAL LIGATION  yrs ago  . VAGINAL HYSTERECTOMY  1990s    There were no vitals filed for this visit.  Subjective Assessment - 08/19/18 1359    Subjective  Pt had onset of left shoulder and neck pain after exercising and using her arms wrapping presents  Cording in left arm is unchanged, She had her sleeve and gauntlet today measured today but prescription has not been received yet.     Pertinent History  Patient was diagnosed on 04/25/18 with left grade II invasive lobular carcinoma breast cancer. There are 2 areas: 2.6 cm in the upper outer quadrant and 6 mm in the upper inner quadrant. Both are ER/PR positive and HER2 negative with a Ki67 of 10% and she has a positive axillary node. Bilateral mastectomy on 06/13/2018 wiht 7 nodes removed and the 10 in second surgery on 07/09/2018  She will have radiation  and goes for her simulation on 08/19/2018    Patient Stated Goals  to get the pain and get stronger  and range of motion and find out what my new norm is going     Currently in Pain?  Yes    Pain Score  6     Pain Location  Shoulder    Pain Orientation  Left                       [facility] Adult PT Treatment/Exercise - 08/19/18 0001      Manual Therapy   Manual Therapy  Soft tissue mobilization;Scapular mobilization;Manual Lymphatic Drainage (MLD);Passive ROM    Soft tissue mobilization  with thick massage cream and pt in right sidelying, began soft tissue work to left upper traps and posterior shoulder to very tender tight muscles that seemed to relax with manual work.  Pt also had tight trigger point  at posterior axillary area.  Treatment stopped as pt remembered she could not have lotion to area before radiation     Manual Lymphatic Drainage (MLD)  briefly to left arm especailly at eblow     Passive ROM  In Supine to Lt shoulder into flexion and abduction               PT Short Term Goals - 07/22/18 1750      PT SHORT TERM GOAL #1   Title  Pt will verbalize lymphedema risk reduction practices     Time  4    Period  Weeks    Status  New      PT SHORT TERM GOAL #2   Title  Pt will have 125 degrees if painless left shoulder abduction so that she can receive radiation     Time  4    Period  Weeks    Status  New      PT SHORT TERM GOAL #3   Title  Pt will be independent in a home exercise program for shoulder range of motion     Time  4    Period  Weeks        PT Long Term Goals - 07/22/18 1751      PT LONG TERM GOAL #1   Title  Patient will demonstrate she has regained shoulder ROM and function post operatively compared to baseline measurements.    Time  6    Period  Weeks    Status  New      PT LONG TERM GOAL #2   Title  Pt will be independent in a home exercise program for strengthening     Time  6    Period  Weeks    Status  New            Plan - 08/19/18 1733    Clinical Impression Statement  Pt with sudden onset of very tight left upper trap and posterior shoulder with pain after exercise and working at home last saturday.  Some relief with soft tissue work.  Pt is to start back to work on Thrusday     Clinical Impairments Affecting Rehab Potential  17 nodes removed with second surgery needed for lymph node dissection     PT Frequency  2x / week    PT Duration  6 weeks    PT Treatment/Interventions  ADLs/Self Care Home Management;DME Instruction;Therapeutic activities;Therapeutic exercise;Orthotic Fit/Training;Patient/family education;Manual techniques;Neuromuscular re-education;Manual lymph drainage;Compression bandaging;Scar  mobilization;Taping;Passive range of motion    PT Next Visit Plan   Assess goals Review HEP, MLD to left trunk, and stretching to left arm and cording; progress scapular  active motion, sign up for ABC class, later progress exercise and teach Strength ABC prior to dischrge     PT Home Exercise Plan  Post op shoulder ROM HEP    Consulted and Agree with Plan of Care  Patient       Patient will benefit from skilled therapeutic intervention in order to improve the following deficits and impairments:  Decreased knowledge of precautions, Pain, Impaired UE functional use, Decreased range of motion, Postural dysfunction, Decreased skin integrity, Increased fascial restricitons, Decreased scar mobility, Impaired perceived functional ability, Decreased strength, Decreased activity tolerance, Increased edema  Visit Diagnosis: Stiffness of left shoulder joint  Aftercare following surgery for neoplasm  Abnormal posture  Muscle weakness (generalized)     Problem List Patient Active Problem List   Diagnosis Date Noted  . Cancer of left female breast  ([place]) 06/13/2018  . Malignant neoplasm of overlapping sites of left breast in female, estrogen receptor positive ([place]) 05/09/2018  . Blurring of visual image of both eyes 12/06/2017  . Postherpetic neuralgia 12/06/2017  . BIPOLAR DISORDER UNSPECIFIED 10/10/2009  . ESOPHAGEAL REFLUX 10/10/2009   Donato Heinz. Owens Shark PT  Norwood Levo 08/19/2018, 5:35 PM  [place], [place], [address] Phone: [phone]   Fax:  [phone]  Name: AVERLEIGH SAVARY MRN: [id number] Date of Birth: Oct 01, 1955

## 2018-08-20 ENCOUNTER — Ambulatory Visit
Admission: RE | Admit: 2018-08-20 | Discharge: 2018-08-20 | Disposition: A | Discharge status: Home / Self Care | Payer: 59 | Source: Ambulatory Visit | Attending: Radiation Oncology | Admitting: Radiation Oncology

## 2018-08-20 DIAGNOSIS — C50812 Malignant neoplasm of overlapping sites of left female breast: Secondary | ICD-10-CM | POA: Diagnosis not present

## 2018-08-20 DIAGNOSIS — Z510 Encounter for antineoplastic radiation therapy: Secondary | ICD-10-CM | POA: Diagnosis not present

## 2018-08-20 DIAGNOSIS — Z170 Estrogen receptor positive status [ER+]: Secondary | ICD-10-CM | POA: Diagnosis not present

## 2018-08-21 ENCOUNTER — Ambulatory Visit
Admission: RE | Admit: 2018-08-21 | Discharge: 2018-08-21 | Disposition: A | Discharge status: Home / Self Care | Payer: 59 | Source: Ambulatory Visit | Attending: Radiation Oncology | Admitting: Radiation Oncology

## 2018-08-21 ENCOUNTER — Ambulatory Visit: Payer: 59 | Admitting: Physical Therapy

## 2018-08-21 ENCOUNTER — Encounter: Payer: Self-pay | Admitting: Physical Therapy

## 2018-08-21 DIAGNOSIS — Z510 Encounter for antineoplastic radiation therapy: Secondary | ICD-10-CM | POA: Diagnosis not present

## 2018-08-21 DIAGNOSIS — C50812 Malignant neoplasm of overlapping sites of left female breast: Secondary | ICD-10-CM | POA: Diagnosis not present

## 2018-08-21 DIAGNOSIS — Z170 Estrogen receptor positive status [ER+]: Secondary | ICD-10-CM | POA: Diagnosis not present

## 2018-08-21 DIAGNOSIS — M25612 Stiffness of left shoulder, not elsewhere classified: Secondary | ICD-10-CM

## 2018-08-21 DIAGNOSIS — R293 Abnormal posture: Secondary | ICD-10-CM

## 2018-08-21 DIAGNOSIS — Z483 Aftercare following surgery for neoplasm: Secondary | ICD-10-CM

## 2018-08-21 DIAGNOSIS — M6281 Muscle weakness (generalized): Secondary | ICD-10-CM

## 2018-08-21 NOTE — Patient Instructions (Signed)
Lie on your side, hand on your hip, thumb up Imagine your facing a plate of glass and raise your arm up over your head to the limit. Take a deep breath   Next hands together, raise the top arm up and back. Follow your hand with your eyes to the limit Take a breath, then reach back

## 2018-08-21 NOTE — Therapy (Signed)
[place], [place], [address] Phone: [phone]   Fax:  [phone]  Physical Therapy Treatment  Patient Details  Name: Dawn Booker MRN: [id number] Date of Birth: 15-Nov-1955 Referring Provider (PT): Dr. Autumn Messing   Encounter Date: 08/21/2018  PT End of Session - 08/21/18 1659    Visit Number  10    Number of Visits  14    Date for PT Re-Evaluation  09/02/18    PT Start Time  1600    PT Stop Time  1645    PT Time Calculation (min)  45 min    Activity Tolerance  Patient tolerated treatment well    Behavior During Therapy  [facility] for tasks assessed/performed       Past Medical History:  Diagnosis Date  . ADD (attention deficit disorder)   . Anxiety   . Cancer [facility])    breast cancer  . Depression   . GERD (gastroesophageal reflux disease)   . Hiatal hernia   . Hyperlipemia, mixed   . Intermittent palpitations   . Malignant neoplasm of overlapping sites of left breast in female, estrogen receptor positive ([place]) 05/06/2018   oncologist-  dr Lindi Adie--  Invasive Lobular Cancer, CIS, Stage IB, Grade 2 (pT3,pN1a,cM0), ER+---  s/p  left mastectomy with sln dissections and right mastectomy (benign)  . PONV (postoperative nausea and vomiting)   . Right thyroid nodule   . Shingles 12/2017    Past Surgical History:  Procedure Laterality Date  . AXILLARY LYMPH NODE DISSECTION Left 07/09/2018   Procedure: COMPLETION OF LEFT AXILLARY LYMPH NODE DISSECTION;  Surgeon: Jovita Kussmaul, MD;  [facility];  Service: General;  Laterality: Left;  . BREAST EXCISIONAL BIOPSY Left 1995   benign  . CARDIOVASCULAR STRESS TEST  06/21/2017   normal nuclear stress study w/ no ischemia/  normal LV function and wall function, nuclear stress ef 70%  . ELBOW SURGERY Left child  . MASTECTOMY W/ SENTINEL NODE BIOPSY Left 06/13/2018  . MASTECTOMY WITH RADIOACTIVE SEED GUIDED EXCISION AND AXILLARY SENTINEL LYMPH NODE  BIOPSY Bilateral 06/13/2018   Procedure: LEFT MASTECTOMY WITH SENTINEL LYMPH NODE MAPPING AND TARGETED NODE DISSECTION AND RIGHT PROPHYLATIC MASTECTOMY;  Surgeon: Jovita Kussmaul, MD;  Location: [place];  Service: General;  Laterality: Bilateral;  . THYROIDECTOMY Left 10-31-2001   dr gerkin '@[facility]'   . TRANSTHORACIC ECHOCARDIOGRAM  06/21/2017   ef 60-65%/  trivial MR (no evidence mvp)  . TUBAL LIGATION  yrs ago  . VAGINAL HYSTERECTOMY  1990s    There were no vitals filed for this visit.  Subjective Assessment - 08/21/18 1615    Subjective  Pt states she is still having pain in her left shoulder and is having cording that is not going down into her wrist     Pertinent History  Patient was diagnosed on 04/25/18 with left grade II invasive lobular carcinoma breast cancer. There are 2 areas: 2.6 cm in the upper outer quadrant and 6 mm in the upper inner quadrant. Both are ER/PR positive and HER2 negative with a Ki67 of 10% and she has a positive axillary node. Bilateral mastectomy on 06/13/2018 wiht 7 nodes removed and the 10 in second surgery on 07/09/2018  She will have radiation  and goes for her simulation on 08/19/2018    Patient Stated Goals  to get the pain and get stronger and range of motion and find out what my new norm is going  Currently in Pain?  Yes                       [place] Adult PT Treatment/Exercise - 08/21/18 0001      Exercises   Exercises  Shoulder      Shoulder Exercises: Sidelying   ABduction  AROM;Left;10 reps    Other Sidelying Exercises  open book stretch to end of comfort zone and deep breath, relax       Manual Therapy   Manual Therapy  Soft tissue mobilization;Scapular mobilization;Manual Traction    Soft tissue mobilization  with thick massage cream and pt in right sidelying, began soft tissue work to left upper traps and posterior shoulder to very tender tight muscles that seemed to relax with manual work.  Pt also had tight trigger point at  posterior axillary area.      Scapular Mobilization  inferior glide to left scapula for neck stretch     Passive ROM  In Supine to Lt shoulder into flexion and abduction    Manual Traction  gentle cervical traction with myofascial release                PT Short Term Goals - 08/21/18 1701      PT SHORT TERM GOAL #1   Title  Pt will verbalize lymphedema risk reduction practices     Time  4    Status  On-going      PT SHORT TERM GOAL #2   Title  Pt will have 125 degrees if painless left shoulder abduction so that she can receive radiation     Time  4    Period  Weeks    Status  On-going      PT SHORT TERM GOAL #3   Title  Pt will be independent in a home exercise program for shoulder range of motion     Period  Weeks    Status  Achieved        PT Long Term Goals - 08/21/18 1701      PT LONG TERM GOAL #1   Title  Patient will demonstrate she has regained shoulder ROM and function post operatively compared to baseline measurements.    Time  6    Period  Weeks    Status  On-going      PT LONG TERM GOAL #2   Title  Pt will be independent in a home exercise program for strengthening     Period  Weeks    Status  On-going            Plan - 08/21/18 1659    Clinical Impression Statement  Pt continues to have neck and shoulder pain.  Received relief with soft tissue work to neck and posterior shoulder and serratus anterior area.  Added sidelying stretches.  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     Clinical Impairments Affecting Rehab Potential  17 nodes removed with second surgery needed for lymph node dissection     PT Treatment/Interventions  ADLs/Self Care Home Management;DME Instruction;Therapeutic activities;Therapeutic exercise;Orthotic Fit/Training;Patient/family education;Manual techniques;Neuromuscular re-education;Manual lymph drainage;Compression bandaging;Scar  mobilization;Taping;Passive range of motion    PT Next Visit Plan   Assess goals Review HEP, MLD to left trunk, and stretching to left arm and cording; progress scapular active motion, sign up for ABC class, later progress exercise and teach Strength ABC prior to dischrge  Patient will benefit from skilled therapeutic intervention in order to improve the following deficits and impairments:     Visit Diagnosis: Stiffness of left shoulder joint  Aftercare following surgery for neoplasm  Abnormal posture  Muscle weakness (generalized)     Problem List Patient Active Problem List   Diagnosis Date Noted  . Cancer of left female breast  ([place]) 06/13/2018  . Malignant neoplasm of overlapping sites of left breast in female, estrogen receptor positive ([place]) 05/09/2018  . Blurring of visual image of both eyes 12/06/2017  . Postherpetic neuralgia 12/06/2017  . BIPOLAR DISORDER UNSPECIFIED 10/10/2009  . ESOPHAGEAL REFLUX 10/10/2009   Donato Heinz. Owens Shark PT  Norwood Levo 08/21/2018, 5:03 PM  [place], [place], [address] Phone: [phone]   Fax:  [phone]  Name: Dawn Booker MRN: [id number] Date of Birth: 08-Jul-1956

## 2018-08-22 ENCOUNTER — Ambulatory Visit
Admission: RE | Admit: 2018-08-22 | Discharge: 2018-08-22 | Disposition: A | Discharge status: Home / Self Care | Payer: 59 | Source: Ambulatory Visit | Attending: Radiation Oncology | Admitting: Radiation Oncology

## 2018-08-22 DIAGNOSIS — Z510 Encounter for antineoplastic radiation therapy: Secondary | ICD-10-CM | POA: Diagnosis not present

## 2018-08-22 DIAGNOSIS — C50812 Malignant neoplasm of overlapping sites of left female breast: Secondary | ICD-10-CM | POA: Diagnosis not present

## 2018-08-22 DIAGNOSIS — Z170 Estrogen receptor positive status [ER+]: Secondary | ICD-10-CM | POA: Diagnosis not present

## 2018-08-25 ENCOUNTER — Ambulatory Visit
Admission: RE | Admit: 2018-08-25 | Discharge: 2018-08-25 | Disposition: A | Discharge status: Home / Self Care | Payer: 59 | Source: Ambulatory Visit | Attending: Radiation Oncology | Admitting: Radiation Oncology

## 2018-08-25 ENCOUNTER — Encounter: Payer: 59 | Admitting: Physical Therapy

## 2018-08-25 DIAGNOSIS — Z510 Encounter for antineoplastic radiation therapy: Secondary | ICD-10-CM | POA: Diagnosis not present

## 2018-08-25 DIAGNOSIS — Z170 Estrogen receptor positive status [ER+]: Secondary | ICD-10-CM

## 2018-08-25 DIAGNOSIS — C50812 Malignant neoplasm of overlapping sites of left female breast: Secondary | ICD-10-CM | POA: Diagnosis not present

## 2018-08-25 MED ORDER — SONAFINE EX EMUL
1.0000 "application " | Freq: Two times a day (BID) | CUTANEOUS | Status: DC
  Administered 2018-08-25: 1 via TOPICAL

## 2018-08-26 ENCOUNTER — Ambulatory Visit
Admission: RE | Admit: 2018-08-26 | Discharge: 2018-08-26 | Disposition: A | Discharge status: Home / Self Care | Payer: 59 | Source: Ambulatory Visit | Attending: Radiation Oncology | Admitting: Radiation Oncology

## 2018-08-26 DIAGNOSIS — C50812 Malignant neoplasm of overlapping sites of left female breast: Secondary | ICD-10-CM | POA: Diagnosis not present

## 2018-08-26 DIAGNOSIS — Z170 Estrogen receptor positive status [ER+]: Secondary | ICD-10-CM | POA: Diagnosis not present

## 2018-08-26 DIAGNOSIS — Z510 Encounter for antineoplastic radiation therapy: Secondary | ICD-10-CM | POA: Diagnosis not present

## 2018-08-28 ENCOUNTER — Encounter: Payer: Self-pay | Admitting: Rehabilitation

## 2018-08-28 ENCOUNTER — Ambulatory Visit
Admission: RE | Admit: 2018-08-28 | Discharge: 2018-08-28 | Disposition: A | Discharge status: Home / Self Care | Payer: 59 | Source: Ambulatory Visit | Attending: Radiation Oncology | Admitting: Radiation Oncology

## 2018-08-28 ENCOUNTER — Ambulatory Visit: Payer: 59 | Admitting: Rehabilitation

## 2018-08-28 DIAGNOSIS — Z170 Estrogen receptor positive status [ER+]: Secondary | ICD-10-CM

## 2018-08-28 DIAGNOSIS — M25612 Stiffness of left shoulder, not elsewhere classified: Secondary | ICD-10-CM

## 2018-08-28 DIAGNOSIS — Z510 Encounter for antineoplastic radiation therapy: Secondary | ICD-10-CM | POA: Diagnosis not present

## 2018-08-28 DIAGNOSIS — Z483 Aftercare following surgery for neoplasm: Secondary | ICD-10-CM

## 2018-08-28 DIAGNOSIS — M6281 Muscle weakness (generalized): Secondary | ICD-10-CM

## 2018-08-28 DIAGNOSIS — R293 Abnormal posture: Secondary | ICD-10-CM

## 2018-08-28 DIAGNOSIS — C50812 Malignant neoplasm of overlapping sites of left female breast: Secondary | ICD-10-CM

## 2018-08-28 MED FILL — GABAPENTIN 600 MG TABLET: 600 | 30 days supply | Qty: 90 | Fill #1

## 2018-08-28 NOTE — Patient Instructions (Signed)
Doorway stretch both arms, self trigger point release with your right fingers holding until you feel the muscle release, holding good posture at work.  Good posture!  Keep your chin down and shoulders back

## 2018-08-28 NOTE — Therapy (Addendum)
[place], [place], [address] Phone: [phone]   Fax:  [phone]  Physical Therapy Treatment  Patient Details  Name: Dawn Booker MRN: [id number] Date of Birth: Dec 20, 1955 Referring Provider (PT): Dr. Autumn Messing   Encounter Date: 08/28/2018  PT End of Session - 08/28/18 1705    Visit Number  11    Number of Visits  14    Date for PT Re-Evaluation  09/02/18    PT Start Time  1604    PT Stop Time  1650    PT Time Calculation (min)  46 min    Activity Tolerance  Patient tolerated treatment well    Behavior During Therapy  [facility] for tasks assessed/performed       Past Medical History:  Diagnosis Date  . ADD (attention deficit disorder)   . Anxiety   . Cancer [facility])    breast cancer  . Depression   . GERD (gastroesophageal reflux disease)   . Hiatal hernia   . Hyperlipemia, mixed   . Intermittent palpitations   . Malignant neoplasm of overlapping sites of left breast in female, estrogen receptor positive ([place]) 05/06/2018   oncologist-  dr Lindi Adie--  Invasive Lobular Cancer, CIS, Stage IB, Grade 2 (pT3,pN1a,cM0), ER+---  s/p  left mastectomy with sln dissections and right mastectomy (benign)  . PONV (postoperative nausea and vomiting)   . Right thyroid nodule   . Shingles 12/2017    Past Surgical History:  Procedure Laterality Date  . AXILLARY LYMPH NODE DISSECTION Left 07/09/2018   Procedure: COMPLETION OF LEFT AXILLARY LYMPH NODE DISSECTION;  Surgeon: Jovita Kussmaul, MD;  [facility];  Service: General;  Laterality: Left;  . BREAST EXCISIONAL BIOPSY Left 1995   benign  . CARDIOVASCULAR STRESS TEST  06/21/2017   normal nuclear stress study w/ no ischemia/  normal LV function and wall function, nuclear stress ef 70%  . ELBOW SURGERY Left child  . MASTECTOMY W/ SENTINEL NODE BIOPSY Left 06/13/2018  . MASTECTOMY WITH RADIOACTIVE SEED GUIDED EXCISION AND AXILLARY SENTINEL LYMPH NODE  BIOPSY Bilateral 06/13/2018   Procedure: LEFT MASTECTOMY WITH SENTINEL LYMPH NODE MAPPING AND TARGETED NODE DISSECTION AND RIGHT PROPHYLATIC MASTECTOMY;  Surgeon: Jovita Kussmaul, MD;  Location: [place];  Service: General;  Laterality: Bilateral;  . THYROIDECTOMY Left 10-31-2001   dr gerkin '@[facility]'   . TRANSTHORACIC ECHOCARDIOGRAM  06/21/2017   ef 60-65%/  trivial MR (no evidence mvp)  . TUBAL LIGATION  yrs ago  . VAGINAL HYSTERECTOMY  1990s    There were no vitals filed for this visit.  Subjective Assessment - 08/28/18 1700    Subjective  My shoulder felt really good after last visit and then it came back. This has to be my last day until after radiation due to schedule, copay, and skin starting to get red     Patient is accompained by:  Family member    Pertinent History  Patient was diagnosed on 04/25/18 with left grade II invasive lobular carcinoma breast cancer. There are 2 areas: 2.6 cm in the upper outer quadrant and 6 mm in the upper inner quadrant. Both are ER/PR positive and HER2 negative with a Ki67 of 10% and she has a positive axillary node. Bilateral mastectomy on 06/13/2018 wiht 7 nodes removed and the 10 in second surgery on 07/09/2018  She will have radiation  and goes for her simulation on 08/19/2018    Patient Stated Goals  to get the pain  and get stronger and range of motion and find out what my new norm is going     Currently in Pain?  Yes    Pain Score  3     Pain Location  Shoulder    Pain Orientation  Left    Pain Descriptors / Indicators  Sharp;Aching    Pain Onset  More than a month ago    Pain Frequency  Intermittent         [facility] PT Assessment - 08/28/18 0001      AROM   Overall AROM Comments  cervical AROM WNL with pull into the Lt levator scapula with rotation Right     Right Shoulder Flexion  160 Degrees    Right Shoulder ABduction  155 Degrees    Left Shoulder Flexion  155 Degrees    Left Shoulder ABduction  155 Degrees    Left Shoulder External Rotation  85  Degrees                   [facility] Adult PT Treatment/Exercise - 08/28/18 0001      Exercises   Other Exercises   showed pt doorway stretch with both arms instead of single arm.  reviewed correct working posture focusing on chin down and shoulders back due to location of trigger points and referred pain      Manual Therapy   Soft tissue mobilization  in supine; suboccipital realease, manual stretching into Rt sidebending and levator scapulae 2x30" each, Rt sidelying to Lt UT, latissimus, serratus, levator, and thoracic paraspinals with trigger point release to the levator x3 with referred pain into the chin and jaw.  Taught pt self release    Scapular Mobilization  inferior glide to left scapula for neck stretch     Passive ROM  In Supine to Lt shoulder into flexion and abduction               PT Short Term Goals - 08/21/18 1701      PT SHORT TERM GOAL #1   Title  Pt will verbalize lymphedema risk reduction practices     Time  4    Status  On-going      PT SHORT TERM GOAL #2   Title  Pt will have 125 degrees if painless left shoulder abduction so that she can receive radiation     Time  4    Period  Weeks    Status  On-going      PT SHORT TERM GOAL #3   Title  Pt will be independent in a home exercise program for shoulder range of motion     Period  Weeks    Status  Achieved        PT Long Term Goals - 08/28/18 1708      PT LONG TERM GOAL #1   Title  Patient will demonstrate she has regained shoulder ROM and function post operatively compared to baseline measurements.    Status  Achieved      PT LONG TERM GOAL #2   Title  Pt will be independent in a home exercise program for strengthening     Status  Not Met            Plan - 08/28/18 1706    Clinical Impression Statement  Pt continues with neck and shoulder pain.  AROM has improved greatly since evaluation.  Most pain is located at the levator scapula with active trigger point most likely due to  return to work poor posture and/or having to have the head turned during radiation.  pt requested today be the last day due to payments in the new year and that she would most likely return when her radiation is over for a check up.  Also good for pt to take a break due to radiation redness beginning.      Clinical Impairments Affecting Rehab Potential  17 nodes removed with second surgery needed for lymph node dissection     PT Frequency  2x / week    PT Duration  6 weeks    PT Treatment/Interventions  ADLs/Self Care Home Management;DME Instruction;Therapeutic activities;Therapeutic exercise;Orthotic Fit/Training;Patient/family education;Manual techniques;Neuromuscular re-education;Manual lymph drainage;Compression bandaging;Scar mobilization;Taping;Passive range of motion       Patient will benefit from skilled therapeutic intervention in order to improve the following deficits and impairments:  Decreased knowledge of precautions, Pain, Impaired UE functional use, Decreased range of motion, Postural dysfunction, Decreased skin integrity, Increased fascial restricitons, Decreased scar mobility, Impaired perceived functional ability, Decreased strength, Decreased activity tolerance, Increased edema  Visit Diagnosis: Stiffness of left shoulder joint  Abnormal posture  Aftercare following surgery for neoplasm  Muscle weakness (generalized)  Malignant neoplasm of overlapping sites of left breast in female, estrogen receptor positive ([place])     Problem List Patient Active Problem List   Diagnosis Date Noted  . Cancer of left female breast  ([place]) 06/13/2018  . Malignant neoplasm of overlapping sites of left breast in female, estrogen receptor positive ([place]) 05/09/2018  . Blurring of visual image of both eyes 12/06/2017  . Postherpetic neuralgia 12/06/2017  . BIPOLAR DISORDER UNSPECIFIED 10/10/2009  . ESOPHAGEAL REFLUX 10/10/2009     PHYSICAL THERAPY DISCHARGE SUMMARY  Visits from Start  of Care: 11  Current functional level related to goals / functional outcomes: See above   Remaining deficits: Continued neck and shoulder pain needing to break due to radiation   Education / Equipment: Self stretches and HEP Plan: Patient agrees to discharge.  Patient goals were partially met. Patient is being discharged due to the patient's request.  ?????      Shan Levans, PT  09/11/18

## 2018-08-29 ENCOUNTER — Ambulatory Visit
Admission: RE | Admit: 2018-08-29 | Discharge: 2018-08-29 | Disposition: A | Discharge status: Home / Self Care | Payer: 59 | Source: Ambulatory Visit | Attending: Radiation Oncology | Admitting: Radiation Oncology

## 2018-08-29 DIAGNOSIS — Z510 Encounter for antineoplastic radiation therapy: Secondary | ICD-10-CM | POA: Diagnosis not present

## 2018-08-29 DIAGNOSIS — Z170 Estrogen receptor positive status [ER+]: Secondary | ICD-10-CM | POA: Diagnosis not present

## 2018-08-29 DIAGNOSIS — C50812 Malignant neoplasm of overlapping sites of left female breast: Secondary | ICD-10-CM | POA: Diagnosis not present

## 2018-08-29 MED FILL — TEMAZEPAM 30 MG CAPSULE: 30 | 30 days supply | Qty: 30 | Fill #0

## 2018-09-01 ENCOUNTER — Ambulatory Visit
Admission: RE | Admit: 2018-09-01 | Discharge: 2018-09-01 | Disposition: A | Discharge status: Home / Self Care | Payer: 59 | Source: Ambulatory Visit | Attending: Radiation Oncology | Admitting: Radiation Oncology

## 2018-09-01 ENCOUNTER — Encounter: Payer: 59 | Admitting: Rehabilitation

## 2018-09-01 DIAGNOSIS — Z170 Estrogen receptor positive status [ER+]: Secondary | ICD-10-CM | POA: Diagnosis not present

## 2018-09-01 DIAGNOSIS — Z510 Encounter for antineoplastic radiation therapy: Secondary | ICD-10-CM | POA: Diagnosis not present

## 2018-09-01 DIAGNOSIS — C50812 Malignant neoplasm of overlapping sites of left female breast: Secondary | ICD-10-CM | POA: Diagnosis not present

## 2018-09-02 ENCOUNTER — Ambulatory Visit
Admission: RE | Admit: 2018-09-02 | Discharge: 2018-09-02 | Disposition: A | Discharge status: Home / Self Care | Payer: 59 | Source: Ambulatory Visit | Attending: Radiation Oncology | Admitting: Radiation Oncology

## 2018-09-02 DIAGNOSIS — Z510 Encounter for antineoplastic radiation therapy: Secondary | ICD-10-CM | POA: Diagnosis not present

## 2018-09-02 DIAGNOSIS — C50812 Malignant neoplasm of overlapping sites of left female breast: Secondary | ICD-10-CM | POA: Diagnosis not present

## 2018-09-02 DIAGNOSIS — Z170 Estrogen receptor positive status [ER+]: Secondary | ICD-10-CM | POA: Diagnosis not present

## 2018-09-04 ENCOUNTER — Encounter: Payer: 59 | Admitting: Physical Therapy

## 2018-09-04 ENCOUNTER — Ambulatory Visit
Admission: RE | Admit: 2018-09-04 | Discharge: 2018-09-04 | Disposition: A | Discharge status: Home / Self Care | Payer: 59 | Source: Ambulatory Visit | Attending: Radiation Oncology | Admitting: Radiation Oncology

## 2018-09-04 DIAGNOSIS — Z170 Estrogen receptor positive status [ER+]: Secondary | ICD-10-CM | POA: Insufficient documentation

## 2018-09-04 DIAGNOSIS — Z90710 Acquired absence of both cervix and uterus: Secondary | ICD-10-CM | POA: Diagnosis not present

## 2018-09-04 DIAGNOSIS — Z9013 Acquired absence of bilateral breasts and nipples: Secondary | ICD-10-CM | POA: Diagnosis not present

## 2018-09-04 DIAGNOSIS — Z79899 Other long term (current) drug therapy: Secondary | ICD-10-CM | POA: Diagnosis not present

## 2018-09-04 DIAGNOSIS — Z923 Personal history of irradiation: Secondary | ICD-10-CM | POA: Diagnosis not present

## 2018-09-04 DIAGNOSIS — C50812 Malignant neoplasm of overlapping sites of left female breast: Secondary | ICD-10-CM | POA: Insufficient documentation

## 2018-09-04 DIAGNOSIS — Z79811 Long term (current) use of aromatase inhibitors: Secondary | ICD-10-CM | POA: Diagnosis not present

## 2018-09-04 DIAGNOSIS — Z510 Encounter for antineoplastic radiation therapy: Secondary | ICD-10-CM | POA: Insufficient documentation

## 2018-09-05 ENCOUNTER — Ambulatory Visit
Admission: RE | Admit: 2018-09-05 | Discharge: 2018-09-05 | Disposition: A | Discharge status: Home / Self Care | Payer: 59 | Source: Ambulatory Visit | Attending: Radiation Oncology | Admitting: Radiation Oncology

## 2018-09-05 DIAGNOSIS — C50812 Malignant neoplasm of overlapping sites of left female breast: Secondary | ICD-10-CM | POA: Diagnosis not present

## 2018-09-05 DIAGNOSIS — Z9013 Acquired absence of bilateral breasts and nipples: Secondary | ICD-10-CM | POA: Diagnosis not present

## 2018-09-05 DIAGNOSIS — Z170 Estrogen receptor positive status [ER+]: Secondary | ICD-10-CM | POA: Diagnosis not present

## 2018-09-05 DIAGNOSIS — Z79811 Long term (current) use of aromatase inhibitors: Secondary | ICD-10-CM | POA: Diagnosis not present

## 2018-09-05 DIAGNOSIS — Z923 Personal history of irradiation: Secondary | ICD-10-CM | POA: Diagnosis not present

## 2018-09-05 DIAGNOSIS — Z79899 Other long term (current) drug therapy: Secondary | ICD-10-CM | POA: Diagnosis not present

## 2018-09-05 DIAGNOSIS — Z90710 Acquired absence of both cervix and uterus: Secondary | ICD-10-CM | POA: Diagnosis not present

## 2018-09-08 ENCOUNTER — Ambulatory Visit: Admission: RE | Admit: 2018-09-08 | Payer: 59 | Source: Ambulatory Visit | Admitting: Radiation Oncology

## 2018-09-08 ENCOUNTER — Ambulatory Visit
Admission: RE | Admit: 2018-09-08 | Discharge: 2018-09-08 | Disposition: A | Discharge status: Home / Self Care | Payer: 59 | Source: Ambulatory Visit | Attending: Radiation Oncology | Admitting: Radiation Oncology

## 2018-09-08 DIAGNOSIS — Z79899 Other long term (current) drug therapy: Secondary | ICD-10-CM | POA: Diagnosis not present

## 2018-09-08 DIAGNOSIS — Z170 Estrogen receptor positive status [ER+]: Secondary | ICD-10-CM | POA: Diagnosis not present

## 2018-09-08 DIAGNOSIS — Z9013 Acquired absence of bilateral breasts and nipples: Secondary | ICD-10-CM | POA: Diagnosis not present

## 2018-09-08 DIAGNOSIS — Z90710 Acquired absence of both cervix and uterus: Secondary | ICD-10-CM | POA: Diagnosis not present

## 2018-09-08 DIAGNOSIS — C50812 Malignant neoplasm of overlapping sites of left female breast: Secondary | ICD-10-CM | POA: Diagnosis not present

## 2018-09-08 DIAGNOSIS — Z923 Personal history of irradiation: Secondary | ICD-10-CM | POA: Diagnosis not present

## 2018-09-08 DIAGNOSIS — Z79811 Long term (current) use of aromatase inhibitors: Secondary | ICD-10-CM | POA: Diagnosis not present

## 2018-09-09 ENCOUNTER — Ambulatory Visit
Admission: RE | Admit: 2018-09-09 | Discharge: 2018-09-09 | Disposition: A | Discharge status: Home / Self Care | Payer: 59 | Source: Ambulatory Visit | Attending: Radiation Oncology | Admitting: Radiation Oncology

## 2018-09-09 DIAGNOSIS — Z923 Personal history of irradiation: Secondary | ICD-10-CM | POA: Diagnosis not present

## 2018-09-09 DIAGNOSIS — Z90710 Acquired absence of both cervix and uterus: Secondary | ICD-10-CM | POA: Diagnosis not present

## 2018-09-09 DIAGNOSIS — Z79811 Long term (current) use of aromatase inhibitors: Secondary | ICD-10-CM | POA: Diagnosis not present

## 2018-09-09 DIAGNOSIS — Z9013 Acquired absence of bilateral breasts and nipples: Secondary | ICD-10-CM | POA: Diagnosis not present

## 2018-09-09 DIAGNOSIS — Z79899 Other long term (current) drug therapy: Secondary | ICD-10-CM | POA: Diagnosis not present

## 2018-09-09 DIAGNOSIS — Z170 Estrogen receptor positive status [ER+]: Secondary | ICD-10-CM | POA: Diagnosis not present

## 2018-09-09 DIAGNOSIS — C50812 Malignant neoplasm of overlapping sites of left female breast: Secondary | ICD-10-CM | POA: Diagnosis not present

## 2018-09-10 ENCOUNTER — Telehealth: Payer: Self-pay | Admitting: *Deleted

## 2018-09-10 ENCOUNTER — Ambulatory Visit
Admission: RE | Admit: 2018-09-10 | Discharge: 2018-09-10 | Disposition: A | Discharge status: Home / Self Care | Payer: 59 | Source: Ambulatory Visit | Attending: Radiation Oncology | Admitting: Radiation Oncology

## 2018-09-10 DIAGNOSIS — Z9013 Acquired absence of bilateral breasts and nipples: Secondary | ICD-10-CM | POA: Diagnosis not present

## 2018-09-10 DIAGNOSIS — Z170 Estrogen receptor positive status [ER+]: Secondary | ICD-10-CM | POA: Diagnosis not present

## 2018-09-10 DIAGNOSIS — Z923 Personal history of irradiation: Secondary | ICD-10-CM | POA: Diagnosis not present

## 2018-09-10 DIAGNOSIS — Z79899 Other long term (current) drug therapy: Secondary | ICD-10-CM | POA: Diagnosis not present

## 2018-09-10 DIAGNOSIS — Z90710 Acquired absence of both cervix and uterus: Secondary | ICD-10-CM | POA: Diagnosis not present

## 2018-09-10 DIAGNOSIS — Z79811 Long term (current) use of aromatase inhibitors: Secondary | ICD-10-CM | POA: Diagnosis not present

## 2018-09-10 DIAGNOSIS — C50812 Malignant neoplasm of overlapping sites of left female breast: Secondary | ICD-10-CM | POA: Diagnosis not present

## 2018-09-10 NOTE — Telephone Encounter (Signed)
Patient dropped off paperwork.

## 2018-09-11 ENCOUNTER — Telehealth: Payer: Self-pay | Admitting: *Deleted

## 2018-09-11 ENCOUNTER — Ambulatory Visit
Admission: RE | Admit: 2018-09-11 | Discharge: 2018-09-11 | Disposition: A | Discharge status: Home / Self Care | Payer: 59 | Source: Ambulatory Visit | Attending: Radiation Oncology | Admitting: Radiation Oncology

## 2018-09-11 DIAGNOSIS — C50812 Malignant neoplasm of overlapping sites of left female breast: Secondary | ICD-10-CM | POA: Diagnosis not present

## 2018-09-11 DIAGNOSIS — Z9013 Acquired absence of bilateral breasts and nipples: Secondary | ICD-10-CM | POA: Diagnosis not present

## 2018-09-11 DIAGNOSIS — Z79899 Other long term (current) drug therapy: Secondary | ICD-10-CM | POA: Diagnosis not present

## 2018-09-11 DIAGNOSIS — Z923 Personal history of irradiation: Secondary | ICD-10-CM | POA: Diagnosis not present

## 2018-09-11 DIAGNOSIS — Z170 Estrogen receptor positive status [ER+]: Secondary | ICD-10-CM | POA: Diagnosis not present

## 2018-09-11 DIAGNOSIS — Z79811 Long term (current) use of aromatase inhibitors: Secondary | ICD-10-CM | POA: Diagnosis not present

## 2018-09-11 DIAGNOSIS — Z90710 Acquired absence of both cervix and uterus: Secondary | ICD-10-CM | POA: Diagnosis not present

## 2018-09-11 NOTE — Telephone Encounter (Signed)
Patient dropped off paperwork.

## 2018-09-12 ENCOUNTER — Ambulatory Visit: Payer: 59

## 2018-09-12 ENCOUNTER — Ambulatory Visit
Admission: RE | Admit: 2018-09-12 | Discharge: 2018-09-12 | Disposition: A | Discharge status: Home / Self Care | Payer: 59 | Source: Ambulatory Visit | Attending: Radiation Oncology | Admitting: Radiation Oncology

## 2018-09-12 DIAGNOSIS — Z923 Personal history of irradiation: Secondary | ICD-10-CM | POA: Diagnosis not present

## 2018-09-12 DIAGNOSIS — Z79811 Long term (current) use of aromatase inhibitors: Secondary | ICD-10-CM | POA: Diagnosis not present

## 2018-09-12 DIAGNOSIS — Z79899 Other long term (current) drug therapy: Secondary | ICD-10-CM | POA: Diagnosis not present

## 2018-09-12 DIAGNOSIS — M25552 Pain in left hip: Secondary | ICD-10-CM | POA: Diagnosis not present

## 2018-09-12 DIAGNOSIS — C50812 Malignant neoplasm of overlapping sites of left female breast: Secondary | ICD-10-CM | POA: Diagnosis not present

## 2018-09-12 DIAGNOSIS — G479 Sleep disorder, unspecified: Secondary | ICD-10-CM | POA: Diagnosis not present

## 2018-09-12 DIAGNOSIS — Z170 Estrogen receptor positive status [ER+]: Secondary | ICD-10-CM | POA: Diagnosis not present

## 2018-09-12 DIAGNOSIS — Z9013 Acquired absence of bilateral breasts and nipples: Secondary | ICD-10-CM | POA: Diagnosis not present

## 2018-09-12 DIAGNOSIS — R635 Abnormal weight gain: Secondary | ICD-10-CM | POA: Diagnosis not present

## 2018-09-12 DIAGNOSIS — Z90710 Acquired absence of both cervix and uterus: Secondary | ICD-10-CM | POA: Diagnosis not present

## 2018-09-15 ENCOUNTER — Ambulatory Visit
Admission: RE | Admit: 2018-09-15 | Discharge: 2018-09-15 | Disposition: A | Discharge status: Home / Self Care | Payer: 59 | Source: Ambulatory Visit | Attending: Radiation Oncology | Admitting: Radiation Oncology

## 2018-09-15 ENCOUNTER — Ambulatory Visit: Payer: 59

## 2018-09-15 DIAGNOSIS — C50812 Malignant neoplasm of overlapping sites of left female breast: Secondary | ICD-10-CM | POA: Diagnosis not present

## 2018-09-15 DIAGNOSIS — Z90710 Acquired absence of both cervix and uterus: Secondary | ICD-10-CM | POA: Diagnosis not present

## 2018-09-15 DIAGNOSIS — Z170 Estrogen receptor positive status [ER+]: Secondary | ICD-10-CM | POA: Diagnosis not present

## 2018-09-15 DIAGNOSIS — Z9013 Acquired absence of bilateral breasts and nipples: Secondary | ICD-10-CM | POA: Diagnosis not present

## 2018-09-15 DIAGNOSIS — Z923 Personal history of irradiation: Secondary | ICD-10-CM | POA: Diagnosis not present

## 2018-09-15 DIAGNOSIS — Z79899 Other long term (current) drug therapy: Secondary | ICD-10-CM | POA: Diagnosis not present

## 2018-09-15 DIAGNOSIS — Z79811 Long term (current) use of aromatase inhibitors: Secondary | ICD-10-CM | POA: Diagnosis not present

## 2018-09-15 MED FILL — OMEPRAZOLE 40 MG CPDR: 40 | 90 days supply | Qty: 90 | Fill #0

## 2018-09-15 MED FILL — ROSUVASTATIN CALCIUM 10 MG: 10 | 90 days supply | Qty: 90 | Fill #2

## 2018-09-16 ENCOUNTER — Ambulatory Visit
Admission: RE | Admit: 2018-09-16 | Discharge: 2018-09-16 | Disposition: A | Discharge status: Home / Self Care | Payer: 59 | Source: Ambulatory Visit | Attending: Radiation Oncology | Admitting: Radiation Oncology

## 2018-09-16 DIAGNOSIS — Z79899 Other long term (current) drug therapy: Secondary | ICD-10-CM | POA: Diagnosis not present

## 2018-09-16 DIAGNOSIS — Z90710 Acquired absence of both cervix and uterus: Secondary | ICD-10-CM | POA: Diagnosis not present

## 2018-09-16 DIAGNOSIS — Z9013 Acquired absence of bilateral breasts and nipples: Secondary | ICD-10-CM | POA: Diagnosis not present

## 2018-09-16 DIAGNOSIS — C50812 Malignant neoplasm of overlapping sites of left female breast: Secondary | ICD-10-CM | POA: Diagnosis not present

## 2018-09-16 DIAGNOSIS — Z79811 Long term (current) use of aromatase inhibitors: Secondary | ICD-10-CM | POA: Diagnosis not present

## 2018-09-16 DIAGNOSIS — Z170 Estrogen receptor positive status [ER+]: Secondary | ICD-10-CM | POA: Diagnosis not present

## 2018-09-16 DIAGNOSIS — Z923 Personal history of irradiation: Secondary | ICD-10-CM | POA: Diagnosis not present

## 2018-09-16 NOTE — Progress Notes (Signed)
Patient Care Team: Lawerance Cruel, MD as PCP - General (Family Medicine) Nicholas Lose, MD as Consulting Physician (Hematology and Oncology) Jovita Kussmaul, MD as Consulting Physician (General Surgery) Eppie Gibson, MD as Attending Physician (Radiation Oncology)  DIAGNOSIS:    ICD-10-CM   1. Malignant neoplasm of overlapping sites of left breast in female, estrogen receptor positive ([place]) C50.812    Z17.0     SUMMARY OF ONCOLOGIC HISTORY:   Malignant neoplasm of overlapping sites of left breast in female, estrogen receptor positive ([place])   05/06/2018 Initial Diagnosis    Screening detected left breast mass anteriorly 1030 to 11 o'clock position 2 masses 6 mm and 7 mm, 12:30 position 2.6 cm both of these masses biopsy-proven grade 2 invasive lobular cancer ER 100%, PR 10%, Ki-67 15%, HER-2 negative 1+ by IHC, lymph node positive for malignancy T2N1 stage IIa clinical stage    05/21/2018 Cancer Staging    Staging form: Breast, AJCC 8th Edition - Pathologic: Stage IB (pT3, pN1a, cM0, G2, ER+, PR+, HER2-) - Signed by Nicholas Lose, MD on 06/24/2018    06/03/2018 Miscellaneous    Genetics negative    06/13/2018 Surgery    Bilateral mastectomies: Left: ILC, grade 2, 5.4 cm, LCIS, margins negative, lymphovascular invasion present, 2/7 lymph nodes positive with extranodal extension (1 additional lymph node had isolated tumor cells), right mastectomy benign; T3N1a ER 100%, PR 10%, HER-2 -1+, Ki-67 10 to 15%, stage Ib    08/06/2018 -  Radiation Therapy    Adjuvant XRT     CHIEF COMPLIANT: Follow-up to discuss anti-estrogen treatment  INTERVAL HISTORY: Dawn Booker is a 63 y.o. with above-mentioned history of left breast cancer underwent bilateral mastectomies and is currently on radiation treatment since 08/06/18. She presents to the clinic alone today and has one week of radiation left. She reports mild hot flashes. She has a history of osteoporosis and a bone scan from 04/25/18 showed  a T-score of -3.1. She had concern about taking Femara because a friend took it and fractured her hip. She has denied taking calcium because it gave her stomach pain. She reports chest pain that radiates from her mid-back up her chest that is related to her gallbladder. She reports pain in her groin area that prevents her from moving. She has a history of hysterectomy. She is planning on starting a routine exercise plan once treatment is over. She questioned if it was okay to get her eyes checked. She does not want to participate in a clinical trial.   REVIEW OF SYSTEMS:   Constitutional: Denies fevers, chills or abnormal weight loss (+) mild hot flashes  Eyes: Denies blurriness of vision Ears, nose, mouth, throat, and face: Denies mucositis or sore throat Respiratory: Denies cough, dyspnea or wheezes Cardiovascular: Denies palpitation (+) chest discomfort Gastrointestinal:  Denies nausea, heartburn or change in bowel habits Skin: (+) redness on chest at radiation site MSK: (+) groin pain Lymphatics: Denies new lymphadenopathy or easy bruising Neurological: Denies numbness, tingling or new weaknesses Behavioral/Psych: Mood is stable, no new changes  Extremities: No lower extremity edema Breast: denies any pain or lumps or nodules in either breasts All other systems were reviewed with the patient and are negative.  I have reviewed the past medical history, past surgical history, social history and family history with the patient and they are unchanged from previous note.  ALLERGIES:  is allergic to lamictal [lamotrigine]; hydrocodone; sulfa antibiotics; brintellix [vortioxetine]; corticosteroids; morphine; other; prednisone; and zoloft [sertraline].  MEDICATIONS:  Current Outpatient Medications  Medication Sig Dispense Refill  . acetaminophen (TYLENOL) 500 MG tablet Take 1,000 mg by mouth at bedtime.     Derrill Memo ON 10/09/2018] anastrozole (ARIMIDEX) 1 MG tablet Take 1 tablet (1 mg total) by  mouth daily. 90 tablet 3  . buPROPion (WELLBUTRIN XL) 150 MG 24 hr tablet Take 150 mg by mouth daily.     . Cholecalciferol (VITAMIN D) 2000 units CAPS Take 2,000 Units by mouth daily.     . diphenhydrAMINE (BENADRYL) 25 mg capsule Take 25 mg by mouth at bedtime.    . folic acid (FOLVITE) 459 MCG tablet Take 400 mcg by mouth daily.    Marland Kitchen gabapentin (NEURONTIN) 300 MG capsule 2 capsules 3 times daily, 3 capsules at night (Patient taking differently: Take 600 mg by mouth every evening. 600 mg in the evening) 270 capsule 5  . HYDROcodone-acetaminophen (NORCO/VICODIN) 5-325 MG tablet Take 1-2 tablets by mouth every 6 (six) hours as needed for moderate pain or severe pain. 15 tablet 0  . hydrOXYzine (VISTARIL) 25 MG capsule Take 25 mg by mouth as needed for anxiety.    Marland Kitchen omeprazole (PRILOSEC) 40 MG capsule Take 40 mg by mouth daily.  4  . promethazine (PHENERGAN) 25 MG tablet Take 25 mg by mouth every 6 (six) hours as needed. for nausea  0  . rosuvastatin (CRESTOR) 10 MG tablet Take 1 tablet (10 mg total) by mouth daily. 30 tablet 11  . rosuvastatin (CRESTOR) 10 MG tablet Take 10 mg by mouth daily.    . temazepam (RESTORIL) 30 MG capsule Take 30 mg by mouth at bedtime.     . traMADol (ULTRAM) 50 MG tablet Take 1-2 tablets (50-100 mg total) by mouth every 6 (six) hours as needed. 20 tablet 1   No current facility-administered medications for this visit.     PHYSICAL EXAMINATION: ECOG PERFORMANCE STATUS: 1 - Symptomatic but completely ambulatory  Vitals:   09/17/18 1515  BP: (!) 141/84  Pulse: 80  Resp: 18  Temp: 97.7 F (36.5 C)  SpO2: 100%   Filed Weights   09/17/18 1515  Weight: 156 lb 6.4 oz (70.9 kg)    GENERAL: alert, no distress and comfortable SKIN: skin color, texture, turgor are normal, no rashes or significant lesions EYES: normal, Conjunctiva are pink and non-injected, sclera clear OROPHARYNX: no exudate, no erythema and lips, buccal mucosa, and tongue normal  NECK: supple,  thyroid normal size, non-tender, without nodularity LYMPH: no palpable lymphadenopathy in the cervical, axillary or inguinal LUNGS: clear to auscultation and percussion with normal breathing effort HEART: regular rate & rhythm and no murmurs and no lower extremity edema ABDOMEN: abdomen soft, non-tender and normal bowel sounds MUSCULOSKELETAL: no cyanosis of digits and no clubbing  NEURO: alert & oriented x 3 with fluent speech, no focal motor/sensory deficits EXTREMITIES: No lower extremity edema Breast: Radiation dermatitis  LABORATORY DATA:  I have reviewed the data as listed CMP Latest Ref Rng & Units 07/04/2018 06/06/2018 05/21/2018  Glucose 70 - 99 mg/dL 92 79 87  BUN 8 - 23 mg/dL '12 10 10  ' Creatinine 0.44 - 1.00 mg/dL 0.76 0.76 0.94  Sodium 135 - 145 mmol/L 140 139 142  Potassium 3.5 - 5.1 mmol/L 3.5 3.6 3.8  Chloride 98 - 111 mmol/L 109 107 105  CO2 22 - 32 mmol/L '26 26 28  ' Calcium 8.9 - 10.3 mg/dL 8.8(L) 9.2 9.5  Total Protein 6.5 - 8.1 g/dL - - 7.4  Total Bilirubin 0.3 - 1.2 mg/dL - - 0.5  Alkaline Phos 38 - 126 U/L - - 73  AST 15 - 41 U/L - - 16  ALT 0 - 44 U/L - - 17    Lab Results  Component Value Date   WBC 6.4 07/04/2018   HGB 12.6 07/04/2018   HCT 39.9 07/04/2018   MCV 98.5 07/04/2018   PLT 202 07/04/2018   NEUTROABS 4.1 05/21/2018    ASSESSMENT & PLAN:  Malignant neoplasm of overlapping sites of left breast in female, estrogen receptor positive ([place]) 06/13/2018:Bilateral mastectomies: Left: ILC, grade 2, 5.4 cm, LCIS, margins negative, lymphovascular invasion present, 2/7 lymph nodes positive with extranodal extension (1 additional lymph node had isolated tumor cells), right mastectomy benign; T3N1a ER 100%, PR 10%, HER-2 -1+, Ki-67 10 to 15%, stage Ib MammaPrint: Low risk, luminal type a Adjuvant radiation therapy: 08/14/2018-  Treatment plan:Adjuvant antiestrogen therapy with anastrozole 1 mg daily x5 to 7 years Patient did not want to participate in  Natalee clinical trial  Anastrozole counseling: We discussed the risks and benefits of anti-estrogen therapy with aromatase inhibitors. These include but not limited to insomnia, hot flashes, mood changes, vaginal dryness, bone density loss, and weight gain. We strongly believe that the benefits far outweigh the risks. Patient understands these risks and consented to starting treatment. Planned treatment duration is 5-7 years.  Osteoporosis: Patient tells me that she has known diagnosis of osteoporosis.  She will discuss this with her primary care physician about starting either Fosamax or Boniva or Prolia. Return to clinic in 3 months for survivorship care plan visit      No orders of the defined types were placed in this encounter.  The patient has a good understanding of the overall plan. she agrees with it. she will call with any problems that may develop before the next visit here.  Nicholas Lose, MD 09/17/2018  Julious Oka Dorshimer am acting as scribe for Dr. Nicholas Lose.  I have reviewed the above documentation for accuracy and completeness, and I agree with the above.

## 2018-09-17 ENCOUNTER — Inpatient Hospital Stay: Payer: 59 | Attending: Hematology and Oncology | Admitting: Hematology and Oncology

## 2018-09-17 ENCOUNTER — Telehealth: Payer: Self-pay | Admitting: Hematology and Oncology

## 2018-09-17 ENCOUNTER — Ambulatory Visit
Admission: RE | Admit: 2018-09-17 | Discharge: 2018-09-17 | Disposition: A | Discharge status: Home / Self Care | Payer: 59 | Source: Ambulatory Visit | Attending: Radiation Oncology | Admitting: Radiation Oncology

## 2018-09-17 DIAGNOSIS — Z90710 Acquired absence of both cervix and uterus: Secondary | ICD-10-CM | POA: Insufficient documentation

## 2018-09-17 DIAGNOSIS — C50812 Malignant neoplasm of overlapping sites of left female breast: Secondary | ICD-10-CM | POA: Diagnosis not present

## 2018-09-17 DIAGNOSIS — Z170 Estrogen receptor positive status [ER+]: Secondary | ICD-10-CM | POA: Insufficient documentation

## 2018-09-17 DIAGNOSIS — Z79811 Long term (current) use of aromatase inhibitors: Secondary | ICD-10-CM | POA: Insufficient documentation

## 2018-09-17 DIAGNOSIS — Z9013 Acquired absence of bilateral breasts and nipples: Secondary | ICD-10-CM | POA: Insufficient documentation

## 2018-09-17 DIAGNOSIS — Z79899 Other long term (current) drug therapy: Secondary | ICD-10-CM | POA: Diagnosis not present

## 2018-09-17 DIAGNOSIS — Z923 Personal history of irradiation: Secondary | ICD-10-CM | POA: Insufficient documentation

## 2018-09-17 MED ORDER — ANASTROZOLE 1 MG PO TABS: 1.0000 mg | ORAL_TABLET | Freq: Every day | ORAL | 3 refills | Status: DC

## 2018-09-17 MED FILL — ANASTROZOLE 1 MG TABLET: 1 | 90 days supply | Qty: 90 | Fill #0

## 2018-09-17 NOTE — Assessment & Plan Note (Addendum)
06/13/2018:Bilateral mastectomies: Left: ILC, grade 2, 5.4 cm, LCIS, margins negative, lymphovascular invasion present, 2/7 lymph nodes positive with extranodal extension (1 additional lymph node had isolated tumor cells), right mastectomy benign; T3N1a ER 100%, PR 10%, HER-2 -1+, Ki-67 10 to 15%, stage Ib MammaPrint: Low risk, luminal type a Adjuvant radiation therapy: 08/14/2018-  Treatment plan:Adjuvant antiestrogen therapy with anastrozole 1 mg daily x5 to 7 years Patient did not want to participate in Natalee clinical trial  Anastrozole counseling: We discussed the risks and benefits of anti-estrogen therapy with aromatase inhibitors. These include but not limited to insomnia, hot flashes, mood changes, vaginal dryness, bone density loss, and weight gain. We strongly believe that the benefits far outweigh the risks. Patient understands these risks and consented to starting treatment. Planned treatment duration is 5-7 years.  Osteoporosis: Patient tells me that she has known diagnosis of osteoporosis.  She will discuss this with her primary care physician about starting either Fosamax or Boniva or Prolia. Return to clinic in 3 months for survivorship care plan visit

## 2018-09-17 NOTE — Telephone Encounter (Signed)
Patient decline avs and calendar °

## 2018-09-18 ENCOUNTER — Ambulatory Visit: Payer: 59

## 2018-09-18 ENCOUNTER — Ambulatory Visit
Admission: RE | Admit: 2018-09-18 | Discharge: 2018-09-18 | Disposition: A | Discharge status: Home / Self Care | Payer: 59 | Source: Ambulatory Visit | Attending: Radiation Oncology | Admitting: Radiation Oncology

## 2018-09-18 DIAGNOSIS — Z170 Estrogen receptor positive status [ER+]: Secondary | ICD-10-CM | POA: Diagnosis not present

## 2018-09-18 DIAGNOSIS — Z90710 Acquired absence of both cervix and uterus: Secondary | ICD-10-CM | POA: Diagnosis not present

## 2018-09-18 DIAGNOSIS — Z923 Personal history of irradiation: Secondary | ICD-10-CM | POA: Diagnosis not present

## 2018-09-18 DIAGNOSIS — C50812 Malignant neoplasm of overlapping sites of left female breast: Secondary | ICD-10-CM | POA: Diagnosis not present

## 2018-09-18 DIAGNOSIS — Z79899 Other long term (current) drug therapy: Secondary | ICD-10-CM | POA: Diagnosis not present

## 2018-09-18 DIAGNOSIS — Z9013 Acquired absence of bilateral breasts and nipples: Secondary | ICD-10-CM | POA: Diagnosis not present

## 2018-09-18 DIAGNOSIS — Z79811 Long term (current) use of aromatase inhibitors: Secondary | ICD-10-CM | POA: Diagnosis not present

## 2018-09-19 ENCOUNTER — Ambulatory Visit
Admission: RE | Admit: 2018-09-19 | Discharge: 2018-09-19 | Disposition: A | Discharge status: Home / Self Care | Payer: 59 | Source: Ambulatory Visit | Attending: Radiation Oncology | Admitting: Radiation Oncology

## 2018-09-19 ENCOUNTER — Ambulatory Visit: Payer: 59

## 2018-09-19 DIAGNOSIS — Z170 Estrogen receptor positive status [ER+]: Secondary | ICD-10-CM | POA: Diagnosis not present

## 2018-09-19 DIAGNOSIS — Z9013 Acquired absence of bilateral breasts and nipples: Secondary | ICD-10-CM | POA: Diagnosis not present

## 2018-09-19 DIAGNOSIS — Z923 Personal history of irradiation: Secondary | ICD-10-CM | POA: Diagnosis not present

## 2018-09-19 DIAGNOSIS — Z90710 Acquired absence of both cervix and uterus: Secondary | ICD-10-CM | POA: Diagnosis not present

## 2018-09-19 DIAGNOSIS — Z79811 Long term (current) use of aromatase inhibitors: Secondary | ICD-10-CM | POA: Diagnosis not present

## 2018-09-19 DIAGNOSIS — Z79899 Other long term (current) drug therapy: Secondary | ICD-10-CM | POA: Diagnosis not present

## 2018-09-19 DIAGNOSIS — C50812 Malignant neoplasm of overlapping sites of left female breast: Secondary | ICD-10-CM | POA: Diagnosis not present

## 2018-09-22 ENCOUNTER — Ambulatory Visit: Payer: 59

## 2018-09-22 ENCOUNTER — Ambulatory Visit
Admission: RE | Admit: 2018-09-22 | Discharge: 2018-09-22 | Disposition: A | Discharge status: Home / Self Care | Payer: 59 | Source: Ambulatory Visit | Attending: Radiation Oncology | Admitting: Radiation Oncology

## 2018-09-22 DIAGNOSIS — Z79811 Long term (current) use of aromatase inhibitors: Secondary | ICD-10-CM | POA: Diagnosis not present

## 2018-09-22 DIAGNOSIS — Z170 Estrogen receptor positive status [ER+]: Secondary | ICD-10-CM | POA: Diagnosis not present

## 2018-09-22 DIAGNOSIS — Z923 Personal history of irradiation: Secondary | ICD-10-CM | POA: Diagnosis not present

## 2018-09-22 DIAGNOSIS — C50812 Malignant neoplasm of overlapping sites of left female breast: Secondary | ICD-10-CM | POA: Diagnosis not present

## 2018-09-22 DIAGNOSIS — Z90710 Acquired absence of both cervix and uterus: Secondary | ICD-10-CM | POA: Diagnosis not present

## 2018-09-22 DIAGNOSIS — Z9013 Acquired absence of bilateral breasts and nipples: Secondary | ICD-10-CM | POA: Diagnosis not present

## 2018-09-22 DIAGNOSIS — Z79899 Other long term (current) drug therapy: Secondary | ICD-10-CM | POA: Diagnosis not present

## 2018-09-22 MED ORDER — SONAFINE EX EMUL
1.0000 "application " | Freq: Once | CUTANEOUS | Status: AC
  Administered 2018-09-22: 1 via TOPICAL

## 2018-09-23 ENCOUNTER — Ambulatory Visit
Admission: RE | Admit: 2018-09-23 | Discharge: 2018-09-23 | Disposition: A | Discharge status: Home / Self Care | Payer: 59 | Source: Ambulatory Visit | Attending: Radiation Oncology | Admitting: Radiation Oncology

## 2018-09-23 ENCOUNTER — Ambulatory Visit: Payer: 59

## 2018-09-23 ENCOUNTER — Encounter: Payer: Self-pay | Admitting: *Deleted

## 2018-09-23 DIAGNOSIS — Z170 Estrogen receptor positive status [ER+]: Secondary | ICD-10-CM | POA: Diagnosis not present

## 2018-09-23 DIAGNOSIS — Z90710 Acquired absence of both cervix and uterus: Secondary | ICD-10-CM | POA: Diagnosis not present

## 2018-09-23 DIAGNOSIS — Z923 Personal history of irradiation: Secondary | ICD-10-CM | POA: Diagnosis not present

## 2018-09-23 DIAGNOSIS — Z79811 Long term (current) use of aromatase inhibitors: Secondary | ICD-10-CM | POA: Diagnosis not present

## 2018-09-23 DIAGNOSIS — C50812 Malignant neoplasm of overlapping sites of left female breast: Secondary | ICD-10-CM | POA: Diagnosis not present

## 2018-09-23 DIAGNOSIS — Z9013 Acquired absence of bilateral breasts and nipples: Secondary | ICD-10-CM | POA: Diagnosis not present

## 2018-09-23 DIAGNOSIS — Z79899 Other long term (current) drug therapy: Secondary | ICD-10-CM | POA: Diagnosis not present

## 2018-09-24 ENCOUNTER — Ambulatory Visit: Payer: 59

## 2018-09-24 ENCOUNTER — Ambulatory Visit
Admission: RE | Admit: 2018-09-24 | Discharge: 2018-09-24 | Disposition: A | Discharge status: Home / Self Care | Payer: 59 | Source: Ambulatory Visit | Attending: Radiation Oncology | Admitting: Radiation Oncology

## 2018-09-24 DIAGNOSIS — Z79899 Other long term (current) drug therapy: Secondary | ICD-10-CM | POA: Diagnosis not present

## 2018-09-24 DIAGNOSIS — C50812 Malignant neoplasm of overlapping sites of left female breast: Secondary | ICD-10-CM | POA: Diagnosis not present

## 2018-09-24 DIAGNOSIS — Z90710 Acquired absence of both cervix and uterus: Secondary | ICD-10-CM | POA: Diagnosis not present

## 2018-09-24 DIAGNOSIS — Z79811 Long term (current) use of aromatase inhibitors: Secondary | ICD-10-CM | POA: Diagnosis not present

## 2018-09-24 DIAGNOSIS — Z9013 Acquired absence of bilateral breasts and nipples: Secondary | ICD-10-CM | POA: Diagnosis not present

## 2018-09-24 DIAGNOSIS — Z923 Personal history of irradiation: Secondary | ICD-10-CM | POA: Diagnosis not present

## 2018-09-24 DIAGNOSIS — Z170 Estrogen receptor positive status [ER+]: Secondary | ICD-10-CM | POA: Diagnosis not present

## 2018-09-24 LAB — TSH: TSH: 1.58 (ref <=5.90)

## 2018-09-25 ENCOUNTER — Ambulatory Visit
Admission: RE | Admit: 2018-09-25 | Discharge: 2018-09-25 | Disposition: A | Discharge status: Home / Self Care | Payer: 59 | Source: Ambulatory Visit | Attending: Radiation Oncology | Admitting: Radiation Oncology

## 2018-09-25 ENCOUNTER — Ambulatory Visit: Payer: 59

## 2018-09-25 DIAGNOSIS — Z170 Estrogen receptor positive status [ER+]: Secondary | ICD-10-CM | POA: Diagnosis not present

## 2018-09-25 DIAGNOSIS — Z9013 Acquired absence of bilateral breasts and nipples: Secondary | ICD-10-CM | POA: Diagnosis not present

## 2018-09-25 DIAGNOSIS — Z79811 Long term (current) use of aromatase inhibitors: Secondary | ICD-10-CM | POA: Diagnosis not present

## 2018-09-25 DIAGNOSIS — Z79899 Other long term (current) drug therapy: Secondary | ICD-10-CM | POA: Diagnosis not present

## 2018-09-25 DIAGNOSIS — Z923 Personal history of irradiation: Secondary | ICD-10-CM | POA: Diagnosis not present

## 2018-09-25 DIAGNOSIS — C50812 Malignant neoplasm of overlapping sites of left female breast: Secondary | ICD-10-CM | POA: Diagnosis not present

## 2018-09-25 DIAGNOSIS — Z90710 Acquired absence of both cervix and uterus: Secondary | ICD-10-CM | POA: Diagnosis not present

## 2018-09-26 ENCOUNTER — Ambulatory Visit: Payer: 59

## 2018-09-29 ENCOUNTER — Ambulatory Visit: Payer: 59

## 2018-09-29 MED FILL — TEMAZEPAM 30 MG CAPSULE: 30 | 30 days supply | Qty: 30 | Fill #0

## 2018-09-30 ENCOUNTER — Ambulatory Visit: Payer: 59

## 2018-09-30 ENCOUNTER — Encounter: Payer: Self-pay | Admitting: Radiation Oncology

## 2018-09-30 NOTE — Progress Notes (Signed)
  Radiation Oncology         [phone] ________________________________  Name: Dawn Booker MRN: [id number]  Date: 09/30/2018  DOB: November 12, 1955  End of Treatment Note  Diagnosis:   Left Breast Invasive Lobular Carcinoma with LCIS    Cancer Staging Malignant neoplasm of overlapping sites of left breast in female, estrogen receptor positive ([place]) Staging form: Breast, AJCC 8th Edition - Clinical stage from 05/21/2018: Stage IIA (cT2, cN1, cM0, G2, ER+, PR+, HER2-) - Unsigned - Pathologic: Stage IB (pT3, pN1a, cM0, G2, ER+, PR+, HER2-) - Signed by Nicholas Lose, MD on 06/24/2018   Indication for treatment:  Curative       Radiation treatment dates:   08/13/2018 - 09/25/2018  Site/dose:    1) Left Chest Wall and IM nodes/ 50 Gy in 25 fractions 2) Left Supraclavicular fossa and PAB/ 50 Gy in 25 fractions 3) Left Chest Wall Scar boost / 10 Gy in 5 fractions  Beams/energy:    1) 3D Opposed tangents / 10 and 6 MV photons 2) 3D / 10 and 6 MV photons 3) En face electrons / 6 MeV electrons  Narrative: The patient tolerated radiation treatment relatively well. She experienced peeling without oozing over her left axilla with bright erythema and dryness. She reported severe fatigue that improved by the end of treatments.  Plan: The patient has completed radiation treatment. The patient will return to radiation oncology clinic for routine followup in one month. I advised them to call or return sooner if they have any questions or concerns related to their recovery or treatment.  -----------------------------------  Eppie Gibson, MD  This document serves as a record of services personally performed by Eppie Gibson, MD. It was created on her behalf by Wilburn Mylar, a trained medical scribe. The creation of this record is based on the scribe's personal observations and the provider's statements to them. This document has been checked and approved by the attending provider.

## 2018-10-01 ENCOUNTER — Ambulatory Visit: Payer: 59

## 2018-10-01 DIAGNOSIS — M1612 Unilateral primary osteoarthritis, left hip: Secondary | ICD-10-CM | POA: Diagnosis not present

## 2018-10-01 DIAGNOSIS — M810 Age-related osteoporosis without current pathological fracture: Secondary | ICD-10-CM | POA: Diagnosis not present

## 2018-10-01 MED FILL — MELOXICAM 7.5 MG TABLET: 7.5 | 22 days supply | Qty: 45 | Fill #0

## 2018-10-02 ENCOUNTER — Other Ambulatory Visit (HOSPITAL_COMMUNITY): Payer: Self-pay | Admitting: Sports Medicine

## 2018-10-02 ENCOUNTER — Other Ambulatory Visit: Payer: Self-pay | Admitting: Sports Medicine

## 2018-10-02 DIAGNOSIS — M25551 Pain in right hip: Secondary | ICD-10-CM

## 2018-10-02 NOTE — Progress Notes (Signed)
FMLA successfully faxed to [employer] at [phone]. Mailed copy to patient address on file.

## 2018-10-08 DIAGNOSIS — M25552 Pain in left hip: Secondary | ICD-10-CM | POA: Diagnosis not present

## 2018-10-08 DIAGNOSIS — M1612 Unilateral primary osteoarthritis, left hip: Secondary | ICD-10-CM | POA: Diagnosis not present

## 2018-10-08 MED FILL — buPROPion HCL ER (XL) 150 M: 150 | 90 days supply | Qty: 90 | Fill #0

## 2018-10-09 DIAGNOSIS — M25552 Pain in left hip: Secondary | ICD-10-CM | POA: Diagnosis not present

## 2018-10-13 ENCOUNTER — Telehealth: Payer: Self-pay | Admitting: Hematology and Oncology

## 2018-10-13 ENCOUNTER — Other Ambulatory Visit: Payer: Self-pay | Admitting: Hematology and Oncology

## 2018-10-13 DIAGNOSIS — R937 Abnormal findings on diagnostic imaging of other parts of musculoskeletal system: Secondary | ICD-10-CM

## 2018-10-13 NOTE — Telephone Encounter (Signed)
I received a phone call from Dr. Laureen Abrahams office and that Dawn Booker had a MRI of her back which showed L4 and L5 small lesions and they could not rule out metastatic disease. Because of this I would like to obtain a PET CT scan for further evaluation.  I called her with this information.  She is willing to go through the testing.

## 2018-10-20 ENCOUNTER — Telehealth: Payer: Self-pay | Admitting: *Deleted

## 2018-10-20 MED ORDER — LORAZEPAM 0.5 MG PO TABS: 0.5000 mg | ORAL_TABLET | Freq: Three times a day (TID) | ORAL | 0 refills | Status: DC | PRN

## 2018-10-20 MED FILL — LORazepam 0.5 MG TABS: 0.5 | 10 days supply | Qty: 30 | Fill #0

## 2018-10-20 NOTE — Telephone Encounter (Signed)
Scheduled and confirmed PET scan for 2/25 arrive at 3:30. Gave pt instructions NPO 6 hr prior and no carbs night prior. Discussed lorazepam prescription and instructions. Denies further needs or questions at this time.

## 2018-10-21 ENCOUNTER — Other Ambulatory Visit: Payer: Self-pay | Admitting: *Deleted

## 2018-10-21 DIAGNOSIS — C50812 Malignant neoplasm of overlapping sites of left female breast: Secondary | ICD-10-CM

## 2018-10-21 DIAGNOSIS — Z170 Estrogen receptor positive status [ER+]: Secondary | ICD-10-CM

## 2018-10-22 ENCOUNTER — Ambulatory Visit (HOSPITAL_COMMUNITY): Payer: 59

## 2018-10-23 ENCOUNTER — Ambulatory Visit: Payer: 59 | Attending: General Surgery | Admitting: Physical Therapy

## 2018-10-23 ENCOUNTER — Encounter: Payer: Self-pay | Admitting: Physical Therapy

## 2018-10-23 ENCOUNTER — Other Ambulatory Visit: Payer: Self-pay

## 2018-10-23 DIAGNOSIS — M6281 Muscle weakness (generalized): Secondary | ICD-10-CM | POA: Diagnosis not present

## 2018-10-23 DIAGNOSIS — I972 Postmastectomy lymphedema syndrome: Secondary | ICD-10-CM | POA: Diagnosis not present

## 2018-10-23 DIAGNOSIS — R293 Abnormal posture: Secondary | ICD-10-CM | POA: Insufficient documentation

## 2018-10-23 DIAGNOSIS — M25612 Stiffness of left shoulder, not elsewhere classified: Secondary | ICD-10-CM | POA: Insufficient documentation

## 2018-10-23 DIAGNOSIS — L599 Disorder of the skin and subcutaneous tissue related to radiation, unspecified: Secondary | ICD-10-CM | POA: Insufficient documentation

## 2018-10-23 NOTE — Therapy (Signed)
[place], [place], [address] Phone: [phone]   Fax:  [phone]  Physical Therapy Evaluation  Patient Details  Name: Dawn Booker MRN: [id number] Date of Birth: [birth year] Referring Provider (PT): Dr.Gudena    Encounter Date: 10/23/2018  PT End of Session - 10/23/18 1226    Visit Number  1    Number of Visits  9    Date for PT Re-Evaluation  11/21/18    PT Start Time  0930    PT Stop Time  1015    PT Time Calculation (min)  45 min    Activity Tolerance  Patient tolerated treatment well    Behavior During Therapy  [facility] for tasks assessed/performed       Past Medical History:  Diagnosis Date  . ADD (attention deficit disorder)   . Anxiety   . Cancer [facility])    breast cancer  . Depression   . GERD (gastroesophageal reflux disease)   . Hiatal hernia   . Hyperlipemia, mixed   . Intermittent palpitations   . Malignant neoplasm of overlapping sites of left breast in female, estrogen receptor positive ([place]) 05/06/2018   oncologist-  dr Lindi Adie--  Invasive Lobular Cancer, CIS, Stage IB, Grade 2 (pT3,pN1a,cM0), ER+---  s/p  left mastectomy with sln dissections and right mastectomy (benign)  . PONV (postoperative nausea and vomiting)   . Right thyroid nodule   . Shingles 12/2017    Past Surgical History:  Procedure Laterality Date  . AXILLARY LYMPH NODE DISSECTION Left 07/09/2018   Procedure: COMPLETION OF LEFT AXILLARY LYMPH NODE DISSECTION;  Surgeon: Jovita Kussmaul, MD;  Location: [facility];  Service: General;  Laterality: Left;  . BREAST EXCISIONAL BIOPSY Left 1995   benign  . CARDIOVASCULAR STRESS TEST  06/21/2017   normal nuclear stress study w/ no ischemia/  normal LV function and wall function, nuclear stress ef 70%  . ELBOW SURGERY Left child  . MASTECTOMY W/ SENTINEL NODE BIOPSY Left 06/13/2018  . MASTECTOMY WITH RADIOACTIVE SEED GUIDED EXCISION AND AXILLARY SENTINEL LYMPH NODE BIOPSY  Bilateral 06/13/2018   Procedure: LEFT MASTECTOMY WITH SENTINEL LYMPH NODE MAPPING AND TARGETED NODE DISSECTION AND RIGHT PROPHYLATIC MASTECTOMY;  Surgeon: Jovita Kussmaul, MD;  Location: [place];  Service: General;  Laterality: Bilateral;  . THYROIDECTOMY Left 10-31-2001   dr gerkin '@[facility]'   . TRANSTHORACIC ECHOCARDIOGRAM  06/21/2017   ef 60-65%/  trivial MR (no evidence mvp)  . TUBAL LIGATION  yrs ago  . VAGINAL HYSTERECTOMY  1990s    There were no vitals filed for this visit.   Subjective Assessment - 10/23/18 0939    Subjective  Pt has had some problems with her left hip ( Labral tear?) and so she did not do alot of stetches with haer arms     Pertinent History  Patient was diagnosed on 04/25/18 with left grade II invasive lobular carci1noma breast cancer. There are 2 areas: 2.6 cm in the upper outer quadrant and 6 mm in the upper inner quadrant. Both are ER/PR positive and HER2 negative with a Ki67 of 10% and she has a positive axillary node. Bilateral mastectomy on 06/13/2018 wiht 7 nodes removed and the 10 in second surgery on 07/09/2018  She had radiation and did well. She has had some recent problem with left hip starting in [place].  She has possible labral tear ?? she will received a steroid shot tomorrow     Patient Stated Goals  to be  able to get range of motion back in her arm and see what she can do to help her lymphatics     Currently in Pain?  No/denies         [facility] PT Assessment - 10/23/18 0001      Assessment   Medical Diagnosis  Left breast cancer    Referring Provider (PT)  Dr.Gudena     Onset Date/Surgical Date  04/25/18      Precautions   Precautions  None;Other (comment)    Precaution Comments  limited activity of left hip       Restrictions   Weight Bearing Restrictions  No      Balance Screen   Has the patient fallen in the past 6 months  No    Has the patient had a decrease in activity level because of a fear of falling?   No   limited by pain    Is the  patient reluctant to leave their home because of a fear of falling?   No      Home Environment   Living Environment  Private residence    Living Arrangements  Spouse/significant other    Available Help at Discharge  Family      Prior Function   Level of Independence  Independent    Vocation  Full time employment    Vocation Requirements  [employer] calls at [employer]    Leisure  limited exercise due to hip pain       Cognition   Overall Cognitive Status  Within Functional Limits for tasks assessed      Observation/Other Assessments   Observations  pt has darkened skin in radiation field on left upper quadrant. She has visible fullness in left chest  especially below incision, lateral chest, axilla and back     Skin Integrity  no open areas     Other Surveys   Other Surveys    Quick DASH   34.09      Observation/Other Assessments-Edema    Edema  Circumferential      Sensation   Light Touch  Impaired by gross assessment    Additional Comments  pt reports numbenss in lateral chest and axilla       Coordination   Gross Motor Movements are Fluid and Coordinated  Yes      Posture/Postural Control   Posture/Postural Control  Postural limitations    Postural Limitations  Rounded Shoulders;Forward head    Posture Comments  appears to have generalized muscle weakness       ROM / Strength   AROM / PROM / Strength  AROM;Strength      AROM   Overall AROM   Deficits    Overall AROM Comments  limited by pain in  left shoulder     Right Shoulder Flexion  178 Degrees    Right Shoulder ABduction  180 Degrees    Right Shoulder External Rotation  90 Degrees    Left Shoulder Flexion  148 Degrees    Left Shoulder ABduction  150 Degrees    Left Shoulder External Rotation  60 Degrees      Strength   Overall Strength  Deficits   left arm limited by pain/decreased scapular stabilization   Overall Strength Comments  appears to have decrased muscle tone generally       Palpation    Palpation comment  firm congestion felt in left anterior and lateral chest with mild pitting with pressure  LYMPHEDEMA/ONCOLOGY QUESTIONNAIRE - 10/23/18 1222      Type   Cancer Type  left breast cancer       Surgeries   Mastectomy Date  06/13/18    Other Surgery Date  07/09/18    Number Lymph Nodes Removed  17      Date Lymphedema/Swelling Started   Date  07/09/18      Treatment   Past Radiation Treatment  Yes    Date  09/25/18    Current Hormone Treatment  Yes    Drug Name  anastrazole      What other symptoms do you have   Are you Having Heaviness or Tightness  Yes    Are you having Pain  Yes    Are you having pitting edema  Yes    Is it Hard or Difficult finding clothes that fit  No    Do you have infections  No    Is there Decreased scar mobility  Yes    Stemmer Sign  No      Right Upper Extremity Lymphedema   10 cm Proximal to Olecranon Process  28 cm    Olecranon Process  23.5 cm    10 cm Proximal to Ulnar Styloid Process  21 cm    Just Proximal to Ulnar Styloid Process  14.5 cm    Across Hand at [employer]  18 cm    At [place] of 2nd Digit  5.5 cm      Left Upper Extremity Lymphedema   10 cm Proximal to Olecranon Process  26.5 cm    Olecranon Process  22.8 cm    15 cm Proximal to Ulnar Styloid Process  23 cm    10 cm Proximal to Ulnar Styloid Process  20.5 cm    Just Proximal to Ulnar Styloid Process  15 cm    Across Hand at [employer]  18 cm    At [place] of 2nd Digit  5.5 cm          Quick Dash - 10/23/18 0001    Open a tight or new jar  Mild difficulty    Do heavy household chores (wash walls, wash floors)  Moderate difficulty    Carry a shopping bag or briefcase  Mild difficulty    Wash your back  Moderate difficulty    Use a knife to cut food  No difficulty    Recreational activities in which you take some force or impact through your arm, shoulder, or hand (golf, hammering, tennis)  Moderate difficulty    During the past week, to  what extent has your arm, shoulder or hand problem interfered with your normal social activities with family, friends, neighbors, or groups?  Slightly    During the past week, to what extent has your arm, shoulder or hand problem limited your work or other regular daily activities  Slightly    Arm, shoulder, or hand pain.  Severe    Tingling (pins and needles) in your arm, shoulder, or hand  Mild    Difficulty Sleeping  Mild difficulty    DASH Score  34.09 %        Objective measurements completed on examination: See above findings.                PT Short Term Goals - 08/21/18 1701      PT SHORT TERM GOAL #1   Title  Pt will verbalize lymphedema risk reduction practices  Time  4    Status  On-going      PT SHORT TERM GOAL #2   Title  Pt will have 125 degrees if painless left shoulder abduction so that she can receive radiation     Time  4    Period  Weeks    Status  On-going      PT SHORT TERM GOAL #3   Title  Pt will be independent in a home exercise program for shoulder range of motion     Period  Weeks    Status  Achieved        PT Long Term Goals - 10/23/18 1254      PT LONG TERM GOAL #1   Title  Pt will report the pain and discomfort in her left upper quadrant is decreased by 50%     Time  4    Period  Weeks    Status  New      PT LONG TERM GOAL #2   Title  Pt will be independent in a home exercise program for strengthening     Time  4    Period  Weeks    Status  New      PT LONG TERM GOAL #3   Title  Pt will report she is able to manage her symptoms of lymphedema on her own at home    Time  4    Period  Weeks    Status  New      PT LONG TERM GOAL #4   Title  Pt will increase left shoulder painless abduction to 165 degress so she can have easier functional mobility     Baseline  150    Time  4    Period  Weeks    Status  New             Plan - 10/23/18 1227    Clinical Impression Statement  Pt returns to PT after having  completed radiation 09/25/2018 with pain, decreased range of motion and strength and lymphatic congestion in left anterior, lateral and posterior chest.  She has increased circumference measurements in her left arm today.  She has been wearing her compression sleeve and gauntlet intemittently and her compression camisoles have been on order and should be arriving soon.  I feel that she would benefit from a Flexitouch for intermittent compression as she has had chest and trunk congestion since surgery and it appears to be increasing and with lymphedema developing in her arm. 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     History and Personal Factors relevant to plan of care:  2 surgeries for breast cancer, radiation, hip pain limiting weight bearing exercise     Clinical Presentation  Evolving    Clinical Presentation due to:  ongoing evaluation of hip and pain, will have PET to rule out bone mets     Clinical Decision Making  Moderate    Rehab Potential  Good    Clinical Impairments Affecting Rehab Potential  17 nodes removed with second surgery needed for lymph node dissection , radiation     PT Frequency  2x / week    PT Treatment/Interventions  ADLs/Self Care Home Management;DME Instruction;Therapeutic activities;Therapeutic exercise;Orthotic Fit/Training;Patient/family education;Manual techniques;Neuromuscular re-education;Manual lymph drainage;Compression bandaging;Scar mobilization;Taping;Passive range of motion    PT Next Visit Plan  Manual lymph drainage to left upper quadrant and gentle ROM of left shoulder.  If she comes in wearing  sleeve, remeasure , check fit of compression camisole     Recommended Other Services  demographics sent to [employer] for benefit check 10/23/2018    Consulted and Agree with Plan of Care  Patient       Patient will benefit from skilled therapeutic intervention in order to improve the following deficits and impairments:  Decreased knowledge of  precautions, Pain, Impaired UE functional use, Decreased range of motion, Postural dysfunction, Decreased skin integrity, Increased fascial restricitons, Decreased scar mobility, Impaired perceived functional ability, Decreased strength, Decreased activity tolerance, Increased edema  Visit Diagnosis: Stiffness of left shoulder joint - Plan: PT plan of care cert/re-cert  Abnormal posture - Plan: PT plan of care cert/re-cert  Muscle weakness (generalized) - Plan: PT plan of care cert/re-cert  Disorder of the skin and subcutaneous tissue related to radiation, unspecified - Plan: PT plan of care cert/re-cert  Postmastectomy lymphedema - Plan: PT plan of care cert/re-cert     Problem List Patient Active Problem List   Diagnosis Date Noted  . Cancer of left female breast  ([place]) 06/13/2018  . Malignant neoplasm of overlapping sites of left breast in female, estrogen receptor positive ([place]) 05/09/2018  . Blurring of visual image of both eyes 12/06/2017  . Postherpetic neuralgia 12/06/2017  . BIPOLAR DISORDER UNSPECIFIED 10/10/2009  . ESOPHAGEAL REFLUX 10/10/2009   Donato Heinz. Owens Shark PT  Norwood Levo 10/23/2018, 12:58 PM  [place], [place], [address] Phone: [phone]   Fax:  [phone]  Name: ELONI DARIUS MRN: [id number] Date of Birth: 1956/03/11

## 2018-10-24 DIAGNOSIS — M1612 Unilateral primary osteoarthritis, left hip: Secondary | ICD-10-CM | POA: Diagnosis not present

## 2018-10-24 DIAGNOSIS — M25552 Pain in left hip: Secondary | ICD-10-CM | POA: Diagnosis not present

## 2018-10-24 MED FILL — GABAPENTIN 600 MG TABLET: 600 | 30 days supply | Qty: 90 | Fill #0

## 2018-10-27 ENCOUNTER — Ambulatory Visit: Payer: 59

## 2018-10-27 DIAGNOSIS — I972 Postmastectomy lymphedema syndrome: Secondary | ICD-10-CM

## 2018-10-27 DIAGNOSIS — R293 Abnormal posture: Secondary | ICD-10-CM

## 2018-10-27 DIAGNOSIS — L599 Disorder of the skin and subcutaneous tissue related to radiation, unspecified: Secondary | ICD-10-CM | POA: Diagnosis not present

## 2018-10-27 DIAGNOSIS — M25612 Stiffness of left shoulder, not elsewhere classified: Secondary | ICD-10-CM

## 2018-10-27 DIAGNOSIS — M6281 Muscle weakness (generalized): Secondary | ICD-10-CM

## 2018-10-27 NOTE — Therapy (Signed)
[facility] [address] [place], [place], [address] Phone: [phone]   Fax:  [phone]  Physical Therapy Treatment  Patient Details  Name: Dawn Booker MRN: [id number] Date of Birth: 06/13/1956 Referring Provider (PT): Dr.Gudena    Encounter Date: 10/27/2018  PT End of Session - 10/27/18 0849    Visit Number  2    Number of Visits  9    Date for PT Re-Evaluation  11/21/18    PT Start Time  0815   pt arrived at wrong appt time so seen for shortened time today   PT Stop Time  0846    PT Time Calculation (min)  31 min    Activity Tolerance  Patient tolerated treatment well    Behavior During Therapy  WFL for tasks assessed/performed       Past Medical History:  Diagnosis Date  . ADD (attention deficit disorder)   . Anxiety   . Cancer ([facility])    breast cancer  . Depression   . GERD (gastroesophageal reflux disease)   . Hiatal hernia   . Hyperlipemia, mixed   . Intermittent palpitations   . Malignant neoplasm of overlapping sites of left breast in female, estrogen receptor positive ([facility]) 05/06/2018   oncologist-  dr gudena--  Invasive Lobular Cancer, CIS, Stage IB, Grade 2 (pT3,pN1a,cM0), ER+---  s/p  left mastectomy with sln dissections and right mastectomy (benign)  . PONV (postoperative nausea and vomiting)   . Right thyroid nodule   . Shingles 12/2017    Past Surgical History:  Procedure Laterality Date  . AXILLARY LYMPH NODE DISSECTION Left 07/09/2018   Procedure: COMPLETION OF LEFT AXILLARY LYMPH NODE DISSECTION;  Surgeon: Toth, Paul III, MD;  Location: [facility];  Service: General;  Laterality: Left;  . BREAST EXCISIONAL BIOPSY Left 1995   benign  . CARDIOVASCULAR STRESS TEST  06/21/2017   normal nuclear stress study w/ no ischemia/  normal LV function and wall function, nuclear stress ef 70%  . ELBOW SURGERY Left child  . MASTECTOMY W/ SENTINEL NODE BIOPSY Left 06/13/2018  . MASTECTOMY WITH RADIOACTIVE  SEED GUIDED EXCISION AND AXILLARY SENTINEL LYMPH NODE BIOPSY Bilateral 06/13/2018   Procedure: LEFT MASTECTOMY WITH SENTINEL LYMPH NODE MAPPING AND TARGETED NODE DISSECTION AND RIGHT PROPHYLATIC MASTECTOMY;  Surgeon: Toth, Paul III, MD;  [facility];  Service: General;  Laterality: Bilateral;  . THYROIDECTOMY Left 10-31-2001   dr gerkin @[facility]  . TRANSTHORACIC ECHOCARDIOGRAM  06/21/2017   ef 60-65%/  trivial MR (no evidence mvp)  . TUBAL LIGATION  yrs ago  . VAGINAL HYSTERECTOMY  1990s    There were no vitals filed for this visit.  Subjective Assessment - 10/27/18 0820    Subjective  My Lt shoulder really hurts when I lay down , I get a sharp, shooting pain. But then the pain goes away again    Pertinent History  Patient was diagnosed on 04/25/18 with left grade II invasive lobular carci1noma breast cancer. There are 2 areas: 2.6 cm in the upper outer quadrant and 6 mm in the upper inner quadrant. Both are ER/PR positive and HER2 negative with a Ki67 of 10% and she has a positive axillary node. Bilateral mastectomy on 06/13/2018 wiht 7 nodes removed and the 10 in second surgery on 07/09/2018  She had radiation and did well. She has had some recent problem with left hip starting in [place].  She has possible labral tear ?? she will received a steroid shot tomorrow       Patient Stated Goals  to be able to get range of motion back in her arm and see what she can do to help her lymphatics     Currently in Pain?  No/denies                       [facility] Adult PT Treatment/Exercise - 10/27/18 0001      Manual Therapy   Manual Therapy  Passive ROM;Manual Lymphatic Drainage (MLD);Neural Stretch    Manual Lymphatic Drainage (MLD)  In Supine: Short neck, 5 diaphragmatic breaths, L tinguinal nodes (gently due to still tender from Fridays injection), and then Lt axillo-inguinal anastomosis focusing on Lt hcest wall, and Lt upper arm all redirecting towards anastomosis.     Passive ROM  In  Supine to Lt shoulder into flexion, abduction and D2 to tolerance. Instructed pt throughout how to incorporate stretches into ADLs.                PT Short Term Goals - 07/22/18 1750      PT SHORT TERM GOAL #1   Title  Pt will verbalize lymphedema risk reduction practices     Time  4    Period  Weeks    Status  New      PT SHORT TERM GOAL #2   Title  Pt will have 125 degrees if painless left shoulder abduction so that she can receive radiation     Time  4    Period  Weeks    Status  New      PT SHORT TERM GOAL #3   Title  Pt will be independent in a home exercise program for shoulder range of motion     Time  4    Period  Weeks        PT Long Term Goals - 10/23/18 1254      PT LONG TERM GOAL #1   Title  Pt will report the pain and discomfort in her left upper quadrant is decreased by 50%     Time  4    Period  Weeks    Status  New      PT LONG TERM GOAL #2   Title  Pt will be independent in a home exercise program for strengthening     Time  4    Period  Weeks    Status  New      PT LONG TERM GOAL #3   Title  Pt will report she is able to manage her symptoms of lymphedema on her own at home    Time  4    Period  Weeks    Status  New      PT LONG TERM GOAL #4   Title  Pt will increase left shoulder painless abduction to 165 degress so she can have easier functional mobility     Baseline  150    Time  4    Period  Weeks    Status  New            Plan - 10/27/18 7829    Clinical Impression Statement  Pt arrived at wrong appt time so was able to see her for shortened treatment today. Focused on manual therapy today for first session since evaluation. Pt tolerated this very well and reported feeling looser by end of session.     Rehab Potential  Good    Clinical Impairments Affecting Rehab Potential  17 nodes removed  with second surgery needed for lymph node dissection , radiation     PT Frequency  2x / week    PT Duration  6 weeks    PT  Treatment/Interventions  ADLs/Self Care Home Management;DME Instruction;Therapeutic activities;Therapeutic exercise;Orthotic Fit/Training;Patient/family education;Manual techniques;Neuromuscular re-education;Manual lymph drainage;Compression bandaging;Scar mobilization;Taping;Passive range of motion    PT Next Visit Plan  Manual lymph drainage to left upper quadrant and gentle ROM of left shoulder.  If she comes in wearing sleeve, remeasure , check fit of compression camisole     Consulted and Agree with Plan of Care  Patient       Patient will benefit from skilled therapeutic intervention in order to improve the following deficits and impairments:  Decreased knowledge of precautions, Pain, Impaired UE functional use, Decreased range of motion, Postural dysfunction, Decreased skin integrity, Increased fascial restricitons, Decreased scar mobility, Impaired perceived functional ability, Decreased strength, Decreased activity tolerance, Increased edema  Visit Diagnosis: Stiffness of left shoulder joint  Abnormal posture  Muscle weakness (generalized)  Disorder of the skin and subcutaneous tissue related to radiation, unspecified  Postmastectomy lymphedema     Problem List Patient Active Problem List   Diagnosis Date Noted  . Cancer of left female breast  ([place]) 06/13/2018  . Malignant neoplasm of overlapping sites of left breast in female, estrogen receptor positive ([place]) 05/09/2018  . Blurring of visual image of both eyes 12/06/2017  . Postherpetic neuralgia 12/06/2017  . BIPOLAR DISORDER UNSPECIFIED 10/10/2009  . ESOPHAGEAL REFLUX 10/10/2009    Otelia Limes, PTA 10/27/2018, 9:52 AM  [place], [place], [address] Phone: [phone]   Fax:  [phone]  Name: Dawn Booker MRN: [id number] Date of Birth: 01-23-1956

## 2018-10-28 ENCOUNTER — Ambulatory Visit: Payer: Self-pay | Admitting: Radiation Oncology

## 2018-10-28 ENCOUNTER — Ambulatory Visit (HOSPITAL_COMMUNITY)
Admission: RE | Admit: 2018-10-28 | Discharge: 2018-10-28 | Disposition: A | Discharge status: Home / Self Care | Payer: 59 | Source: Ambulatory Visit | Attending: Hematology and Oncology | Admitting: Hematology and Oncology

## 2018-10-28 ENCOUNTER — Encounter: Payer: Self-pay | Admitting: Radiation Oncology

## 2018-10-28 DIAGNOSIS — R937 Abnormal findings on diagnostic imaging of other parts of musculoskeletal system: Secondary | ICD-10-CM | POA: Insufficient documentation

## 2018-10-28 DIAGNOSIS — G959 Disease of spinal cord, unspecified: Secondary | ICD-10-CM | POA: Diagnosis not present

## 2018-10-28 LAB — GLUCOSE, CAPILLARY: Glucose-Capillary: 106 mg/dL — ABNORMAL HIGH (ref 70–99)

## 2018-10-28 MED ORDER — FLUDEOXYGLUCOSE F - 18 (FDG) INJECTION
7.6400 | Freq: Once | INTRAVENOUS | Status: AC | PRN
  Administered 2018-10-28: 7.64 via INTRAVENOUS

## 2018-10-28 NOTE — Progress Notes (Signed)
Ms. Rayon presents for follow up of radiation completed 09/25/18 to her left chest wall, IM nodes, left supraclavicular fossa, and PAB. She reports her skin has healed well. She is using sonafine daily. She will switch to a vitamin E containing lotion. She reports she has fatigue mostly in the afternoon.   BP (!) 144/77   Pulse 76   Temp 98 F (36.7 C) (Oral)   Resp 20   Ht 5\' 5"  (1.651 m)   Wt 154 lb 3.2 oz (69.9 kg)   SpO2 100% Comment: room air  BMI 25.66 kg/m    Wt Readings from Last 3 Encounters:  10/31/18 154 lb 3.2 oz (69.9 kg)  09/17/18 156 lb 6.4 oz (70.9 kg)  08/06/18 154 lb 3.2 oz (69.9 kg)

## 2018-10-29 ENCOUNTER — Telehealth: Payer: Self-pay | Admitting: Hematology and Oncology

## 2018-10-29 MED FILL — TEMAZEPAM 30 MG CAPSULE: 30 | 30 days supply | Qty: 30 | Fill #1

## 2018-10-29 NOTE — Telephone Encounter (Signed)
I informed the patient that the PET CT does not show any evidence of metastatic disease. Patient would like to switch from anastrozole to tamoxifen because of bone health issues. She would like to come and meet with me and discuss the treatment plan. I will request for an appointment next week for her.

## 2018-10-30 ENCOUNTER — Telehealth: Payer: Self-pay | Admitting: Hematology and Oncology

## 2018-10-30 NOTE — Telephone Encounter (Signed)
Scheduled appt per 2/26 sch message - pt is aware of appt date and time   

## 2018-10-31 ENCOUNTER — Other Ambulatory Visit: Payer: Self-pay

## 2018-10-31 ENCOUNTER — Ambulatory Visit
Admission: RE | Admit: 2018-10-31 | Discharge: 2018-10-31 | Disposition: A | Discharge status: Home / Self Care | Payer: 59 | Source: Ambulatory Visit | Attending: Radiation Oncology | Admitting: Radiation Oncology

## 2018-10-31 ENCOUNTER — Encounter: Payer: Self-pay | Admitting: Physical Therapy

## 2018-10-31 ENCOUNTER — Ambulatory Visit: Payer: 59 | Admitting: Physical Therapy

## 2018-10-31 ENCOUNTER — Encounter: Payer: Self-pay | Admitting: Radiation Oncology

## 2018-10-31 DIAGNOSIS — Z79899 Other long term (current) drug therapy: Secondary | ICD-10-CM | POA: Insufficient documentation

## 2018-10-31 DIAGNOSIS — M25612 Stiffness of left shoulder, not elsewhere classified: Secondary | ICD-10-CM

## 2018-10-31 DIAGNOSIS — Z923 Personal history of irradiation: Secondary | ICD-10-CM | POA: Diagnosis not present

## 2018-10-31 DIAGNOSIS — Z170 Estrogen receptor positive status [ER+]: Secondary | ICD-10-CM | POA: Diagnosis not present

## 2018-10-31 DIAGNOSIS — M6281 Muscle weakness (generalized): Secondary | ICD-10-CM | POA: Diagnosis not present

## 2018-10-31 DIAGNOSIS — C50812 Malignant neoplasm of overlapping sites of left female breast: Secondary | ICD-10-CM | POA: Diagnosis not present

## 2018-10-31 DIAGNOSIS — L599 Disorder of the skin and subcutaneous tissue related to radiation, unspecified: Secondary | ICD-10-CM | POA: Diagnosis not present

## 2018-10-31 DIAGNOSIS — I972 Postmastectomy lymphedema syndrome: Secondary | ICD-10-CM | POA: Diagnosis not present

## 2018-10-31 DIAGNOSIS — R293 Abnormal posture: Secondary | ICD-10-CM | POA: Diagnosis not present

## 2018-10-31 HISTORY — DX: Personal history of irradiation: Z92.3

## 2018-10-31 NOTE — Progress Notes (Signed)
Radiation Oncology         [phone]) [phone] ________________________________  Name: Dawn Booker MRN: [id number]  Date: 10/31/2018  DOB: 06-17-1956  Follow-Up Visit Note  Outpatient  CC: Lawerance Cruel, MD  Nicholas Lose, MD  Diagnosis and Prior Radiotherapy:    ICD-10-CM   1. Malignant neoplasm of overlapping sites of left breast in female, estrogen receptor positive [facility]) C50.812    Z17.0     Cancer Staging Malignant neoplasm of overlapping sites of left breast in female, estrogen receptor positive ([place]) Staging form: Breast, AJCC 8th Edition - Clinical stage from 05/21/2018: Stage IIA (cT2, cN1, cM0, G2, ER+, PR+, HER2-) - Unsigned - Pathologic: Stage IB (pT3, pN1a, cM0, G2, ER+, PR+, HER2-) - Signed by Nicholas Lose, MD on 06/24/2018   Radiation treatment dates:   08/13/2018 - 09/25/2018  Site/dose:    1) Left Chest Wall and IM nodes/ 50 Gy in 25 fractions 2) Left Supraclavicular fossa and PAB/ 50 Gy in 25 fractions 3) Left Chest Wall Scar boost / 10 Gy in 5 fractions  CHIEF COMPLAINT: Here for follow-up and surveillance of left breast cancer  Narrative:  The patient returns today for routine follow-up of radiation completion to the left chest wall and SCV fossa on 09/25/2018. She is back at work now, which is going okay at this time, however, she doesn't have her strength at this time due to not being able to exercise. She is currently in PT now. She is still using what was given to her to help with her skin following radiation.   PET scan on 10/28/2018 showed: No hypermetabolism within the L4-L5 vertebral bodies to correspond to MRI findings. This supports benign etiology. No evidence of recurrence or metastatic disease elsewhere on skull base to thigh FDG PET scan                             ALLERGIES:  is allergic to lamictal [lamotrigine]; hydrocodone; mobic [meloxicam]; sulfa antibiotics; brintellix [vortioxetine]; corticosteroids; morphine; other; prednisone; and  zoloft [sertraline].  Meds: Current Outpatient Medications  Medication Sig Dispense Refill  . acetaminophen (TYLENOL) 500 MG tablet Take 1,000 mg by mouth at bedtime.     Marland Kitchen buPROPion (WELLBUTRIN XL) 150 MG 24 hr tablet Take 150 mg by mouth daily.     . Cholecalciferol (VITAMIN D) 2000 units CAPS Take 2,000 Units by mouth daily.     . diphenhydrAMINE (BENADRYL) 25 mg capsule Take 25 mg by mouth at bedtime.    . folic acid (FOLVITE) 650 MCG tablet Take 400 mcg by mouth daily.    Marland Kitchen gabapentin (NEURONTIN) 300 MG capsule 2 capsules 3 times daily, 3 capsules at night (Patient taking differently: Take 600 mg by mouth every evening. 600 mg in the evening) 270 capsule 5  . LORazepam (ATIVAN) 0.5 MG tablet Take 1 tablet (0.5 mg total) by mouth every 8 (eight) hours as needed for anxiety. 30 tablet 0  . omeprazole (PRILOSEC) 40 MG capsule Take 40 mg by mouth daily.  4  . rosuvastatin (CRESTOR) 10 MG tablet Take 10 mg by mouth daily.    . temazepam (RESTORIL) 30 MG capsule Take 30 mg by mouth at bedtime.     . Turmeric Curcumin 500 MG CAPS Take by mouth.    Marland Kitchen anastrozole (ARIMIDEX) 1 MG tablet Take 1 tablet (1 mg total) by mouth daily. (Patient not taking: Reported on 10/23/2018) 90 tablet 3  .  HYDROcodone-acetaminophen (NORCO/VICODIN) 5-325 MG tablet Take 1-2 tablets by mouth every 6 (six) hours as needed for moderate pain or severe pain. (Patient not taking: Reported on 10/23/2018) 15 tablet 0  . hydrOXYzine (VISTARIL) 25 MG capsule Take 25 mg by mouth as needed for anxiety.    . promethazine (PHENERGAN) 25 MG tablet Take 25 mg by mouth every 6 (six) hours as needed. for nausea  0  . rosuvastatin (CRESTOR) 10 MG tablet Take 1 tablet (10 mg total) by mouth daily. 30 tablet 11  . traMADol (ULTRAM) 50 MG tablet Take 1-2 tablets (50-100 mg total) by mouth every 6 (six) hours as needed. (Patient not taking: Reported on 10/23/2018) 20 tablet 1   No current facility-administered medications for this encounter.       Physical Findings: The patient is in no acute distress. Patient is alert and oriented.  height is '5\' 5"'  (1.651 m) and weight is 154 lb 3.2 oz (69.9 kg). Her oral temperature is 98 F (36.7 C). Her blood pressure is 144/77 (abnormal) and her pulse is 76. Her respiration is 20 and oxygen saturation is 100%.  Satisfactory skin healing in radiotherapy fields.  Very faint residual hyperpigmentation over the left chest. The other areas have no obvious skin changes that remain.       Lab Findings: Lab Results  Component Value Date   WBC 6.4 07/04/2018   HGB 12.6 07/04/2018   HCT 39.9 07/04/2018   MCV 98.5 07/04/2018   PLT 202 07/04/2018    Radiographic Findings: Nm Pet Image Initial (pi) Skull Base To Thigh  Result Date: 10/29/2018 CLINICAL DATA:  Initial treatment strategy for lumbar spine lesions suspicious for metastatic disease. History of LEFT breast cancer. EXAM: NUCLEAR MEDICINE PET SKULL BASE TO THIGH TECHNIQUE: 7.6 mCi F-18 FDG was injected intravenously. Full-ring PET imaging was performed from the skull base to thigh after the radiotracer. CT data was obtained and used for attenuation correction and anatomic localization. Fasting blood glucose: 106 mg/dl COMPARISON:  Lumbar MRI 10/09/2018 FINDINGS: Mediastinal blood pool activity: SUV max 29 NECK: No hypermetabolic lymph nodes in the neck. Incidental CT findings: none CHEST: No hypermetabolic mediastinal or hilar nodes. No suspicious pulmonary nodules on the CT scan. Incidental CT findings: Bilateral mastectomy anatomy ABDOMEN/PELVIS: No abnormal hypermetabolic activity within the liver, pancreas, adrenal glands, or spleen. No hypermetabolic lymph nodes in the abdomen or pelvis. Incidental CT findings: Post hysterectomy.  Benign hepatic cysts. SKELETON: There is no abnormal radiotracer within the L4-L5 vertebral body to correspond to the MRI findings. No discrete lesion on the CT portion exam. Incidental CT findings: none IMPRESSION:  1. No hypermetabolism within the L4-L5 vertebral bodies to correspond to MRI findings. This supports benign etiology. Recommend follow-up MRI with and without contrast in 6-9 months as recommended on MRI 10/09/2018. 2. No evidence of recurrence or metastatic disease elsewhere on skull base to thigh FDG PET scan. Electronically Signed   By: Suzy Bouchard M.D.   On: 10/29/2018 12:07    Impression/Plan: Healing well from radiotherapy to the breast tissue.  Continue skin care with topical Vitamin E Oil and / or lotion for at least 2 more months for further healing.  I encouraged her to continue followup with medical oncology. I will see her back on an as-needed basis. I have encouraged her to call if she has any issues or concerns in the future. I wished her the very best.  _____________________________________   Eppie Gibson, MD  This document serves as  a record of services personally performed by Eppie Gibson, MD. It was created on her behalf by Steva Colder, a trained medical scribe. The creation of this record is based on the scribe's personal observations and the provider's statements to them. This document has been checked and approved by the attending provider.

## 2018-10-31 NOTE — Therapy (Signed)
[place], [place], [address] Phone: [phone]   Fax:  [phone]  Physical Therapy Treatment  Patient Details  Name: Dawn Booker MRN: [id number] Date of Birth: 23-Feb-1956 Referring Provider (PT): Dr.Gudena    Encounter Date: 10/31/2018  PT End of Session - 10/31/18 0933    Visit Number  3    Number of Visits  9    Date for PT Re-Evaluation  11/21/18    PT Start Time  0850    PT Stop Time  0932    PT Time Calculation (min)  42 min    Activity Tolerance  Patient tolerated treatment well    Behavior During Therapy  [facility] for tasks assessed/performed       Past Medical History:  Diagnosis Date  . ADD (attention deficit disorder)   . Anxiety   . Cancer [facility])    breast cancer  . Depression   . GERD (gastroesophageal reflux disease)   . Hiatal hernia   . History of radiation therapy 08/13/2018- 09/25/2018   Left Chest wall and IM nodes/ 50 Gy in 25 fractions, Left supraclaicular fossa and PAB/ 50 Gy in 25 fractions, Left chest wall scar boost/ 10 Gy in 5 fractions.   . Hyperlipemia, mixed   . Intermittent palpitations   . Malignant neoplasm of overlapping sites of left breast in female, estrogen receptor positive ([place]) 05/06/2018   oncologist-  dr Lindi Adie--  Invasive Lobular Cancer, CIS, Stage IB, Grade 2 (pT3,pN1a,cM0), ER+---  s/p  left mastectomy with sln dissections and right mastectomy (benign)  . PONV (postoperative nausea and vomiting)   . Right thyroid nodule   . Shingles 12/2017    Past Surgical History:  Procedure Laterality Date  . AXILLARY LYMPH NODE DISSECTION Left 07/09/2018   Procedure: COMPLETION OF LEFT AXILLARY LYMPH NODE DISSECTION;  Surgeon: Jovita Kussmaul, MD;  Location: [facility];  Service: General;  Laterality: Left;  . BREAST EXCISIONAL BIOPSY Left 1995   benign  . CARDIOVASCULAR STRESS TEST  06/21/2017   normal nuclear stress study w/ no ischemia/  normal LV function  and wall function, nuclear stress ef 70%  . ELBOW SURGERY Left child  . MASTECTOMY W/ SENTINEL NODE BIOPSY Left 06/13/2018  . MASTECTOMY WITH RADIOACTIVE SEED GUIDED EXCISION AND AXILLARY SENTINEL LYMPH NODE BIOPSY Bilateral 06/13/2018   Procedure: LEFT MASTECTOMY WITH SENTINEL LYMPH NODE MAPPING AND TARGETED NODE DISSECTION AND RIGHT PROPHYLATIC MASTECTOMY;  Surgeon: Jovita Kussmaul, MD;  Location: [place];  Service: General;  Laterality: Bilateral;  . THYROIDECTOMY Left 10-31-2001   dr gerkin _0   . TRANSTHORACIC ECHOCARDIOGRAM  06/21/2017   ef 60-65%/  trivial MR (no evidence mvp)  . TUBAL LIGATION  yrs ago  . VAGINAL HYSTERECTOMY  1990s    There were no vitals filed for this visit.  Subjective Assessment - 10/31/18 0851    Subjective  I am doing really good again. Every once in a while it will feel like fullness.     Pertinent History  Patient was diagnosed on 04/25/18 with left grade II invasive lobular carci1noma breast cancer. There are 2 areas: 2.6 cm in the upper outer quadrant and 6 mm in the upper inner quadrant. Both are ER/PR positive and HER2 negative with a Ki67 of 10% and she has a positive axillary node. Bilateral mastectomy on 06/13/2018 wiht 7 nodes removed and the 10 in second surgery on 07/09/2018  She had radiation and did well. She has had  some recent problem with left hip starting in [place].  She has possible labral tear ?? she will received a steroid shot tomorrow     Patient Stated Goals  to be able to get range of motion back in her arm and see what she can do to help her lymphatics     Currently in Pain?  No/denies    Pain Score  0-No pain                       [facility] Adult PT Treatment/Exercise - 10/31/18 0001      Shoulder Exercises: Stretch   Corner Stretch  1 rep;20 seconds   pt returned therapis demo     Manual Therapy   Manual Therapy  Soft tissue mobilization;Myofascial release;Passive ROM    Soft tissue mobilization  to left pec while  moving LUE into abduction to help decrease tightness    Myofascial Release  across left pec    Passive ROM  In Supine to Lt shoulder into flexion, abduction and D2 to tolerance. Instructed pt throughout how to incorporate stretches into ADLs.                PT Short Term Goals - 07/22/18 1750      PT SHORT TERM GOAL #1   Title  Pt will verbalize lymphedema risk reduction practices     Time  4    Period  Weeks    Status  New      PT SHORT TERM GOAL #2   Title  Pt will have 125 degrees if painless left shoulder abduction so that she can receive radiation     Time  4    Period  Weeks    Status  New      PT SHORT TERM GOAL #3   Title  Pt will be independent in a home exercise program for shoulder range of motion     Time  4    Period  Weeks        PT Long Term Goals - 10/31/18 9326      PT LONG TERM GOAL #1   Title  Pt will report the pain and discomfort in her left upper quadrant is decreased by 50%     Baseline  10/31/18- 95%    Time  4    Period  Weeks    Status  Achieved      PT LONG TERM GOAL #2   Title  Pt will be independent in a home exercise program for strengthening     Time  4    Period  Weeks    Status  On-going      PT LONG TERM GOAL #3   Title  Pt will report she is able to manage her symptoms of lymphedema on her own at home    Baseline  10/31/18- pt does not feel independent with this     Time  4    Period  Weeks      PT LONG TERM GOAL #4   Title  Pt will increase left shoulder painless abduction to 165 degress so she can have easier functional mobility     Baseline  150, 10/31/18 - 155    Time  4    Period  Weeks    Status  On-going            Plan - 10/31/18 0936    Clinical Impression Statement  Pt has increased  tightness in left pec. She is barely able to tolerate full horizontal abduction at 90 degrees due to tightness. Performed manual work today to decrease it including soft tissue mobilization and myofascial release. Pt had a  lot of discomfort with this but was able to tolerate and by end of session she was able to demonstrate more pure abduction with less pec tightness.     Rehab Potential  Good    Clinical Impairments Affecting Rehab Potential  17 nodes removed with second surgery needed for lymph node dissection , radiation     PT Frequency  2x / week    PT Duration  6 weeks    PT Next Visit Plan  give more pec stretches, continued manual therapy to left pec, Manual lymph drainage to left upper quadrant and gentle ROM of left shoulder.  If she comes in wearing sleeve, remeasure , check fit of compression camisole     PT Home Exercise Plan  Post op shoulder ROM HEP, corner stretch    Consulted and Agree with Plan of Care  Patient       Patient will benefit from skilled therapeutic intervention in order to improve the following deficits and impairments:  Decreased knowledge of precautions, Pain, Impaired UE functional use, Decreased range of motion, Postural dysfunction, Decreased skin integrity, Increased fascial restricitons, Decreased scar mobility, Impaired perceived functional ability, Decreased strength, Decreased activity tolerance, Increased edema  Visit Diagnosis: Stiffness of left shoulder joint  Abnormal posture     Problem List Patient Active Problem List   Diagnosis Date Noted  . Cancer of left female breast  ([place]) 06/13/2018  . Malignant neoplasm of overlapping sites of left breast in female, estrogen receptor positive ([place]) 05/09/2018  . Blurring of visual image of both eyes 12/06/2017  . Postherpetic neuralgia 12/06/2017  . BIPOLAR DISORDER UNSPECIFIED 10/10/2009  . ESOPHAGEAL REFLUX 10/10/2009    Allyson Sabal [facility] 10/31/2018, 9:38 AM  [place], [place], [address] Phone: [phone]   Fax:  [phone]  Name: Dawn Booker MRN: [id number] Date of Birth: 1956-03-02  Manus Gunning,  PT 10/31/18 9:38 AM

## 2018-11-04 ENCOUNTER — Other Ambulatory Visit: Payer: Self-pay

## 2018-11-04 ENCOUNTER — Ambulatory Visit: Payer: 59 | Admitting: Physical Therapy

## 2018-11-04 ENCOUNTER — Encounter: Payer: Self-pay | Admitting: Physical Therapy

## 2018-11-04 DIAGNOSIS — Z923 Personal history of irradiation: Secondary | ICD-10-CM | POA: Diagnosis not present

## 2018-11-04 DIAGNOSIS — Z79899 Other long term (current) drug therapy: Secondary | ICD-10-CM | POA: Diagnosis not present

## 2018-11-04 DIAGNOSIS — Z170 Estrogen receptor positive status [ER+]: Secondary | ICD-10-CM | POA: Diagnosis not present

## 2018-11-04 DIAGNOSIS — M6281 Muscle weakness (generalized): Secondary | ICD-10-CM

## 2018-11-04 DIAGNOSIS — M25612 Stiffness of left shoulder, not elsewhere classified: Secondary | ICD-10-CM | POA: Insufficient documentation

## 2018-11-04 DIAGNOSIS — C50812 Malignant neoplasm of overlapping sites of left female breast: Secondary | ICD-10-CM | POA: Diagnosis not present

## 2018-11-04 DIAGNOSIS — L599 Disorder of the skin and subcutaneous tissue related to radiation, unspecified: Secondary | ICD-10-CM | POA: Insufficient documentation

## 2018-11-04 DIAGNOSIS — I972 Postmastectomy lymphedema syndrome: Secondary | ICD-10-CM | POA: Insufficient documentation

## 2018-11-04 DIAGNOSIS — Z483 Aftercare following surgery for neoplasm: Secondary | ICD-10-CM | POA: Insufficient documentation

## 2018-11-04 DIAGNOSIS — Z9013 Acquired absence of bilateral breasts and nipples: Secondary | ICD-10-CM | POA: Diagnosis not present

## 2018-11-04 DIAGNOSIS — R293 Abnormal posture: Secondary | ICD-10-CM

## 2018-11-04 DIAGNOSIS — Z79811 Long term (current) use of aromatase inhibitors: Secondary | ICD-10-CM | POA: Diagnosis not present

## 2018-11-04 NOTE — Therapy (Signed)
[place], [place], [address] Phone: [phone]   Fax:  [phone]  Physical Therapy Treatment  Patient Details  Name: Dawn Booker MRN: [id number] Date of Birth: April 23, 1956 Referring Provider (PT): Dr.Gudena    Encounter Date: 11/04/2018  PT End of Session - 11/04/18 1110    Visit Number  4    Number of Visits  9    Date for PT Re-Evaluation  11/21/18    PT Start Time  0805    PT Stop Time  0849    PT Time Calculation (min)  44 min    Activity Tolerance  Patient tolerated treatment well    Behavior During Therapy  [facility] for tasks assessed/performed       Past Medical History:  Diagnosis Date  . ADD (attention deficit disorder)   . Anxiety   . Cancer [facility])    breast cancer  . Depression   . GERD (gastroesophageal reflux disease)   . Hiatal hernia   . History of radiation therapy 08/13/2018- 09/25/2018   Left Chest wall and IM nodes/ 50 Gy in 25 fractions, Left supraclaicular fossa and PAB/ 50 Gy in 25 fractions, Left chest wall scar boost/ 10 Gy in 5 fractions.   . Hyperlipemia, mixed   . Intermittent palpitations   . Malignant neoplasm of overlapping sites of left breast in female, estrogen receptor positive ([place]) 05/06/2018   oncologist-  dr Lindi Adie--  Invasive Lobular Cancer, CIS, Stage IB, Grade 2 (pT3,pN1a,cM0), ER+---  s/p  left mastectomy with sln dissections and right mastectomy (benign)  . PONV (postoperative nausea and vomiting)   . Right thyroid nodule   . Shingles 12/2017    Past Surgical History:  Procedure Laterality Date  . AXILLARY LYMPH NODE DISSECTION Left 07/09/2018   Procedure: COMPLETION OF LEFT AXILLARY LYMPH NODE DISSECTION;  Surgeon: Jovita Kussmaul, MD;  [facility];  Service: General;  Laterality: Left;  . BREAST EXCISIONAL BIOPSY Left 1995   benign  . CARDIOVASCULAR STRESS TEST  06/21/2017   normal nuclear stress study w/ no ischemia/  normal LV function  and wall function, nuclear stress ef 70%  . ELBOW SURGERY Left child  . MASTECTOMY W/ SENTINEL NODE BIOPSY Left 06/13/2018  . MASTECTOMY WITH RADIOACTIVE SEED GUIDED EXCISION AND AXILLARY SENTINEL LYMPH NODE BIOPSY Bilateral 06/13/2018   Procedure: LEFT MASTECTOMY WITH SENTINEL LYMPH NODE MAPPING AND TARGETED NODE DISSECTION AND RIGHT PROPHYLATIC MASTECTOMY;  Surgeon: Jovita Kussmaul, MD;  Location: [place];  Service: General;  Laterality: Bilateral;  . THYROIDECTOMY Left 10-31-2001   dr gerkin '@[facility]'   . TRANSTHORACIC ECHOCARDIOGRAM  06/21/2017   ef 60-65%/  trivial MR (no evidence mvp)  . TUBAL LIGATION  yrs ago  . VAGINAL HYSTERECTOMY  1990s    There were no vitals filed for this visit.  Subjective Assessment - 11/04/18 0807    Subjective  I got my sleeve and glove and I am wearing it now. They are coming to my house to trial the [place] on Friday.    Pertinent History  Patient was diagnosed on 04/25/18 with left grade II invasive lobular carci1noma breast cancer. There are 2 areas: 2.6 cm in the upper outer quadrant and 6 mm in the upper inner quadrant. Both are ER/PR positive and HER2 negative with a Ki67 of 10% and she has a positive axillary node. Bilateral mastectomy on 06/13/2018 wiht 7 nodes removed and the 10 in second surgery on 07/09/2018  She had  radiation and did well. She has had some recent problem with left hip starting in [place].  She has possible labral tear ?? she will received a steroid shot tomorrow     Patient Stated Goals  to be able to get range of motion back in her arm and see what she can do to help her lymphatics     Currently in Pain?  No/denies    Pain Score  0-No pain            LYMPHEDEMA/ONCOLOGY QUESTIONNAIRE - 11/04/18 0810      Left Upper Extremity Lymphedema   10 cm Proximal to Olecranon Process  26.4 cm    Olecranon Process  23 cm    15 cm Proximal to Ulnar Styloid Process  22.1 cm    10 cm Proximal to Ulnar Styloid Process  19.1 cm    Just  Proximal to Ulnar Styloid Process  15 cm    Across Hand at [employer]  18.2 cm    At [place] of 2nd Digit  5.7 cm                [facility] Adult PT Treatment/Exercise - 11/04/18 0001      Manual Therapy   Soft tissue mobilization  to left pec while moving LUE into abduction to help decrease tightness, in right sidelying to left lateral trunk and shoulder external rotators in area of tightness and discomfort    Myofascial Release  across left pec    Passive ROM  In Supine to Lt shoulder into flexion, abduction and D2 to tolerance               PT Short Term Goals - 07/22/18 1750      PT SHORT TERM GOAL #1   Title  Pt will verbalize lymphedema risk reduction practices     Time  4    Period  Weeks    Status  New      PT SHORT TERM GOAL #2   Title  Pt will have 125 degrees if painless left shoulder abduction so that she can receive radiation     Time  4    Period  Weeks    Status  New      PT SHORT TERM GOAL #3   Title  Pt will be independent in a home exercise program for shoulder range of motion     Time  4    Period  Weeks        PT Long Term Goals - 10/31/18 4562      PT LONG TERM GOAL #1   Title  Pt will report the pain and discomfort in her left upper quadrant is decreased by 50%     Baseline  10/31/18- 95%    Time  4    Period  Weeks    Status  Achieved      PT LONG TERM GOAL #2   Title  Pt will be independent in a home exercise program for strengthening     Time  4    Period  Weeks    Status  On-going      PT LONG TERM GOAL #3   Title  Pt will report she is able to manage her symptoms of lymphedema on her own at home    Baseline  10/31/18- pt does not feel independent with this     Time  4    Period  Weeks  PT LONG TERM GOAL #4   Title  Pt will increase left shoulder painless abduction to 165 degress so she can have easier functional mobility     Baseline  150, 10/31/18 - 155    Time  4    Period  Weeks    Status  On-going             Plan - 11/04/18 1110    Clinical Impression Statement  Remeasured circumference left UE today since pt has been wearing her compression sleeve. Overall her arm is reduced. Educated pt to wear her compression sleeve when she feels the discomfort in her upper arm to see fi that helps. Continued to focus on L shoulder PROM today and myofascial to L pec. Ended session with soft tissue mobilization to left lateral trunk and shoulder external rotators where pt was having tenderness in this area.     Rehab Potential  Good    Clinical Impairments Affecting Rehab Potential  17 nodes removed with second surgery needed for lymph node dissection , radiation     PT Frequency  2x / week    PT Duration  6 weeks    PT Treatment/Interventions  ADLs/Self Care Home Management;DME Instruction;Therapeutic activities;Therapeutic exercise;Orthotic Fit/Training;Patient/family education;Manual techniques;Neuromuscular re-education;Manual lymph drainage;Compression bandaging;Scar mobilization;Taping;Passive range of motion    PT Next Visit Plan  give more pec stretches, continued manual therapy to left pec, Manual lymph drainage to left upper quadrant and gentle ROM of left shoulder.  , check fit of compression camisole     PT Home Exercise Plan  Post op shoulder ROM HEP, corner stretch    Consulted and Agree with Plan of Care  Patient       Patient will benefit from skilled therapeutic intervention in order to improve the following deficits and impairments:  Decreased knowledge of precautions, Pain, Impaired UE functional use, Decreased range of motion, Postural dysfunction, Decreased skin integrity, Increased fascial restricitons, Decreased scar mobility, Impaired perceived functional ability, Decreased strength, Decreased activity tolerance, Increased edema  Visit Diagnosis: Stiffness of left shoulder joint  Abnormal posture     Problem List Patient Active Problem List   Diagnosis Date Noted  .  Cancer of left female breast  ([place]) 06/13/2018  . Malignant neoplasm of overlapping sites of left breast in female, estrogen receptor positive ([place]) 05/09/2018  . Blurring of visual image of both eyes 12/06/2017  . Postherpetic neuralgia 12/06/2017  . BIPOLAR DISORDER UNSPECIFIED 10/10/2009  . ESOPHAGEAL REFLUX 10/10/2009    Allyson Sabal [facility] 11/04/2018, 11:13 AM  [place], [place], [address] Phone: [phone]   Fax:  [phone]  Name: Dawn Booker MRN: [id number] Date of Birth: [birth year]  Manus Gunning, PT 11/04/18 11:13 AM

## 2018-11-05 NOTE — Progress Notes (Signed)
Patient Care Team: Lawerance Cruel, MD as PCP - General (Family Medicine) Nicholas Lose, MD as Consulting Physician (Hematology and Oncology) Jovita Kussmaul, MD as Consulting Physician (General Surgery) Eppie Gibson, MD as Attending Physician (Radiation Oncology)  DIAGNOSIS:    ICD-10-CM   1. Malignant neoplasm of overlapping sites of left breast in female, estrogen receptor positive (Dawn Booker) C50.812    Z17.0     SUMMARY OF ONCOLOGIC HISTORY:   Malignant neoplasm of overlapping sites of left breast in female, estrogen receptor positive (Dawn Booker)   05/06/2018 Initial Diagnosis    Screening detected left breast mass anteriorly 1030 to 11 o'clock position 2 masses 6 mm and 7 mm, 12:30 position 2.6 cm both of these masses biopsy-proven grade 2 invasive lobular cancer ER 100%, PR 10%, Ki-67 15%, HER-2 negative 1+ by IHC, lymph node positive for malignancy T2N1 stage IIa clinical stage    05/21/2018 Cancer Staging    Staging form: Breast, AJCC 8th Edition - Pathologic: Stage IB (pT3, pN1a, cM0, G2, ER+, PR+, HER2-) - Signed by Nicholas Lose, MD on 06/24/2018    06/03/2018 Miscellaneous    Genetics negative    06/13/2018 Surgery    Bilateral mastectomies: Left: ILC, grade 2, 5.4 cm, LCIS, margins negative, lymphovascular invasion present, 2/7 lymph nodes positive with extranodal extension (1 additional lymph node had isolated tumor cells), right mastectomy benign; T3N1a ER 100%, PR 10%, HER-2 -1+, Ki-67 10 to 15%, stage Ib    08/06/2018 - 09/25/2018 Radiation Therapy    Adjuvant XRT    10/09/2018 -  Anti-estrogen oral therapy    Anastrozole 87m daily, plan for 7 years; switched to tamoxifen 11/07/18 for bone health     CHIEF COMPLIANT: Follow-up of anastrozole therapy and recent PET scan  INTERVAL HISTORY: Dawn HOLSTERis a 63y.o. with above-mentioned history of left breast cancer who underwent bilateral mastectomies and radiation, and is currently on anti-estrogen therapy with anastrozole.  An MRI of her spine done by Dr. WPara Marchshowed L4 and L5 lesions that could not be ruled out for metastatic disease, but a PET scan on 10/29/18 showed the lesions to be benign. She presents to the clinic today with her daughter to discuss switching from anastrozole to tamoxifen for bone health. She has several concerns about the side effects of tamoxifen. Her most recent bone density scan from 04/25/18 showed a T-score of -3.1. She reports a cartilage tear in her left groin that prevents her from exercising. She requested to start with half a tablet for the first week and then take the full dose.   REVIEW OF SYSTEMS:   Constitutional: Denies fevers, chills or abnormal weight loss Eyes: Denies blurriness of vision Ears, nose, mouth, throat, and face: Denies mucositis or sore throat Respiratory: Denies cough, dyspnea or wheezes Cardiovascular: Denies palpitation, chest discomfort Gastrointestinal: Denies nausea, heartburn or change in bowel habits Skin: Denies abnormal skin rashes Lymphatics: Denies new lymphadenopathy or easy bruising Neurological: Denies numbness, tingling or new weaknesses Behavioral/Psych: Mood is stable, no new changes  Extremities: No lower extremity edema Breast: denies any pain or lumps or nodules in either breasts All other systems were reviewed with the patient and are negative.  I have reviewed the past medical history, past surgical history, social history and family history with the patient and they are unchanged from previous note.  ALLERGIES:  is allergic to lamictal [lamotrigine]; hydrocodone; mobic [meloxicam]; sulfa antibiotics; brintellix [vortioxetine]; corticosteroids; morphine; other; prednisone; and zoloft [sertraline].  MEDICATIONS:  Current Outpatient Medications  Medication Sig Dispense Refill  . acetaminophen (TYLENOL) 500 MG tablet Take 1,000 mg by mouth at bedtime.     Marland Kitchen anastrozole (ARIMIDEX) 1 MG tablet Take 1 tablet (1 mg total) by mouth daily.  (Patient not taking: Reported on 63/20/2020) 90 tablet 3  . buPROPion (WELLBUTRIN XL) 150 MG 24 hr tablet Take 150 mg by mouth daily.     . Cholecalciferol (VITAMIN D) 2000 units CAPS Take 2,000 Units by mouth daily.     . diphenhydrAMINE (BENADRYL) 25 mg capsule Take 25 mg by mouth at bedtime.    . folic acid (FOLVITE) 448 MCG tablet Take 400 mcg by mouth daily.    Marland Kitchen gabapentin (NEURONTIN) 300 MG capsule 2 capsules 3 times daily, 3 capsules at night (Patient taking differently: Take 600 mg by mouth every evening. 600 mg in the evening) 270 capsule 5  . HYDROcodone-acetaminophen (NORCO/VICODIN) 5-325 MG tablet Take 1-2 tablets by mouth every 6 (six) hours as needed for moderate pain or severe pain. (Patient not taking: Reported on 63/20/2020) 15 tablet 0  . hydrOXYzine (VISTARIL) 25 MG capsule Take 25 mg by mouth as needed for anxiety.    Marland Kitchen LORazepam (ATIVAN) 0.5 MG tablet Take 1 tablet (0.5 mg total) by mouth every 8 (eight) hours as needed for anxiety. 30 tablet 0  . omeprazole (PRILOSEC) 40 MG capsule Take 40 mg by mouth daily.  4  . promethazine (PHENERGAN) 25 MG tablet Take 25 mg by mouth every 6 (six) hours as needed. for nausea  0  . rosuvastatin (CRESTOR) 10 MG tablet Take 1 tablet (10 mg total) by mouth daily. 30 tablet 11  . rosuvastatin (CRESTOR) 10 MG tablet Take 10 mg by mouth daily.    . tamoxifen (NOLVADEX) 20 MG tablet Take 1 tablet (20 mg total) by mouth daily. 90 tablet 3  . temazepam (RESTORIL) 30 MG capsule Take 30 mg by mouth at bedtime.     . traMADol (ULTRAM) 50 MG tablet Take 1-2 tablets (50-100 mg total) by mouth every 6 (six) hours as needed. (Patient not taking: Reported on 63/20/2020) 20 tablet 1  . Turmeric Curcumin 500 MG CAPS Take by mouth.     No current facility-administered medications for this visit.     PHYSICAL EXAMINATION: ECOG PERFORMANCE STATUS: 1 - Symptomatic but completely ambulatory  Vitals:   11/07/18 0926  BP: (!) 125/95  Pulse: 74  Resp: 17    Temp: 98 F (36.7 C)  SpO2: 100%   Filed Weights   11/07/18 0926  Weight: 155 lb 4.8 oz (70.4 kg)    GENERAL: alert, no distress and comfortable SKIN: skin color, texture, turgor are normal, no rashes or significant lesions EYES: normal, Conjunctiva are pink and non-injected, sclera clear OROPHARYNX: no exudate, no erythema and lips, buccal mucosa, and tongue normal  NECK: supple, thyroid normal size, non-tender, without nodularity LYMPH: no palpable lymphadenopathy in the cervical, axillary or inguinal LUNGS: clear to auscultation and percussion with normal breathing effort HEART: regular rate & rhythm and no murmurs and no lower extremity edema ABDOMEN: abdomen soft, non-tender and normal bowel sounds MUSCULOSKELETAL: no cyanosis of digits and no clubbing  NEURO: alert & oriented x 3 with fluent speech, no focal motor/sensory deficits EXTREMITIES: No lower extremity edema  LABORATORY DATA:  I have reviewed the data as listed CMP Latest Ref Rng & Units 07/04/2018 06/06/2018 05/21/2018  Glucose 70 - 99 mg/dL 92 79 87  BUN 8 - 23 mg/dL  _0 Creatinine 0.44 - 1.00 mg/dL 0.76 0.76 0.94  Sodium 135 - 145 mmol/L 140 139 142  Potassium 3.5 - 5.1 mmol/L 3.5 3.6 3.8  Chloride 98 - 111 mmol/L 109 107 105  CO2 22 - 32 mmol/L _1 Calcium 8.9 - 10.3 mg/dL 8.8(L) 9.2 9.5  Total Protein 6.5 - 8.1 g/dL - - 7.4  Total Bilirubin 0.3 - 1.2 mg/dL - - 0.5  Alkaline Phos 38 - 126 U/L - - 73  AST 15 - 41 U/L - - 16  ALT 0 - 44 U/L - - 17    Lab Results  Component Value Date   WBC 6.4 07/04/2018   HGB 12.6 07/04/2018   HCT 39.9 07/04/2018   MCV 98.5 07/04/2018   PLT 202 07/04/2018   NEUTROABS 4.1 05/21/2018    ASSESSMENT & PLAN:  Malignant neoplasm of overlapping sites of left breast in female, estrogen receptor positive ([place]) 06/13/2018:Bilateral mastectomies: Left: ILC, grade 2, 5.4 cm, LCIS, margins negative, lymphovascular invasion present, 2/7 lymph nodes positive with  extranodal extension (1 additional lymph node had isolated tumor cells), right mastectomy benign; T3N1a ER 100%, PR 10%, HER-2 -1+, Ki-67 10 to 15%, stage Ib MammaPrint: Low risk, luminal type a Adjuvant radiation therapy: 08/14/2018- MRI back: L4 and L5 small lesions could not rule out metastatic disease.  PET CT negative for bone metastases.  Treatment: Tamoxifen 20 mg daily (patient did not want to take anastrozole because of osteoporosis.)  Started 11/07/2018 Patient had multiple questions about tamoxifen and its toxicities including the interaction with BuSpar.  I discussed with her that since BuSpar cannot be stopped, we will proceed with treatment with tamoxifen.  The interaction between [place] and tamoxifen is where BuSpar makes tamoxifen slightly less effective.  She will start with tamoxifen 10 mg daily and then increase it to 20 mg next week.  I discussed with the patient that the only way to evaluate L4-L5 would be to biopsy them.  However if they are not visible on the CT then they cannot be biopsied easily.   Osteoporosis: Patient to discuss with her PCP about the best treatment option.  She read the side effects of the bisphosphonate therapies and does not want to take any of them.  Return to clinic in 3 months for survivorship care plan visit   No orders of the defined types were placed in this encounter.  The patient has a good understanding of the overall plan. she agrees with it. she will call with any problems that may develop before the next visit here.  Nicholas Lose, MD 11/07/2018  Julious Oka Dorshimer am acting as scribe for Dr. Nicholas Lose.  I have reviewed the above documentation for accuracy and completeness, and I agree with the above.

## 2018-11-07 ENCOUNTER — Other Ambulatory Visit: Payer: Self-pay

## 2018-11-07 ENCOUNTER — Encounter: Payer: Self-pay | Admitting: Physical Therapy

## 2018-11-07 ENCOUNTER — Telehealth: Payer: Self-pay | Admitting: Hematology and Oncology

## 2018-11-07 ENCOUNTER — Ambulatory Visit: Payer: 59 | Admitting: Physical Therapy

## 2018-11-07 ENCOUNTER — Inpatient Hospital Stay: Payer: 59 | Attending: Hematology and Oncology | Admitting: Hematology and Oncology

## 2018-11-07 DIAGNOSIS — M6281 Muscle weakness (generalized): Secondary | ICD-10-CM

## 2018-11-07 DIAGNOSIS — Z79899 Other long term (current) drug therapy: Secondary | ICD-10-CM | POA: Insufficient documentation

## 2018-11-07 DIAGNOSIS — R293 Abnormal posture: Secondary | ICD-10-CM

## 2018-11-07 DIAGNOSIS — Z79811 Long term (current) use of aromatase inhibitors: Secondary | ICD-10-CM | POA: Insufficient documentation

## 2018-11-07 DIAGNOSIS — Z923 Personal history of irradiation: Secondary | ICD-10-CM | POA: Diagnosis not present

## 2018-11-07 DIAGNOSIS — Z170 Estrogen receptor positive status [ER+]: Secondary | ICD-10-CM | POA: Insufficient documentation

## 2018-11-07 DIAGNOSIS — M25612 Stiffness of left shoulder, not elsewhere classified: Secondary | ICD-10-CM

## 2018-11-07 DIAGNOSIS — C50812 Malignant neoplasm of overlapping sites of left female breast: Secondary | ICD-10-CM | POA: Diagnosis not present

## 2018-11-07 DIAGNOSIS — Z9013 Acquired absence of bilateral breasts and nipples: Secondary | ICD-10-CM | POA: Insufficient documentation

## 2018-11-07 MED ORDER — TAMOXIFEN CITRATE 20 MG PO TABS: 20.0000 mg | ORAL_TABLET | Freq: Every day | ORAL | 3 refills | Status: DC

## 2018-11-07 MED FILL — TAMOXIFEN CITRATE 20 MG TAB: 20 | 90 days supply | Qty: 90 | Fill #0

## 2018-11-07 NOTE — Therapy (Signed)
[place], [place], [address] Phone: [phone]   Fax:  [phone]  Physical Therapy Treatment  Patient Details  Name: Dawn Booker MRN: [id number] Date of Birth: 1955-11-15 Referring Provider (PT): Dr.Gudena    Encounter Date: 11/07/2018  PT End of Session - 11/07/18 1219    Visit Number  5    Number of Visits  9    Date for PT Re-Evaluation  11/21/18    PT Start Time  0805    PT Stop Time  5701    PT Time Calculation (min)  42 min    Activity Tolerance  Patient tolerated treatment well    Behavior During Therapy  [facility] for tasks assessed/performed       Past Medical History:  Diagnosis Date  . ADD (attention deficit disorder)   . Anxiety   . Cancer [facility])    breast cancer  . Depression   . GERD (gastroesophageal reflux disease)   . Hiatal hernia   . History of radiation therapy 08/13/2018- 09/25/2018   Left Chest wall and IM nodes/ 50 Gy in 25 fractions, Left supraclaicular fossa and PAB/ 50 Gy in 25 fractions, Left chest wall scar boost/ 10 Gy in 5 fractions.   . Hyperlipemia, mixed   . Intermittent palpitations   . Malignant neoplasm of overlapping sites of left breast in female, estrogen receptor positive ([place]) 05/06/2018   oncologist-  dr Lindi Adie--  Invasive Lobular Cancer, CIS, Stage IB, Grade 2 (pT3,pN1a,cM0), ER+---  s/p  left mastectomy with sln dissections and right mastectomy (benign)  . PONV (postoperative nausea and vomiting)   . Right thyroid nodule   . Shingles 12/2017    Past Surgical History:  Procedure Laterality Date  . AXILLARY LYMPH NODE DISSECTION Left 07/09/2018   Procedure: COMPLETION OF LEFT AXILLARY LYMPH NODE DISSECTION;  Surgeon: Jovita Kussmaul, MD;  Location: [facility];  Service: General;  Laterality: Left;  . BREAST EXCISIONAL BIOPSY Left 1995   benign  . CARDIOVASCULAR STRESS TEST  06/21/2017   normal nuclear stress study w/ no ischemia/  normal LV function  and wall function, nuclear stress ef 70%  . ELBOW SURGERY Left child  . MASTECTOMY W/ SENTINEL NODE BIOPSY Left 06/13/2018  . MASTECTOMY WITH RADIOACTIVE SEED GUIDED EXCISION AND AXILLARY SENTINEL LYMPH NODE BIOPSY Bilateral 06/13/2018   Procedure: LEFT MASTECTOMY WITH SENTINEL LYMPH NODE MAPPING AND TARGETED NODE DISSECTION AND RIGHT PROPHYLATIC MASTECTOMY;  Surgeon: Jovita Kussmaul, MD;  Location: [facility];  Service: General;  Laterality: Bilateral;  . THYROIDECTOMY Left 10-31-2001   dr gerkin '@[facility]'   . TRANSTHORACIC ECHOCARDIOGRAM  06/21/2017   ef 60-65%/  trivial MR (no evidence mvp)  . TUBAL LIGATION  yrs ago  . VAGINAL HYSTERECTOMY  1990s    There were no vitals filed for this visit.  Subjective Assessment - 11/07/18 0807    Subjective  The therapy helped but I was sore after last time.     Pertinent History  Patient was diagnosed on 04/25/18 with left grade II invasive lobular carci1noma breast cancer. There are 2 areas: 2.6 cm in the upper outer quadrant and 6 mm in the upper inner quadrant. Both are ER/PR positive and HER2 negative with a Ki67 of 10% and she has a positive axillary node. Bilateral mastectomy on 06/13/2018 wiht 7 nodes removed and the 10 in second surgery on 07/09/2018  She had radiation and did well. She has had some recent problem with left hip  starting in [place].  She has possible labral tear ?? she will received a steroid shot tomorrow     Patient Stated Goals  to be able to get range of motion back in her arm and see what she can do to help her lymphatics     Currently in Pain?  Yes    Pain Score  2     Pain Location  Axilla    Pain Orientation  Anterior;Left                       [facility] Adult PT Treatment/Exercise - 11/07/18 0001      Exercises   Exercises  Shoulder      Shoulder Exercises: Supine   Horizontal ABduction  Strengthening;Both;10 reps   pt returned therapist demo   Theraband Level (Shoulder Horizontal ABduction)  Level 1 (Yellow)     External Rotation  Strengthening;Both;10 reps   pt returned therapist demo   Theraband Level (Shoulder External Rotation)  Level 1 (Yellow)    Flexion  Strengthening;Both;10 reps   narrow and wide grip, pt returned therapist demo   Theraband Level (Shoulder Flexion)  Level 1 (Yellow)    Diagonals  Strengthening;Both;10 reps   pt returned therapist demo   Theraband Level (Shoulder Diagonals)  Level 1 (Yellow)      Manual Therapy   Soft tissue mobilization  to left pec while moving LUE into abduction to help decrease tightness, using biotone in right sidelying to left lateral trunk and shoulder external rotators in area of tightness and discomfort, pt also has trigger point just inferior to her mastectomy scar- educated pt to massage this at home    Myofascial Release  across left pec    Passive ROM  In Supine to Lt shoulder into flexion, abduction and D2 to tolerance   v/c to keep shoulders retracted and depressed to avoid p!              PT Short Term Goals - 07/22/18 1750      PT SHORT TERM GOAL #1   Title  Pt will verbalize lymphedema risk reduction practices     Time  4    Period  Weeks    Status  New      PT SHORT TERM GOAL #2   Title  Pt will have 125 degrees if painless left shoulder abduction so that she can receive radiation     Time  4    Period  Weeks    Status  New      PT SHORT TERM GOAL #3   Title  Pt will be independent in a home exercise program for shoulder range of motion     Time  4    Period  Weeks        PT Long Term Goals - 10/31/18 4356      PT LONG TERM GOAL #1   Title  Pt will report the pain and discomfort in her left upper quadrant is decreased by 50%     Baseline  10/31/18- 95%    Time  4    Period  Weeks    Status  Achieved      PT LONG TERM GOAL #2   Title  Pt will be independent in a home exercise program for strengthening     Time  4    Period  Weeks    Status  On-going      PT LONG TERM GOAL #  3   Title  Pt will report  she is able to manage her symptoms of lymphedema on her own at home    Baseline  10/31/18- pt does not feel independent with this     Time  4    Period  Weeks      PT LONG TERM GOAL #4   Title  Pt will increase left shoulder painless abduction to 165 degress so she can have easier functional mobility     Baseline  150, 10/31/18 - 155    Time  4    Period  Weeks    Status  On-going            Plan - 11/07/18 1219    Clinical Impression Statement  Continued with stretching to left pec today using myofacial release and PROM with UE pulling to decrease impingement. Instructed pt today in supine scap series since she is having impingement due to anterior shoulder. Issued this as part of an HEP.     Rehab Potential  Good    Clinical Impairments Affecting Rehab Potential  17 nodes removed with second surgery needed for lymph node dissection , radiation     PT Frequency  2x / week    PT Duration  6 weeks    PT Treatment/Interventions  ADLs/Self Care Home Management;DME Instruction;Therapeutic activities;Therapeutic exercise;Orthotic Fit/Training;Patient/family education;Manual techniques;Neuromuscular re-education;Manual lymph drainage;Compression bandaging;Scar mobilization;Taping;Passive range of motion    PT Next Visit Plan  assess indep with supine scap, give more pec stretches, continued manual therapy to left pec, Manual lymph drainage to left upper quadrant and gentle ROM of left shoulder.  , check fit of compression camisole     PT Home Exercise Plan  Post op shoulder ROM HEP, corner stretch, supine scap    Consulted and Agree with Plan of Care  Patient       Patient will benefit from skilled therapeutic intervention in order to improve the following deficits and impairments:  Decreased knowledge of precautions, Pain, Impaired UE functional use, Decreased range of motion, Postural dysfunction, Decreased skin integrity, Increased fascial restricitons, Decreased scar mobility, Impaired  perceived functional ability, Decreased strength, Decreased activity tolerance, Increased edema  Visit Diagnosis: Stiffness of left shoulder joint  Abnormal posture  Muscle weakness (generalized)     Problem List Patient Active Problem List   Diagnosis Date Noted  . Cancer of left female breast  ([place]) 06/13/2018  . Malignant neoplasm of overlapping sites of left breast in female, estrogen receptor positive ([place]) 05/09/2018  . Blurring of visual image of both eyes 12/06/2017  . Postherpetic neuralgia 12/06/2017  . BIPOLAR DISORDER UNSPECIFIED 10/10/2009  . ESOPHAGEAL REFLUX 10/10/2009    Allyson Sabal [facility] 11/07/2018, 12:21 PM  [place], [place], [address] Phone: [phone]   Fax:  [phone]  Name: RAELYNNE LUDWICK MRN: [id number] Date of Birth: 1955-09-12  Manus Gunning, PT 11/07/18 12:21 PM

## 2018-11-07 NOTE — Telephone Encounter (Signed)
No los °

## 2018-11-07 NOTE — Assessment & Plan Note (Addendum)
06/13/2018:Bilateral mastectomies: Left: ILC, grade 2, 5.4 cm, LCIS, margins negative, lymphovascular invasion present, 2/7 lymph nodes positive with extranodal extension (1 additional lymph node had isolated tumor cells), right mastectomy benign; T3N1a ER 100%, PR 10%, HER-2 -1+, Ki-67 10 to 15%, stage Ib MammaPrint: Low risk, luminal type a Adjuvant radiation therapy: 08/14/2018- MRI back: L4 and L5 small lesions could not rule out metastatic disease.  PET CT negative for bone metastases.  Treatment: Tamoxifen 20 mg daily (patient did not want to take anastrozole because of osteoporosis.)  Started 10/29/2018 Tamoxifen toxicities:  I discussed with the patient that the only way to evaluate L4-L5 would be to biopsy them.  However if they are not visible on the CT then they cannot be biopsied easily.  I will discuss with interventional radiology about this.  Osteoporosis: Patient to discuss with her PCP about the best treatment option.  Return to clinic in 3 months for survivorship care plan visit

## 2018-11-07 NOTE — Patient Instructions (Signed)

## 2018-11-11 ENCOUNTER — Ambulatory Visit: Payer: 59 | Admitting: Physical Therapy

## 2018-11-11 ENCOUNTER — Encounter: Payer: Self-pay | Admitting: Physical Therapy

## 2018-11-11 DIAGNOSIS — L599 Disorder of the skin and subcutaneous tissue related to radiation, unspecified: Secondary | ICD-10-CM

## 2018-11-11 DIAGNOSIS — Z170 Estrogen receptor positive status [ER+]: Secondary | ICD-10-CM | POA: Diagnosis not present

## 2018-11-11 DIAGNOSIS — Z79899 Other long term (current) drug therapy: Secondary | ICD-10-CM | POA: Diagnosis not present

## 2018-11-11 DIAGNOSIS — M6281 Muscle weakness (generalized): Secondary | ICD-10-CM

## 2018-11-11 DIAGNOSIS — Z923 Personal history of irradiation: Secondary | ICD-10-CM | POA: Diagnosis not present

## 2018-11-11 DIAGNOSIS — Z79811 Long term (current) use of aromatase inhibitors: Secondary | ICD-10-CM | POA: Diagnosis not present

## 2018-11-11 DIAGNOSIS — Z483 Aftercare following surgery for neoplasm: Secondary | ICD-10-CM

## 2018-11-11 DIAGNOSIS — Z9013 Acquired absence of bilateral breasts and nipples: Secondary | ICD-10-CM | POA: Diagnosis not present

## 2018-11-11 DIAGNOSIS — I972 Postmastectomy lymphedema syndrome: Secondary | ICD-10-CM

## 2018-11-11 DIAGNOSIS — M25612 Stiffness of left shoulder, not elsewhere classified: Secondary | ICD-10-CM

## 2018-11-11 DIAGNOSIS — R293 Abnormal posture: Secondary | ICD-10-CM

## 2018-11-11 DIAGNOSIS — C50812 Malignant neoplasm of overlapping sites of left female breast: Secondary | ICD-10-CM | POA: Diagnosis not present

## 2018-11-11 NOTE — Therapy (Signed)
[place], [place], [address] Phone: [phone]   Fax:  [phone]  Physical Therapy Treatment  Patient Details  Name: Dawn Booker MRN: [id number] Date of Birth: [birth year] Referring Provider (PT): Dr.Gudena    Encounter Date: 11/11/2018  PT End of Session - 11/11/18 1709    Visit Number  6    Number of Visits  9    Date for PT Re-Evaluation  11/21/18    PT Start Time  1600    PT Stop Time  1645    PT Time Calculation (min)  45 min    Activity Tolerance  Patient tolerated treatment well    Behavior During Therapy  [facility] for tasks assessed/performed       Past Medical History:  Diagnosis Date  . ADD (attention deficit disorder)   . Anxiety   . Cancer [facility])    breast cancer  . Depression   . GERD (gastroesophageal reflux disease)   . Hiatal hernia   . History of radiation therapy 08/13/2018- 09/25/2018   Left Chest wall and IM nodes/ 50 Gy in 25 fractions, Left supraclaicular fossa and PAB/ 50 Gy in 25 fractions, Left chest wall scar boost/ 10 Gy in 5 fractions.   . Hyperlipemia, mixed   . Intermittent palpitations   . Malignant neoplasm of overlapping sites of left breast in female, estrogen receptor positive ([place]) 05/06/2018   oncologist-  dr Lindi Adie--  Invasive Lobular Cancer, CIS, Stage IB, Grade 2 (pT3,pN1a,cM0), ER+---  s/p  left mastectomy with sln dissections and right mastectomy (benign)  . PONV (postoperative nausea and vomiting)   . Right thyroid nodule   . Shingles 12/2017    Past Surgical History:  Procedure Laterality Date  . AXILLARY LYMPH NODE DISSECTION Left 07/09/2018   Procedure: COMPLETION OF LEFT AXILLARY LYMPH NODE DISSECTION;  Surgeon: Jovita Kussmaul, MD;  Location: [facility];  Service: General;  Laterality: Left;  . BREAST EXCISIONAL BIOPSY Left 1995   benign  . CARDIOVASCULAR STRESS TEST  06/21/2017   normal nuclear stress study w/ no ischemia/  normal LV function  and wall function, nuclear stress ef 70%  . ELBOW SURGERY Left child  . MASTECTOMY W/ SENTINEL NODE BIOPSY Left 06/13/2018  . MASTECTOMY WITH RADIOACTIVE SEED GUIDED EXCISION AND AXILLARY SENTINEL LYMPH NODE BIOPSY Bilateral 06/13/2018   Procedure: LEFT MASTECTOMY WITH SENTINEL LYMPH NODE MAPPING AND TARGETED NODE DISSECTION AND RIGHT PROPHYLATIC MASTECTOMY;  Surgeon: Jovita Kussmaul, MD;  Location: [place];  Service: General;  Laterality: Bilateral;  . THYROIDECTOMY Left 10-31-2001   dr gerkin _0   . TRANSTHORACIC ECHOCARDIOGRAM  06/21/2017   ef 60-65%/  trivial MR (no evidence mvp)  . TUBAL LIGATION  yrs ago  . VAGINAL HYSTERECTOMY  1990s    There were no vitals filed for this visit.  Subjective Assessment - 11/11/18 1611    Subjective  Pt states she started the tomaxifen last week.  She had to reschedule her [place] til this week. She does not feels well today and has had some pain in her axilla with fullness down her side     Pertinent History  Patient was diagnosed on 04/25/18 with left grade II invasive lobular carci1noma breast cancer. There are 2 areas: 2.6 cm in the upper outer quadrant and 6 mm in the upper inner quadrant. Both are ER/PR positive and HER2 negative with a Ki67 of 10% and she has a positive axillary node. Bilateral mastectomy on 06/13/2018  wiht 7 nodes removed and the 10 in second surgery on 07/09/2018  She had radiation and did well. She has had some recent problem with left hip starting in [place].  She has possible labral tear ?? she will received a steroid shot tomorrow     Patient Stated Goals  to be able to get range of motion back in her arm and see what she can do to help her lymphatics     Currently in Pain?  Yes    Pain Score  --   did not rate                      [facility] Adult PT Treatment/Exercise - 11/11/18 0001      Self-Care   Other Self-Care Comments   sent note to [facility] asking for prescription for mastectomy cami/bra       Manual Therapy   Manual therapy comments  extra time spent on palpable fullness in axilla     Manual Lymphatic Drainage (MLD)  In Supine: Short neck, 5 diaphragmatic breaths then Lt axillo-inguinal anastomosis focusing on Lt hcest wall, and Lt upper arm all redirecting towards anastomosis. then to sidelying form work on trunk and lateral chest     Passive ROM  In Supine to Lt shoulder into flexion, abduction and D2 to tolerance   pt with pain and tightness with stretch               PT Short Term Goals - 07/22/18 1750      PT SHORT TERM GOAL #1   Title  Pt will verbalize lymphedema risk reduction practices     Time  4    Period  Weeks    Status  New      PT SHORT TERM GOAL #2   Title  Pt will have 125 degrees if painless left shoulder abduction so that she can receive radiation     Time  4    Period  Weeks    Status  New      PT SHORT TERM GOAL #3   Title  Pt will be independent in a home exercise program for shoulder range of motion     Time  4    Period  Weeks        PT Long Term Goals - 10/31/18 5093      PT LONG TERM GOAL #1   Title  Pt will report the pain and discomfort in her left upper quadrant is decreased by 50%     Baseline  10/31/18- 95%    Time  4    Period  Weeks    Status  Achieved      PT LONG TERM GOAL #2   Title  Pt will be independent in a home exercise program for strengthening     Time  4    Period  Weeks    Status  On-going      PT LONG TERM GOAL #3   Title  Pt will report she is able to manage her symptoms of lymphedema on her own at home    Baseline  10/31/18- pt does not feel independent with this     Time  4    Period  Weeks      PT LONG TERM GOAL #4   Title  Pt will increase left shoulder painless abduction to 165 degress so she can have easier functional mobility     Baseline  150, 10/31/18 - 155    Time  4    Period  Weeks    Status  On-going            Plan - 11/11/18 1710    Clinical Impression Statement  Pt not  feeling well today and has visible and palpable congestion in chest and lateral trunk with pain with shoulder stretches. She states she is doing fine with supine scapular series. She wore compression sleeve this am, but it was too uncomfortable.  Sript request for mastectomy cam[facility]     Clinical Impairments Affecting Rehab Potential  17 nodes removed with second surgery needed for lymph node dissection , radiation     PT Frequency  2x / week    PT Duration  6 weeks    PT Treatment/Interventions  ADLs/Self Care Home Management;DME Instruction;Therapeutic activities;Therapeutic exercise;Orthotic Fit/Training;Patient/family education;Manual techniques;Neuromuscular re-education;Manual lymph drainage;Compression bandaging;Scar mobilization;Taping;Passive range of motion    PT Next Visit Plan  give more pec and shooulder stretches, continued manual therapy to left pec, Manual lymph drainage to left upper quadrant and gentle ROM of left shoulder.  , check fit of compression camisole        Patient will benefit from skilled therapeutic intervention in order to improve the following deficits and impairments:  Decreased knowledge of precautions, Pain, Impaired UE functional use, Decreased range of motion, Postural dysfunction, Decreased skin integrity, Increased fascial restricitons, Decreased scar mobility, Impaired perceived functional ability, Decreased strength, Decreased activity tolerance, Increased edema  Visit Diagnosis: Stiffness of left shoulder joint  Abnormal posture  Muscle weakness (generalized)  Disorder of the skin and subcutaneous tissue related to radiation, unspecified  Postmastectomy lymphedema  Aftercare following surgery for neoplasm     Problem List Patient Active Problem List   Diagnosis Date Noted  . Cancer of left female breast  ([place]) 06/13/2018  . Malignant neoplasm of overlapping sites of left breast in female, estrogen receptor positive ([place])  05/09/2018  . Blurring of visual image of both eyes 12/06/2017  . Postherpetic neuralgia 12/06/2017  . BIPOLAR DISORDER UNSPECIFIED 10/10/2009  . ESOPHAGEAL REFLUX 10/10/2009   Donato Heinz. Owens Shark PT  Norwood Levo 11/11/2018, 5:14 PM  [place], [place], [address] Phone: [phone]   Fax:  [phone]  Name: Dawn Booker MRN: [id number] Date of Birth: October 07, 1955

## 2018-11-14 ENCOUNTER — Ambulatory Visit: Payer: 59 | Admitting: Physical Therapy

## 2018-11-14 ENCOUNTER — Other Ambulatory Visit: Payer: Self-pay

## 2018-11-14 ENCOUNTER — Encounter: Payer: Self-pay | Admitting: Physical Therapy

## 2018-11-14 DIAGNOSIS — Z923 Personal history of irradiation: Secondary | ICD-10-CM | POA: Diagnosis not present

## 2018-11-14 DIAGNOSIS — Z170 Estrogen receptor positive status [ER+]: Secondary | ICD-10-CM | POA: Diagnosis not present

## 2018-11-14 DIAGNOSIS — C50812 Malignant neoplasm of overlapping sites of left female breast: Secondary | ICD-10-CM | POA: Diagnosis not present

## 2018-11-14 DIAGNOSIS — R293 Abnormal posture: Secondary | ICD-10-CM

## 2018-11-14 DIAGNOSIS — M25612 Stiffness of left shoulder, not elsewhere classified: Secondary | ICD-10-CM

## 2018-11-14 DIAGNOSIS — L599 Disorder of the skin and subcutaneous tissue related to radiation, unspecified: Secondary | ICD-10-CM

## 2018-11-14 DIAGNOSIS — Z79899 Other long term (current) drug therapy: Secondary | ICD-10-CM | POA: Diagnosis not present

## 2018-11-14 DIAGNOSIS — M6281 Muscle weakness (generalized): Secondary | ICD-10-CM

## 2018-11-14 DIAGNOSIS — Z9013 Acquired absence of bilateral breasts and nipples: Secondary | ICD-10-CM | POA: Diagnosis not present

## 2018-11-14 DIAGNOSIS — I972 Postmastectomy lymphedema syndrome: Secondary | ICD-10-CM

## 2018-11-14 DIAGNOSIS — Z79811 Long term (current) use of aromatase inhibitors: Secondary | ICD-10-CM | POA: Diagnosis not present

## 2018-11-14 NOTE — Patient Instructions (Signed)
Flexion (Isometric)      Cancer Rehab [phone]    Press right fist against wall. Hold __5__ seconds. Repeat _5-10___ times. Do __1-2__ sessions per day.     External Rotation (Isometric)    Place back of left fist against door frame, with elbow bent. Press fist against door frame. Hold __5__ seconds. Repeat _5-10___ times. Do _1-2___ sessions per day.  Extension (Isometric)    Place left bent elbow and back of arm against wall. Press elbow against wall. Hold __5__ seconds. Repeat _5-10___ times. Do _1-2___ sessions per day.

## 2018-11-14 NOTE — Therapy (Signed)
[place], [place], [address] Phone: [phone]   Fax:  [phone]  Physical Therapy Treatment  Patient Details  Name: Dawn Booker MRN: [id number] Date of Birth: 06/18/1956 Referring Provider (PT): Dr.Gudena    Encounter Date: 11/14/2018  PT End of Session - 11/14/18 1529    Visit Number  7    Number of Visits  9    Date for PT Re-Evaluation  11/21/18    PT Start Time  0800    PT Stop Time  0845    PT Time Calculation (min)  45 min    Activity Tolerance  Patient tolerated treatment well    Behavior During Therapy  [facility] for tasks assessed/performed       Past Medical History:  Diagnosis Date  . ADD (attention deficit disorder)   . Anxiety   . Cancer [facility])    breast cancer  . Depression   . GERD (gastroesophageal reflux disease)   . Hiatal hernia   . History of radiation therapy 08/13/2018- 09/25/2018   Left Chest wall and IM nodes/ 50 Gy in 25 fractions, Left supraclaicular fossa and PAB/ 50 Gy in 25 fractions, Left chest wall scar boost/ 10 Gy in 5 fractions.   . Hyperlipemia, mixed   . Intermittent palpitations   . Malignant neoplasm of overlapping sites of left breast in female, estrogen receptor positive ([place]) 05/06/2018   oncologist-  dr Lindi Adie--  Invasive Lobular Cancer, CIS, Stage IB, Grade 2 (pT3,pN1a,cM0), ER+---  s/p  left mastectomy with sln dissections and right mastectomy (benign)  . PONV (postoperative nausea and vomiting)   . Right thyroid nodule   . Shingles 12/2017    Past Surgical History:  Procedure Laterality Date  . AXILLARY LYMPH NODE DISSECTION Left 07/09/2018   Procedure: COMPLETION OF LEFT AXILLARY LYMPH NODE DISSECTION;  Surgeon: Jovita Kussmaul, MD;  [facility];  Service: General;  Laterality: Left;  . BREAST EXCISIONAL BIOPSY Left 1995   benign  . CARDIOVASCULAR STRESS TEST  06/21/2017   normal nuclear stress study w/ no ischemia/  normal LV function  and wall function, nuclear stress ef 70%  . ELBOW SURGERY Left child  . MASTECTOMY W/ SENTINEL NODE BIOPSY Left 06/13/2018  . MASTECTOMY WITH RADIOACTIVE SEED GUIDED EXCISION AND AXILLARY SENTINEL LYMPH NODE BIOPSY Bilateral 06/13/2018   Procedure: LEFT MASTECTOMY WITH SENTINEL LYMPH NODE MAPPING AND TARGETED NODE DISSECTION AND RIGHT PROPHYLATIC MASTECTOMY;  Surgeon: Jovita Kussmaul, MD;  Location: [place];  Service: General;  Laterality: Bilateral;  . THYROIDECTOMY Left 10-31-2001   dr gerkin '@[facility]'   . TRANSTHORACIC ECHOCARDIOGRAM  06/21/2017   ef 60-65%/  trivial MR (no evidence mvp)  . TUBAL LIGATION  yrs ago  . VAGINAL HYSTERECTOMY  1990s    There were no vitals filed for this visit.  Subjective Assessment - 11/14/18 0811    Subjective  Prescrption received for compression cami/bra  and given to patinet  She is getting her flexi demonstration today     Pertinent History  Patient was diagnosed on 04/25/18 with left grade II invasive lobular carci1noma breast cancer. There are 2 areas: 2.6 cm in the upper outer quadrant and 6 mm in the upper inner quadrant. Both are ER/PR positive and HER2 negative with a Ki67 of 10% and she has a positive axillary node. Bilateral mastectomy on 06/13/2018 wiht 7 nodes removed and the 10 in second surgery on 07/09/2018  She had radiation and did well. She  has had some recent problem with left hip starting in [place].  She has possible labral tear ?? she will received a steroid shot tomorrow     Patient Stated Goals  to be able to get range of motion back in her arm and see what she can do to help her lymphatics     Currently in Pain?  No/denies            LYMPHEDEMA/ONCOLOGY QUESTIONNAIRE - 11/14/18 1034      Left Upper Extremity Lymphedema   At [facility] of 2nd Digit  5.7 cm                [facility] Adult PT Treatment/Exercise - 11/14/18 0001      Exercises   Exercises  Shoulder;Lumbar      Lumbar Exercises: Standing   Other Standing Lumbar  Exercises  instructed in proper alignment for hip hinge with dowel rod at back for tactile cues      Shoulder Exercises: Pulleys   Flexion  2 minutes    ABduction  2 minutes    ABduction Limitations  cues to go slowly and take a breath       Shoulder Exercises: ROM/Strengthening   Over Head Lace  5 reps in each direction    Ball on Wall  both hands on ball and roll it up with deep stretch at the top x 10 reps       Shoulder Exercises: Isometric Strengthening   Flexion  5X5"    Extension  3X5"    ABduction  5X5"    Other Isometric Exercises  pt needed prolonged time to learn proper technique with hand over hand on her deltoid to feel the muscle firing.  pt with significantly less activation of left deltoid than right.  Pt able to acknowledge understanding of what she was trying to do.       Shoulder Exercises: Stretch   Other Shoulder Stretches  arm out to 90 degrees of abduction on wall for chest stretch     Other Shoulder Stretches  modified downward dog stretch       Manual Therapy   Manual Therapy  Manual Lymphatic Drainage (MLD)    Manual therapy comments  pt with palpable fullness in left abdomen, lateral chest and axilla.  She was asked to pay attention to this area after Flexi demonstration today to see if it is helpful to her.     Manual Lymphatic Drainage (MLD)  in supine, short neck, diaphragmatic breathing. left abdomen and to sidelying for laterala chest and back.                PT Short Term Goals - 07/22/18 1750      PT SHORT TERM GOAL #1   Title  Pt will verbalize lymphedema risk reduction practices     Time  4    Period  Weeks    Status  New      PT SHORT TERM GOAL #2   Title  Pt will have 125 degrees if painless left shoulder abduction so that she can receive radiation     Time  4    Period  Weeks    Status  New      PT SHORT TERM GOAL #3   Title  Pt will be independent in a home exercise program for shoulder range of motion     Time  4    Period   Weeks  PT Long Term Goals - 10/31/18 0853      PT LONG TERM GOAL #1   Title  Pt will report the pain and discomfort in her left upper quadrant is decreased by 50%     Baseline  10/31/18- 95%    Time  4    Period  Weeks    Status  Achieved      PT LONG TERM GOAL #2   Title  Pt will be independent in a home exercise program for strengthening     Time  4    Period  Weeks    Status  On-going      PT LONG TERM GOAL #3   Title  Pt will report she is able to manage her symptoms of lymphedema on her own at home    Baseline  10/31/18- pt does not feel independent with this     Time  4    Period  Weeks      PT LONG TERM GOAL #4   Title  Pt will increase left shoulder painless abduction to 165 degress so she can have easier functional mobility     Baseline  150, 10/31/18 - 155    Time  4    Period  Weeks    Status  On-going            Plan - 11/14/18 1529    Clinical Impression Statement  Upgraded to more strengtheing and streching today especally to trunk as this is the area pt seems to be having lymphatic congestion.  She will be getting compression camisole/bra and have Flexi demonstration today to see if she can affect change in this area.     Clinical Impairments Affecting Rehab Potential  17 nodes removed with second surgery needed for lymph node dissection , radiation     PT Treatment/Interventions  ADLs/Self Care Home Management;DME Instruction;Therapeutic activities;Therapeutic exercise;Orthotic Fit/Training;Patient/family education;Manual techniques;Neuromuscular re-education;Manual lymph drainage;Compression bandaging;Scar mobilization;Taping;Passive range of motion    PT Next Visit Plan  assess exercise and results of flexi demonstration continue with strength ABC program give more pec and shooulder stretches, continued manual therapy to left pec, Manual lymph drainage to left upper quadrant and gentle ROM of left shoulder.  , check fit of compression camisole         Patient will benefit from skilled therapeutic intervention in order to improve the following deficits and impairments:  Decreased knowledge of precautions, Pain, Impaired UE functional use, Decreased range of motion, Postural dysfunction, Decreased skin integrity, Increased fascial restricitons, Decreased scar mobility, Impaired perceived functional ability, Decreased strength, Decreased activity tolerance, Increased edema  Visit Diagnosis: No diagnosis found.     Problem List Patient Active Problem List   Diagnosis Date Noted  . Cancer of left female breast  ([place]) 06/13/2018  . Malignant neoplasm of overlapping sites of left breast in female, estrogen receptor positive ([place]) 05/09/2018  . Blurring of visual image of both eyes 12/06/2017  . Postherpetic neuralgia 12/06/2017  . BIPOLAR DISORDER UNSPECIFIED 10/10/2009  . ESOPHAGEAL REFLUX 10/10/2009   Donato Heinz. Owens Shark PT  Norwood Levo 11/14/2018, 3:33 PM  [place], [place], [address] Phone: [phone]   Fax:  [phone]  Name: CADY HAFEN MRN: [id number] Date of Birth: Jan 06, 1956

## 2018-11-18 ENCOUNTER — Other Ambulatory Visit: Payer: Self-pay

## 2018-11-18 ENCOUNTER — Ambulatory Visit: Payer: 59 | Admitting: Physical Therapy

## 2018-11-18 DIAGNOSIS — C50812 Malignant neoplasm of overlapping sites of left female breast: Secondary | ICD-10-CM | POA: Diagnosis not present

## 2018-11-18 DIAGNOSIS — Z170 Estrogen receptor positive status [ER+]: Secondary | ICD-10-CM | POA: Diagnosis not present

## 2018-11-18 DIAGNOSIS — Z923 Personal history of irradiation: Secondary | ICD-10-CM | POA: Diagnosis not present

## 2018-11-18 DIAGNOSIS — M6281 Muscle weakness (generalized): Secondary | ICD-10-CM

## 2018-11-18 DIAGNOSIS — R293 Abnormal posture: Secondary | ICD-10-CM

## 2018-11-18 DIAGNOSIS — L599 Disorder of the skin and subcutaneous tissue related to radiation, unspecified: Secondary | ICD-10-CM

## 2018-11-18 DIAGNOSIS — M25612 Stiffness of left shoulder, not elsewhere classified: Secondary | ICD-10-CM

## 2018-11-18 DIAGNOSIS — I972 Postmastectomy lymphedema syndrome: Secondary | ICD-10-CM

## 2018-11-18 DIAGNOSIS — Z79811 Long term (current) use of aromatase inhibitors: Secondary | ICD-10-CM | POA: Diagnosis not present

## 2018-11-18 DIAGNOSIS — Z9013 Acquired absence of bilateral breasts and nipples: Secondary | ICD-10-CM | POA: Diagnosis not present

## 2018-11-18 DIAGNOSIS — Z79899 Other long term (current) drug therapy: Secondary | ICD-10-CM | POA: Diagnosis not present

## 2018-11-18 NOTE — Therapy (Signed)
[place], [place], [address] Phone: [phone]   Fax:  [phone]  Physical Therapy Treatment  Patient Details  Name: Dawn Booker MRN: [id number] Date of Birth: 06-17-1956 Referring Provider (PT): Dr.Gudena    Encounter Date: 11/18/2018  PT End of Session - 11/18/18 1810    Visit Number  8    Number of Visits  9    Date for PT Re-Evaluation  11/21/18    PT Start Time  2060    PT Stop Time  1430    PT Time Calculation (min)  45 min    Activity Tolerance  Patient tolerated treatment well    Behavior During Therapy  [facility] for tasks assessed/performed       Past Medical History:  Diagnosis Date  . ADD (attention deficit disorder)   . Anxiety   . Cancer [facility])    breast cancer  . Depression   . GERD (gastroesophageal reflux disease)   . Hiatal hernia   . History of radiation therapy 08/13/2018- 09/25/2018   Left Chest wall and IM nodes/ 50 Gy in 25 fractions, Left supraclaicular fossa and PAB/ 50 Gy in 25 fractions, Left chest wall scar boost/ 10 Gy in 5 fractions.   . Hyperlipemia, mixed   . Intermittent palpitations   . Malignant neoplasm of overlapping sites of left breast in female, estrogen receptor positive ([place]) 05/06/2018   oncologist-  dr Lindi Adie--  Invasive Lobular Cancer, CIS, Stage IB, Grade 2 (pT3,pN1a,cM0), ER+---  s/p  left mastectomy with sln dissections and right mastectomy (benign)  . PONV (postoperative nausea and vomiting)   . Right thyroid nodule   . Shingles 12/2017    Past Surgical History:  Procedure Laterality Date  . AXILLARY LYMPH NODE DISSECTION Left 07/09/2018   Procedure: COMPLETION OF LEFT AXILLARY LYMPH NODE DISSECTION;  Surgeon: Jovita Kussmaul, MD;  Location: [facility];  Service: General;  Laterality: Left;  . BREAST EXCISIONAL BIOPSY Left 1995   benign  . CARDIOVASCULAR STRESS TEST  06/21/2017   normal nuclear stress study w/ no ischemia/  normal LV function  and wall function, nuclear stress ef 70%  . ELBOW SURGERY Left child  . MASTECTOMY W/ SENTINEL NODE BIOPSY Left 06/13/2018  . MASTECTOMY WITH RADIOACTIVE SEED GUIDED EXCISION AND AXILLARY SENTINEL LYMPH NODE BIOPSY Bilateral 06/13/2018   Procedure: LEFT MASTECTOMY WITH SENTINEL LYMPH NODE MAPPING AND TARGETED NODE DISSECTION AND RIGHT PROPHYLATIC MASTECTOMY;  Surgeon: Jovita Kussmaul, MD;  Location: [facility];  Service: General;  Laterality: Bilateral;  . THYROIDECTOMY Left 10-31-2001   dr gerkin '@[facility]'   . TRANSTHORACIC ECHOCARDIOGRAM  06/21/2017   ef 60-65%/  trivial MR (no evidence mvp)  . TUBAL LIGATION  yrs ago  . VAGINAL HYSTERECTOMY  1990s    There were no vitals filed for this visit.  Subjective Assessment - 11/18/18 1352    Subjective  Pt did not get her Flexi demonstration, She talked to the sales rep and he is going to mail it to her. She did go to [employer] and got a mastectomy bra, but Sandi does not like it.  She did not get a compression bra and could not find a compression cami to fit her a the store.  She is wearing a "body shaper that is offering her some compression to her trunk that seems to be doing well.     Pertinent History  Patient was diagnosed on 04/25/18 with left grade II invasive lobular carci1noma breast  cancer. There are 2 areas: 2.6 cm in the upper outer quadrant and 6 mm in the upper inner quadrant. Both are ER/PR positive and HER2 negative with a Ki67 of 10% and she has a positive axillary node. Bilateral mastectomy on 06/13/2018 wiht 7 nodes removed and the 10 in second surgery on 07/09/2018  She had radiation and did well. She has had some recent problem with left hip starting in [place].  She has possible labral tear ?? she will received a steroid shot tomorrow     Patient Stated Goals  to be able to get range of motion back in her arm and see what she can do to help her lymphatics     Currently in Pain?  No/denies                       [facility] Adult PT  Treatment/Exercise - 11/18/18 0001      Exercises   Exercises  Other Exercises    Other Exercises   Instructed in Strength ABC program.  Pt was able to demonstate all exercises and stated that she understood progression              PT Education - 11/18/18 1810    Education Details  Strength ABC program     Person(s) Educated  Patient    Methods  Explanation;Demonstration;Handout    Comprehension  Verbalized understanding;Returned demonstration       PT Short Term Goals - 11/18/18 1813      PT SHORT TERM GOAL #1   Title  Pt will verbalize lymphedema risk reduction practices     Status  Achieved      PT SHORT TERM GOAL #2   Title  Pt will have 125 degrees if painless left shoulder abduction so that she can receive radiation     Status  Achieved      PT SHORT TERM GOAL #3   Title  Pt will be independent in a home exercise program for shoulder range of motion     Status  Achieved        PT Long Term Goals - 10/31/18 0853      PT LONG TERM GOAL #1   Title  Pt will report the pain and discomfort in her left upper quadrant is decreased by 50%     Baseline  10/31/18- 95%    Time  4    Period  Weeks    Status  Achieved      PT LONG TERM GOAL #2   Title  Pt will be independent in a home exercise program for strengthening     Time  4    Period  Weeks    Status  On-going      PT LONG TERM GOAL #3   Title  Pt will report she is able to manage her symptoms of lymphedema on her own at home    Baseline  10/31/18- pt does not feel independent with this     Time  4    Period  Weeks      PT LONG TERM GOAL #4   Title  Pt will increase left shoulder painless abduction to 165 degress so she can have easier functional mobility     Baseline  150, 10/31/18 - 155    Time  4    Period  Weeks    Status  On-going            Plan -  11/18/18 1811    Clinical Impression Statement  instructed in Strength ABC program for pt to do at home while she is limiting community exposure  during Coronavirus. She undertood it well and all questions were answered     PT Treatment/Interventions  ADLs/Self Care Home Management;DME Instruction;Therapeutic activities;Therapeutic exercise;Orthotic Fit/Training;Patient/family education;Manual techniques;Neuromuscular re-education;Manual lymph drainage;Compression bandaging;Scar mobilization;Taping;Passive range of motion    PT Next Visit Plan  Reassess, check goals for discharge        Patient will benefit from skilled therapeutic intervention in order to improve the following deficits and impairments:  Decreased knowledge of precautions, Pain, Impaired UE functional use, Decreased range of motion, Postural dysfunction, Decreased skin integrity, Increased fascial restricitons, Decreased scar mobility, Impaired perceived functional ability, Decreased strength, Decreased activity tolerance, Increased edema  Visit Diagnosis: Stiffness of left shoulder joint  Abnormal posture  Muscle weakness (generalized)  Disorder of the skin and subcutaneous tissue related to radiation, unspecified  Postmastectomy lymphedema     Problem List Patient Active Problem List   Diagnosis Date Noted  . Cancer of left female breast  ([place]) 06/13/2018  . Malignant neoplasm of overlapping sites of left breast in female, estrogen receptor positive ([place]) 05/09/2018  . Blurring of visual image of both eyes 12/06/2017  . Postherpetic neuralgia 12/06/2017  . BIPOLAR DISORDER UNSPECIFIED 10/10/2009  . ESOPHAGEAL REFLUX 10/10/2009   Donato Heinz. Owens Shark PT  [place]o 11/18/2018, [place], [place], [address] Phone: [phone]   Fax:  [phone]  Name: EMMAKATE HYPES MRN: [id number] Date of Birth: Dec 17, 1955

## 2018-11-21 ENCOUNTER — Encounter: Payer: 59 | Admitting: Physical Therapy

## 2018-11-25 MED FILL — TEMAZEPAM 30 MG CAPSULE: 30 | 30 days supply | Qty: 30 | Fill #2

## 2018-11-26 DIAGNOSIS — I890 Lymphedema, not elsewhere classified: Secondary | ICD-10-CM | POA: Diagnosis not present

## 2018-12-08 ENCOUNTER — Telehealth: Payer: Self-pay | Admitting: Physical Therapy

## 2018-12-08 MED FILL — ROSUVASTATIN CALCIUM 10 MG: 10 | 90 days supply | Qty: 90 | Fill #3

## 2018-12-08 MED FILL — OMEPRAZOLE 40 MG CPDR: 40 | 90 days supply | Qty: 90 | Fill #0

## 2018-12-08 NOTE — Telephone Encounter (Signed)
Note in error.  Please see other telephone note from this date  Dawn Booker, [name] 12/08/2018 @ 12:34 PM

## 2018-12-08 NOTE — Telephone Encounter (Signed)
Pt called asking for call back.  She received her Flexitouch by mail and had a virtual demonstration with Vicente Males via Zoom last Monday and she has been using it everyday since.  She says she has not been able to see much difference with it although she can feel it squeezing her upper arm and her hand may be a little puffy.   She did some gardening on Friday.  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  She is not able to consistently reproduce the pain with palpation or active motion.    Pt will make sure that her Flexitouch velcro straps are not pulled too tight especially in her upper arm.  Her pain could be musculoskeletal in origin with postural and radiation tightening influence. Reminded her to do the stretching exercise she has been taught and to take a brisk walk to stimulate her lymph system activation.  She will hold off on doing the Flexi today to see if it makes a difference in her symptoms. It could be the Flexi is not fitting well enough on her lateral chest toward her axilla.  Pt agreed to try these things.  I will check back in with her later this week.  Maudry Diego, PT 12/08/2018 @ 12:32 PM

## 2018-12-10 ENCOUNTER — Telehealth: Payer: Self-pay | Admitting: Physical Therapy

## 2018-12-10 NOTE — Telephone Encounter (Signed)
Called to check on patient.  She says she is not doing well.  She is still having pain and tightness in her left upper lateral trunk near the axilla.  She has not worn her Flexitouch to see if that was causing the pain and the pain has not decreased.  She will try the Flexi again today in a supine position to see if she can get better compression at the full area.    She is continuing to do ROM exercise.  Encouraged her to do manual lymph node stimulation at neck, axillae and inguinal areas as well as diaphragmatic breathing and walking to stimulate lymph flow.  She wants to come in for urgent lymphedema visit to further assess so appt at 10:00 am was tentatively made and message sent to our secretary to call and confirm appt in the morning.  Maudry Diego, PT 12/10/2018@ 2:39 PM

## 2018-12-11 ENCOUNTER — Other Ambulatory Visit: Payer: Self-pay

## 2018-12-11 ENCOUNTER — Ambulatory Visit: Payer: 59 | Attending: General Surgery | Admitting: Rehabilitation

## 2018-12-11 ENCOUNTER — Encounter: Payer: Self-pay | Admitting: Rehabilitation

## 2018-12-11 DIAGNOSIS — L599 Disorder of the skin and subcutaneous tissue related to radiation, unspecified: Secondary | ICD-10-CM | POA: Diagnosis not present

## 2018-12-11 DIAGNOSIS — R293 Abnormal posture: Secondary | ICD-10-CM | POA: Insufficient documentation

## 2018-12-11 DIAGNOSIS — I972 Postmastectomy lymphedema syndrome: Secondary | ICD-10-CM | POA: Insufficient documentation

## 2018-12-11 DIAGNOSIS — M6281 Muscle weakness (generalized): Secondary | ICD-10-CM | POA: Diagnosis not present

## 2018-12-11 DIAGNOSIS — M25612 Stiffness of left shoulder, not elsewhere classified: Secondary | ICD-10-CM | POA: Insufficient documentation

## 2018-12-11 DIAGNOSIS — Z483 Aftercare following surgery for neoplasm: Secondary | ICD-10-CM | POA: Diagnosis not present

## 2018-12-11 NOTE — Patient Instructions (Signed)
Access Code: [email or web address]  URL: [email or web address]  Date: 12/11/2018  Prepared by: Shan Levans   Exercises  Child's Pose Stretch - 10 reps - 1-3 sets - 20-30 seconds hold - 1x daily - 7x weekly  Standing Shoulder Posterior Capsule Stretch - 10 reps - 1-3 sets - 20-30 seconds hold - 1x daily - 7x weekly  Standing Thoracic Spine Stretch - 10 reps - 1-3 sets - 20-30 seconds hold - 1x daily - 7x weekly  Seated Lateral Trunk Stretch on Swiss Ball - 10 reps - 1-3 sets - 20-30 seconds hold - 1x daily - 7x weekly

## 2018-12-11 NOTE — Therapy (Addendum)
[place], [place], [address] Phone: [phone]   Fax:  [phone]  Physical Therapy Treatment  Patient Details  Name: Dawn Booker MRN: [id number] Date of Birth: 1955-12-14 Referring Provider (PT): Dr.Gudena    Encounter Date: 12/11/2018  PT End of Session - 12/11/18 1053    Visit Number  9    Number of Visits  9    PT Start Time  1004    PT Stop Time  1050    PT Time Calculation (min)  46 min    Activity Tolerance  Patient tolerated treatment well    Behavior During Therapy  [facility] for tasks assessed/performed       Past Medical History:  Diagnosis Date  . ADD (attention deficit disorder)   . Anxiety   . Cancer [facility])    breast cancer  . Depression   . GERD (gastroesophageal reflux disease)   . Hiatal hernia   . History of radiation therapy 08/13/2018- 09/25/2018   Left Chest wall and IM nodes/ 50 Gy in 25 fractions, Left supraclaicular fossa and PAB/ 50 Gy in 25 fractions, Left chest wall scar boost/ 10 Gy in 5 fractions.   . Hyperlipemia, mixed   . Intermittent palpitations   . Malignant neoplasm of overlapping sites of left breast in female, estrogen receptor positive ([place]) 05/06/2018   oncologist-  dr Lindi Adie--  Invasive Lobular Cancer, CIS, Stage IB, Grade 2 (pT3,pN1a,cM0), ER+---  s/p  left mastectomy with sln dissections and right mastectomy (benign)  . PONV (postoperative nausea and vomiting)   . Right thyroid nodule   . Shingles 12/2017    Past Surgical History:  Procedure Laterality Date  . AXILLARY LYMPH NODE DISSECTION Left 07/09/2018   Procedure: COMPLETION OF LEFT AXILLARY LYMPH NODE DISSECTION;  Surgeon: Jovita Kussmaul, MD;  [facility];  Service: General;  Laterality: Left;  . BREAST EXCISIONAL BIOPSY Left 1995   benign  . CARDIOVASCULAR STRESS TEST  06/21/2017   normal nuclear stress study w/ no ischemia/  normal LV function and wall function, nuclear stress ef 70%   . ELBOW SURGERY Left child  . MASTECTOMY W/ SENTINEL NODE BIOPSY Left 06/13/2018  . MASTECTOMY WITH RADIOACTIVE SEED GUIDED EXCISION AND AXILLARY SENTINEL LYMPH NODE BIOPSY Bilateral 06/13/2018   Procedure: LEFT MASTECTOMY WITH SENTINEL LYMPH NODE MAPPING AND TARGETED NODE DISSECTION AND RIGHT PROPHYLATIC MASTECTOMY;  Surgeon: Jovita Kussmaul, MD;  Location: [facility];  Service: General;  Laterality: Bilateral;  . THYROIDECTOMY Left 10-31-2001   dr gerkin _0   . TRANSTHORACIC ECHOCARDIOGRAM  06/21/2017   ef 60-65%/  trivial MR (no evidence mvp)  . TUBAL LIGATION  yrs ago  . VAGINAL HYSTERECTOMY  1990s    There were no vitals filed for this visit.  Subjective Assessment - 12/11/18 1005    Subjective  Some new pain since Sunday pointing to latissimus region.  Can you measure my arm.  The swelling has gotten a bit better.  I have not done the strength ABC but am stretching my arm is not swollen but more achy lately.     Pertinent History  Patient was diagnosed on 04/25/18 with left grade II invasive lobular carci1noma breast cancer. There are 2 areas: 2.6 cm in the upper outer quadrant and 6 mm in the upper inner quadrant. Both are ER/PR positive and HER2 negative with a Ki67 of 10% and she has a positive axillary node. Bilateral mastectomy on 06/13/2018 wiht 7 nodes removed  and the 10 in second surgery on 07/09/2018  She had radiation and did well. She has had some recent problem with left hip starting in [place].  She has possible labral tear ?? she will received a steroid shot tomorrow     Currently in Pain?  No/denies   it hurt yesterday for the last time        [facility] PT Assessment - 12/11/18 0001      AROM   Left Shoulder Flexion  152 Degrees    Left Shoulder ABduction  165 Degrees    Left Shoulder Internal Rotation  --   behind the back to L5   Left Shoulder External Rotation  --   behind the head 90%     Strength   Overall Strength Comments  seated Lt UE MMT 4-/5 all motions         LYMPHEDEMA/ONCOLOGY QUESTIONNAIRE - 12/11/18 1023      Left Upper Extremity Lymphedema   15 cm Proximal to Olecranon Process  30.2 cm    10 cm Proximal to Olecranon Process  27 cm    Olecranon Process  23.2 cm    10 cm Proximal to Ulnar Styloid Process  19.3 cm    Just Proximal to Ulnar Styloid Process  15.2 cm    Across Hand at [employer]  19.2 cm    At [place] of 2nd Digit  5.9 cm                [facility] Adult PT Treatment/Exercise - 12/11/18 0001      Exercises   Other Exercises   decreased frequency of theraband supine scap exercises to 3-4x per week and added some stretches to perform QD.  Given new HEP additions of childs pose normal or at the counter, wall lat stretch, and posterior capsule across body stretch.  each performed x 1 with cueing.        Manual Therapy   Manual therapy comments  pt encouraged to start wearing her sleeve QD for atleast 1-2 weeks to see if it changes the upper arm discomfort.  Also educated on continueing her self MLD and flexitouch use and to contact [place] with any problems with the flexitouch.               PT Education - 12/11/18 1052    Education Details  sig education on post radiation effects, shoulder dysfunction after surgery, latissimus anatomy and soreness and exercise progression     Person(s) Educated  Patient    Methods  Explanation;Demonstration;Verbal cues    Comprehension  Verbalized understanding;Verbal cues required       PT Short Term Goals - 11/18/18 1813      PT SHORT TERM GOAL #1   Title  Pt will verbalize lymphedema risk reduction practices     Status  Achieved      PT SHORT TERM GOAL #2   Title  Pt will have 125 degrees if painless left shoulder abduction so that she can receive radiation     Status  Achieved      PT SHORT TERM GOAL #3   Title  Pt will be independent in a home exercise program for shoulder range of motion     Status  Achieved        PT Long Term Goals - 10/31/18 0853       PT LONG TERM GOAL #1   Title  Pt will report the pain and discomfort in her left  upper quadrant is decreased by 50%     Baseline  10/31/18- 95%    Time  4    Period  Weeks    Status  Achieved      PT LONG TERM GOAL #2   Title  Pt will be independent in a home exercise program for strengthening     Time  4    Period  Weeks    Status  On-going      PT LONG TERM GOAL #3   Title  Pt will report she is able to manage her symptoms of lymphedema on her own at home    Baseline  10/31/18- pt does not feel independent with this     Time  4    Period  Weeks      PT LONG TERM GOAL #4   Title  Pt will increase left shoulder painless abduction to 165 degress so she can have easier functional mobility     Baseline  150, 10/31/18 - 155    Time  4    Period  Weeks    Status  On-going            Plan - 12/11/18 1053    Clinical Impression Statement  Pt arrives with new latissimus soreness most likely after gardening.  Reports that it is already pretty much gone today.  Circumferential measurements slightly up but not significantly and pt was encouraged to wear her sleeve for now as she feels it is achy.  Demonstrated to pt the weakness of the Lt hsoulder and encouraged her to keep up with her exericses.  Educated on latissimus stretches for home.  Pt agreeable to this one visit and will return if needed.  Overall reporting the trunk swelling has improved since last session.      PT Next Visit Plan  Reassessif return       Patient will benefit from skilled therapeutic intervention in order to improve the following deficits and impairments:     Visit Diagnosis: Stiffness of left shoulder joint  Abnormal posture  Muscle weakness (generalized)  Disorder of the skin and subcutaneous tissue related to radiation, unspecified  Aftercare following surgery for neoplasm  Postmastectomy lymphedema     Problem List Patient Active Problem List   Diagnosis Date Noted  . Cancer of left female  breast  ([place]) 06/13/2018  . Malignant neoplasm of overlapping sites of left breast in female, estrogen receptor positive ([place]) 05/09/2018  . Blurring of visual image of both eyes 12/06/2017  . Postherpetic neuralgia 12/06/2017  . BIPOLAR DISORDER UNSPECIFIED 10/10/2009  . ESOPHAGEAL REFLUX 10/10/2009    Shan Levans, PT 12/11/2018, 10:56 AM  [place], [place], [address] Phone: [phone]   Fax:  [phone]  Name: KAYLEE TRIVETT MRN: [id number] Date of Birth: 05/10/1956   12/31/18 PT responded to pt's mychart message regarding her increasing shoulder stiffness.  Pt presenting with signs and symptoms now of adhesive capsulitis with decreasing AROM.  PT updated stretches via [place] with pt able to access at home.  PT advised pt that PT is available for any appointments as well as orthopedic visit as needed for injection with pt not wanting to leave her home at this time due to COVID-19.    Shan Levans, PT

## 2018-12-12 ENCOUNTER — Telehealth: Payer: Self-pay | Admitting: *Deleted

## 2018-12-12 NOTE — Telephone Encounter (Signed)
Late entry 12/10/18  Pt called with concerns of increased lymphedema. Pt does have a flexi touch which she uses every day x1hr and does not feel this is helping. D/t the pain and discomfort she is experiencing from lymphedema she feels she needs to go out on disability again d/t inability to work effectively with increased lymphedema and pain/discomfort caused from lymphedema. Pt informed she worked out in her garden and feels she may have "over did it".  Pt relate she does have an appt on 4/9 to see PT for lymphedema assessment.  Pt having emotional difficulty r/t her wanting to "get back to normal". Discussed with pt that she is still recovering from her surgery and extensive radiation and not to expect herself to be fully recovers so quickly after treatment. Gave encouragement and emotional support. Encourage pt to call with needs or questions. Received verbal understanding.

## 2018-12-16 ENCOUNTER — Telehealth: Payer: Self-pay | Admitting: Rehabilitation

## 2018-12-16 NOTE — Telephone Encounter (Signed)
Pt called today regarding the increased ache in her shoulder.  Noting that it started after using the pump incorrectly where it was really squeezing the upper arm significantly.  Yesterday she was in touch with the pump representative who helped her adjust the settings.  Pt was advised to use the pump this way for the rest of the week as well as self MLD to the upper arm, and if it is not better by next week doing a trial of no pump for the week.

## 2018-12-22 ENCOUNTER — Encounter: Payer: Self-pay | Admitting: *Deleted

## 2018-12-29 MED FILL — TEMAZEPAM 30 MG CAPSULE: 30 | 30 days supply | Qty: 30 | Fill #0

## 2018-12-30 ENCOUNTER — Encounter: Payer: Self-pay | Admitting: Rehabilitation

## 2018-12-31 ENCOUNTER — Telehealth: Payer: Self-pay | Admitting: *Deleted

## 2018-12-31 NOTE — Telephone Encounter (Signed)
Pt called with c/o added anxiety and depression since taking Tamoxifen and feels she is not tolerating well. Instructed pt to stop taking x2wk and will f/u with Dr. Lindi Adie regarding new anti-estrogen oral therapy. Received verbal understanding. Dr. Lindi Adie notified.

## 2019-01-01 ENCOUNTER — Telehealth: Payer: Self-pay | Admitting: Hematology and Oncology

## 2019-01-01 ENCOUNTER — Telehealth: Payer: Self-pay | Admitting: *Deleted

## 2019-01-01 NOTE — Telephone Encounter (Signed)
Pt called and request earlier appt to discuss severe symptoms r/s tamoxifen. Pt with extreme fatigue to the point of her "husband has to take care of me", increased anxiety and depression. R/s appt to 5/1 at 9:45 for webex. Scheduled and confirmed appt.

## 2019-01-01 NOTE — Telephone Encounter (Signed)
Called regarding upcoming Webex appointment, patient is notified and e-mail sent.

## 2019-01-01 NOTE — Progress Notes (Signed)
HEMATOLOGY-ONCOLOGY [place] VISIT PROGRESS NOTE  I connected with Dawn Booker on 01/02/2019 at  9:45 AM EDT by Webex video conference and verified that I am speaking with the correct person using two identifiers.  I discussed the limitations, risks, security and privacy concerns of performing an evaluation and management service by Webex and the availability of in person appointments.  I also discussed with the patient that there may be a patient responsible charge related to this service. The patient expressed understanding and agreed to proceed.   Patient's Location: Home Physician Location: Clinic  CHIEF COMPLIANT: Follow-up to discuss alternate anti-estrogen therapies   INTERVAL HISTORY: Dawn Booker is a 63 y.o. female with above-mentioned history of left breast cancer who underwent bilateral mastectomies and radiation, and is currently on anti-estrogen therapy with tamoxifen. Tamoxifen was stopped on 12/31/18 due to increased anxiety and depression and severe fatigue. She presents today over Webex to discuss alternate anti-estrogen therapies.  Patient has had profound difficulties with recovery from the breast cancer. She complains of frozen left shoulder with difficulty with moving her arm.  It is also causing her significant pain. There is significant amount of lymphedema in the chest related to prior surgery. She has very profound depression and anxiety symptoms for which she takes round-the-clock Xanax to calm herself down.  This has been accentuated because of tamoxifen.  We have discontinued tamoxifen and still her symptoms are persistent.  Because of all of this she is having difficulty with sleep and having difficulty with daily activities as well.    Malignant neoplasm of overlapping sites of left breast in female, estrogen receptor positive ([place])   05/06/2018 Initial Diagnosis    Screening detected left breast mass anteriorly 1030 to 11 o'clock position 2 masses 6 mm and 7 mm,  12:30 position 2.6 cm both of these masses biopsy-proven grade 2 invasive lobular cancer ER 100%, PR 10%, Ki-67 15%, HER-2 negative 1+ by IHC, lymph node positive for malignancy T2N1 stage IIa clinical stage    05/21/2018 Cancer Staging    Staging form: Breast, AJCC 8th Edition - Pathologic: Stage IB (pT3, pN1a, cM0, G2, ER+, PR+, HER2-) - Signed by Nicholas Lose, MD on 06/24/2018    06/03/2018 Miscellaneous    Genetics negative    06/13/2018 Surgery    Bilateral mastectomies: Left: ILC, grade 2, 5.4 cm, LCIS, margins negative, lymphovascular invasion present, 2/7 lymph nodes positive with extranodal extension (1 additional lymph node had isolated tumor cells), right mastectomy benign; T3N1a ER 100%, PR 10%, HER-2 -1+, Ki-67 10 to 15%, stage Ib    08/06/2018 - 09/25/2018 Radiation Therapy    Adjuvant XRT    10/09/2018 -  Anti-estrogen oral therapy    Anastrozole 26m daily, plan for 7 years; switched to tamoxifen 11/07/18 for bone health     REVIEW OF SYSTEMS:   Constitutional: Denies fevers, chills or abnormal weight loss Eyes: Denies blurriness of vision Ears, nose, mouth, throat, and face: Denies mucositis or sore throat Respiratory: Denies cough, dyspnea or wheezes Cardiovascular: Denies palpitation, chest discomfort Gastrointestinal:  Denies nausea, heartburn or change in bowel habits Skin: Denies abnormal skin rashes Lymphatics: Left-sided lymphedema of the chest Neurological:Denies numbness, tingling or new weaknesses Behavioral/Psych: Mood is stable, no new changes  Extremities: Frozen left shoulder   All other systems were reviewed with the patient and are negative.  Observations/Objective:  Patient is in market distress from multiple physical social and emotional issues. She has market limitation of range of motion of  the left shoulder. There is severe depression/anxiety.  CMP Latest Ref Rng & Units 07/04/2018 06/06/2018 05/21/2018  Glucose 70 - 99 mg/dL 92 79 87  BUN 8 - 23  mg/dL _0 Creatinine 0.44 - 1.00 mg/dL 0.76 0.76 0.94  Sodium 135 - 145 mmol/L 140 139 142  Potassium 3.5 - 5.1 mmol/L 3.5 3.6 3.8  Chloride 98 - 111 mmol/L 109 107 105  CO2 22 - 32 mmol/L _1 Calcium 8.9 - 10.3 mg/dL 8.8(L) 9.2 9.5  Total Protein 6.5 - 8.1 g/dL - - 7.4  Total Bilirubin 0.3 - 1.2 mg/dL - - 0.5  Alkaline Phos 38 - 126 U/L - - 73  AST 15 - 41 U/L - - 16  ALT 0 - 44 U/L - - 17    Lab Results  Component Value Date   WBC 6.4 07/04/2018   HGB 12.6 07/04/2018   HCT 39.9 07/04/2018   MCV 98.5 07/04/2018   PLT 202 07/04/2018   NEUTROABS 4.1 05/21/2018      Assessment Plan:  Malignant neoplasm of overlapping sites of left breast in female, estrogen receptor positive ([place]) 06/13/2018:Bilateral mastectomies: Left: ILC, grade 2, 5.4 cm, LCIS, margins negative, lymphovascular invasion present, 2/7 lymph nodes positive with extranodal extension (1 additional lymph node had isolated tumor cells), right mastectomy benign; T3N1a ER 100%, PR 10%, HER-2 -1+, Ki-67 10 to 15%, stage Ib MammaPrint: Low risk, luminal type a Adjuvant radiation therapy: 08/14/2018- MRI back: L4 and L5 small lesions could not rule out metastatic disease.  PET CT negative for bone metastases.  Treatment: Tamoxifen 20 mg daily (patient did not want to take anastrozole because of osteoporosis.)  Started 11/07/2018-stopped 01/01/2019 due to profound depression/anxiety  I discussed with her different options including reducing the dosage of tamoxifen versus switching her to anastrozole therapy.  We can treat the osteoporosis with bisphosphonates.  Patient will start anastrozole on March 04, 2019.  I instructed her to take a 90-monthbreak from antiestrogen treatments to get better emotionally and physically.  She will also receive Prolia injections with her orthopedic doctors.  She will take calcium and vitamin D as well.  Osteoporosis: T score -3.1  Multiple health issues including frozen left  shoulder and lymphedema of the chest and depression and anxiety causing her limitation in activities of daily living.  She is absolutely unable to function and cannot go to work.  I discussed with her about taking long-term disability.  She will request a new set of paperwork for [place] fill.  Return to clinic in 3 months for follow-up and for survivorship visit with LMendel Ryder    I discussed the assessment and treatment plan with the patient. The patient was provided an opportunity to ask questions and all were answered. The patient agreed with the plan and demonstrated an understanding of the instructions. The patient was advised to call back or seek an in-person evaluation if the symptoms worsen or if the condition fails to improve as anticipated.   I provided 21 minutes of face-to-face Web Ex time during this encounter.    VRulon Eisenmenger MD 01/02/2019   I, Molly Dorshimer, am acting as scribe for VNicholas Lose MD.  I have reviewed the above documentation for accuracy and completeness, and I agree with the above.

## 2019-01-02 ENCOUNTER — Inpatient Hospital Stay: Payer: 59 | Attending: Hematology and Oncology | Admitting: Hematology and Oncology

## 2019-01-02 DIAGNOSIS — Z79810 Long term (current) use of selective estrogen receptor modulators (SERMs): Secondary | ICD-10-CM | POA: Diagnosis not present

## 2019-01-02 DIAGNOSIS — C50812 Malignant neoplasm of overlapping sites of left female breast: Secondary | ICD-10-CM | POA: Diagnosis not present

## 2019-01-02 DIAGNOSIS — Z170 Estrogen receptor positive status [ER+]: Secondary | ICD-10-CM

## 2019-01-02 NOTE — Assessment & Plan Note (Signed)
06/13/2018:Bilateral mastectomies: Left: ILC, grade 2, 5.4 cm, LCIS, margins negative, lymphovascular invasion present, 2/7 lymph nodes positive with extranodal extension (1 additional lymph node had isolated tumor cells), right mastectomy benign; T3N1a ER 100%, PR 10%, HER-2 -1+, Ki-67 10 to 15%, stage Ib MammaPrint: Low risk, luminal type a Adjuvant radiation therapy: 08/14/2018- MRI back: L4 and L5 small lesions could not rule out metastatic disease.  PET CT negative for bone metastases.  Treatment: Tamoxifen 20 mg daily (patient did not want to take anastrozole because of osteoporosis.)  Started 11/07/2018-stopped 01/01/2019 due to profound fatigue  Tamoxifen toxicities: 1.  Severe fatigue to the point that her husband has to take care of her. 2.   I discussed with her different options including reducing the dosage of tamoxifen versus switching her to anastrozole therapy.  We can treat the osteoporosis with bisphosphonates.  Return to clinic in 3 months for follow-up

## 2019-01-05 ENCOUNTER — Other Ambulatory Visit: Payer: Self-pay | Admitting: Hematology and Oncology

## 2019-01-05 MED ORDER — LORAZEPAM 0.5 MG PO TABS: 0.5000 mg | ORAL_TABLET | Freq: Three times a day (TID) | ORAL | 0 refills | Status: DC | PRN

## 2019-01-05 MED FILL — LORazepam 0.5 MG TABS: 0.5 | 10 days supply | Qty: 30 | Fill #0

## 2019-01-05 MED FILL — buPROPion HCL ER (XL) 150 M: 150 | 90 days supply | Qty: 90 | Fill #1

## 2019-01-05 MED FILL — FLUCONAZOLE 150 MG TABS: 150 | 7 days supply | Qty: 3 | Fill #0

## 2019-01-15 ENCOUNTER — Ambulatory Visit: Payer: 59 | Admitting: Hematology and Oncology

## 2019-01-20 DIAGNOSIS — E559 Vitamin D deficiency, unspecified: Secondary | ICD-10-CM | POA: Diagnosis not present

## 2019-01-20 DIAGNOSIS — M1612 Unilateral primary osteoarthritis, left hip: Secondary | ICD-10-CM | POA: Diagnosis not present

## 2019-01-20 DIAGNOSIS — M810 Age-related osteoporosis without current pathological fracture: Secondary | ICD-10-CM | POA: Diagnosis not present

## 2019-01-20 DIAGNOSIS — R5383 Other fatigue: Secondary | ICD-10-CM | POA: Diagnosis not present

## 2019-01-23 ENCOUNTER — Encounter: Payer: 59 | Admitting: Adult Health

## 2019-01-27 MED FILL — TEMAZEPAM 30 MG CAPSULE: 30 | 30 days supply | Qty: 30 | Fill #1

## 2019-02-09 MED FILL — buPROPion HCL ER (XL) 300 M: 300 | 90 days supply | Qty: 90 | Fill #0

## 2019-02-10 DIAGNOSIS — C50112 Malignant neoplasm of central portion of left female breast: Secondary | ICD-10-CM | POA: Diagnosis not present

## 2019-02-10 DIAGNOSIS — Z853 Personal history of malignant neoplasm of breast: Secondary | ICD-10-CM | POA: Diagnosis not present

## 2019-02-11 DIAGNOSIS — Z170 Estrogen receptor positive status [ER+]: Secondary | ICD-10-CM | POA: Diagnosis not present

## 2019-02-11 DIAGNOSIS — C50112 Malignant neoplasm of central portion of left female breast: Secondary | ICD-10-CM | POA: Diagnosis not present

## 2019-02-24 MED FILL — TEMAZEPAM 30 MG CAPSULE: 30 | 30 days supply | Qty: 30 | Fill #2

## 2019-03-02 ENCOUNTER — Encounter: Payer: 59 | Admitting: Adult Health

## 2019-03-02 ENCOUNTER — Other Ambulatory Visit: Payer: Self-pay | Admitting: Hematology and Oncology

## 2019-03-04 ENCOUNTER — Telehealth: Payer: Self-pay | Admitting: Rehabilitation

## 2019-03-04 ENCOUNTER — Other Ambulatory Visit: Payer: Self-pay

## 2019-03-04 ENCOUNTER — Ambulatory Visit: Payer: 59 | Attending: General Surgery | Admitting: Rehabilitation

## 2019-03-04 ENCOUNTER — Encounter: Payer: Self-pay | Admitting: Rehabilitation

## 2019-03-04 DIAGNOSIS — M6281 Muscle weakness (generalized): Secondary | ICD-10-CM | POA: Insufficient documentation

## 2019-03-04 DIAGNOSIS — R293 Abnormal posture: Secondary | ICD-10-CM | POA: Insufficient documentation

## 2019-03-04 DIAGNOSIS — M25612 Stiffness of left shoulder, not elsewhere classified: Secondary | ICD-10-CM | POA: Insufficient documentation

## 2019-03-04 MED FILL — LORazepam 0.5 MG TABS: 0.5 | 10 days supply | Qty: 30 | Fill #0

## 2019-03-04 NOTE — Therapy (Signed)
[place], [place], [address] Phone: [phone]   Fax:  [phone]  Physical Therapy Treatment  Patient Details  Name: Dawn Booker MRN: [id number] Date of Birth: 23-Feb-1956 Referring Provider (PT): Dr.Gudena    Encounter Date: 03/04/2019  PT End of Session - 03/04/19 1022    Visit Number  10    Date for PT Re-Evaluation  04/29/19    PT Start Time  0932    PT Stop Time  1016    PT Time Calculation (min)  44 min    Behavior During Therapy  [facility] for tasks assessed/performed       Past Medical History:  Diagnosis Date  . ADD (attention deficit disorder)   . Anxiety   . Cancer [facility])    breast cancer  . Depression   . GERD (gastroesophageal reflux disease)   . Hiatal hernia   . History of radiation therapy 08/13/2018- 09/25/2018   Left Chest wall and IM nodes/ 50 Gy in 25 fractions, Left supraclaicular fossa and PAB/ 50 Gy in 25 fractions, Left chest wall scar boost/ 10 Gy in 5 fractions.   . Hyperlipemia, mixed   . Intermittent palpitations   . Malignant neoplasm of overlapping sites of left breast in female, estrogen receptor positive ([place]) 05/06/2018   oncologist-  dr Lindi Adie--  Invasive Lobular Cancer, CIS, Stage IB, Grade 2 (pT3,pN1a,cM0), ER+---  s/p  left mastectomy with sln dissections and right mastectomy (benign)  . PONV (postoperative nausea and vomiting)   . Right thyroid nodule   . Shingles 12/2017    Past Surgical History:  Procedure Laterality Date  . AXILLARY LYMPH NODE DISSECTION Left 07/09/2018   Procedure: COMPLETION OF LEFT AXILLARY LYMPH NODE DISSECTION;  Surgeon: Jovita Kussmaul, MD;  Location: [facility];  Service: General;  Laterality: Left;  . BREAST EXCISIONAL BIOPSY Left 1995   benign  . CARDIOVASCULAR STRESS TEST  06/21/2017   normal nuclear stress study w/ no ischemia/  normal LV function and wall function, nuclear stress ef 70%  . ELBOW SURGERY Left child  .  MASTECTOMY W/ SENTINEL NODE BIOPSY Left 06/13/2018  . MASTECTOMY WITH RADIOACTIVE SEED GUIDED EXCISION AND AXILLARY SENTINEL LYMPH NODE BIOPSY Bilateral 06/13/2018   Procedure: LEFT MASTECTOMY WITH SENTINEL LYMPH NODE MAPPING AND TARGETED NODE DISSECTION AND RIGHT PROPHYLATIC MASTECTOMY;  Surgeon: Jovita Kussmaul, MD;  Location: [place];  Service: General;  Laterality: Bilateral;  . THYROIDECTOMY Left 10-31-2001   dr gerkin _0   . TRANSTHORACIC ECHOCARDIOGRAM  06/21/2017   ef 60-65%/  trivial MR (no evidence mvp)  . TUBAL LIGATION  yrs ago  . VAGINAL HYSTERECTOMY  1990s    There were no vitals filed for this visit.  Subjective Assessment - 03/04/19 0933    Subjective  Pt returns to the clinic today since COVID-19 break.  We need to evaluate my shoulder again.  Also my sleeve does not seem the fit. The shoulder is still not moving well.  Doing exercises was increasing the pain. It was like a toothache.    Pertinent History  Patient was diagnosed on 04/25/18 with left grade II invasive lobular carci1noma breast cancer. There are 2 areas: 2.6 cm in the upper outer quadrant and 6 mm in the upper inner quadrant. Both are ER/PR positive and HER2 negative with a Ki67 of 10% and she has a positive axillary node. Bilateral mastectomy on 06/13/2018 wiht 7 nodes removed and the 10 in second surgery on 07/09/2018  She had radiation and did well.    Patient Stated Goals  to be able to get range of motion back in her arm    Currently in Pain?  No/denies   pain only with movement the shoulder and whole arm   Pain Score  9     Pain Location  Shoulder    Pain Orientation  Left    Pain Descriptors / Indicators  Burning;Aching    Pain Type  Chronic pain    Pain Onset  More than a month ago    Pain Frequency  Intermittent    Aggravating Factors   sudden movements    Pain Relieving Factors  medication if needed         [facility] PT Assessment - 03/04/19 0001      AROM   Left Shoulder Extension  25 Degrees    pain   Left Shoulder Flexion  120 Degrees   pain   Left Shoulder ABduction  120 Degrees   pain   Left Shoulder Internal Rotation  50 Degrees    Left Shoulder External Rotation  50 Degrees   sharp pain; reproduces pain       LYMPHEDEMA/ONCOLOGY QUESTIONNAIRE - 03/04/19 0945      Left Upper Extremity Lymphedema   15 cm Proximal to Olecranon Process  30.4 cm    10 cm Proximal to Olecranon Process  26.7 cm    Olecranon Process  23.5 cm    15 cm Proximal to Ulnar Styloid Process  22.7 cm    10 cm Proximal to Ulnar Styloid Process  19.9 cm    Just Proximal to Ulnar Styloid Process  15.3 cm    Across Hand at [employer]  18.7 cm    At [place] of 2nd Digit  6 cm                [facility] Adult PT Treatment/Exercise - 03/04/19 0001      Exercises   Other Exercises   pt still does not want to come into the clinic for manual work since it is too painful.  Talked about just continuing to stretch as much as possible at home and to discuss a cortisone injection with her orthpedic MD as Dr. Marlou Starks has also apparently recommended.        Manual Therapy   Manual therapy comments  remeasured Lt UE with no changes since april.  According to the size chart pts sleeve size is too small so I fitted her for a lymphedivas sleeve today size small short and emailed Melissa regarding gauntlet fit.                 PT Short Term Goals - 11/18/18 1813      PT SHORT TERM GOAL #1   Title  Pt will verbalize lymphedema risk reduction practices     Status  Achieved      PT SHORT TERM GOAL #2   Title  Pt will have 125 degrees if painless left shoulder abduction so that she can receive radiation     Status  Achieved      PT SHORT TERM GOAL #3   Title  Pt will be independent in a home exercise program for shoulder range of motion     Status  Achieved        PT Long Term Goals - 03/04/19 1025      PT LONG TERM GOAL #1   Title  Pt will report the  pain and discomfort in her left upper  quadrant is decreased by 50%     Status  Not Met      PT LONG TERM GOAL #2   Title  Pt will be independent in a home exercise program for strengthening     Status  Achieved      PT LONG TERM GOAL #3   Title  Pt will report she is able to manage her symptoms of lymphedema on her own at home    Status  Achieved      PT LONG TERM GOAL #4   Title  Pt will increase left shoulder painless abduction to 165 degress so she can have easier functional mobility     Status  Not Met            Plan - 03/04/19 1023    Clinical Impression Statement  Pt returns with significantly worse ROM/adhesive capulitis reporting she can't stretch at home because it is too painful.  Encouraged pt to restart PT but pt seems to want to avoid all pain at this point despite worsening ROM.  ALso talked about an injection as Dr. Marlou Starks has also recommended per pt.  Resized pt for a better fitting glove.  No more appts at this time per pt request.    PT Treatment/Interventions  ADLs/Self Care Home Management;DME Instruction;Therapeutic activities;Therapeutic exercise;Orthotic Fit/Training;Patient/family education;Manual techniques;Neuromuscular re-education;Manual lymph drainage;Compression bandaging;Scar mobilization;Taping;Passive range of motion    PT Next Visit Plan  Reassessif return       Patient will benefit from skilled therapeutic intervention in order to improve the following deficits and impairments:     Visit Diagnosis: 1. Stiffness of left shoulder joint   2. Abnormal posture   3. Muscle weakness (generalized)        Problem List Patient Active Problem List   Diagnosis Date Noted  . Cancer of left female breast  ([place]) 06/13/2018  . Malignant neoplasm of overlapping sites of left breast in female, estrogen receptor positive ([place]) 05/09/2018  . Blurring of visual image of both eyes 12/06/2017  . Postherpetic neuralgia 12/06/2017  . BIPOLAR DISORDER UNSPECIFIED 10/10/2009  . ESOPHAGEAL REFLUX  10/10/2009    Stark Bray 03/04/2019, 10:26 AM  [place], [place], [address] Phone: [phone]   Fax:  [phone]  Name: Dawn Booker MRN: [id number] Date of Birth: 20-Nov-1955

## 2019-03-04 NOTE — Telephone Encounter (Signed)
LM regarding correct gauntlet size of Small

## 2019-03-04 NOTE — Patient Instructions (Signed)
How to order lymphedivas sleeve online Continue to stretch and consider PT or injection

## 2019-03-09 MED FILL — OMEPRAZOLE 40 MG CPDR: 40 | 90 days supply | Qty: 90 | Fill #0

## 2019-03-12 ENCOUNTER — Other Ambulatory Visit: Payer: Self-pay

## 2019-03-12 DIAGNOSIS — E782 Mixed hyperlipidemia: Secondary | ICD-10-CM

## 2019-03-12 DIAGNOSIS — Z0000 Encounter for general adult medical examination without abnormal findings: Secondary | ICD-10-CM

## 2019-03-16 ENCOUNTER — Telehealth: Payer: Self-pay | Admitting: *Deleted

## 2019-03-16 ENCOUNTER — Telehealth: Payer: Self-pay

## 2019-03-16 NOTE — Telephone Encounter (Signed)
-----   Message from Nicholaus Corolla sent at 03/12/2019  2:35 PM EDT ----- Scheduling Message Entered by Lenox Ponds E on 03/12/2019 at 2:29 PM Priority: High <No visit type provided> Department: [facility] Provider:  Appointment Notes: Please schedule lab appointment for above Pt for 04/13/19 before lindsey's appointment. Please call Pt. with appointment time. ;) Thank you  Added lab appt - can someone call pt back to let her know if she needs to fast or not for the labs

## 2019-03-16 NOTE — Telephone Encounter (Signed)
T/C to Pt. to inform her that she has to be fasting for lab work, informed her that she may bring some to eat after she gets her lab work.

## 2019-03-16 NOTE — Telephone Encounter (Signed)
Received msg from pt stating has had nausea with increase depression and anxiety. Stopped taking anastrozole on Friday feeling as if it may be a s/e. Left vm for pt to return call to discuss symptoms. Contact information provided.

## 2019-03-18 ENCOUNTER — Encounter: Payer: Self-pay | Admitting: *Deleted

## 2019-03-19 ENCOUNTER — Telehealth: Payer: Self-pay | Admitting: *Deleted

## 2019-03-19 ENCOUNTER — Encounter: Payer: Self-pay | Admitting: General Practice

## 2019-03-19 NOTE — Telephone Encounter (Signed)
Pt called with c/o increased depression and anxiety since taking anastrozole. Referral made to SW. Pt requests appt with Dr. Lindi Adie to discuss symptoms. Scheduled and confirmed appt for 7/17 at 11:15

## 2019-03-19 NOTE — Progress Notes (Signed)
[facility] CSW Progress Notes  [facility] [facility] Psychosocial Assessment Clinical Social Work  Clinical Social Work was referred by Secretary/administrator.  Due to COVID 19 restrictions, met with patient by phone to assess psychosocial, emotional, mental health, and spiritual needs nurse navigator to of the patient.  Patient's knowledge about cancer and its treatment including level of understanding, reactions, goals for care, and expectations:  Has completed chemo, radiation, double mastectomy, removal of axillary nodes,  after initial diagnosis of breast cancer in Sept 2019.  Completed radiation and returned to work at [employer] office from Feb - April 2020.  Prior to cancer diagnosis, had been faithful w recommended surveillance including annual mammograms.  However, was caring for elderly mother on weekends in addition to working full time and did not get mammograms for 3 years.  Feels that is she had not been "distracted" by caring for mother, would have had cancer identified earlier and treatment might not have been so arduous.  Also prior to cancer diagnosis, had multiple stressors including prolonged period of caring for needs of elderly mother, mother's death, multiple deaths of close family members including cousins and cousins in law.  Feels that she has not been able to grieve or process these losses because cancer diagnosis/treatment was all consuming and overwhelming.  Has struggled w being able to work - found herself having difficulty concentrating, in pain due to lymphedema and extremely fatigued from radiation and its aftereffects.  Is not currently working and is working on request for disability.  Feels like she is only now able to begin to deal with and process the trauma of cancer diagnosis and treatment, has been significantly overwhelmed.  Finds it difficult to concentrate.  Sleep has been frequently interrupted by multiple awakenings.  Wakes w worry about various stressors.   Describes mood as both anxious and depressed.  Depression marked by inability to "get anything done", "just do not feel like doing anything."  Describes self has having "lost my way and I dont know how to find it again."  Previous methods of coping such as work and exercise are now unavailable.  Little exercise tolerance, also has torn labrum which needs surgery.    Characteristics of the patient's support system:  Married, lives with husband of 59 years who is supportive.  Pt feels he may be troubled by patient's frequent and unpredictable mood swings, "He doesn't know what he will get next." Has adult daughter who has been supportive/engaged.  However, adult son has had little/no contact w patient after having argument w father.  Patient has grandchildren that she has been unable to see which is a significant sorrow. Several friends have also recently been diagnosed w cancer, although their cancer journey has been different, pt has been able to connect with them on some levels.     Patient and family psychosocial functioning including strengths, limitations, and coping skills:  In the past, has been able to solve problems for self and others, had energy and motivation.   Has lost these in context of multiple losses, cancer diagnosis, treatment and recovery.  Has not "bounced back like I used to."  Describes self as an "introvert" but has several friends she connects with.    Identifications of barriers to care:  Unclear when/whether she will be able to return to work, limitations on income as result of not working.  Lack of clear positive direction for life - unable to articulate what she wants/how life will be different.  Depression marked by depressed mood, alterations in sleep, inability to concentrate, anhedonia.  Coping with changes in body post mastectomy.  Difficulty coping w negative side effects of treatment.   Availability of community resources:  [facility] all have  online programming that will increase social support and provide links to other survivors.  CSW will meet w patient 2 - 3 times before deciding whether patient would like referral to community counselor for ongoing support.    Clinical Social Worker follow up needed: Yes.    If yes, follow up plan:  Meet w pt by phone 7/22 at 11 AM to follow up.  Materials sent w options for various support modalities - patient asked to review and pick one option to try.  Will review and discuss at next meeting.  Edwyna Shell, LCSW Clinical Social Worker Phone:  [phone]

## 2019-03-20 ENCOUNTER — Telehealth: Payer: Self-pay | Admitting: Adult Health

## 2019-03-20 ENCOUNTER — Ambulatory Visit (HOSPITAL_BASED_OUTPATIENT_CLINIC_OR_DEPARTMENT_OTHER): Payer: 59 | Admitting: Hematology and Oncology

## 2019-03-20 DIAGNOSIS — C50812 Malignant neoplasm of overlapping sites of left female breast: Secondary | ICD-10-CM

## 2019-03-20 DIAGNOSIS — Z170 Estrogen receptor positive status [ER+]: Secondary | ICD-10-CM

## 2019-03-20 NOTE — Telephone Encounter (Signed)
I talk with patient regarding schedule  

## 2019-03-20 NOTE — Assessment & Plan Note (Signed)
  06/13/2018:Bilateral mastectomies: Left: ILC, grade 2, 5.4 cm, LCIS, margins negative, lymphovascular invasion present, 2/7 lymph nodes positive with extranodal extension (1 additional lymph node had isolated tumor cells), right mastectomy benign; T3N1a ER 100%, PR 10%, HER-2 -1+, Ki-67 10 to 15%, stage Ib MammaPrint: Low risk, luminal type a Adjuvant radiation therapy: 08/14/2018- MRI back: L4 and L5 small lesions could not rule out metastatic disease. PET CT negative for bone metastases.  Treatment: Tamoxifen 20 mg daily(patient did not want to take anastrozole because of osteoporosis.)Started 11/07/2018-stopped 01/01/2019 due to profound depression/anxiety Took anastrozole 03/04/2019- 03/18/2019: Discontinued because of profound worsening of depression and anxiety.  Plan:  Discontinue antiestrogen therapy for a month  switch back to tamoxifen and will start at 5 mg daily for a month and then increase it to 10 mg daily.  We discussed the side effects once again. Sent a new prescription today  Severe disability: Due to mental health issues.  She is unable to function and requires assistance with activities of daily living because the depression and anxiety are so severe.  I believe she qualifies for long-term disability. She also cannot function physically because of severe shoulder pain because of frozen shoulder. The antiestrogen therapy has accentuated many of the signs and symptoms.  She would like to reapply for disability.  We will fill out that paperwork when it arrives. We will see her back in 3 months to check on her symptoms from antiestrogen therapy.

## 2019-03-23 MED FILL — ROSUVASTATIN CALCIUM 10 MG: 10 | 90 days supply | Qty: 90 | Fill #0

## 2019-03-25 ENCOUNTER — Inpatient Hospital Stay: Payer: 59 | Admitting: General Practice

## 2019-03-25 NOTE — Progress Notes (Deleted)
[facility] CSW Progress Notes  Appointment rescheduled for tomorrow at 11:30 due to CSW unavailability today.  Edwyna Shell, LCSW Clinical Social Worker Phone:  [phone]

## 2019-03-26 ENCOUNTER — Encounter: Payer: Self-pay | Admitting: General Practice

## 2019-03-26 ENCOUNTER — Inpatient Hospital Stay: Payer: 59 | Attending: Hematology and Oncology | Admitting: General Practice

## 2019-03-26 ENCOUNTER — Other Ambulatory Visit: Payer: Self-pay

## 2019-03-26 DIAGNOSIS — C50812 Malignant neoplasm of overlapping sites of left female breast: Secondary | ICD-10-CM

## 2019-03-26 DIAGNOSIS — Z9289 Personal history of other medical treatment: Secondary | ICD-10-CM

## 2019-03-26 DIAGNOSIS — Z170 Estrogen receptor positive status [ER+]: Secondary | ICD-10-CM

## 2019-03-26 MED FILL — TEMAZEPAM 30 MG CAPSULE: 30 | 30 days supply | Qty: 30 | Fill #0

## 2019-03-26 NOTE — Progress Notes (Signed)
[facility] CSW Progress Notes  Met w patient by phone to continue to work on cancer-related issues w anxiety and depression.  Find that episodes of severe depression come unexpectedly - "tears just come from nowhere", unable to focus on anything other than overwhelming feeling of sadness/pain.  Distinguishes this feeling from one of anxiety where she is troubled by feeling overwhelmed w worried thoughts.  Although these periods of anxiety and depression have lessened in frequency and length, they still occur daily and are paralyzing when happening.  Discussed differentiating between "things I can change" and "things I can't change."  Identified two major areas of distress and worked on ways to both release these feelings and take limited positive action to address what she can change about these situations.  Affirmed positive actions that she has been taking recently including discussing needs w oncologist, practicing "mindful moment" exercise 3x/day and night medication nightly  Discussed personal history of depression and treatment history.  Explored option of referral for medication management by psychiatrist, will discuss further at next visit.  Scheduled next phone session for 8/5 at 11 AM, encouraged to touch base as needed in the meantime.  Edwyna Shell, LCSW Clinical Social Worker Phone:  [phone]

## 2019-03-26 NOTE — Progress Notes (Signed)
[facility] CSW Progress Notes  Patient requests referral to [place] for visit w Dr Shea Evans for medication management.  VM left for Dr Geralyn Flash desk nurse - thiss needs to be an Epic referral.  Edwyna Shell, LCSW Clinical Social Worker Phone:  [phone]

## 2019-04-01 ENCOUNTER — Ambulatory Visit: Payer: 59 | Admitting: Orthopedic Surgery

## 2019-04-01 DIAGNOSIS — F430 Acute stress reaction: Secondary | ICD-10-CM | POA: Diagnosis not present

## 2019-04-01 DIAGNOSIS — G479 Sleep disorder, unspecified: Secondary | ICD-10-CM | POA: Diagnosis not present

## 2019-04-02 ENCOUNTER — Telehealth: Payer: Self-pay | Admitting: Hematology and Oncology

## 2019-04-02 ENCOUNTER — Telehealth: Payer: Self-pay | Admitting: *Deleted

## 2019-04-02 ENCOUNTER — Encounter: Payer: Self-pay | Admitting: General Practice

## 2019-04-02 MED FILL — buPROPion HCL ER (XL) 150 M: 150 | 90 days supply | Qty: 90 | Fill #0

## 2019-04-02 NOTE — Telephone Encounter (Signed)
Faxed medical records for office visit on 03/20/19 and the 3 with Social work dates 03/19/19 and 2 for 03/26/19 to [place] with [employer] @ [phone]

## 2019-04-02 NOTE — Telephone Encounter (Signed)
Spoke to pt regarding FMLA for both Dawn Booker. Informed pt that Dawn Booker has sent all documents and forms as well as office notes requested from both.

## 2019-04-02 NOTE — Progress Notes (Signed)
[facility] CSW Progress Notes  Email from patient, requested call from [place] today.  Confirmed that patient is scheduled to be seen at [facility] by therapist Aug 12 then Dr Shea Evans for medications management in early September.  Also has received call from [employer] that notification paperwork of return to work date has not been received.  [facility] has requested medical records from dates of service 7/16 - 03/26/2019 for appeal of disability denial.  Staff message sent to MD, nurse navigator and Thompson Caul re [employer] request.  Will talk w Medical Records re needs for medical documentation from [facility] to determine next steps.  Patient sending email to providers requesting help w these matters.  Edwyna Shell, LCSW Clinical Social Worker Phone:  [phone]

## 2019-04-08 ENCOUNTER — Other Ambulatory Visit: Payer: Self-pay | Admitting: Hematology and Oncology

## 2019-04-08 ENCOUNTER — Encounter: Payer: Self-pay | Admitting: General Practice

## 2019-04-08 ENCOUNTER — Inpatient Hospital Stay: Payer: 59 | Attending: Hematology and Oncology | Admitting: General Practice

## 2019-04-08 DIAGNOSIS — Z79811 Long term (current) use of aromatase inhibitors: Secondary | ICD-10-CM | POA: Insufficient documentation

## 2019-04-08 DIAGNOSIS — Z170 Estrogen receptor positive status [ER+]: Secondary | ICD-10-CM | POA: Insufficient documentation

## 2019-04-08 DIAGNOSIS — Z79899 Other long term (current) drug therapy: Secondary | ICD-10-CM | POA: Insufficient documentation

## 2019-04-08 DIAGNOSIS — E782 Mixed hyperlipidemia: Secondary | ICD-10-CM | POA: Insufficient documentation

## 2019-04-08 DIAGNOSIS — Z87891 Personal history of nicotine dependence: Secondary | ICD-10-CM | POA: Insufficient documentation

## 2019-04-08 DIAGNOSIS — F329 Major depressive disorder, single episode, unspecified: Secondary | ICD-10-CM | POA: Insufficient documentation

## 2019-04-08 DIAGNOSIS — Z9013 Acquired absence of bilateral breasts and nipples: Secondary | ICD-10-CM | POA: Insufficient documentation

## 2019-04-08 DIAGNOSIS — C50812 Malignant neoplasm of overlapping sites of left female breast: Secondary | ICD-10-CM | POA: Insufficient documentation

## 2019-04-08 DIAGNOSIS — Z923 Personal history of irradiation: Secondary | ICD-10-CM | POA: Insufficient documentation

## 2019-04-08 DIAGNOSIS — Z803 Family history of malignant neoplasm of breast: Secondary | ICD-10-CM | POA: Insufficient documentation

## 2019-04-08 DIAGNOSIS — F419 Anxiety disorder, unspecified: Secondary | ICD-10-CM | POA: Insufficient documentation

## 2019-04-08 MED FILL — LORazepam 0.5 MG TABS: 0.5 | 10 days supply | Qty: 30 | Fill #0

## 2019-04-08 NOTE — Progress Notes (Signed)
[facility] CSW Progress Notes  Call to patient to check in and assess progress.  Has gotten paperwork provided to appropriate entities.  Exploring options.  Mood improved, accepting things "as they are."  Has upcoming visits with providers for therapy and medications management. Has participated in outing w family members, appreciates the opportunity to connect with loved ones.  Making progress.  Encouraged to check in and provide updates as she transitions to new providers.  Emailed [place] and [employer] calendars, encouraged to take advantage of upcoming opportunities for classes and support groups.  Edwyna Shell, LCSW Clinical Social Worker Phone:  [phone]

## 2019-04-09 ENCOUNTER — Encounter: Payer: Self-pay | Admitting: General Practice

## 2019-04-09 ENCOUNTER — Ambulatory Visit (INDEPENDENT_AMBULATORY_CARE_PROVIDER_SITE_OTHER): Payer: 59 | Admitting: Orthopedic Surgery

## 2019-04-09 ENCOUNTER — Ambulatory Visit: Payer: Self-pay

## 2019-04-09 ENCOUNTER — Encounter: Payer: Self-pay | Admitting: Orthopedic Surgery

## 2019-04-09 VITALS — Ht 66.0 in | Wt 156.0 lb

## 2019-04-09 DIAGNOSIS — M79605 Pain in left leg: Secondary | ICD-10-CM

## 2019-04-09 NOTE — Progress Notes (Signed)
Office Visit Note   Patient: Dawn Booker           Date of Birth: August 17, 1956           MRN: [id number] Visit Date: 04/09/2019 Requested by: Lawerance Cruel, [place],  [place] [address] PCP: Lawerance Cruel, MD  Subjective: Chief Complaint  Patient presents with  . Left Leg - Pain    HPI: Dawn Booker is a patient with left groin pain.  She has breast cancer.  She had MRI scan on the left hip and a PET scan both in February 2020.  Left hip showed mild degenerative changes in the hip with some degenerated but untorn labrum.  PET scan also did not show any vertebral body lesions.  She is reporting left groin pain with ambulation.  She did have radiographic guided hip injection without any relief into the hip joint.  Denies any fevers or chills.  She states that this hip pain is keeping her from walking and ambulating the way she wants to.  She is having pain on a very frequent and almost daily basis.              ROS: All systems reviewed are negative as they relate to the chief complaint within the history of present illness.  Patient denies  fevers or chills.   Assessment & Plan: Visit Diagnoses:  1. Pain in left leg     Plan: Impression is left groin pain with no real radiographic findings today or at the time of the MRI scan 6 months ago.  Mild groin pain with internal/external rotation so the left hip is slightly irritable.  Mild arthritis is present but not enough for any type of procedure.  The labrum is degenerated but not torn on MRI scan and we reviewed that together with the patient.  I think the real question here is whether or not she is having hip pain from intrinsic mild arthritis in the hip or is this referred pain from the back.  Plan MRI scan lumbar spine to evaluate for left-sided radicular symptoms causing lesion whether it be a disc or something else.  I will see her back after that study.  Could consider       repeat injection into the hip  joint at that time if needed.  Repeat injection in the hip joint at that time.  Follow-Up Instructions: Return for after MRI.   Orders:  Orders Placed This Encounter  Procedures  . XR HIP UNILAT W OR W/O PELVIS 2-3 VIEWS LEFT  . XR Lumbar Spine 2-3 Views   No orders of the defined types were placed in this encounter.     Procedures: No procedures performed   Clinical Data: No additional findings.  Objective: Vital Signs: Ht 5\' 6"  (1.676 m)   Wt 156 lb (70.8 kg)   BMI 25.18 kg/m   Physical Exam:   Constitutional: Patient appears well-developed HEENT:  Head: Normocephalic Eyes:EOM are normal Neck: Normal range of motion Cardiovascular: Normal rate Pulmonary/chest: Effort normal Neurologic: Patient is alert Skin: Skin is warm Psychiatric: Patient has normal mood and affect    Ortho Exam: Ortho exam demonstrates normal gait alignment.  No Trendelenburg gait present.  Only mild internal/external rotation pain on that left leg.  Pedal pulses palpable.  No masses lymphadenopathy or skin changes noted in the back or pelvic region.  Mild pain with forward lateral bending but no trochanteric tenderness is noted.  No  paresthesias L1 S1 bilaterally.  Specialty Comments:  No specialty comments available.  Imaging: Xr Hip Unilat W Or W/o Pelvis 2-3 Views Left  Result Date: 04/09/2019 AP pelvis lateral left hip reviewed.  Mild osteitis pubis noted.  Not much in the way of significant asymmetric joint space narrowing in the hip joints.  No bony lesions noted in the pelvis or proximal femur region.    PMFS History: Patient Active Problem List   Diagnosis Date Noted  . Cancer of left female breast  ([place]) 06/13/2018  . Malignant neoplasm of overlapping sites of left breast in female, estrogen receptor positive ([place]) 05/09/2018  . Blurring of visual image of both eyes 12/06/2017  . Postherpetic neuralgia 12/06/2017  . BIPOLAR DISORDER UNSPECIFIED 10/10/2009  . ESOPHAGEAL  REFLUX 10/10/2009   Past Medical History:  Diagnosis Date  . ADD (attention deficit disorder)   . Anxiety   . Cancer [facility])    breast cancer  . Depression   . GERD (gastroesophageal reflux disease)   . Hiatal hernia   . History of radiation therapy 08/13/2018- 09/25/2018   Left Chest wall and IM nodes/ 50 Gy in 25 fractions, Left supraclaicular fossa and PAB/ 50 Gy in 25 fractions, Left chest wall scar boost/ 10 Gy in 5 fractions.   . Hyperlipemia, mixed   . Intermittent palpitations   . Malignant neoplasm of overlapping sites of left breast in female, estrogen receptor positive ([place]) 05/06/2018   oncologist-  dr Lindi Adie--  Invasive Lobular Cancer, CIS, Stage IB, Grade 2 (pT3,pN1a,cM0), ER+---  s/p  left mastectomy with sln dissections and right mastectomy (benign)  . PONV (postoperative nausea and vomiting)   . Right thyroid nodule   . Shingles 12/2017    Family History  Problem Relation Age of Onset  . Hypertension Mother   . Heart attack Mother   . Breast cancer Mother   . Bipolar disorder Father   . Breast cancer Maternal Grandmother   . Breast cancer Cousin     Past Surgical History:  Procedure Laterality Date  . AXILLARY LYMPH NODE DISSECTION Left 07/09/2018   Procedure: COMPLETION OF LEFT AXILLARY LYMPH NODE DISSECTION;  Surgeon: Jovita Kussmaul, MD;  Location: [facility];  Service: General;  Laterality: Left;  . BREAST EXCISIONAL BIOPSY Left 1995   benign  . CARDIOVASCULAR STRESS TEST  06/21/2017   normal nuclear stress study w/ no ischemia/  normal LV function and wall function, nuclear stress ef 70%  . ELBOW SURGERY Left child  . MASTECTOMY W/ SENTINEL NODE BIOPSY Left 06/13/2018  . MASTECTOMY WITH RADIOACTIVE SEED GUIDED EXCISION AND AXILLARY SENTINEL LYMPH NODE BIOPSY Bilateral 06/13/2018   Procedure: LEFT MASTECTOMY WITH SENTINEL LYMPH NODE MAPPING AND TARGETED NODE DISSECTION AND RIGHT PROPHYLATIC MASTECTOMY;  Surgeon: Jovita Kussmaul, MD;  Location: [facility];  Service:  General;  Laterality: Bilateral;  . THYROIDECTOMY Left 10-31-2001   dr gerkin @[facility]   . TRANSTHORACIC ECHOCARDIOGRAM  06/21/2017   ef 60-65%/  trivial MR (no evidence mvp)  . TUBAL LIGATION  yrs ago  . VAGINAL HYSTERECTOMY  1990s   Social History   Occupational History  . Not on file  Tobacco Use  . Smoking status: Former Smoker    Years: 0.00    Types: Cigarettes    Quit date: 12/05/2017    Years since quitting: 1.3  . Smokeless tobacco: Never Used  Substance and Sexual Activity  . Alcohol use: Not Currently  . Drug use: No  . Sexual activity: Not  on file

## 2019-04-09 NOTE — Progress Notes (Signed)
[facility] CSW Progress Notes  Call from [place] [phone]), [employer] claim rep.  Wants to know if undersigned has received fax re this client (claim [phone]).  Left VM for claim rep, have not received a fax from [facility], however stated that oncologist may have and provided number for Dr Landis Gandy desk RN.    Edwyna Shell, LCSW Clinical Social Worker Phone:  [phone]

## 2019-04-10 ENCOUNTER — Encounter: Payer: Self-pay | Admitting: General Practice

## 2019-04-10 NOTE — Progress Notes (Signed)
[facility] CSW Progress Notes  Informed patient that undersigned had left tow VMs for [facility] rep as well as notified nurse navigator/desk RN and Thompson Caul of request for faxed information.  Desk nurse and Cecille Rubin are not aware of receipt of any fax from [place], staff message sent to [employer].  Awaiting return call from [facility] rep.  Edwyna Shell, LCSW Clinical Social Worker Phone:  [phone]

## 2019-04-13 ENCOUNTER — Encounter: Payer: 59 | Admitting: Adult Health

## 2019-04-13 ENCOUNTER — Other Ambulatory Visit: Payer: 59

## 2019-04-13 ENCOUNTER — Other Ambulatory Visit: Payer: Self-pay | Admitting: *Deleted

## 2019-04-13 ENCOUNTER — Other Ambulatory Visit: Payer: Self-pay | Admitting: Hematology and Oncology

## 2019-04-13 MED ORDER — TAMOXIFEN CITRATE 20 MG PO TABS: 20.0000 mg | ORAL_TABLET | Freq: Every day | ORAL | 0 refills | Status: DC

## 2019-04-13 MED ORDER — LORAZEPAM 0.5 MG PO TABS: 0.5000 mg | ORAL_TABLET | Freq: Three times a day (TID) | ORAL | 1 refills | Status: DC | PRN

## 2019-04-13 MED FILL — TAMOXIFEN CITRATE 20 MG TAB: 20 | 90 days supply | Qty: 90 | Fill #0

## 2019-04-15 ENCOUNTER — Ambulatory Visit (HOSPITAL_COMMUNITY): Payer: Self-pay | Admitting: Licensed Clinical Social Worker

## 2019-04-17 ENCOUNTER — Telehealth: Payer: Self-pay | Admitting: *Deleted

## 2019-04-17 NOTE — Telephone Encounter (Signed)
Received call from pt checking on status of MRI order, I checked in the referrals workque and there is no order for MRI lumbar spine without contrast placed. I called pt left vm stating this and that I will go ahead and place the order since Dr. Marlou Sa had mentioned in his ov notes he was going to order and that I will send it as urgent so she can be seen quicker. I also mentioned that [facility] even as urgent is scheduling out to mid Sept and I will see about sending her to [facility] dept so she can be seen quicker and if she was ok with this to give me a call and I will send it to [employer].

## 2019-04-18 IMAGING — CT NM PET TUM IMG INITIAL (PI) SKULL BASE T - THIGH
1 of 8 series · 1 of 25 positions shown · non-contrast
Comparison: Lumbar MRI 10/09/2018

CLINICAL DATA: Initial treatment strategy for lumbar spine lesions
suspicious for metastatic disease. History of LEFT breast cancer..

EXAM:
NUCLEAR MEDICINE PET SKULL BASE TO THIGH
TECHNIQUE: 7.6 mCi F-18 FDG was injected intravenously. Full-ring PET imaging
was performed from the skull base to thigh after the radiotracer. CT
data was obtained and used for attenuation correction and anatomic
localization.
Fasting blood glucose: 106 mg/dl

[Series 4: ct sk_thigh 5.0 b31f · axial · 5.0mm · 0.98mm/px · 1 of 232 slices shown]
[im 232/232  brain]
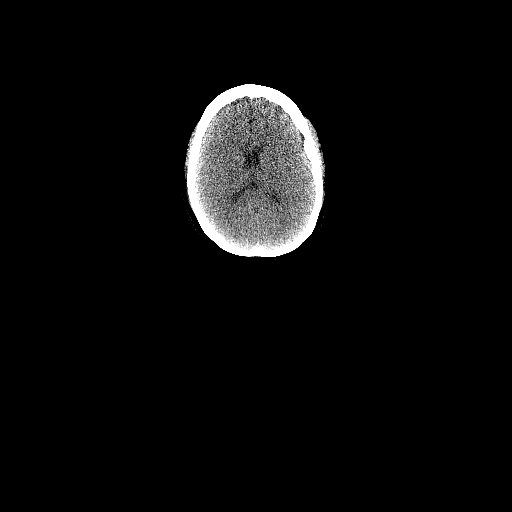

[1 of 25 positions shown; findings below may reference images not displayed]

FINDINGS: Mediastinal blood pool activity: SUV max 29

NECK: No hypermetabolic lymph nodes in the neck.

Incidental CT findings: none

CHEST: No hypermetabolic mediastinal or hilar nodes. No suspicious
pulmonary nodules on the CT scan.

Incidental CT findings: Bilateral mastectomy anatomy

ABDOMEN/PELVIS: No abnormal hypermetabolic activity within the
liver, pancreas, adrenal glands, or spleen. No hypermetabolic lymph
nodes in the abdomen or pelvis.

Incidental CT findings: Post hysterectomy.  Benign hepatic cysts.

SKELETON: There is no abnormal radiotracer within the L4-L5
vertebral body to correspond to the MRI findings. No discrete lesion
on the CT portion exam.

Incidental CT findings: none
IMPRESSION: 1. No hypermetabolism within the L4-L5 vertebral bodies to
correspond to MRI findings. This supports benign etiology. Recommend
follow-up MRI with and without contrast in 6-9 months as recommended
on MRI 10/09/2018.
[DATE]. No evidence of recurrence or metastatic disease elsewhere on
skull base to thigh FDG PET scan.

## 2019-04-24 ENCOUNTER — Other Ambulatory Visit: Payer: Self-pay | Admitting: Orthopaedic Surgery

## 2019-04-24 ENCOUNTER — Other Ambulatory Visit: Payer: Self-pay | Admitting: Orthopedic Surgery

## 2019-04-24 DIAGNOSIS — M545 Low back pain, unspecified: Secondary | ICD-10-CM

## 2019-04-27 MED FILL — TEMAZEPAM 30 MG CAPSULE: 30 | 30 days supply | Qty: 30 | Fill #0

## 2019-04-29 ENCOUNTER — Encounter: Payer: Self-pay | Admitting: Adult Health

## 2019-04-29 ENCOUNTER — Telehealth: Payer: Self-pay | Admitting: Hematology and Oncology

## 2019-04-29 ENCOUNTER — Inpatient Hospital Stay (HOSPITAL_BASED_OUTPATIENT_CLINIC_OR_DEPARTMENT_OTHER): Payer: 59 | Admitting: Adult Health

## 2019-04-29 ENCOUNTER — Other Ambulatory Visit: Payer: Self-pay

## 2019-04-29 ENCOUNTER — Inpatient Hospital Stay: Payer: 59

## 2019-04-29 VITALS — BP 144/96 | HR 88 | Temp 98.3°F | Resp 17 | Ht 66.0 in | Wt 157.7 lb

## 2019-04-29 DIAGNOSIS — C50812 Malignant neoplasm of overlapping sites of left female breast: Secondary | ICD-10-CM

## 2019-04-29 DIAGNOSIS — Z9013 Acquired absence of bilateral breasts and nipples: Secondary | ICD-10-CM | POA: Diagnosis not present

## 2019-04-29 DIAGNOSIS — Z923 Personal history of irradiation: Secondary | ICD-10-CM | POA: Diagnosis not present

## 2019-04-29 DIAGNOSIS — Z0000 Encounter for general adult medical examination without abnormal findings: Secondary | ICD-10-CM

## 2019-04-29 DIAGNOSIS — Z79899 Other long term (current) drug therapy: Secondary | ICD-10-CM | POA: Diagnosis not present

## 2019-04-29 DIAGNOSIS — F419 Anxiety disorder, unspecified: Secondary | ICD-10-CM | POA: Diagnosis not present

## 2019-04-29 DIAGNOSIS — M545 Low back pain: Secondary | ICD-10-CM | POA: Diagnosis not present

## 2019-04-29 DIAGNOSIS — F329 Major depressive disorder, single episode, unspecified: Secondary | ICD-10-CM | POA: Diagnosis not present

## 2019-04-29 DIAGNOSIS — Z170 Estrogen receptor positive status [ER+]: Secondary | ICD-10-CM

## 2019-04-29 DIAGNOSIS — E782 Mixed hyperlipidemia: Secondary | ICD-10-CM | POA: Diagnosis not present

## 2019-04-29 DIAGNOSIS — Z87891 Personal history of nicotine dependence: Secondary | ICD-10-CM | POA: Diagnosis not present

## 2019-04-29 DIAGNOSIS — Z79811 Long term (current) use of aromatase inhibitors: Secondary | ICD-10-CM | POA: Diagnosis not present

## 2019-04-29 DIAGNOSIS — Z803 Family history of malignant neoplasm of breast: Secondary | ICD-10-CM | POA: Diagnosis not present

## 2019-04-29 LAB — LIPID PANEL
Cholesterol: 155 mg/dL (ref 0–200)
HDL: 64 mg/dL (ref >40)
LDL Cholesterol: 76 mg/dL (ref 0–99)
Total CHOL/HDL Ratio: 2.4 RATIO
Triglycerides: 76 mg/dL (ref <150)
VLDL: 15 mg/dL (ref 0–40)

## 2019-04-29 LAB — CMP (CANCER CENTER ONLY)
ALT: 29 U/L (ref 0–44)
AST: 19 U/L (ref 15–41)
Albumin: 4.2 g/dL (ref 3.5–5.0)
Alkaline Phosphatase: 47 U/L (ref 38–126)
Anion gap: 9 (ref 5–15)
BUN: 20 mg/dL (ref 8–23)
CO2: 25 mmol/L (ref 22–32)
Calcium: 8.6 mg/dL — ABNORMAL LOW (ref 8.9–10.3)
Chloride: 107 mmol/L (ref 98–111)
Creatinine: 0.82 mg/dL (ref 0.44–1.00)
GFR, Est AFR Am: >60 mL/min (ref >60)
GFR, Estimated: >60 mL/min (ref >60)
Glucose, Bld: 108 mg/dL — ABNORMAL HIGH (ref 70–99)
Potassium: 4 mmol/L (ref 3.5–5.1)
Sodium: 141 mmol/L (ref 135–145)
Total Bilirubin: 0.5 mg/dL (ref 0.3–1.2)
Total Protein: 6.8 g/dL (ref 6.5–8.1)

## 2019-04-29 NOTE — Progress Notes (Signed)
CLINIC:  Survivorship   REASON FOR VISIT:  Routine follow-up post-treatment for a recent history of breast cancer.  BRIEF ONCOLOGIC HISTORY:  Oncology History  Malignant neoplasm of overlapping sites of left breast in female, estrogen receptor positive ([place])  07/2015 Miscellaneous   Genetics negative.  Genes tested: APC, ATM, BARD1, BMPR1A, BRCA1, BRCA2, BRIP1, CDH1, CDK4, CDKN2A, CHEK2, EPCAM, GREM1, MLH1, MSH2, MSH6, MUTYH, PALB2, PMS2, POLD1, POLE, PTEN, RAD51C, RAD51D, RET, SMAD4, STK11, and TP53.      05/06/2018 Initial Diagnosis   Screening detected left breast mass anteriorly 1030 to 11 o'clock position 2 masses 6 mm and 7 mm, 12:30 position 2.6 cm both of these masses biopsy-proven grade 2 invasive lobular cancer ER 100%, PR 10%, Ki-67 15%, HER-2 negative 1+ by IHC, lymph node positive for malignancy T2N1 stage IIa clinical stage   05/21/2018 Cancer Staging   Staging form: Breast, AJCC 8th Edition - Pathologic: Stage IB (pT3, pN1a, cM0, G2, ER+, PR+, HER2-) - Signed by Nicholas Lose, MD on 06/24/2018   06/13/2018 Surgery   Bilateral mastectomies: Left: ILC, grade 2, 5.4 cm, LCIS, margins negative, lymphovascular invasion present, 2/7 lymph nodes positive with extranodal extension (1 additional lymph node had isolated tumor cells), right mastectomy benign; T3N1a ER 100%, PR 10%, HER-2 -1+, Ki-67 10 to 15%, stage Ib   08/06/2018 - 09/25/2018 Radiation Therapy   Adjuvant XRT   10/09/2018 -  Anti-estrogen oral therapy   Anastrozole 14m daily, plan for 7 years; switched to tamoxifen 11/07/18 for bone health     INTERVAL HISTORY:  [facility] to review her survivorship care plan detailing her treatment course for breast cancer, as well as monitoring long-term side effects of that treatment, education regarding health maintenance, screening, and overall wellness and health promotion.     Overall, Ms. Eveleth has had  increased difficulty in transitioning to life after cancer.  She is increasingly anxious and depressed.  She is seeing a counselor regularly and is getting in with psychiatry to work on improved medication management, because she feels the Wellbutrin isn't working.  She is tearful today.    She notes that she has had increased difficulty tolerating Anastrozole or Tamoxifen.  They both have caused increased anxiety.  She notes she is working on diet and exercise to help reduce her risk of recurrence, as she notes that this is the thing that she can control that doesn't make her feel poorly.     REVIEW OF SYSTEMS:  Review of Systems  Constitutional: Negative for appetite change, chills, fatigue and unexpected weight change.  HENT:   Negative for hearing loss, lump/mass and trouble swallowing.   Eyes: Negative for eye problems and icterus.  Respiratory: Negative for chest tightness, cough and shortness of breath.   Cardiovascular: Negative for chest pain, leg swelling and palpitations.  Gastrointestinal: Negative for abdominal distention, abdominal pain, constipation, diarrhea, nausea and vomiting.  Endocrine: Negative for hot flashes.  Genitourinary: Negative for difficulty urinating.   Musculoskeletal: Negative for arthralgias.  Skin: Negative for itching and rash.  Neurological: Negative for dizziness, extremity weakness, headaches and numbness.  Hematological: Negative for adenopathy. Does not bruise/bleed easily.  Psychiatric/Behavioral: Positive for depression. Negative for suicidal ideas. The patient is nervous/anxious.   Breast: Denies any new nodularity, masses, tenderness, nipple changes, or nipple discharge.      ONCOLOGY TREATMENT TEAM:  1. Surgeon:  Dr. [facility]arlou St[facility]at [facility] 2. Medical Oncologist: Dr.  Gudena  3. Radiation Oncologist: Dr. Isidore Moos    PAST MEDICAL/SURGICAL HISTORY:  Past Medical History:  Diagnosis Date   ADD (attention deficit disorder)     Anxiety    Cancer ([place])    breast cancer   Depression    GERD (gastroesophageal reflux disease)    Hiatal hernia    History of radiation therapy 08/13/2018- 09/25/2018   Left Chest wall and IM nodes/ 50 Gy in 25 fractions, Left supraclaicular fossa and PAB/ 50 Gy in 25 fractions, Left chest wall scar boost/ 10 Gy in 5 fractions.    Hyperlipemia, mixed    Intermittent palpitations    Malignant neoplasm of overlapping sites of left breast in female, estrogen receptor positive ([place]) 05/06/2018   oncologist-  dr Lindi Adie--  Invasive Lobular Cancer, CIS, Stage IB, Grade 2 (pT3,pN1a,cM0), ER+---  s/p  left mastectomy with sln dissections and right mastectomy (benign)   PONV (postoperative nausea and vomiting)    Right thyroid nodule    Shingles 12/2017   Past Surgical History:  Procedure Laterality Date   AXILLARY LYMPH NODE DISSECTION Left 07/09/2018   Procedure: COMPLETION OF LEFT AXILLARY LYMPH NODE DISSECTION;  Surgeon: [name], MD;  Location: [facility];  Service: General;  Laterality: Left;   BREAST EXCISIONAL BIOPSY Left 1995   benign   CARDIOVASCULAR STRESS TEST  06/21/2017   normal nuclear stress study w/ no ischemia/  normal LV function and wall function, nuclear stress ef 70%   ELBOW SURGERY Left child   MASTECTOMY W/ SENTINEL NODE BIOPSY Left 06/13/2018   MASTECTOMY WITH RADIOACTIVE SEED GUIDED EXCISION AND AXILLARY SENTINEL LYMPH NODE BIOPSY Bilateral 06/13/2018   Procedure: LEFT MASTECTOMY WITH SENTINEL LYMPH NODE MAPPING AND TARGETED NODE DISSECTION AND RIGHT PROPHYLATIC MASTECTOMY;  Surgeon: Jovita Kussmaul, MD;  Location: [place];  Service: General;  Laterality: Bilateral;   THYROIDECTOMY Left 10-31-2001   dr gerkin _0    TRANSTHORACIC ECHOCARDIOGRAM  06/21/2017   ef 60-65%/  trivial MR (no evidence mvp)   TUBAL LIGATION  yrs ago   VAGINAL HYSTERECTOMY  1990s     ALLERGIES:  Allergies  Allergen Reactions   Lamictal [Lamotrigine] Other (See  Comments)    Katherina Right Syndrome   Hydrocodone Nausea Only   Mobic [Meloxicam]     Same ingredients as lamictal   Sulfa Antibiotics Hives   Brintellix [Vortioxetine] Anxiety   Corticosteroids Rash   Morphine Palpitations and Other (See Comments)   Other     "MYCINS" - severe abdominal cramping    Prednisone Rash        Zoloft [Sertraline] Anxiety     CURRENT MEDICATIONS:  Outpatient Encounter Medications as of 04/29/2019  Medication Sig Note   acetaminophen (TYLENOL) 500 MG tablet Take 1,000 mg by mouth at bedtime.     buPROPion (WELLBUTRIN XL) 300 MG 24 hr tablet Take 150 mg by mouth daily.     Calcium Carbonate-Vitamin D (CALCIUM 500/D PO) Take 500 mg by mouth.    diphenhydrAMINE (BENADRYL) 25 mg capsule Take 25 mg by mouth at bedtime.    LORazepam (ATIVAN) 0.5 MG tablet Take 1 tablet (0.5 mg total) by mouth every 8 (eight) hours as needed. for anxiety    Magnesium 400 MG TABS Take 400 mg by mouth.    omeprazole (PRILOSEC) 40 MG capsule Take 40 mg by mouth daily.    temazepam (RESTORIL) 30 MG capsule Take 30 mg by mouth at bedtime.     Cholecalciferol (VITAMIN D) 2000 units  CAPS Take 2,000 Units by mouth daily.     folic acid (FOLVITE) 003 MCG tablet Take 400 mcg by mouth daily.    rosuvastatin (CRESTOR) 10 MG tablet Take 1 tablet (10 mg total) by mouth daily. 07/24/2018: Pt reports this has been renewed and is currently taking   tamoxifen (NOLVADEX) 20 MG tablet Take 1 tablet (20 mg total) by mouth daily. (Patient not taking: Reported on 04/29/2019)    [DISCONTINUED] gabapentin (NEURONTIN) 300 MG capsule 2 capsules 3 times daily, 3 capsules at night (Patient taking differently: Take 600 mg by mouth every evening. 600 mg in the evening)    [DISCONTINUED] hydrOXYzine (VISTARIL) 25 MG capsule Take 25 mg by mouth as needed for anxiety.    [DISCONTINUED] promethazine (PHENERGAN) 25 MG tablet Take 25 mg by mouth every 6 (six) hours as needed. for nausea     No facility-administered encounter medications on file as of 04/29/2019.      ONCOLOGIC FAMILY HISTORY:  Family History  Problem Relation Age of Onset   Hypertension Mother    Heart attack Mother    Breast cancer Mother    Bipolar disorder Father    Breast cancer Maternal Grandmother    Breast cancer Cousin      GENETIC COUNSELING/TESTING: See above  SOCIAL HISTORY:  Social History   Socioeconomic History   Marital status: Married    Spouse name: Not on file   Number of children: 2   Years of education: 12   Highest education level: High school graduate  Occupational History   Not on file  Social Needs   Financial resource strain: Not on file   Food insecurity    Worry: Not on file    Inability: Not on file   Transportation needs    Medical: No    Non-medical: No  Tobacco Use   Smoking status: Former Smoker    Years: 0.00    Types: Cigarettes    Quit date: 12/05/2017    Years since quitting: 1.4   Smokeless tobacco: Never Used  Substance and Sexual Activity   Alcohol use: Not Currently   Drug use: No   Sexual activity: Not on file  Lifestyle   Physical activity    Days per week: Not on file    Minutes per session: Not on file   Stress: Not on file  Relationships   Social connections    Talks on phone: Not on file    Gets together: Not on file    Attends religious service: Not on file    Active member of club or organization: Not on file    Attends meetings of clubs or organizations: Not on file    Relationship status: Not on file   Intimate partner violence    Fear of current or ex partner: No    Emotionally abused: No    Physically abused: No    Forced sexual activity: No  Other Topics Concern   Not on file  Social History Narrative   Lives at home with husband.   Right-handed.   1 cup coffee per day, occasional soda or tea.       PHYSICAL EXAMINATION:  Vital Signs:   Vitals:   04/29/19 1001  BP: (!) 144/96   Pulse: 88  Resp: 17  Temp: 98.3 F (36.8 C)  SpO2: 100%   Filed Weights   04/29/19 1001  Weight: 157 lb 11.2 oz (71.5 kg)   General: Well-nourished, well-appearing female in no acute  distress.  She is unaccompanied today.   HEENT: Head is normocephalic.  Pupils equal and reactive to light. Conjunctivae clear without exudate.  Sclerae anicteric. Oral mucosa is pink, moist.  Oropharynx is pink without lesions or erythema.  Lymph: No cervical, supraclavicular, or infraclavicular lymphadenopathy noted on palpation.  Cardiovascular: Regular rate and rhythm.Marland Kitchen Respiratory: Clear to auscultation bilaterally. Chest expansion symmetric; breathing non-labored.  Breasts: s/p bilateral mastectomies, no sign of local recurrence.   GI: Abdomen soft and round; non-tender, non-distended. Bowel sounds normoactive.  GU: Deferred.  Neuro: No focal deficits. Steady gait.  Psych: Mood and affect normal and appropriate for situation.  Extremities: No edema. MSK: No focal spinal tenderness to palpation.  Full range of motion in bilateral upper extremities Skin: Warm and dry.  LABORATORY DATA:  None for this visit.  DIAGNOSTIC IMAGING:  None for this visit.      ASSESSMENT AND PLAN:  Ms.. Zabawa is a pleasant 63 y.o. female with Stage IB left breast invasive ductal carcinoma, ER+/PR+/HER2-, diagnosed in 05/2018, treated with lumpectomy, adjuvant radiation therapy, and hasn't been able to tolerate anti estrogen therapy.  She presents to the Survivorship Clinic for our initial meeting and routine follow-up post-completion of treatment for breast cancer.    1. Stage IB left breast cancer:  Ms. Wooton is continuing to recover from definitive treatment for breast cancer. She will follow-up with her medical oncologist, Dr. Lindi Adie in 3 months with history and physical exam per surveillance protocol.  She and I reviewed the risk involved with not taking anti estrogen therapy.  We discussed her possibly trying  Exemestane.  She is going to think about it and call us back if she is interested.  Today, a comprehensive survivorship care plan and treatment summary was reviewed with the patient today detailing her breast cancer diagnosis, treatment course, potential late/long-term effects of treatment, appropriate follow-up care with recommendations for the future, and patient education resources.  A copy of this summary, along with a letter will be sent to the patients primary care provider via mail/fax/In Basket message after todays visit.    2. Severe Anxiety and Depression:  Seeing counseling and psychiatry.  My hope is that once this is under improved control, she will be able to successfully take anti estrogen therapy.  3. Bone health:  Given Ms. Kohl's age/history, she is at risk for bone demineralization.  Her last DEXA scan was on 04/25/2018, which showed osteoporosis with a T score of -3.1 in the left femur. She was encouraged to increase her consumption of foods rich in calcium, as well as increase her weight-bearing activities.  She was given education on specific activities to promote bone health.  4. Cancer screening:  Due to Ms. Faughn's history and her age, she should receive screening for skin cancers, colon cancer, and gynecologic cancers.  The information and recommendations are listed on the patient's comprehensive care plan/treatment summary and were reviewed in detail with the patient.    5. Health maintenance and wellness promotion: Ms. Dena was encouraged to consume 5-7 servings of fruits and vegetables per day. We reviewed the "Nutrition Rainbow" handout, as well as the handout "Take Control of Your Health and Reduce Your Cancer Risk" from the [place].  She was also encouraged to engage in moderate to vigorous exercise for 30 minutes per day most days of the week. We discussed the [employer] fitness program, which is designed for cancer survivors to help them become  more physically fit after  cancer treatments.  She was instructed to limit her alcohol consumption and continue to abstain from tobacco use.     6. Support services/counseling: It is not uncommon for this period of the patient's cancer care trajectory to be one of many emotions and stressors.  We discussed an opportunity for her to participate in the next session of [facility] ("Finding Your New Normal") support group series designed for patients after they have completed treatment.   Ms. Quadros was encouraged to take advantage of our many other support services programs, support groups, and/or counseling in coping with her new life as a cancer survivor after completing anti-cancer treatment.  She was offered support today through active listening and expressive supportive counseling.  She was given information regarding our available services and encouraged to contact me with any questions or for help enrolling in any of our support group/programs.    Dispo:   -Return to cancer center in 3 months for f/u with Dr. Lindi Adie -She is welcome to return back to the Survivorship Clinic at any time; no additional follow-up needed at this time.  -Consider referral back to survivorship as a long-term survivor for continued surveillance  A total of (30) minutes of face-to-face time was spent with this patient with greater than 50% of that time in counseling and care-coordination.   Gardenia Phlegm, NP Survivorship Program [place] [phone]   Note: PRIMARY CARE PROVIDER Lawerance Cruel, [place] [phone]

## 2019-04-29 NOTE — Telephone Encounter (Signed)
I talk with patient regarding 12/1

## 2019-04-29 NOTE — Patient Instructions (Signed)
Bone Health Bones protect organs, store calcium, anchor muscles, and support the whole body. Keeping your bones strong is important, especially as you get older. You can take actions to help keep your bones strong and healthy. Why is keeping my bones healthy important?  Keeping your bones healthy is important because your body constantly replaces bone cells. Cells get old, and new cells take their place. As we age, we lose bone cells because the body may not be able to make enough new cells to replace the old cells. The amount of bone cells and bone tissue you have is referred to as bone mass. The higher your bone mass, the stronger your bones. The aging process leads to an overall loss of bone mass in the body, which can increase the likelihood of:  Joint pain and stiffness.  Broken bones.  A condition in which the bones become weak and brittle (osteoporosis). A large decline in bone mass occurs in older adults. In women, it occurs about the time of menopause. What actions can I take to keep my bones healthy? Good health habits are important for maintaining healthy bones. This includes eating nutritious foods and exercising regularly. To have healthy bones, you need to get enough of the right minerals and vitamins. Most nutrition experts recommend getting these nutrients from the foods that you eat. In some cases, taking supplements may also be recommended. Doing certain types of exercise is also important for bone health. What are the nutritional recommendations for healthy bones?  Eating a well-balanced diet with plenty of calcium and vitamin D will help to protect your bones. Nutritional recommendations vary from person to person. Ask your health care provider what is healthy for you. Here are some general guidelines. Get enough calcium Calcium is the most important (essential) mineral for bone health. Most people can get enough calcium from their diet, but supplements may be recommended for  people who are at risk for osteoporosis. Good sources of calcium include:  Dairy products, such as low-fat or nonfat milk, cheese, and yogurt.  Dark green leafy vegetables, such as bok choy and broccoli.  Calcium-fortified foods, such as orange juice, cereal, bread, soy beverages, and tofu products.  Nuts, such as almonds. Follow these recommended amounts for daily calcium intake:  Children, age 1-3: 700 mg.  Children, age 4-8: 1,000 mg.  Children, age 9-13: 1,300 mg.  Teens, age 14-18: 1,300 mg.  Adults, age 19-50: 1,000 mg.  Adults, age 51-70: ? Men: 1,000 mg. ? Women: 1,200 mg.  Adults, age 71 or older: 1,200 mg.  Pregnant and breastfeeding females: ? Teens: 1,300 mg. ? Adults: 1,000 mg. Get enough vitamin D Vitamin D is the most essential vitamin for bone health. It helps the body absorb calcium. Sunlight stimulates the skin to make vitamin D, so be sure to get enough sunlight. If you live in a cold climate or you do not get outside often, your health care provider may recommend that you take vitamin D supplements. Good sources of vitamin D in your diet include:  Egg yolks.  Saltwater fish.  Milk and cereal fortified with vitamin D. Follow these recommended amounts for daily vitamin D intake:  Children and teens, age 1-18: 600 international units.  Adults, age 50 or younger: 400-800 international units.  Adults, age 51 or older: 800-1,000 international units. Get other important nutrients Other nutrients that are important for bone health include:  Phosphorus. This mineral is found in meat, poultry, dairy foods, nuts, and legumes. The   recommended daily intake for adult men and adult women is 700 mg.  Magnesium. This mineral is found in seeds, nuts, dark green vegetables, and legumes. The recommended daily intake for adult men is 400-420 mg. For adult women, it is 310-320 mg.  Vitamin K. This vitamin is found in green leafy vegetables. The recommended daily  intake is 120 mg for adult men and 90 mg for adult women. What type of physical activity is best for building and maintaining healthy bones? Weight-bearing and strength-building activities are important for building and maintaining healthy bones. Weight-bearing activities cause muscles and bones to work against gravity. Strength-building activities increase the strength of the muscles that support bones. Weight-bearing and muscle-building activities include:  Walking and hiking.  Jogging and running.  Dancing.  Gym exercises.  Lifting weights.  Tennis and racquetball.  Climbing stairs.  Aerobics. Adults should get at least 30 minutes of moderate physical activity on most days. Children should get at least 60 minutes of moderate physical activity on most days. Ask your health care provider what type of exercise is best for you. How can I find out if my bone mass is low? Bone mass can be measured with an X-ray test called a bone mineral density (BMD) test. This test is recommended for all women who are age 88 or older. It may also be recommended for:  Men who are age 82 or older.  People who are at risk for osteoporosis because of: ? Having bones that break easily. ? Having a long-term disease that weakens bones, such as kidney disease or rheumatoid arthritis. ? Having menopause earlier than normal. ? Taking medicine that weakens bones, such as steroids, thyroid hormones, or hormone treatment for breast cancer or prostate cancer. ? Smoking. ? Drinking three or more alcoholic drinks a day. If you find that you have a low bone mass, you may be able to prevent osteoporosis or further bone loss by changing your diet and lifestyle. Where can I find more information? For more information, check out the following websites:  [place]: AviationTales.fr  [employer]: www.bones.SouthExposed.es  International Osteoporosis Foundation:  Administrator.iofbonehealth.org Summary  The aging process leads to an overall loss of bone mass in the body, which can increase the likelihood of broken bones and osteoporosis.  Eating a well-balanced diet with plenty of calcium and vitamin D will help to protect your bones.  Weight-bearing and strength-building activities are also important for building and maintaining strong bones.  Bone mass can be measured with an X-ray test called a bone mineral density (BMD) test. This information is not intended to replace advice given to you by your health care provider. Make sure you discuss any questions you have with your health care provider. Document Released: 11/10/2003 Document Revised: 09/16/2017 Document Reviewed: 09/16/2017 Elsevier Patient Education  2020 [employer]. Exemestane tablets What is this medicine? EXEMESTANE (ex e MES tane) blocks the production of the hormone estrogen. Some types of breast cancer depend on estrogen to grow, and this medicine can stop tumor growth by blocking estrogen production. This medicine is for the treatment of breast cancer in postmenopausal women only. This medicine may be used for other purposes; ask your health care provider or pharmacist if you have questions. COMMON BRAND NAME(S): Aromasin What should I tell my health care provider before I take this medicine? They need to know if you have any of these conditions:  an unusual or allergic reaction to exemestane, other medicines, foods, dyes, or  preservatives  pregnant or trying to get pregnant  breast-feeding How should I use this medicine? Take this medicine by mouth with a glass of water. Follow the directions on the prescription label. Take your doses at regular intervals after a meal. Do not take your medicine more often than directed. Do not stop taking except on the advice of your doctor or health care professional. Contact your pediatrician regarding the use of this medicine in children. Special  care may be needed. Overdosage: If you think you have taken too much of this medicine contact a poison control center or emergency room at once. NOTE: This medicine is only for you. Do not share this medicine with others. What if I miss a dose? If you miss a dose, take the next dose as usual. Do not try to make up the missed dose. Do not take double or extra doses. What may interact with this medicine?  certain medicines for seizures like carbamazepine, phenobarbital, phenytoin  rifampin  St. John's Wort This list may not describe all possible interactions. Give your health care provider a list of all the medicines, herbs, non-prescription drugs, or dietary supplements you use. Also tell them if you smoke, drink alcohol, or use illegal drugs. Some items may interact with your medicine. What should I watch for while using this medicine? Visit your doctor or health care professional for regular checks on your progress. If you experience hot flashes or sweating while taking this medicine, avoid alcohol, smoking and drinks with caffeine. This may help to decrease these side effects. Do not become pregnant while taking this medicine or for 1 month after stopping it. Women should inform their doctor if they wish to become pregnant or think they might be pregnant. Women of child-bearing potential will need to have a negative pregnancy test before starting this medicine. There is a potential for serious side effects to an unborn child. Do not breast-feed an infant while taking this medicine or for 1 month after stopping it. Talk to your health care professional or pharmacist for more information. What side effects may I notice from receiving this medicine? Side effects that you should report to your doctor or health care professional as soon as possible:  any new or unusual symptoms  changes in vision  fever  leg or arm swelling  pain in bones, joints, or muscles  pain in hips, back, ribs,  arms, shoulders, or legs Side effects that usually do not require medical attention (report to your doctor or health care professional if they continue or are bothersome):  difficulty sleeping  headache  hot flashes  sweating  unusually weak or tired This list may not describe all possible side effects. Call your doctor for medical advice about side effects. You may report side effects to FDA at 1-800-FDA-1088. Where should I keep my medicine? Keep out of the reach of children. Store at room temperature between 15 and 30 degrees C (59 and 86 degrees F). Throw away any unused medicine after the expiration date. NOTE: This sheet is a summary. It may not cover all possible information. If you have questions about this medicine, talk to your doctor, pharmacist, or health care provider.  2020 Elsevier/Gold Standard (2017-02-06 08:39:27)

## 2019-04-30 LAB — VITAMIN D 25 HYDROXY (VIT D DEFICIENCY, FRACTURES): Vit D, 25-Hydroxy: 39.2 ng/mL (ref 30.0–100.0)

## 2019-05-01 ENCOUNTER — Telehealth: Payer: Self-pay | Admitting: Orthopedic Surgery

## 2019-05-01 DIAGNOSIS — M79605 Pain in left leg: Secondary | ICD-10-CM

## 2019-05-01 NOTE — Telephone Encounter (Signed)
IC s/w patient and advised of report. She is asking for you to call her to discuss this further on Monday when you return to clinic. I have printed report and will have available for you as well.

## 2019-05-01 NOTE — Telephone Encounter (Signed)
Pt called in requesting her mri results over the phone due to her being high risk due to covid because of her lymphedema and her cancer .  [phone]

## 2019-05-05 NOTE — Telephone Encounter (Signed)
Called patient to advise ordered.

## 2019-05-05 NOTE — Telephone Encounter (Signed)
Order put in for scan. Will call patient to advise.

## 2019-05-05 NOTE — Telephone Encounter (Signed)
I called the patient yesterday.  There was 1 area on the L5 vertebral body which I discussed with Nelson Chimes who said that it was volume flow averaging from the vessel right above.  Was not seen on the axial views on the T1's which makes very good sense.  There is no metastatic disease in her spine on this MRI scan.  Please call her to inform of same.  Thanks

## 2019-05-05 NOTE — Telephone Encounter (Signed)
I spoke with patient this morning and advised as we had discussed yesterday.  She would like to know what plan is for next step. She said that she would like her problem investigated a little further. She reports left side swelling that has gotten worse. She said she spoke with her oncologist at last visit and they suggested possible CT scan? Please advise. Thanks.

## 2019-05-05 NOTE — Telephone Encounter (Signed)
Mri pelvis

## 2019-05-06 ENCOUNTER — Other Ambulatory Visit: Payer: Self-pay

## 2019-05-06 ENCOUNTER — Ambulatory Visit (INDEPENDENT_AMBULATORY_CARE_PROVIDER_SITE_OTHER): Payer: 59 | Admitting: Psychiatry

## 2019-05-06 ENCOUNTER — Encounter: Payer: Self-pay | Admitting: Psychiatry

## 2019-05-06 DIAGNOSIS — F411 Generalized anxiety disorder: Secondary | ICD-10-CM

## 2019-05-06 DIAGNOSIS — F331 Major depressive disorder, recurrent, moderate: Secondary | ICD-10-CM | POA: Diagnosis not present

## 2019-05-06 DIAGNOSIS — F5105 Insomnia due to other mental disorder: Secondary | ICD-10-CM

## 2019-05-06 MED ORDER — DULOXETINE HCL 20 MG PO CPEP: 20.0000 mg | ORAL_CAPSULE | Freq: Every day | ORAL | 1 refills | Status: DC

## 2019-05-06 MED ORDER — BELSOMRA 10 MG PO TABS: 10.0000 mg | ORAL_TABLET | Freq: Every day | ORAL | 1 refills | Status: DC

## 2019-05-06 MED FILL — DULOXETINE HCL 20 MG CPEP: 20 | 30 days supply | Qty: 30 | Fill #0
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
## 2019-05-06 NOTE — Patient Instructions (Signed)
Suvorexant oral tablets What is this medicine? SUVOREXANT (su-vor-EX-ant) is used to treat insomnia. This medicine helps you to fall asleep and sleep through the night. This medicine may be used for other purposes; ask your health care provider or pharmacist if you have questions. COMMON BRAND NAME(S): Belsomra What should I tell my health care provider before I take this medicine? They need to know if you have any of these conditions:  depression  drink alcohol  drug abuse or addiction  feel sleepy or have fallen asleep suddenly during the day  history of a sudden onset of muscle weakness (cataplexy)  liver disease  lung or breathing disease, like asthma or emphysema  sleep apnea  suicidal thoughts, plans, or attempt; a previous suicide attempt by you or a family member  an unusual or allergic reaction to suvorexant, other medicines, foods, dyes, or preservatives  pregnant or trying to get pregnant  breast-feeding How should I use this medicine? Take this medicine by mouth within 30 minutes of going to bed. Do not take it unless you are able to stay in bed a full night before you must be active again. Follow the directions on the prescription label. You may take this medicine with or without a food. However, this medicine may take longer to work if you take it with or right after meals. Do not take your medicine more often than directed. Do not stop taking this medicine on your own. Always follow your doctor or health care professional's advice. A special MedGuide will be given to you by the pharmacist with each prescription and refill. Be sure to read this information carefully each time. Talk to your pediatrician regarding the use of this medicine in children. Special care may be needed. Overdosage: If you think you have taken too much of this medicine contact a poison control center or emergency room at once. NOTE: This medicine is only for you. Do not share this medicine with  others. What if I miss a dose? This medicine should only be taken immediately before going to sleep. Do not take double or extra doses. What may interact with this medicine?  alcohol  antihistamines for allergy, cough, or cold  aprepitant  boceprevir  certain antibiotics like ciprofloxacin, clarithromycin, erythromycin, telithromycin  certain antivirals for HIV or AIDS  certain medicines for anxiety or sleep  certain medicines for depression like amitriptyline, fluoxetine, nefazodone, sertraline  certain medicines for fungal infections like ketoconazole, posaconazole, fluconazole, itraconazole  certain medicines for seizures like carbamazepine, phenobarbital, primidone, phenytoin  conivaptan  digoxin  diltiazem  general anesthetics like halothane, isoflurane, methoxyflurane, propofol  grapefruit juice  imatinib  medicines that relax muscles for surgery  narcotic medicines for pain  phenothiazines like chlorpromazine, mesoridazine, prochlorperazine, thioridazine  rifampin  verapamil This list may not describe all possible interactions. Give your health care provider a list of all the medicines, herbs, non-prescription drugs, or dietary supplements you use. Also tell them if you smoke, drink alcohol, or use illegal drugs. Some items may interact with your medicine. What should I watch for while using this medicine? Visit your health care professional for regular checks on your progress. Tell your health care professional if your symptoms do not start to get better or if they get worse. Avoid caffeine-containing drinks in the evening hours. After taking this medicine, you may get up out of bed and do an activity that you do not know you are doing. The next morning, you may have no memory of this.  Activities include driving a car ("sleep-driving"), making and eating food, talking on the phone, sexual activity, and sleep-walking. Serious injuries have occurred. Call your  doctor right away if you find out you have done any of these activities. Do not take this medicine if you have used alcohol that evening. Do not take it if you have taken another medicine for sleep. °Do not take this medicine unless you are able to stay in bed for a full night (7 to 8 hours) and do not drive or perform other activities requiring full alertness within 8 hours of a dose. Do not drive, use machinery, or do anything that needs mental alertness the day after you take the 20 mg dose of this medicine. The use of lower doses (10 mg) may also cause driving impairment the next day. You may have a decrease in mental alertness the day after use, even if you feel that you are fully awake. Tell your doctor if you will need to perform activities requiring full alertness, such as driving, the next day. Do not stand or sit up quickly after taking this medicine, especially if you are an older patient. This reduces the risk of dizzy or fainting spells. °If you or your family notice any changes in your behavior, such as new or worsening depression, thoughts of harming yourself, anxiety, other unusual or disturbing thoughts, or memory loss, call your health care professional right away. °After you stop taking this medicine, you may have trouble falling asleep. This is called rebound insomnia. This problem usually goes away on its own after 1 or 2 nights. °What side effects may I notice from receiving this medicine? °Side effects that you should report to your doctor or health care professional as soon as possible: °· allergic reactions like skin rash, itching or hives, swelling of the face, lips, or tongue °· hallucinations °· periods of leg weakness lasting from seconds to a few minutes °· suicidal thoughts, mood changes °· unable to move or speak for several minutes while going to sleep or waking up °· unusual activities while not fully awake like driving, eating, making phone calls, or sexual activity °Side effects  that usually do not require medical attention (report these to your doctor or health care professional if they continue or are bothersome): °· daytime drowsiness °· headache °· nightmares or abnormal dreams °· tiredness °This list may not describe all possible side effects. Call your doctor for medical advice about side effects. You may report side effects to FDA at 1-800-FDA-1088. °Where should I keep my medicine? °Keep out of the reach of children. This medicine can be abused. Keep your medicine in a safe place to protect it from theft. Do not share this medicine with anyone. Selling or giving away this medicine is dangerous and against the law. °Store at room temperature between 15 and 30 degrees C (59 and 86 degrees F). Throw away any unused medicine after the expiration date. °NOTE: This sheet is a summary. It may not cover all possible information. If you have questions about this medicine, talk to your doctor, pharmacist, or health care provider. °© 2020 [employer]vier/Gold Standard (2018-09-05 16:37:12) ° °

## 2019-05-07 ENCOUNTER — Ambulatory Visit (INDEPENDENT_AMBULATORY_CARE_PROVIDER_SITE_OTHER): Payer: 59 | Admitting: Licensed Clinical Social Worker

## 2019-05-07 DIAGNOSIS — F331 Major depressive disorder, recurrent, moderate: Secondary | ICD-10-CM | POA: Diagnosis not present

## 2019-05-07 DIAGNOSIS — F5105 Insomnia due to other mental disorder: Secondary | ICD-10-CM

## 2019-05-07 DIAGNOSIS — F411 Generalized anxiety disorder: Secondary | ICD-10-CM

## 2019-05-07 NOTE — Progress Notes (Addendum)
Virtual Visit via Video Note  I connected with Dawn Booker on 05/07/19 at 10:00 AM EDT by a video enabled telemedicine application and verified that I am speaking with the correct person using two identifiers.   I discussed the limitations of evaluation and management by telemedicine and the availability of in person appointments. The patient expressed understanding and agreed to proceed.  I discussed the assessment and treatment plan with the patient. The patient was provided an opportunity to ask questions and all were answered. The patient agreed with the plan and demonstrated an understanding of the instructions.   The patient was advised to call back or seek an in-person evaluation if the symptoms worsen or if the condition fails to improve as anticipated.  I provided 60 minutes of non-face-to-face time during this encounter.  This session completed first half of assessment (see notes in epic) patient referred by cancer center and reports family dysfunction, grief issues, diagnosis of breast cancer in September 2019.  She has lost her job but relates "I cannot worry about that" she rates her anxiety and depression as 5 or 6 out of 10 with 10 being the worst relates reconnection with son is helped improve symptoms.  Relates has had depression anxiety throughout her life.  Reviewed worsening of symptoms when got diagnosis of cancer also reports beginning of this year worsening of symptoms and April when it got really bad.  Describes physical issues that play a part in fatigue and change in energy, pain in the pelvic area and doctors have not found out what is wrong.  Patient is recommended for individual therapy to address depression, anxiety, dysfunction of the family, grief stress management and continue with Dr. Shea Evans for medication management.  Will complete assessment at next session

## 2019-05-12 ENCOUNTER — Telehealth: Payer: Self-pay

## 2019-05-12 DIAGNOSIS — F411 Generalized anxiety disorder: Secondary | ICD-10-CM

## 2019-05-12 DIAGNOSIS — F331 Major depressive disorder, recurrent, moderate: Secondary | ICD-10-CM

## 2019-05-12 MED ORDER — FLUOXETINE HCL 10 MG PO CAPS: 10.0000 mg | ORAL_CAPSULE | Freq: Every day | ORAL | 1 refills | Status: DC

## 2019-05-12 MED FILL — FLUoxetine HCL 10 MG CAPS: 10 | 30 days supply | Qty: 30 | Fill #0

## 2019-05-12 NOTE — Telephone Encounter (Signed)
Called patient back - discussed stopping cymbalta - since she si worried about steven johnson syndrome Will start Prozac 10 mg which she tolerated well in the past. She has not started belsomra yet since she is worried about sleep related problems , discussed to have some supervision at home if she decides to try it.

## 2019-05-12 NOTE — Telephone Encounter (Signed)
Pt has some concern about medication. 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

## 2019-05-15 ENCOUNTER — Encounter

## 2019-05-15 DIAGNOSIS — M25559 Pain in unspecified hip: Secondary | ICD-10-CM | POA: Diagnosis not present

## 2019-05-18 ENCOUNTER — Encounter: Payer: Self-pay | Admitting: Adult Health

## 2019-05-20 ENCOUNTER — Telehealth: Payer: Self-pay | Admitting: Orthopedic Surgery

## 2019-05-20 ENCOUNTER — Encounter: Payer: Self-pay | Admitting: Adult Health

## 2019-05-20 NOTE — Telephone Encounter (Signed)
Please advise. Thanks.  Report on your computer.

## 2019-05-20 NOTE — Telephone Encounter (Signed)
I called and scheduled the patient for 05/26/19 at 10:40 am (worked best for her schedule).

## 2019-05-20 NOTE — Telephone Encounter (Signed)
Please refer to my [facility] for left hip injection under ultrasound thanks

## 2019-05-20 NOTE — Telephone Encounter (Signed)
Patient called. She would like to know if her MRI results was received or not. Her call back number is [phone]. Thanks

## 2019-05-20 NOTE — Telephone Encounter (Signed)
Dr Marlou Sa wants patient to have injection in left hip under ultrasound. Can you please get her scheduled?

## 2019-05-21 ENCOUNTER — Telehealth: Payer: Self-pay

## 2019-05-21 ENCOUNTER — Other Ambulatory Visit: Payer: Self-pay

## 2019-05-21 ENCOUNTER — Ambulatory Visit: Payer: 59 | Attending: General Surgery

## 2019-05-21 DIAGNOSIS — Z483 Aftercare following surgery for neoplasm: Secondary | ICD-10-CM | POA: Diagnosis not present

## 2019-05-21 DIAGNOSIS — Z170 Estrogen receptor positive status [ER+]: Secondary | ICD-10-CM | POA: Diagnosis not present

## 2019-05-21 DIAGNOSIS — L599 Disorder of the skin and subcutaneous tissue related to radiation, unspecified: Secondary | ICD-10-CM | POA: Diagnosis not present

## 2019-05-21 DIAGNOSIS — M25612 Stiffness of left shoulder, not elsewhere classified: Secondary | ICD-10-CM | POA: Diagnosis not present

## 2019-05-21 DIAGNOSIS — R293 Abnormal posture: Secondary | ICD-10-CM | POA: Insufficient documentation

## 2019-05-21 DIAGNOSIS — M6281 Muscle weakness (generalized): Secondary | ICD-10-CM | POA: Insufficient documentation

## 2019-05-21 DIAGNOSIS — C50812 Malignant neoplasm of overlapping sites of left female breast: Secondary | ICD-10-CM | POA: Insufficient documentation

## 2019-05-21 DIAGNOSIS — I972 Postmastectomy lymphedema syndrome: Secondary | ICD-10-CM | POA: Diagnosis not present

## 2019-05-21 NOTE — Telephone Encounter (Signed)
pt called and stated that the prozac is causing her heart palpations and her chest heavy. and she doesnt like the way it feel  so she not going to take. pt also states she not going to take the belsomra. she going to try some essential oils.

## 2019-05-21 NOTE — Telephone Encounter (Signed)
Returned call to patient.  She reports she is currently having side effects to Prozac.  She had heart palpitation.  She had stopped taking it.  She also does not want to take the [place].  Discussed with patient that since she is having side effects to medications and she is worried about starting new medications would recommend a genetic testing.  We will transfer her to her CMA who can give her more information.  Also discussed with her to make use of more psychotherapy sessions with her therapist.

## 2019-05-21 NOTE — Therapy (Signed)
[place], [place], [address] Phone: [phone]   Fax:  [phone]  Physical Therapy Re-Evaluation  Patient Details  Name: Dawn Booker MRN: [id number] Date of Birth: Jun 13, 1956 Referring Provider (PT): Dr.Gudena    Encounter Date: 05/21/2019  PT End of Session - 05/21/19 1703    Visit Number  1    Number of Visits  5    Date for PT Re-Evaluation  06/25/19    PT Start Time  1500    PT Stop Time  1600    PT Time Calculation (min)  60 min    Activity Tolerance  Patient tolerated treatment well    Behavior During Therapy  [facility] for tasks assessed/performed       Past Medical History:  Diagnosis Date  . ADD (attention deficit disorder)   . Anxiety   . Cancer [facility])    breast cancer  . Depression   . GERD (gastroesophageal reflux disease)   . Hiatal hernia   . History of radiation therapy 08/13/2018- 09/25/2018   Left Chest wall and IM nodes/ 50 Gy in 25 fractions, Left supraclaicular fossa and PAB/ 50 Gy in 25 fractions, Left chest wall scar boost/ 10 Gy in 5 fractions.   . Hyperlipemia, mixed   . Intermittent palpitations   . Malignant neoplasm of overlapping sites of left breast in female, estrogen receptor positive ([place]) 05/06/2018   oncologist-  dr Lindi Adie--  Invasive Lobular Cancer, CIS, Stage IB, Grade 2 (pT3,pN1a,cM0), ER+---  s/p  left mastectomy with sln dissections and right mastectomy (benign)  . PONV (postoperative nausea and vomiting)   . Right thyroid nodule   . Shingles 12/2017    Past Surgical History:  Procedure Laterality Date  . AXILLARY LYMPH NODE DISSECTION Left 07/09/2018   Procedure: COMPLETION OF LEFT AXILLARY LYMPH NODE DISSECTION;  Surgeon: Jovita Kussmaul, MD;  Location: [facility];  Service: General;  Laterality: Left;  . BREAST EXCISIONAL BIOPSY Left 1995   benign  . CARDIOVASCULAR STRESS TEST  06/21/2017   normal nuclear stress study w/ no ischemia/  normal LV  function and wall function, nuclear stress ef 70%  . ELBOW SURGERY Left child  . MASTECTOMY W/ SENTINEL NODE BIOPSY Left 06/13/2018  . MASTECTOMY WITH RADIOACTIVE SEED GUIDED EXCISION AND AXILLARY SENTINEL LYMPH NODE BIOPSY Bilateral 06/13/2018   Procedure: LEFT MASTECTOMY WITH SENTINEL LYMPH NODE MAPPING AND TARGETED NODE DISSECTION AND RIGHT PROPHYLATIC MASTECTOMY;  Surgeon: Jovita Kussmaul, MD;  Location: [facility];  Service: General;  Laterality: Bilateral;  . THYROIDECTOMY Left 10-31-2001   dr gerkin '@[facility]'   . TRANSTHORACIC ECHOCARDIOGRAM  06/21/2017   ef 60-65%/  trivial MR (no evidence mvp)  . TUBAL LIGATION  yrs ago  . VAGINAL HYSTERECTOMY  1990s    There were no vitals filed for this visit.  Subjective Assessment - 05/21/19 1547    Subjective  Pt reports that her swelling is getting worse and that she is getting edema in her fingers. Pt states that she was not wearing her sleeve this morning until she left her house. She reports that usually she will put her sleeve on and then after 30 min her knuckles are really swollen and painful. She also reports that she has some edema in her back.    Pertinent History  Patient was diagnosed on 04/25/18 with left grade II invasive lobular carci1noma breast cancer. There are 2 areas: 2.6 cm in the upper outer quadrant and 6 mm in  the upper inner quadrant. Both are ER/PR positive and HER2 negative with a Ki67 of 10% and she has a positive axillary node. Bilateral mastectomy on 06/13/2018 wiht 7 nodes removed and the 10 in second surgery on 07/09/2018  She had radiation and did well.    Patient Stated Goals  to be able to get range of motion back in her arm    Currently in Pain?  Yes    Pain Score  5     Pain Location  Axilla   hand, back, chest and R cervical area   Pain Orientation  Left    Pain Descriptors / Indicators  Tightness    Pain Type  Chronic pain    Pain Onset  More than a month ago    Pain Frequency  Constant         [facility] PT  Assessment - 05/21/19 0001      Assessment   Medical Diagnosis  Left breast cancer    Referring Provider (PT)  Dr.Gudena     Onset Date/Surgical Date  04/25/18    Hand Dominance  Right      Precautions   Precautions  None;Other (comment)    Precaution Comments  limited activity of left hip       Restrictions   Weight Bearing Restrictions  No      Prior Function   Level of Independence  Independent    Leisure  limited exercise due to hip pain       Observation/Other Assessments-Edema    Edema  Circumferential      AROM   Left Shoulder Extension  34 Degrees    Left Shoulder Flexion  137 Degrees    Left Shoulder ABduction  130 Degrees    Left Shoulder Internal Rotation  47 Degrees    Left Shoulder External Rotation  80 Degrees        LYMPHEDEMA/ONCOLOGY QUESTIONNAIRE - 05/21/19 1521      Left Upper Extremity Lymphedema   15 cm Proximal to Olecranon Process  32.7 cm    10 cm Proximal to Olecranon Process  29 cm    Olecranon Process  24.3 cm    15 cm Proximal to Ulnar Styloid Process  23.7 cm    10 cm Proximal to Ulnar Styloid Process  20.5 cm    Just Proximal to Ulnar Styloid Process  16 cm    Across Hand at [employer]  19.7 cm    At [place] of 2nd Digit  6.5 cm    Other  Under axilla 97.6    Other  across scars 92.5    Other  24 cm from sternal notch 94.4 cm                         PT Education - 05/21/19 1659    Education Details  Significant education on the importance of observation and adjusting her arm garment. Discussed proper wear of the arm garment due to pt presented with arm garmentpulled below the styloid process resulting in possible increased edema of the hand. Pt was educated on the use of compression garment for her chest area to address edema in the posterior superior quadrant and the possibility of getting her pump adjusted by flexitouch due to she is reporting significant compression in the brachium which could be pushing fluid  inferiorly. Discussed learning MLD for home due to effectiveness when vasopneumatic pump is not available. Derek from [employer] was contact  for pump adjustment either at home or in the clinic.    Person(s) Educated  Patient    Methods  Explanation    Comprehension  Verbalized understanding;Need further instruction       PT Short Term Goals - 05/21/19 1710      PT SHORT TERM GOAL #4   Title  Pt will have reduction in LUE circumferential measurements by 0.5 cm or greater within 2 weeks in order to demonstrate improved utilization of garment and pump.    Time  2    Period  Weeks    Status  New    Target Date  06/11/19        PT Long Term Goals - 05/21/19 1709      PT LONG TERM GOAL #3   Title  Pt will report she is able to manage her symptoms of lymphedema on her own at home    Time  4    Period  Weeks    Status  On-going    Target Date  06/25/19            Plan - 05/21/19 1555    Clinical Impression Statement  Pt ROM is improving significantly since her last visit. She presents with increased edema in her LUE and possible edema in her posterior L quadrant. Pt will benefit from education on self MLD, proper compression fit, proper compression of flexitouch and further education on lymphedema management in order to decrease re-occurrence of increased swelling due to lymphedema. Skilled physical therapy services recommended to continue 1x/week for 4 more weeks in order to improve pt quality of life and risk for infection/re-occurance.    Personal Factors and Comorbidities  Comorbidity 2    Comorbidities  2 surgeries for breast cancer, radiation    Stability/Clinical Decision Making  Stable/Uncomplicated    Clinical Decision Making  Low    Clinical Presentation due to:  Pt had pelvic MRI done at [facility] due to hip pain. Her MD has not seen the MRI results yet.    Rehab Potential  Good    Clinical Impairments Affecting Rehab Potential  17 nodes removed with second surgery  needed for lymph node dissection , radiation     PT Frequency  1x / week    PT Duration  4 weeks    PT Treatment/Interventions  ADLs/Self Care Home Management;DME Instruction;Therapeutic activities;Therapeutic exercise;Orthotic Fit/Training;Patient/family education;Manual techniques;Neuromuscular re-education;Manual lymph drainage;Compression bandaging;Scar mobilization;Taping;Passive range of motion    PT Next Visit Plan  Per form MLD, teach MLD and provide handout. Second visit check fit of arm sleeve and glove. Derek from [employer] touch should adjust setting to decrease compression in the brachial area.    PT Home Exercise Plan  Post op shoulder ROM HEP, corner stretch, supine scap    Consulted and Agree with Plan of Care  Patient       Patient will benefit from skilled therapeutic intervention in order to improve the following deficits and impairments:  Decreased knowledge of precautions, Pain, Impaired UE functional use, Decreased range of motion, Postural dysfunction, Decreased skin integrity, Increased fascial restricitons, Decreased scar mobility, Impaired perceived functional ability, Decreased strength, Decreased activity tolerance, Increased edema  Visit Diagnosis: Stiffness of left shoulder joint  Abnormal posture  Muscle weakness (generalized)  Disorder of the skin and subcutaneous tissue related to radiation, unspecified  Aftercare following surgery for neoplasm  Postmastectomy lymphedema  Malignant neoplasm of overlapping sites of left breast in female, estrogen receptor positive ([place])  Problem List Patient Active Problem List   Diagnosis Date Noted  . MDD (major depressive disorder), recurrent episode, moderate ([place]) 05/06/2019  . GAD (generalized anxiety disorder) 05/06/2019  . Insomnia due to mental condition 05/06/2019  . Cancer of left female breast  ([place]) 06/13/2018  . Malignant neoplasm of overlapping sites of left breast in female, estrogen receptor  positive ([place]) 05/09/2018  . Blurring of visual image of both eyes 12/06/2017  . Postherpetic neuralgia 12/06/2017  . Nasal turbinate hypertrophy 10/09/2016  . Nasal congestion 08/31/2016  . Deviated septum 08/31/2016  . ESOPHAGEAL REFLUX 10/10/2009    Ander Purpura, PT 05/21/2019, 5:12 PM  [place], [place], [address] Phone: [phone]   Fax:  [phone]  Name: MARCELYN RUPPE MRN: [id number] Date of Birth: 02-21-1956

## 2019-05-23 MED FILL — LORazepam 0.5 MG TABS: 0.5 | 10 days supply | Qty: 30 | Fill #0

## 2019-05-25 ENCOUNTER — Ambulatory Visit: Payer: 59

## 2019-05-26 ENCOUNTER — Ambulatory Visit (INDEPENDENT_AMBULATORY_CARE_PROVIDER_SITE_OTHER): Payer: 59 | Admitting: Family Medicine

## 2019-05-26 ENCOUNTER — Encounter: Payer: Self-pay | Admitting: Family Medicine

## 2019-05-26 ENCOUNTER — Other Ambulatory Visit: Payer: Self-pay

## 2019-05-26 DIAGNOSIS — M25552 Pain in left hip: Secondary | ICD-10-CM | POA: Diagnosis not present

## 2019-05-26 DIAGNOSIS — M79605 Pain in left leg: Secondary | ICD-10-CM | POA: Diagnosis not present

## 2019-05-26 NOTE — Progress Notes (Signed)
Office Visit Note   Patient: Dawn Booker           Date of Birth: [birth year]           MRN: [id number] Visit Date: 05/26/2019 Requested by: Lawerance Cruel, [place],  [place] [address] PCP: Lawerance Cruel, MD  Subjective: Chief Complaint  Patient presents with   Left Hip - Pain    US- guided cortisone injection per Dr.Dean    HPI: She is here for left hip pain.  Per Dr. Marlou Sa, she is to have an ultrasound-guided injection for diagnostic/therapeutic purposes.  Pain in the groin with activity, inhibiting her ability to exercise.  Today she is not having much pain.              ROS:   All other systems were reviewed and are negative.  Objective: Vital Signs: There were no vitals taken for this visit.  Physical Exam:  General:  Alert and oriented, in no acute distress. Pulm:  Breathing unlabored. Psy:  Normal mood, congruent affect.  Left hip: She really did not have much pain with passive flexion and internal rotation.  Imaging: None today other than for injection guidance.  Assessment & Plan: 1.  Left hip pain possibly due to DJD -Diagnostic injection today.  Hopefully therapeutic as well.  Follow-up with Dr. Marlou Sa as scheduled.     Procedures: Ultrasound-guided left hip injection: After sterile prep with Betadine, injected 8 cc 1% lidocaine without epinephrine and 40 mg methylprednisolone passing the needle through the iliofemoral ligament into the femoral head/neck junction using a 22-gauge spinal needle.  Injectate was seen filling the joint capsule.    PMFS History: Patient Active Problem List   Diagnosis Date Noted   MDD (major depressive disorder), recurrent episode, moderate ([place]) 05/06/2019   GAD (generalized anxiety disorder) 05/06/2019   Insomnia due to mental condition 05/06/2019   Cancer of left female breast  ([place]) 06/13/2018   Malignant neoplasm of overlapping sites of left breast in female, estrogen receptor  positive ([place]) 05/09/2018   Blurring of visual image of both eyes 12/06/2017   Postherpetic neuralgia 12/06/2017   Nasal turbinate hypertrophy 10/09/2016   Nasal congestion 08/31/2016   Deviated septum 08/31/2016   ESOPHAGEAL REFLUX 10/10/2009    Family History  Problem Relation Age of Onset   Hypertension Mother    Heart attack Mother    Breast cancer Mother    Bipolar disorder Father    Breast cancer Maternal Grandmother    Breast cancer Cousin     Past Surgical History:  Procedure Laterality Date   AXILLARY LYMPH NODE DISSECTION Left 07/09/2018   Procedure: COMPLETION OF LEFT AXILLARY LYMPH NODE DISSECTION;  Surgeon: [name], MD;  Location: [facility];  Service: General;  Laterality: Left;   BREAST EXCISIONAL BIOPSY Left 1995   benign   CARDIOVASCULAR STRESS TEST  06/21/2017   normal nuclear stress study w/ no ischemia/  normal LV function and wall function, nuclear stress ef 70%   ELBOW SURGERY Left child   MASTECTOMY W/ SENTINEL NODE BIOPSY Left 06/13/2018   MASTECTOMY WITH RADIOACTIVE SEED GUIDED EXCISION AND AXILLARY SENTINEL LYMPH NODE BIOPSY Bilateral 06/13/2018   Procedure: LEFT MASTECTOMY WITH SENTINEL LYMPH NODE MAPPING AND TARGETED NODE DISSECTION AND RIGHT PROPHYLATIC MASTECTOMY;  Surgeon: Jovita Kussmaul, MD;  Location: [facility];  Service: General;  Laterality: Bilateral;   THYROIDECTOMY Left 10-31-2001   dr gerkin @[facility]    TRANSTHORACIC ECHOCARDIOGRAM  06/21/2017   ef 60-65%/  trivial MR (no evidence mvp)   TUBAL LIGATION  yrs ago   [place] History   Occupational History   Not on file  Tobacco Use   Smoking status: Former Smoker    Years: 0.00    Types: Cigarettes    Quit date: 12/05/2017    Years since quitting: 1.4   Smokeless tobacco: Never Used  Substance and Sexual Activity   Alcohol use: Not Currently   Drug use: No   Sexual activity: Not on file

## 2019-05-27 MED FILL — TEMAZEPAM 30 MG CAPSULE: 30 | 30 days supply | Qty: 30 | Fill #1

## 2019-06-01 ENCOUNTER — Ambulatory Visit: Payer: 59

## 2019-06-01 ENCOUNTER — Other Ambulatory Visit: Payer: Self-pay

## 2019-06-01 DIAGNOSIS — R293 Abnormal posture: Secondary | ICD-10-CM | POA: Diagnosis not present

## 2019-06-01 DIAGNOSIS — M25612 Stiffness of left shoulder, not elsewhere classified: Secondary | ICD-10-CM

## 2019-06-01 DIAGNOSIS — I972 Postmastectomy lymphedema syndrome: Secondary | ICD-10-CM

## 2019-06-01 DIAGNOSIS — C50812 Malignant neoplasm of overlapping sites of left female breast: Secondary | ICD-10-CM

## 2019-06-01 DIAGNOSIS — L599 Disorder of the skin and subcutaneous tissue related to radiation, unspecified: Secondary | ICD-10-CM | POA: Diagnosis not present

## 2019-06-01 DIAGNOSIS — Z483 Aftercare following surgery for neoplasm: Secondary | ICD-10-CM

## 2019-06-01 DIAGNOSIS — Z170 Estrogen receptor positive status [ER+]: Secondary | ICD-10-CM | POA: Diagnosis not present

## 2019-06-01 DIAGNOSIS — M6281 Muscle weakness (generalized): Secondary | ICD-10-CM

## 2019-06-01 NOTE — Patient Instructions (Signed)

## 2019-06-01 NOTE — Therapy (Signed)
[facility] [address] [place], [place], [address] Phone: [phone]   Fax:  [phone]  Physical Therapy Treatment  Patient Details  Name: Dawn Booker MRN: [id number] Date of Birth: 12/17/1955 Referring Provider (PT): Dr.Gudena    Encounter Date: 06/01/2019  PT End of Session - 06/01/19 1051    Visit Number  2    Number of Visits  5    Date for PT Re-Evaluation  06/25/19    PT Start Time  1007    PT Stop Time  1105    PT Time Calculation (min)  58 min    Activity Tolerance  Patient tolerated treatment well    Behavior During Therapy  WFL for tasks assessed/performed       Past Medical History:  Diagnosis Date  . ADD (attention deficit disorder)   . Anxiety   . Cancer (HCC)    breast cancer  . Depression   . GERD (gastroesophageal reflux disease)   . Hiatal hernia   . History of radiation therapy 08/13/2018- 09/25/2018   Left Chest wall and IM nodes/ 50 Gy in 25 fractions, Left supraclaicular fossa and PAB/ 50 Gy in 25 fractions, Left chest wall scar boost/ 10 Gy in 5 fractions.   . Hyperlipemia, mixed   . Intermittent palpitations   . Malignant neoplasm of overlapping sites of left breast in female, estrogen receptor positive (HCC) 05/06/2018   oncologist-  dr gudena--  Invasive Lobular Cancer, CIS, Stage IB, Grade 2 (pT3,pN1a,cM0), ER+---  s/p  left mastectomy with sln dissections and right mastectomy (benign)  . PONV (postoperative nausea and vomiting)   . Right thyroid nodule   . Shingles 12/2017    Past Surgical History:  Procedure Laterality Date  . AXILLARY LYMPH NODE DISSECTION Left 07/09/2018   Procedure: COMPLETION OF LEFT AXILLARY LYMPH NODE DISSECTION;  Surgeon: Toth, Paul III, MD;  Location: [facility];  Service: General;  Laterality: Left;  . BREAST EXCISIONAL BIOPSY Left 1995   benign  . CARDIOVASCULAR STRESS TEST  06/21/2017   normal nuclear stress study w/ no ischemia/  normal LV function  and wall function, nuclear stress ef 70%  . ELBOW SURGERY Left child  . MASTECTOMY W/ SENTINEL NODE BIOPSY Left 06/13/2018  . MASTECTOMY WITH RADIOACTIVE SEED GUIDED EXCISION AND AXILLARY SENTINEL LYMPH NODE BIOPSY Bilateral 06/13/2018   Procedure: LEFT MASTECTOMY WITH SENTINEL LYMPH NODE MAPPING AND TARGETED NODE DISSECTION AND RIGHT PROPHYLATIC MASTECTOMY;  Surgeon: Toth, Paul III, MD;  Location: [facility];  Service: General;  Laterality: Bilateral;  . THYROIDECTOMY Left 10-31-2001   dr gerkin @[facility]  . TRANSTHORACIC ECHOCARDIOGRAM  06/21/2017   ef 60-65%/  trivial MR (no evidence mvp)  . TUBAL LIGATION  yrs ago  . VAGINAL HYSTERECTOMY  1990s    There were no vitals filed for this visit.  Subjective Assessment - 06/01/19 1014    Subjective  Pt states that she has been doing much better since last week. She has not been wearing her compression as much and her hand swelling has gone down. Pt reports that she got an injection in her hip last week which did not help relieve much of her groin pain.    Pertinent History  Patient was diagnosed on 04/25/18 with left grade II invasive lobular carci1noma breast cancer. There are 2 areas: 2.6 cm in the upper outer quadrant and 6 mm in the upper inner quadrant. Both are ER/PR positive and HER2 negative with a Ki67 of 10%   and she has a positive axillary node. Bilateral mastectomy on 06/13/2018 wiht 7 nodes removed and the 10 in second surgery on 07/09/2018  She had radiation and did well.    Patient Stated Goals  to be able to get range of motion back in her arm    Currently in Pain?  Yes    Pain Score  3     Pain Location  Axilla    Pain Orientation  Left    Pain Descriptors / Indicators  Tightness    Pain Type  Chronic pain    Pain Onset  More than a month ago    Pain Frequency  Constant    Aggravating Factors   Sudden movements    Pain Relieving Factors  medication if needed.         [facility] PT Assessment - 06/01/19 0001      Precautions    Precautions  None;Other (comment)    Precaution Comments  limited activity of left hip       Restrictions   Weight Bearing Restrictions  No        LYMPHEDEMA/ONCOLOGY QUESTIONNAIRE - 06/01/19 1017      Left Upper Extremity Lymphedema   15 cm Proximal to Olecranon Process  30.8 cm    10 cm Proximal to Olecranon Process  27.7 cm    Olecranon Process  24 cm    15 cm Proximal to Ulnar Styloid Process  23.5 cm    10 cm Proximal to Ulnar Styloid Process  20.5 cm    Just Proximal to Ulnar Styloid Process  15.8 cm    Across Hand at [employer]  19 cm    At [place] of 2nd Digit  6.5 cm                [facility] Adult PT Treatment/Exercise - 06/01/19 0001      Manual Therapy   Manual Therapy  Manual Lymphatic Drainage (MLD)    Manual therapy comments  Remeasured LUE     Manual Lymphatic Drainage (MLD)  In supine, MLD performed with Pt instructed throughout on self MLD including axillo-axillary anastomosis and axillo-inguinal anastomosis from L>R and on the L with the entire arm sequence including the hand but not the fingers. Extra time spent on direction and pressure.              PT Education - 06/01/19 1029    Education Details  92cm chest, 92 cm waist, 106 cm hip pt was provided measurements for compression tank top. Discussed performing MLD at home for a week or two to see if this helps with fluid. Pt was provided w/MLD self sequence for home.    Person(s) Educated  Patient    Methods  Explanation;Demonstration;Tactile cues;Verbal cues;Handout    Comprehension  Verbalized understanding;Returned demonstration       PT Short Term Goals - 05/21/19 1710      PT SHORT TERM GOAL #4   Title  Pt will have reduction in LUE circumferential measurements by 0.5 cm or greater within 2 weeks in order to demonstrate improved utilization of garment and pump.    Time  2    Period  Weeks    Status  New    Target Date  06/11/19        PT Long Term Goals - 05/21/19 1709      PT  LONG TERM GOAL #3   Title  Pt will report she is able to manage her  symptoms of lymphedema on her own at home    Time  4    Period  Weeks    Status  On-going    Target Date  06/25/19            Plan - 06/01/19 1052    Clinical Impression Statement  Pt presents to phyiscal therapy this session with improvement in her edema in the LUE following our conversation on correct wear of the compression garment in order to prevent edema in the hand. She continues with soft non-pitting edema in her LUQ. Discussed and sized pt for compression this session. Self MLD was taught this session with a focus on direction and pressure for adequate lymphatic stimulation. Derek from [employer] came to the clinic at the end of session to fit the compression sleeve to provide adequate compression for pumping. Pt will benefit from continued POC.    Personal Factors and Comorbidities  Comorbidity 2    Comorbidities  2 surgeries for breast cancer, radiation    Rehab Potential  Good    Clinical Impairments Affecting Rehab Potential  17 nodes removed with second surgery needed for lymph node dissection , radiation     PT Frequency  1x / week    PT Duration  4 weeks    PT Treatment/Interventions  ADLs/Self Care Home Management;DME Instruction;Therapeutic activities;Therapeutic exercise;Orthotic Fit/Training;Patient/family education;Manual techniques;Neuromuscular re-education;Manual lymph drainage;Compression bandaging;Scar mobilization;Taping;Passive range of motion    PT Next Visit Plan  Give Lymphedema exercise sequence for UE    Recommended Other Services  Drek from [employer] is coming after visit today.    Consulted and Agree with Plan of Care  Patient    Family Member Consulted  Husband, daughter       Patient will benefit from skilled therapeutic intervention in order to improve the following deficits and impairments:  Decreased knowledge of precautions, Pain, Impaired UE functional use, Decreased range of  motion, Postural dysfunction, Decreased skin integrity, Increased fascial restricitons, Decreased scar mobility, Impaired perceived functional ability, Decreased strength, Decreased activity tolerance, Increased edema  Visit Diagnosis: Stiffness of left shoulder joint  Abnormal posture  Muscle weakness (generalized)  Disorder of the skin and subcutaneous tissue related to radiation, unspecified  Aftercare following surgery for neoplasm  Postmastectomy lymphedema  Malignant neoplasm of overlapping sites of left breast in female, estrogen receptor positive (HCC)     Problem List Patient Active Problem List   Diagnosis Date Noted  . MDD (major depressive disorder), recurrent episode, moderate (HCC) 05/06/2019  . GAD (generalized anxiety disorder) 05/06/2019  . Insomnia due to mental condition 05/06/2019  . Cancer of left female breast  (HCC) 06/13/2018  . Malignant neoplasm of overlapping sites of left breast in female, estrogen receptor positive (HCC) 05/09/2018  . Blurring of visual image of both eyes 12/06/2017  . Postherpetic neuralgia 12/06/2017  . Nasal turbinate hypertrophy 10/09/2016  . Nasal congestion 08/31/2016  . Deviated septum 08/31/2016  . ESOPHAGEAL REFLUX 10/10/2009     H , PT 06/01/2019, 11:12 AM  [facility] [address] [place], , [address] Phone: [phone]   Fax:  [phone]  Name: Dawn Booker MRN: [id number] Date of Birth: 03/10/1956   

## 2019-06-04 ENCOUNTER — Ambulatory Visit: Payer: 59 | Admitting: Psychiatry

## 2019-06-08 ENCOUNTER — Ambulatory Visit (INDEPENDENT_AMBULATORY_CARE_PROVIDER_SITE_OTHER): Payer: 59 | Admitting: Licensed Clinical Social Worker

## 2019-06-08 DIAGNOSIS — F5105 Insomnia due to other mental disorder: Secondary | ICD-10-CM

## 2019-06-08 DIAGNOSIS — F411 Generalized anxiety disorder: Secondary | ICD-10-CM | POA: Diagnosis not present

## 2019-06-08 DIAGNOSIS — F331 Major depressive disorder, recurrent, moderate: Secondary | ICD-10-CM | POA: Diagnosis not present
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
## 2019-06-09 ENCOUNTER — Ambulatory Visit (INDEPENDENT_AMBULATORY_CARE_PROVIDER_SITE_OTHER): Payer: 59 | Admitting: Family Medicine

## 2019-06-09 ENCOUNTER — Other Ambulatory Visit: Payer: Self-pay

## 2019-06-09 ENCOUNTER — Encounter: Payer: Self-pay | Admitting: Family Medicine

## 2019-06-09 ENCOUNTER — Telehealth: Payer: Self-pay

## 2019-06-09 VITALS — BP 137/88 | HR 89 | Ht 65.25 in | Wt 159.0 lb

## 2019-06-09 DIAGNOSIS — C50812 Malignant neoplasm of overlapping sites of left female breast: Secondary | ICD-10-CM

## 2019-06-09 DIAGNOSIS — F411 Generalized anxiety disorder: Secondary | ICD-10-CM | POA: Diagnosis not present

## 2019-06-09 DIAGNOSIS — Z170 Estrogen receptor positive status [ER+]: Secondary | ICD-10-CM

## 2019-06-09 DIAGNOSIS — Z23 Encounter for immunization: Secondary | ICD-10-CM | POA: Insufficient documentation

## 2019-06-09 DIAGNOSIS — F331 Major depressive disorder, recurrent, moderate: Secondary | ICD-10-CM

## 2019-06-09 DIAGNOSIS — Z7689 Persons encountering health services in other specified circumstances: Secondary | ICD-10-CM

## 2019-06-09 DIAGNOSIS — Z9012 Acquired absence of left breast and nipple: Secondary | ICD-10-CM | POA: Diagnosis not present

## 2019-06-09 NOTE — Progress Notes (Addendum)
New patient office visit note:  Impression and Recommendations:    1. Encounter to establish care with new doctor   2. GAD (generalized anxiety disorder)-sees and is treated by psychiatry   3. MDD (major depressive disorder), recurrent episode, moderate (HCC)-sees and is treated by psychiatry   4. Malignant neoplasm of overlapping sites of left breast in female, estrogen receptor positive ([place])   5. H/O left mastectomy   6. Need for vaccination     - Patient today states did not fill out intake sheet because she is already in Epic and thought she did not need to.  Encounter to Establish Care with New Doctor - Extensive discussion held with patient regarding establishing as a new patient.  Discussed policies and practices here at the clinic, and answered all questions about care team and health management during appointment.  - Discussed need for patient to continue to obtain management and screenings with all established specialists.  Educated patient at length about the critical importance of keeping health maintenance up to date.  - Participated in lengthy conversation and all questions were answered.  - Need for lab work and CPE in near future.  History of Breast Cancer - Strongly encouraged patient to ask oncology about her concerns related to recovery from cancer treatment. - Patient knows to continue to obtain management and care through oncology as recommended. - Will continue to monitor.  MDD, GAD - Managed by Psychiatry - Strongly recommended that patient continue to speak with psychiatry about her concerns. - Advised patient to seek genetic testing through psychiatry to find ideal mood medication for her.  - Advised patient to continue to obtain prescriptions from specialist as established. - Discussed with patient importance of keeping all prescription management under care of designated specialist to prevent complications with medication.  - STRONGLY advised  patient to continue to obtain therapy/life coaching/counseling.  - In addition to prescription intervention and therapy/counseling, reviewed the "spokes of the wheel" of mood and health management.  Stressed the importance of ongoing prudent habits, including regular exercise, appropriate sleep hygiene, healthful dietary habits, and prayer/meditation to relax.  - Recommended that patient seek support from online life after breast cancer support groups.  - Will continue to monitor.  Insomnia associated with MDD, GAD - Advised patient to continue to obtain prescriptions from psychiatry as established. - Discussed with patient importance of keeping all prescription management under care of designated specialist to prevent complications with medication. - Will continue to monitor  [place] patient to continue working toward exercising to improve overall mental, physical, and emotional health.    - Encouraged patient to engage in daily physical activity, especially a formal exercise routine.  Recommended that the patient eventually strive for at least 150 minutes of moderate cardiovascular activity per week according to guidelines established by the [facility].   - Healthy dietary habits encouraged, including low-carb, and high amounts of lean protein in diet.   - Patient should also consume adequate amounts of water.  - Health counseling performed.  All questions answered.    Education and routine counseling performed. Handouts provided.   Orders Placed This Encounter  Procedures   Flu Vaccine QUAD 36+ mos IM   CBC with Differential/Platelet   Hemoglobin A1c   T4, free   T3   TSH    Medications Discontinued During This Encounter  Medication Reason   Calcium Carbonate-Vitamin D (CALCIUM 500/D PO) Change in therapy  rosuvastatin (CRESTOR) 10 MG tablet Change in therapy    Gross side effects, risk and benefits, and  alternatives of medications discussed with patient.  Patient is aware that all medications have potential side effects and we are unable to predict every side effect or drug-drug interaction that may occur.  Expresses verbal understanding and consents to current therapy plan and treatment regimen.  Return for Next available with [facility] three days prior..  Please see AVS handed out to patient at the end of our visit for further patient instructions/ counseling done pertaining to today's office visit.    Note:  This document was prepared using Dragon voice recognition software and may include unintentional dictation errors.   This document serves as a record of services personally performed by Dawn Dance, DO. It was created on her behalf by Dawn Booker, a trained medical scribe. The creation of this record is based on the scribe's personal observations and the provider's statements to them.   I have reviewed the above medical documentation for accuracy and completeness and I concur.  Dawn Dance, DO 06/09/2019 1:53 PM       ---------------------------------------------------------------------------------------------------------------------------------------------------------------------------------------------    Subjective:    Chief complaint:   Chief Complaint  Patient presents with   Establish Care     HPI: Dawn Booker is a pleasant 63 y.o. female who presents to [facility] today to review their medical history with me and establish care.   I asked the patient to review their chronic problem list with me to ensure everything was updated and accurate.    All recent office visits with other providers, any medical records that patient brought in etc  - I reviewed today.     We asked pt to get [place] their medical records from [facility] providers/ specialists that they had seen within the past 3-5 years- if they are in private practice and/or  do not work for [employer], [facility], [place], [place] or [employer] owned practice.  Told them to call their specialists to clarify this if they are not sure.    Her Primary Care was Dr. Melinda Booker, out at [facility].  Notes lives 10 minutes from here and wished to transfer somewhere closer.  Notes would like her primary care taken over.  Social History Notes "you see my husband, so I don't have any questions." Says her husband has lost weight and "looks really really good."  Tobacco Use History of smoking; thinks she quit in April of 2019. Smoked "a few cigarettes a day" since her 35's.  Alcohol Use   Family History Mom & maternal grandmother both had breast cancer. Mom died in 04-17-17.  One uncle and three aunts on mother's side with diabetes, and first cousin.   Past Medical History  Hasn't worked since April 6th of 2020.  Says she hasn't worked "due to the effects on her body mentally and physically since her surgeries."  Feels she is financially able to retire without stress about money.  Thinks her last physical was in June or 2018/04/17.  Had some blood work done in August.  - GI Follow-Up Sees Dr. Collene Mares of [facility].  Has a history of GERD.  Notes she had an endoscopy but hasn't had a colonoscopy recently (states she is up to date).  - Breast Cancer & History of Bilateral Mastectomy in 2019 ER+ breast cancer and lymphedema; under care of oncology, Dr. Lindi Adie.  States she had  radiation, no chemo.  Has started to have "a little burning in her feet, not bad."  First diagnosed with breast cancer in September of 2019; had double mastectomy a month later.  Notes it runs in her family; mom, maternal grandmother both had breast cancer.  Patient's last mammogram was in 2017 because she was "too busy taking care of her mother," who died in 2017/04/08.  Notes she feels uncomfortable with her lack of breasts.  Says "it's only been a year so I still have a ways to  go."  - Psychiatry Follow-Up; MDD, GAD Confirms she has had depression and anxiety after her cancer treatment.  Has talked virtually with behavioral heath, but "I don't think we're going to be able to keep going."  States they tried two medications; tried Cymbalta (?) and generic Prozac.  States only took the Prozac for a week and started having side-effects.  Notes significant concerns about side effects and Stevens-Johnson syndrome.  Did obtain counseling through the cancer center; she likes her therapist.  Notes "we've only talked once or twice, and either the medicine's going to work or not."  Sees her counselor once every three weeks.  Says "we're okay" with this treatment plan.  Has not visited breast cancer support groups because "I'm an introvert."  - Insomnia Notes she's had several issues sleeping in the past.  - Female Health Follows up with [facility].  - Orthopedic Surgery Has had three MRI's and was diagnosed with arthritis.  Had one in February, then had a PET scan, and two more recent [place] to see ophthalmology in near future for concerns about glaucoma.  - Thyroid Health Has had left lobe of thyroid removed; had a growth/nodule which was benign.  Denies history of thyroid medication.  - Cardiac Health Notes has heart palpitations and was told she had mitral valve prolapse in her 20's.  Was told it was "really nothing to talk about" by a specialist in the past.  Has been told she has high cholesterol (managed on Crestor), and her blood pressure is variable (not managed on medications).   Wt Readings from Last 3 Encounters:  06/09/19 159 lb (72.1 kg)  04/29/19 157 lb 11.2 oz (71.5 kg)  04/09/19 156 lb (70.8 kg)   BP Readings from Last 3 Encounters:  06/09/19 137/88  04/29/19 (!) 144/96  11/07/18 (!) 125/95   Pulse Readings from Last 3 Encounters:  06/09/19 89  04/29/19 88  11/07/18 74   BMI Readings from Last 3 Encounters:   06/09/19 26.26 kg/m  04/29/19 25.45 kg/m  04/09/19 25.18 kg/m    Patient Care Team    Relationship Specialty Notifications Start End  Lawerance Cruel, MD PCP - General Family Medicine  06/06/18   Nicholas Lose, MD Consulting Physician Hematology and Oncology  05/20/18   Jovita Kussmaul, MD Consulting Physician General Surgery  05/20/18   Eppie Gibson, MD Attending Physician Radiation Oncology  05/20/18   Ursula Alert, MD Consulting Physician Psychiatry  06/09/19    Comment: Treats patient's mood disorder  Gardenia Phlegm, NP Nurse Practitioner Hematology and Oncology  06/09/19   Meredith Pel, MD Consulting Physician Orthopedic Surgery  06/09/19     Patient Active Problem List   Diagnosis Date Noted   Need for vaccination 06/09/2019   H/O left mastectomy 06/09/2019   MDD (major depressive disorder), recurrent episode, moderate ([place]) 05/06/2019   GAD (generalized anxiety disorder) 05/06/2019   Insomnia  due to mental condition 05/06/2019   Cancer of left female breast  ([place]) 06/13/2018   Malignant neoplasm of overlapping sites of left breast in female, estrogen receptor positive ([place]) 05/09/2018   Blurring of visual image of both eyes 12/06/2017   Postherpetic neuralgia 12/06/2017   Nasal turbinate hypertrophy 10/09/2016   Nasal congestion 08/31/2016   Deviated septum 08/31/2016   ESOPHAGEAL REFLUX 10/10/2009       As reported by pt:  Past Medical History:  Diagnosis Date   ADD (attention deficit disorder)    Anxiety    Cancer ([place])    breast cancer   Depression    GERD (gastroesophageal reflux disease)    Hiatal hernia    History of radiation therapy 08/13/2018- 09/25/2018   Left Chest wall and IM nodes/ 50 Gy in 25 fractions, Left supraclaicular fossa and PAB/ 50 Gy in 25 fractions, Left chest wall scar boost/ 10 Gy in 5 fractions.    Hyperlipemia, mixed    Intermittent palpitations    Malignant neoplasm of overlapping sites  of left breast in female, estrogen receptor positive ([place]) 05/06/2018   oncologist-  dr Lindi Adie--  Invasive Lobular Cancer, CIS, Stage IB, Grade 2 (pT3,pN1a,cM0), ER+---  s/p  left mastectomy with sln dissections and right mastectomy (benign)   PONV (postoperative nausea and vomiting)    Right thyroid nodule    Shingles 12/2017     Past Surgical History:  Procedure Laterality Date   AXILLARY LYMPH NODE DISSECTION Left 07/09/2018   Procedure: COMPLETION OF LEFT AXILLARY LYMPH NODE DISSECTION;  Surgeon: [name], MD;  Location: [facility];  Service: General;  Laterality: Left;   BREAST EXCISIONAL BIOPSY Left 1995   benign   CARDIOVASCULAR STRESS TEST  06/21/2017   normal nuclear stress study w/ no ischemia/  normal LV function and wall function, nuclear stress ef 70%   ELBOW SURGERY Left child   MASTECTOMY W/ SENTINEL NODE BIOPSY Left 06/13/2018   MASTECTOMY WITH RADIOACTIVE SEED GUIDED EXCISION AND AXILLARY SENTINEL LYMPH NODE BIOPSY Bilateral 06/13/2018   Procedure: LEFT MASTECTOMY WITH SENTINEL LYMPH NODE MAPPING AND TARGETED NODE DISSECTION AND RIGHT PROPHYLATIC MASTECTOMY;  Surgeon: Jovita Kussmaul, MD;  Location: [place];  Service: General;  Laterality: Bilateral;   THYROIDECTOMY Left 10-31-2001   dr gerkin @[facility]    TRANSTHORACIC ECHOCARDIOGRAM  06/21/2017   ef 60-65%/  trivial MR (no evidence mvp)   TUBAL LIGATION  yrs ago   VAGINAL HYSTERECTOMY  1990s     Family History  Problem Relation Age of Onset   Hypertension Mother    Heart attack Mother    Breast cancer Mother    Bipolar disorder Father    Breast cancer Maternal Grandmother    Breast cancer Cousin      Social History   Substance and Sexual Activity  Drug Use No     Social History   Substance and Sexual Activity  Alcohol Use Not Currently     Social History   Tobacco Use  Smoking Status Former Smoker   Packs/day: 0.25   Years: 0.00   Pack years: 0.00   Types: Cigarettes    Quit date: 12/05/2017   Years since quitting: 1.5  Smokeless Tobacco Never Used     Current Meds  Medication Sig   acetaminophen (TYLENOL) 500 MG tablet Take 1,000 mg by mouth at bedtime.    buPROPion (WELLBUTRIN XL) 150 MG 24 hr tablet Take 150 mg by mouth daily.   Cholecalciferol (VITAMIN D) 2000  units CAPS Take 2,000 Units by mouth daily.    denosumab (PROLIA) 60 MG/ML SOSY injection Inject 60 mg into the skin once.   diphenhydrAMINE (BENADRYL) 25 mg capsule Take 25 mg by mouth at bedtime.   folic acid (FOLVITE) A999333 MCG tablet Take 400 mcg by mouth daily.   LORazepam (ATIVAN) 0.5 MG tablet Take 1 tablet (0.5 mg total) by mouth every 8 (eight) hours as needed. for anxiety   Magnesium 400 MG TABS Take 400 mg by mouth.   omeprazole (PRILOSEC) 40 MG capsule Take 40 mg by mouth daily.   OVER THE COUNTER MEDICATION 2 tablets daily. Vegan Calcium 500   rosuvastatin (CRESTOR) 10 MG tablet Take 10 mg by mouth daily.   temazepam (RESTORIL) 30 MG capsule Take 30 mg by mouth at bedtime.    Allergies: Lamictal [lamotrigine], Cymbalta [duloxetine hcl], Hydrocodone, Mobic [meloxicam], Sulfa antibiotics, Brintellix [vortioxetine], Corticosteroids, Morphine, Other, Prednisone, Prozac [fluoxetine hcl], and Zoloft [sertraline]   Review of Systems  Constitutional: Negative for chills, diaphoresis, fever, malaise/fatigue and weight loss.  HENT: Negative for congestion, sore throat and tinnitus.   Eyes: Negative for blurred vision, double vision and photophobia.  Respiratory: Negative for cough and wheezing.   Cardiovascular: Negative for chest pain and palpitations.  Gastrointestinal: Negative for blood in stool, diarrhea, nausea and vomiting.  Genitourinary: Negative for dysuria, frequency and urgency.  Musculoskeletal: Negative for joint pain and myalgias.  Skin: Negative for itching and rash.  Neurological: Negative for dizziness, focal weakness, weakness and headaches.   Endo/Heme/Allergies: Negative for environmental allergies and polydipsia. Does not bruise/bleed easily.  Psychiatric/Behavioral: Negative for depression and memory loss. The patient is not nervous/anxious and does not have insomnia.         Objective:   Blood pressure 137/88, pulse 89, height 5' 5.25" (1.657 m), weight 159 lb (72.1 kg), SpO2 98 %. Body mass index is 26.26 kg/m. General: Well Developed, well nourished, and in no acute distress.  Neuro: Alert and oriented x3, extra-ocular muscles intact, sensation grossly intact.  HEENT:[place]/AT, PERRLA, neck supple, No carotid bruits Skin: no gross rashes  Cardiac: Regular rate and rhythm Respiratory: Essentially clear to auscultation bilaterally. Not using accessory muscles, speaking in full sentences.  Abdominal: not grossly distended Musculoskeletal: Ambulates w/o diff, FROM * 4 ext.  Vasc: less 2 sec cap RF, warm and pink  Psych:  No HI/SI, judgement and insight good, Euthymic mood. Full Affect.    Recent Results (from the past 2160 hour(s))  Vitamin D 25 hydroxy     Status: None   Collection Time: 04/29/19  9:46 AM  Result Value Ref Range   Vit D, 25-Hydroxy 39.2 30.0 - 100.0 ng/mL    Comment: (NOTE) Vitamin D deficiency has been defined by the [place] practice guideline as a level of serum 25-OH vitamin D less than 20 ng/mL (1,2). The Endocrine Society went on to further define vitamin D insufficiency as a level between 21 and 29 ng/mL (2). 1. IOM (Institute of Medicine). 2010. Dietary reference   intakes for calcium and D. [place]: The   [employer]. 2. Holick MF, Binkley Emelle, Bischoff-Ferrari HA, et al.   Evaluation, treatment, and prevention of vitamin D   deficiency: an Endocrine Society clinical practice   guideline. JCEM. 2011 Jul; 96(7):1911-30. Performed At: [facility] [place], [place] [id number] Rush Farmer MD [id number]    [place] [facility] only)     Status: Abnormal  Collection Time: 04/29/19  9:46 AM  Result Value Ref Range   Sodium 141 135 - 145 mmol/L   Potassium 4.0 3.5 - 5.1 mmol/L   Chloride 107 98 - 111 mmol/L   CO2 25 22 - 32 mmol/L   Glucose, Bld 108 (H) 70 - 99 mg/dL   BUN 20 8 - 23 mg/dL   Creatinine 0.82 0.44 - 1.00 mg/dL   Calcium 8.6 (L) 8.9 - 10.3 mg/dL   Total Protein 6.8 6.5 - 8.1 g/dL   Albumin 4.2 3.5 - 5.0 g/dL   AST 19 15 - 41 U/L   ALT 29 0 - 44 U/L   Alkaline Phosphatase 47 38 - 126 U/L   Total Bilirubin 0.5 0.3 - 1.2 mg/dL   GFR, Est Non Af Am >60 >60 mL/min   GFR, Est AFR Am >60 >60 mL/min   Anion gap 9 5 - 15    Comment: Performed at [facility], [address] [place], [place] [address]  Lipid panel     Status: None   Collection Time: 04/29/19  9:46 AM  Result Value Ref Range   Cholesterol 155 0 - 200 mg/dL   Triglycerides 76 <150 mg/dL   HDL 64 >40 mg/dL   Total CHOL/HDL Ratio 2.4 RATIO   VLDL 15 0 - 40 mg/dL   LDL Cholesterol 76 0 - 99 mg/dL    Comment:        Total Cholesterol/HDL:CHD Risk Coronary Heart Disease Risk Table                     Men   Women  1/2 Average Risk   3.4   3.3  Average Risk       5.0   4.4  2 X Average Risk   9.6   7.1  3 X Average Risk  23.4   11.0        Use the calculated Patient Ratio above and the CHD Risk Table to determine the patient's CHD Risk.        ATP III CLASSIFICATION (LDL):  <100     mg/dL   Optimal  100-129  mg/dL   Near or Above                    Optimal  130-159  mg/dL   Borderline  160-189  mg/dL   High  >190     mg/dL   Very High Performed at [facility] [address] [place],  [address]

## 2019-06-09 NOTE — Telephone Encounter (Signed)
Ok

## 2019-06-09 NOTE — Telephone Encounter (Signed)
pt called states she seen a new pcp and he would like for you to handle her psychiatric medications. she states that she has enough right now.  she was told that when she has about a weeks worth of medication to call for refill.  pt was also given the phone number to [facility] so they can deliver and go over her insurance.

## 2019-06-09 NOTE — Patient Instructions (Addendum)
Please call psychiatry about genetic testing to see which mood medicine would be best for you.  Also make follow-up appointment to discuss those results with psychiatrist.    Please realize, EXERCISE IS MEDICINE!  -  American Heart Association [facility]) guidelines for exercise : If you are in good health, without any medical conditions, you should engage in 150-300 minutes of moderate intensity aerobic activity per week.  This means you should be huffing and puffing throughout your workout.   Engaging in regular exercise will improve brain function and memory, as well as improve mood, boost immune system and help with weight management.  As well as the other, more well-known effects of exercise such as decreasing blood sugar levels, decreasing blood pressure,  and decreasing bad cholesterol levels/ increasing good cholesterol levels.     -  The AHA strongly endorses consumption of a diet that contains a variety of foods from all the food categories with an emphasis on fruits and vegetables; fat-free and low-fat dairy products; cereal and grain products; legumes and nuts; and fish, poultry, and/or extra lean meats.    Excessive food intake, especially of foods high in saturated and trans fats, sugar, and salt, should be avoided.    Adequate water intake of roughly 1/2 of your weight in pounds, should equal the ounces of water per day you should drink.  So for instance, if you're 200 pounds, that would be 100 ounces of water per day.         Mediterranean Diet  Why follow it? Research shows  Those who follow the Mediterranean diet have a reduced risk of heart disease   The diet is associated with a reduced incidence of Parkinson's and Alzheimer's diseases  People following the diet may have longer life expectancies and lower rates of chronic diseases   The Dietary Guidelines for Americans recommends the Mediterranean diet as an eating plan to promote health and prevent disease  What Is the  Mediterranean Diet?   Healthy eating plan based on typical foods and recipes of Mediterranean-style cooking  The diet is primarily a plant based diet; these foods should make up a majority of meals   Starches - Plant based foods should make up a majority of meals - They are an important sources of vitamins, minerals, energy, antioxidants, and fiber - Choose whole grains, foods high in fiber and minimally processed items  - Typical grain sources include wheat, oats, barley, corn, brown rice, bulgar, farro, millet, polenta, couscous  - Various types of beans include chickpeas, lentils, fava beans, black beans, white beans   Fruits  Veggies - Large quantities of antioxidant rich fruits & veggies; 6 or more servings  - Vegetables can be eaten raw or lightly drizzled with oil and cooked  - Vegetables common to the traditional Mediterranean Diet include: artichokes, arugula, beets, broccoli, brussel sprouts, cabbage, carrots, celery, collard greens, cucumbers, eggplant, kale, leeks, lemons, lettuce, mushrooms, okra, onions, peas, peppers, potatoes, pumpkin, radishes, rutabaga, shallots, spinach, sweet potatoes, turnips, zucchini - Fruits common to the Mediterranean Diet include: apples, apricots, avocados, cherries, clementines, dates, figs, grapefruits, grapes, melons, nectarines, oranges, peaches, pears, pomegranates, strawberries, tangerines  Fats - Replace butter and margarine with healthy oils, such as olive oil, canola oil, and tahini  - Limit nuts to no more than a handful a day  - Nuts include walnuts, almonds, pecans, pistachios, pine nuts  - Limit or avoid candied, honey roasted or heavily salted nuts - Olives are central to the  Mediterranean diet - can be eaten whole or used in a variety of dishes   Meats Protein - Limiting red meat: no more than a few times a month - When eating red meat: choose lean cuts and keep the portion to the size of deck of cards - Eggs: approx. 0 to 4 times a  week  - Fish and lean poultry: at least 2 a week  - Healthy protein sources include, chicken, [place], lean beef, lamb - Increase intake of seafood such as tuna, salmon, trout, mackerel, shrimp, scallops - Avoid or limit high fat processed meats such as sausage and bacon  Dairy - Include moderate amounts of low fat dairy products  - Focus on healthy dairy such as fat free yogurt, skim milk, low or reduced fat cheese - Limit dairy products higher in fat such as whole or 2% milk, cheese, ice cream  Alcohol - Moderate amounts of red wine is ok  - No more than 5 oz daily for women (all ages) and men older than age 14  - No more than 10 oz of wine daily for men younger than 68  Other - Limit sweets and other desserts  - Use herbs and spices instead of salt to flavor foods  - Herbs and spices common to the traditional Mediterranean Diet include: basil, bay leaves, chives, cloves, cumin, fennel, garlic, lavender, marjoram, mint, oregano, parsley, pepper, rosemary, sage, savory, sumac, tarragon, thyme   Its not just a diet, its a lifestyle:   The Mediterranean diet includes lifestyle factors typical of those in the region   Foods, drinks and meals are best eaten with others and savored  Daily physical activity is important for overall good health  This could be strenuous exercise like running and aerobics  This could also be more leisurely activities such as walking, housework, yard-work, or taking the stairs  Moderation is the key; a balanced and healthy diet accommodates most foods and drinks  Consider portion sizes and frequency of consumption of certain foods   Meal Ideas & Options:   Breakfast:  o Whole wheat toast or whole wheat English muffins with peanut butter & hard boiled egg o Steel cut oats topped with apples & cinnamon and skim milk  o Fresh fruit: banana, strawberries, melon, berries, peaches  o Smoothies: strawberries, bananas, greek yogurt, peanut butter o Low fat  greek yogurt with blueberries and granola  o Egg white omelet with spinach and mushrooms o Breakfast couscous: whole wheat couscous, apricots, skim milk, cranberries   Sandwiches:  o Hummus and grilled vegetables (peppers, zucchini, squash) on whole wheat bread   o Grilled chicken on whole wheat pita with lettuce, tomatoes, cucumbers or tzatziki  o Tuna salad on whole wheat bread: tuna salad made with greek yogurt, olives, red peppers, capers, green onions o Garlic rosemary lamb pita: lamb sauted with garlic, rosemary, salt & pepper; add lettuce, cucumber, greek yogurt to pita - flavor with lemon juice and black pepper   Seafood:  o Mediterranean grilled salmon, seasoned with garlic, basil, parsley, lemon juice and black pepper o Shrimp, lemon, and spinach whole-grain pasta salad made with low fat greek yogurt  o Seared scallops with lemon orzo  o Seared tuna steaks seasoned salt, pepper, coriander topped with tomato mixture of olives, tomatoes, olive oil, minced garlic, parsley, green onions and cappers   Meats:  o Herbed greek chicken salad with kalamata olives, cucumber, feta  o Red bell peppers stuffed with spinach, bulgur, lean ground  beef (or lentils) & topped with feta   o Kebabs: skewers of chicken, tomatoes, onions, zucchini, squash  o [place] burgers: made with red onions, mint, dill, lemon juice, feta cheese topped with roasted red peppers  Vegetarian o Cucumber salad: cucumbers, artichoke hearts, celery, red onion, feta cheese, tossed in olive oil & lemon juice  o Hummus and whole grain pita points with a greek salad (lettuce, tomato, feta, olives, cucumbers, red onion) o Lentil soup with celery, carrots made with vegetable broth, garlic, salt and pepper  o Tabouli salad: parsley, bulgur, mint, scallions, cucumbers, tomato, radishes, lemon juice, olive oil, salt and pepper.      What is Chronic Stress Syndrome, Symptoms & Ways to Deal With it   What is Chronic Stress  Syndrome?  Chronic Stress Syndrome is something which can now be called as a medical condition due to the amount of stress an individual is going through these days. Chronic Stress Syndrome causes the body and mind to shutdown and the person has no control over himself or herself. Due to the demands of modern day life and the hardship throughout day and night takes its toll over a period of time and the body and brain starts demanding rest and a break. This leads to certain symptoms where your performance level starts to dip at work, you become irritable both at work and at home, you may stop enjoying activities you previously liked, you may become depressed, you may get angry for even small things. Chronic Stress Syndrome can significantly impact your quality life. Thus it is important understand the symptoms of Chronic Stress Syndrome and react accordingly in order to cope up with it.  It is important to note here that a balanced work-home equation should be drawn to cut down symptoms of Chronic Stress Syndrome. Minor stressors can be overcome by the bodys inbuilt stress response but when there is unending stress for a long period of time then an external help is required to ease the stress.  Chronic Stress Syndrome can physically and psychologically drain you over a period of time. For such cases stress management is the best way to cope up with Chronic Stress Syndrome. If Chronic Stress Syndrome is not treated then it may result in many health hazards like anxiety, muscle pain, insomnia, and high blood pressure along with a compromised immune system leading to frequent infections and missed days from work.    What are the Symptoms of Chronic Stress Syndrome?   The symptoms of Chronic Stress Syndrome are variable and range from generalized symptoms to emotional symptoms along with behavioral and cognitive symptoms. Some of these symptoms have been delineated below:  Generalized Symptoms of Chronic  Stress Syndrome are: Anxiety Depression Social isolation Headache Abdominal pain Lack of sleep Back pain Difficulty in concentrating Hypertension Hemorrhoids Varicose veins Panic attacks/ Panic disorder Cardiovascular diseases.   Some of the Emotional Symptoms of Chronic Stress Syndrome are: To become easily agitated, moody and frustrated Feeling overwhelmed which makes you feel like you are losing control. Having difficulty relaxing and have a peaceful mind Having low self esteem Feeling lonely Feeling worthless Feeling depressed Avoiding social environment.   Some of the Physical Symptoms of Chronic Stress Syndrome are: Headaches Lethargy Alternating diarrhea and constipation Nausea Muscles aches and pains Insomnia Rapid heartbeat and chest pain Infections and frequent colds Decreased libido Nervousness and shaking Tinnitus Sweaty palms Dry mouth Clenched jaw.  Some of the Cognitive Symptoms of Chronic Stress Syndrome are: Constant worrying  Racing thoughts Disorganization and forgetfulness Inability to focus Poor judgment Abundance of negativity.  Some of the Behavioral Symptoms of Chronic Stress Syndrome are: Changes in appetite with less desire to eat Avoiding responsibilities Indulgence in alcohol or recreational drug use Increased nail biting and being fidgety Ways to Deal With Chronic Stress Syndrome    Chronic Stress Syndrome is not something which cannot be addressed. A bit of effort from your side in the form of lifestyle modifications, a little bit of exercise, a balanced work life equation can do wonders and help you get rid of Chronic Stress Syndrome.  Get Proper Sleep: It has been proved that Chronic Stress Syndrome causes loss of sleep where an individual may not even be able to sleep for days unending. This may result in the individual feeling lethargic and unable to focus at work the following morning. This may lead to decreased  performance at work. Thus, it is important to have a good sleep-wake cycle. For this, try and not drink any caffeinated beverage about four hours prior to going to sleep, as caffeine pumps up the adrenaline and causes you to stay awake resulting ultimately in Chronic Stress Syndrome.  Avoid Alcohol and Drugs: Another way to get rid of Chronic Stress Syndrome is lifestyle modifications. Stay away from alcohol and other recreational drugs. Take Short Frequent Breaks at Work: Try to take frequent breaks from work and do not work continuously. Try and manage your work in such a way that you even meet your deadline and come home on time for a happy dinner with family. A good time spent with family and kids does wonders in not only dealing with Chronic Stress Syndrome but also preventing it.  Become Physically Active: Another step towards getting rid of Chronic Stress Syndrome is physical activity. If you do not have time to spend at the gym then at least try and go for daily walks for about half an hour a day which not only keeps the stress away but also is good for your overall health. Physical activity leads to production of endorphins which will make you feel relaxed and feel good.  Healthy Diet Can Help You Deal With Chronic Stress Syndrome: Have a balanced and healthy diet is another step towards a stress free life and keeping Chronic Stress Syndrome at [place]. If time is a constraint then you can try eating three small meals a day. Try and avoid fast foods and take foods which are healthy and rich in proteins, fiber, and carbohydrates to boost your energy system.  Music Can Soothe Your Mind: Light music is one of the best and most effective relaxation techniques that one can try to overcome stress. It has shown to calm down the mind and take you away from all the stressors that you may be having. These days it is also being used as a therapy in some institutes for overcoming stress. It is important here to  discuss the importance of a good social support system for patients with Chronic Stress Syndrome, as a good social support framework can do wonders in taking the stress away from the patient and overcoming Chronic Stress Syndrome.  Meditation Can Help You Deal With Chronic Stress Syndrome Effectively: Meditation and yoga has also shown to be quite effective in relaxing the mind and coping up with Chronic Stress Syndrome   In cases where these measures are not helpful, then it is time for you to consult with a skilled psychologist or a psychiatrist for  potential therapies or medications to control the stress response.   The psychologist can help you with a variety of steps for coping up with Chronic Stress Syndrome. Relaxation techniques and behavioral therapy are some of the methods employed by psychologists. In some cases, medications can also be given to help relax the patient.  Since Chronic Stress Syndrome is both emotionally and physically draining for the patient and it also adversely affects the family life of the patient hence it is important for the patient to recognize the condition and taking steps to cope up with it. Escaping measures like alcohol and drug use are of no help as they only aggravate the condition apart from their other health hazards. If this condition is ignored or left untreated it can lead to various medical conditions like anxiety and depression and various other medical conditions.  Last but not least, smile as often as you can as it is the best gift that you can give to someone. The best way to stay relaxed is to have a good smile, exercise daily, spend time with your family, meditation and if required consultation with a good psychologist so that you can live a stress free life and overcome the symptoms of Chronic Stress Syndrome.      What causes depression?  You may already know that depression (MDD) is a complex medical condition. The exact cause is unknown, but  it's most likely a combination of several factors: genetic, biological, environmental, and psychological.  Depression can run in families, but not everyone with depression has a family history. Scientists are trying to identify genes involved to understand the role that genetics may play in depression.  Environmental factors that may play a role in depression include trauma, loss of a loved one, a difficult relationship, or other stressful situations. Some depressive episodes may occur without an obvious trigger.  If you think you may have depression, there is something you can do about it. The first step is to talk to your healthcare professional about your symptoms.  You can also read about the symptoms of depression below to help you get a better understanding about the types of changes you may be experiencing.    Depression and the brain:  It's thought that certain neurotransmitters such as serotonin, norepinephrine, and dopamine (chemicals that the brain uses to communicate) are out of balance when you're depressed  Serotonin is one of the chemicals believed to be affected by depression - it is thought to be involved in the regulation of mood and other functions    What are the symptoms of depression?  Emotional. Physical. Cognitive.  1. Little interest or pleasure in doing things 2. Feeling down, depressed, or hopeless 3. Trouble falling or staying asleep, or sleeping too much 4. Feeling tired or having little energy 5. Poor appetite, overeating, or considerable weight changes 6. Feeling bad about yourself - that you are a failure or having a lot of guilt 7. Difficulty concentrating on things or making decisions 8. Moving or speaking slowly, so that other people have noticed, or being so restless that you've been moving around a lot 9. Thoughts that you would be better off dead, or of hurting yourself in some way   Depression is a complex medical condition that may make it  hard to feel like yourself. Talk to your healthcare professional about all of your symptoms and their impact.   You may be suffering from depression if you are experiencing five or more of the  symptoms above including either depressed mood or decreased interest or pleasure. In addition, depression is also associated with:   Symptoms that are new or noticeably worse compared to what they were prior to the episode  Symptoms that persist for most of the day, nearly every day for at least two consecutive weeks  Episodes that are accompanied by clinically significant distress or impaired functioning    Myth:  Depression is something you can just "snap out of".   Fact:  No one chooses to be depressed, and it is not caused by laziness, weakness, or simply feeling sad. Depression is a complex medical condition. The exact cause is unknown, but it's most likely a combination of several factors: genetic, biological, environmental, and psychological. You cannot control your loved ones recovery, but you can support them along the way.   Myth:  Depression only affects your emotions  Fact:  Depression can be emotional, physical, and cognitive symptoms. It can result in symptoms that include little interest or pleasure in doing things; feeling down, depressed, or hopeless; trouble falling or staying asleep, or sleeping too much; feeling tired or having little energy; poor appetite, overeating, or considerable weight changes; feeling bad about yourself, like you are a failure or having a lot of guilt; trouble concentrating on things or making decisions; moving or speaking slowly, so that other people have noticed, or being so restless that you've been moving around a lot; and thoughts that you would be better off dead, or of hurting yourself in some way. People with depression do not all experience the same symptoms, and the severity, frequency, and duration can vary

## 2019-06-10 ENCOUNTER — Encounter: Payer: Self-pay | Admitting: Family Medicine

## 2019-06-10 ENCOUNTER — Ambulatory Visit: Payer: 59 | Attending: General Surgery

## 2019-06-10 DIAGNOSIS — Z83511 Family history of glaucoma: Secondary | ICD-10-CM | POA: Diagnosis not present

## 2019-06-10 DIAGNOSIS — Z170 Estrogen receptor positive status [ER+]: Secondary | ICD-10-CM | POA: Diagnosis not present

## 2019-06-10 DIAGNOSIS — H43812 Vitreous degeneration, left eye: Secondary | ICD-10-CM | POA: Diagnosis not present

## 2019-06-10 DIAGNOSIS — I972 Postmastectomy lymphedema syndrome: Secondary | ICD-10-CM | POA: Diagnosis not present

## 2019-06-10 DIAGNOSIS — H2513 Age-related nuclear cataract, bilateral: Secondary | ICD-10-CM | POA: Diagnosis not present

## 2019-06-10 DIAGNOSIS — R293 Abnormal posture: Secondary | ICD-10-CM | POA: Diagnosis not present

## 2019-06-10 DIAGNOSIS — M6281 Muscle weakness (generalized): Secondary | ICD-10-CM | POA: Diagnosis not present

## 2019-06-10 DIAGNOSIS — C50812 Malignant neoplasm of overlapping sites of left female breast: Secondary | ICD-10-CM | POA: Diagnosis not present

## 2019-06-10 DIAGNOSIS — M25612 Stiffness of left shoulder, not elsewhere classified: Secondary | ICD-10-CM | POA: Insufficient documentation

## 2019-06-10 DIAGNOSIS — H40013 Open angle with borderline findings, low risk, bilateral: Secondary | ICD-10-CM | POA: Diagnosis not present

## 2019-06-10 NOTE — Therapy (Signed)
[place], [place], [address] Phone: [phone]   Fax:  [phone]  Physical Therapy Treatment  Patient Details  Name: Dawn Booker MRN: [id number] Date of Birth: 23-Jul-1956 Referring Provider (PT): Dr.Gudena    Encounter Date: 06/10/2019  PT End of Session - 06/10/19 1458    Visit Number  3    Number of Visits  5    Date for PT Re-Evaluation  06/25/19    PT Start Time  0254    PT Stop Time  1453    PT Time Calculation (min)  38 min    Activity Tolerance  Patient tolerated treatment well    Behavior During Therapy  [facility] for tasks assessed/performed       Past Medical History:  Diagnosis Date  . ADD (attention deficit disorder)   . Anxiety   . Cancer [facility])    breast cancer  . Depression   . GERD (gastroesophageal reflux disease)   . Hiatal hernia   . History of radiation therapy 08/13/2018- 09/25/2018   Left Chest wall and IM nodes/ 50 Gy in 25 fractions, Left supraclaicular fossa and PAB/ 50 Gy in 25 fractions, Left chest wall scar boost/ 10 Gy in 5 fractions.   . Hyperlipemia, mixed   . Intermittent palpitations   . Malignant neoplasm of overlapping sites of left breast in female, estrogen receptor positive ([place]) 05/06/2018   oncologist-  dr Lindi Adie--  Invasive Lobular Cancer, CIS, Stage IB, Grade 2 (pT3,pN1a,cM0), ER+---  s/p  left mastectomy with sln dissections and right mastectomy (benign)  . PONV (postoperative nausea and vomiting)   . Right thyroid nodule   . Shingles 12/2017    Past Surgical History:  Procedure Laterality Date  . AXILLARY LYMPH NODE DISSECTION Left 07/09/2018   Procedure: COMPLETION OF LEFT AXILLARY LYMPH NODE DISSECTION;  Surgeon: Jovita Kussmaul, MD;  Location: [facility];  Service: General;  Laterality: Left;  . BREAST EXCISIONAL BIOPSY Left 1995   benign  . CARDIOVASCULAR STRESS TEST  06/21/2017   normal nuclear stress study w/ no ischemia/  normal LV function  and wall function, nuclear stress ef 70%  . ELBOW SURGERY Left child  . MASTECTOMY W/ SENTINEL NODE BIOPSY Left 06/13/2018  . MASTECTOMY WITH RADIOACTIVE SEED GUIDED EXCISION AND AXILLARY SENTINEL LYMPH NODE BIOPSY Bilateral 06/13/2018   Procedure: LEFT MASTECTOMY WITH SENTINEL LYMPH NODE MAPPING AND TARGETED NODE DISSECTION AND RIGHT PROPHYLATIC MASTECTOMY;  Surgeon: Jovita Kussmaul, MD;  Location: [facility];  Service: General;  Laterality: Bilateral;  . THYROIDECTOMY Left 10-31-2001   dr gerkin _0   . TRANSTHORACIC ECHOCARDIOGRAM  06/21/2017   ef 60-65%/  trivial MR (no evidence mvp)  . TUBAL LIGATION  yrs ago  . VAGINAL HYSTERECTOMY  1990s    There were no vitals filed for this visit.  Subjective Assessment - 06/10/19 1419    Subjective  Pt states that she used her pump since the adjustment last week and it worked so much better reporting that it left some indentations in her arm and her hand looked much better. Pt states that she has been experiencing some pain in her L lower quadrant; Derek from tactile told her not to use the pump until she was able to talk with me or her PCP. No indications that the pump should be causing pain in her L lower quadrant. Discussed trying the pump and if any increase in pain to stop use until she speaks to her PCP  in 2 weeks. Pt states that she just saw her PCP but forgot to ask so she will go back in 2 weeks. Pt has not been wearing compression sleeve at this time or doing exercises for fear of aggravating the pain in her L lower quadrant.    Pertinent History  Patient was diagnosed on 04/25/18 with left grade II invasive lobular carci1noma breast cancer. There are 2 areas: 2.6 cm in the upper outer quadrant and 6 mm in the upper inner quadrant. Both are ER/PR positive and HER2 negative with a Ki67 of 10% and she has a positive axillary node. Bilateral mastectomy on 06/13/2018 wiht 7 nodes removed and the 10 in second surgery on 07/09/2018  She had radiation and did  well.    Patient Stated Goals  to be able to get range of motion back in her arm    Currently in Pain?  Yes    Pain Score  4     Pain Location  Axilla    Pain Orientation  Left    Pain Descriptors / Indicators  Tightness    Pain Type  Chronic pain    Pain Onset  More than a month ago    Pain Frequency  Constant    Aggravating Factors   sudden movements    Pain Relieving Factors  medication if needed    Multiple Pain Sites  No                       [facility] Adult PT Treatment/Exercise - 06/10/19 0001      Exercises   Exercises  Shoulder    Other Exercises   Lymphedema excercises going through the entire sequencing for 5 repetitions for each movement B. Cervical flexion/extension, Cervical side bending R/L, Cervical rotation R/L, shoulder shrugs, scapular retraction, Shoulder flexion, trunk side bends R/L (less to the L due to pain in LLQ), Elbow flexion/extension, wrist flexion/extension, hand pumps with shoulder flexion B, Pt performed 5 breathes diaphragmatic breathing with superficial stimulation due to pt has pain in her LLE discussed avoiding pain as much as possible at this time.              PT Education - 06/10/19 1448    Education Details  Access Code: [email or web address], Pt was educated to not perform deep breathing with pressure unless it was not painful and that just diaphragmatic breathing is okay at this time. Discussed discontinuing anything that increases LLQ pain at this time. She will try the arm pump again and assess if she has an increase in pain. Pt will call her Md when she leaves to see if she can get a sooner appt.    Person(s) Educated  Patient    Methods  Explanation;Demonstration;Handout;Verbal cues    Comprehension  Verbalized understanding;Returned demonstration       PT Short Term Goals - 05/21/19 1710      PT SHORT TERM GOAL #4   Title  Pt will have reduction in LUE circumferential measurements by 0.5 cm or greater within 2 weeks in order to  demonstrate improved utilization of garment and pump.    Time  2    Period  Weeks    Status  New    Target Date  06/11/19        PT Long Term Goals - 05/21/19 1709      PT LONG TERM GOAL #3   Title  Pt will report she is able to manage her symptoms  of lymphedema on her own at home    Time  4    Period  Weeks    Status  On-going    Target Date  06/25/19            Plan - 06/10/19 1432    Clinical Impression Statement  Pt presents to physical therapy this session without any increasein edema from last session but no increase in edema in the LUE. She has received her compression garment for the trunk but it is slightly too big in the chest area due to she ordered one size bigger than recommended at the last session. She states that she is going to order the recommended size. She continues with soft non-pitting edema in her LUQ. Pt was educated on lymphatic stimulating exercises this session and discussed working from 5 repetitions up to 20 repetitions. She was educated on diaphragmatic breathing; due to pain in the LLQ discussed avoiding that pain as much as possible until she is able to see MD in order to prevent aggravating any unknown issues. Pt was educated to try using the flexitouch pump and then immediately donning compression or donning compression first thing in the morning to see if this does not help with the edema in her L hand. Pt will benefit from continued POC.    Personal Factors and Comorbidities  Comorbidity 2    Comorbidities  2 surgeries for breast cancer, radiation    Rehab Potential  Good    Clinical Impairments Affecting Rehab Potential  17 nodes removed with second surgery needed for lymph node dissection , radiation     PT Frequency  1x / week    PT Duration  4 weeks    PT Treatment/Interventions  ADLs/Self Care Home Management;DME Instruction;Therapeutic activities;Therapeutic exercise;Orthotic Fit/Training;Patient/family education;Manual  techniques;Neuromuscular re-education;Manual lymph drainage;Compression bandaging;Scar mobilization;Taping;Passive range of motion    PT Next Visit Plan  Assess MLD, re-measure ROM/circumferential measurements, discuss foam placement in the compression tank top. Assess wearing sleeve after compression pump usage. Discharge if appropriate.    PT Home Exercise Plan  Access Code: [email or web address]    Consulted and Agree with Plan of Care  Patient       Patient will benefit from skilled therapeutic intervention in order to improve the following deficits and impairments:  Decreased knowledge of precautions, Pain, Impaired UE functional use, Decreased range of motion, Postural dysfunction, Decreased skin integrity, Increased fascial restricitons, Decreased scar mobility, Impaired perceived functional ability, Decreased strength, Decreased activity tolerance, Increased edema  Visit Diagnosis: Stiffness of left shoulder joint  Abnormal posture  Muscle weakness (generalized)  Postmastectomy lymphedema  Malignant neoplasm of overlapping sites of left breast in female, estrogen receptor positive ([place])     Problem List Patient Active Problem List   Diagnosis Date Noted  . Need for vaccination 06/09/2019  . H/O left mastectomy 06/09/2019  . MDD (major depressive disorder), recurrent episode, moderate ([place]) 05/06/2019  . GAD (generalized anxiety disorder) 05/06/2019  . Insomnia due to mental condition 05/06/2019  . Cancer of left female breast  ([place]) 06/13/2018  . Malignant neoplasm of overlapping sites of left breast in female, estrogen receptor positive ([place]) 05/09/2018  . Blurring of visual image of both eyes 12/06/2017  . Postherpetic neuralgia 12/06/2017  . Nasal turbinate hypertrophy 10/09/2016  . Nasal congestion 08/31/2016  . Deviated septum 08/31/2016  . ESOPHAGEAL REFLUX 10/10/2009    Ander Purpura, PT 06/10/2019, 3:05 PM  [facility]  [place], [place], [address] Phone: [phone]   Fax:  [phone]  Name: Dawn Booker MRN: [id number] Date of Birth: 10-16-1955

## 2019-06-10 NOTE — Patient Instructions (Signed)
Access Code: [email or web address]  URL: [email or web address]  Date: 06/10/2019  Prepared by: Tomma Rakers   Exercises Standing Upper Cervical Flexion and Extension - 20 reps - 1 sets - 1x daily - 7x weekly Standing Cervical Sidebending AROM - 20 reps - 1 sets - 1x daily - 7x weekly Standing Cervical Rotation AROM - 20 reps - 1 sets - 1x daily - 7x weekly Standing Shoulder Flexion Full Range - 20 reps - 1 sets - 1x daily - 7x weekly Standing Shoulder Shrugs - 20 reps - 1 sets - 1x daily - 7x weekly Standing Scapular Retraction - 20 reps - 1 sets - 1x daily - 7x weekly Trunk Sidebending with Compression Garment - 20 reps - 1 sets - 1x daily - 7x weekly Standing Hip Extension with Counter Support - 20 reps - 1 sets - 1x daily - 7x weekly Elbow AROM Flexion & Extension Supinated Forearm - 20 reps - 1 sets - 1x daily - 7x weekly Wrist AROM Flexion Extension - 20 reps - 1 sets - 1x daily - 7x weekly Hand Fist Pumps - 20 reps - 1 sets - 1x daily - 7x weekly

## 2019-06-15 ENCOUNTER — Other Ambulatory Visit: Payer: Self-pay | Admitting: Family Medicine

## 2019-06-15 DIAGNOSIS — R1032 Left lower quadrant pain: Secondary | ICD-10-CM | POA: Diagnosis not present

## 2019-06-15 NOTE — Telephone Encounter (Signed)
Patient called states she has only 1 week worth of :  omeprazole (PRILOSEC) 40 MG capsule [id number]   Order Details Dose: 40 mg Route: Oral Frequency: Daily  Dispense Quantity: -- Refills: 4 Fills remaining: --        Sig: Take 40 mg by mouth daily.     -- patient also states provider mentioned that Dr. Jenetta Downer says she will prescribe & is waiting on that Rx as well.  --Forwarding request to medical asst that if approved please send to :   Preferred Pharmacies      [place], [place] - [place] [phone] (Phone) [phone] (Fax)    --Dion Body

## 2019-06-15 NOTE — Telephone Encounter (Signed)
Pt is also requesting RX for Vitamin D.  We have not prescribed these medications for the patient previously.  Please review and refill if appropriate.  Charyl Bigger, CMA

## 2019-06-16 ENCOUNTER — Ambulatory Visit: Payer: 59

## 2019-06-16 ENCOUNTER — Other Ambulatory Visit: Payer: Self-pay

## 2019-06-16 ENCOUNTER — Telehealth: Payer: Self-pay

## 2019-06-16 DIAGNOSIS — C50812 Malignant neoplasm of overlapping sites of left female breast: Secondary | ICD-10-CM

## 2019-06-16 DIAGNOSIS — R293 Abnormal posture: Secondary | ICD-10-CM | POA: Diagnosis not present

## 2019-06-16 DIAGNOSIS — M25612 Stiffness of left shoulder, not elsewhere classified: Secondary | ICD-10-CM

## 2019-06-16 DIAGNOSIS — I972 Postmastectomy lymphedema syndrome: Secondary | ICD-10-CM | POA: Diagnosis not present

## 2019-06-16 DIAGNOSIS — M6281 Muscle weakness (generalized): Secondary | ICD-10-CM | POA: Diagnosis not present

## 2019-06-16 DIAGNOSIS — Z170 Estrogen receptor positive status [ER+]: Secondary | ICD-10-CM

## 2019-06-16 MED ORDER — OMEPRAZOLE 40 MG PO CPDR: 40.0000 mg | DELAYED_RELEASE_CAPSULE | Freq: Every day | ORAL | 0 refills | Status: DC

## 2019-06-16 MED FILL — OMEPRAZOLE 40 MG CPDR: 40 | 90 days supply | Qty: 90 | Fill #0

## 2019-06-16 NOTE — Therapy (Signed)
[place], [place], [address] Phone: [phone]   Fax:  [phone]  Physical Therapy Discharge  Patient Details  Name: Dawn Booker MRN: [id number] Date of Birth: 1956/01/02 Referring Provider (PT): Dr.Gudena    Encounter Date: 06/16/2019  PT End of Session - 06/16/19 1413    Visit Number  4    Number of Visits  5    Date for PT Re-Evaluation  06/25/19    PT Start Time  1300    PT Stop Time  1355    PT Time Calculation (min)  55 min    Activity Tolerance  Patient tolerated treatment well    Behavior During Therapy  [facility] for tasks assessed/performed       Past Medical History:  Diagnosis Date  . ADD (attention deficit disorder)   . Anxiety   . Cancer [facility])    breast cancer  . Depression   . GERD (gastroesophageal reflux disease)   . Hiatal hernia   . History of radiation therapy 08/13/2018- 09/25/2018   Left Chest wall and IM nodes/ 50 Gy in 25 fractions, Left supraclaicular fossa and PAB/ 50 Gy in 25 fractions, Left chest wall scar boost/ 10 Gy in 5 fractions.   . Hyperlipemia, mixed   . Intermittent palpitations   . Malignant neoplasm of overlapping sites of left breast in female, estrogen receptor positive ([place]) 05/06/2018   oncologist-  dr Lindi Adie--  Invasive Lobular Cancer, CIS, Stage IB, Grade 2 (pT3,pN1a,cM0), ER+---  s/p  left mastectomy with sln dissections and right mastectomy (benign)  . PONV (postoperative nausea and vomiting)   . Right thyroid nodule   . Shingles 12/2017    Past Surgical History:  Procedure Laterality Date  . AXILLARY LYMPH NODE DISSECTION Left 07/09/2018   Procedure: COMPLETION OF LEFT AXILLARY LYMPH NODE DISSECTION;  Surgeon: Jovita Kussmaul, MD;  [facility];  Service: General;  Laterality: Left;  . BREAST EXCISIONAL BIOPSY Left 1995   benign  . CARDIOVASCULAR STRESS TEST  06/21/2017   normal nuclear stress study w/ no ischemia/  normal LV function  and wall function, nuclear stress ef 70%  . ELBOW SURGERY Left child  . MASTECTOMY W/ SENTINEL NODE BIOPSY Left 06/13/2018  . MASTECTOMY WITH RADIOACTIVE SEED GUIDED EXCISION AND AXILLARY SENTINEL LYMPH NODE BIOPSY Bilateral 06/13/2018   Procedure: LEFT MASTECTOMY WITH SENTINEL LYMPH NODE MAPPING AND TARGETED NODE DISSECTION AND RIGHT PROPHYLATIC MASTECTOMY;  Surgeon: Jovita Kussmaul, MD;  Location: [facility];  Service: General;  Laterality: Bilateral;  . THYROIDECTOMY Left 10-31-2001   dr gerkin '@[facility]'   . TRANSTHORACIC ECHOCARDIOGRAM  06/21/2017   ef 60-65%/  trivial MR (no evidence mvp)  . TUBAL LIGATION  yrs ago  . VAGINAL HYSTERECTOMY  1990s    There were no vitals filed for this visit.  Subjective Assessment - 06/16/19 1302    Subjective  Pt reports that she has been using her pump consistently at home and then putting her sleeve on first thing in the morning with her hand gauntlet.    Pertinent History  Patient was diagnosed on 04/25/18 with left grade II invasive lobular carci1noma breast cancer. There are 2 areas: 2.6 cm in the upper outer quadrant and 6 mm in the upper inner quadrant. Both are ER/PR positive and HER2 negative with a Ki67 of 10% and she has a positive axillary node. Bilateral mastectomy on 06/13/2018 wiht 7 nodes removed and the 10 in second surgery on 07/09/2018  She had radiation and did well.    Patient Stated Goals  to be able to get range of motion back in her arm    Pain Score  4     Pain Location  Hand    Pain Orientation  Left    Pain Descriptors / Indicators  Tightness    Pain Type  Chronic pain    Pain Onset  More than a month ago    Pain Frequency  Constant    Aggravating Factors   increased swelling            LYMPHEDEMA/ONCOLOGY QUESTIONNAIRE - 06/16/19 1314      Left Upper Extremity Lymphedema   15 cm Proximal to Olecranon Process  31.4 cm    10 cm Proximal to Olecranon Process  28.3 cm    Olecranon Process  24 cm    15 cm Proximal to Ulnar  Styloid Process  23.4 cm    10 cm Proximal to Ulnar Styloid Process  20.4 cm    Just Proximal to Ulnar Styloid Process  15.4 cm    Across Hand at [employer]  19.4 cm    At [place] of 2nd Digit  6.5 cm    Other  95.5    Other  94    Other  94                [facility] Adult PT Treatment/Exercise - 06/16/19 0001      Shoulder Exercises: Supine   Other Supine Exercises  pec stretch in supine 3x 20 seconds VC for no pain      Shoulder Exercises: Sidelying   Internal Rotation  --    Internal Rotation Limitations  --    Other Sidelying Exercises  Side-lying IR stretch on the L 3x 20 seconds VC for form and to avoid painful stretching.       Shoulder Exercises: Standing   External Rotation  Strengthening;Left;10 reps;Theraband    External Rotation Limitations  red theraband VC for scapular placement to prevent compensation and use of towel at elbow for tactile cueing.     Internal Rotation  Strengthening;Left;10 reps    Internal Rotation Limitations  red Theraband and VC for scapular placement and towel at elbow to prevent compensation.     Row  Strengthening;Both;10 reps    Row Limitations  red theraband VC for scapular retraction to prevent over compensation of upper trapezius.     Other Standing Exercises  Standing internal rotation stretch with towel 10x5 seconds VC to prevent pain             PT Education - 06/16/19 1356    Education Details  Access Code: [email or web address], Pt was educated on new HEP discussed avoiding pain and resting if she has sore muscles. She was educated to wear her compression garment if she works on her total gym at home. Assessed pt glove and UE garment. The UE garment looked great she needs a smaller size with compression glove due to it is too big and gives no compression at the wrist. Pt was discharged today and is aware. Discussed the direction for self MLD with visuals drawn on handout.    Person(s) Educated  Patient    Methods   Explanation;Demonstration;Tactile cues;Verbal cues;Handout    Comprehension  Verbalized understanding;Returned demonstration       PT Short Term Goals - 06/16/19 1320      PT SHORT TERM GOAL #4   Title  Pt will  have reduction in LUE circumferential measurements by 0.5 cm or greater within 2 weeks in order to demonstrate improved utilization of garment and pump.    Baseline  Pt has met this goal in some areas since 4 weeks but has remained the same in others. 2 weeks ago she was down significantly but she tends to fluctuate.    Status  Not Met        PT Long Term Goals - 06/16/19 1320      PT LONG TERM GOAL #1   Title  Pt will report the pain and discomfort in her left upper quadrant is decreased by 50%     Baseline  Pt reports 50% improvement in the past 2 weeks.    Status  Achieved      PT LONG TERM GOAL #2   Title  Pt will be independent in a home exercise program for strengthening     Baseline  Pt reports she is performing her exercises at home consistently.    Status  Achieved      PT LONG TERM GOAL #3   Title  Pt will report she is able to manage her symptoms of lymphedema on her own at home    Baseline  Pt reports that she feels independent in managing her lymphedema at home and returning to MD if she notices an exacerbation.    Status  Achieved      PT LONG TERM GOAL #4   Title  Pt will increase left shoulder painless abduction to 165 degress so she can have easier functional mobility     Baseline  155 degrees    Status  Not Met            Plan - 06/16/19 1313    Clinical Impression Statement  Pt presents to physical therapy session today with improved edema from her session 3 weeks ago as demonstrated by decreased measurements. She is still up from 4 weeks ago but improved. She is currently using her flexitouch at home and consistently wearing compression garment and has met most of her goals except for her L shoulder abduction ROM goal and her circumferential  measurement goal. Strengthening/stretching exercises for the L shoulder were added today with light resistance with no increase in pain from baseline. Pt will be discharged at this time for home management and will return to MD for PT referral if she has an increase in swelling.    Personal Factors and Comorbidities  Comorbidity 2    Comorbidities  2 surgeries for breast cancer, radiation    Clinical Impairments Affecting Rehab Potential  17 nodes removed with second surgery needed for lymph node dissection , radiation     PT Frequency  1x / week    PT Duration  4 weeks    PT Treatment/Interventions  ADLs/Self Care Home Management;DME Instruction;Therapeutic activities;Therapeutic exercise;Orthotic Fit/Training;Patient/family education;Manual techniques;Neuromuscular re-education;Manual lymph drainage;Compression bandaging;Scar mobilization;Taping;Passive range of motion    PT Next Visit Plan  Pt is discharged from physical therapy after this visit and will return if needed.    PT Home Exercise Plan  Access Code: [email or web address]    Consulted and Agree with Plan of Care  Patient       Patient will benefit from skilled therapeutic intervention in order to improve the following deficits and impairments:  Decreased knowledge of precautions, Pain, Impaired UE functional use, Decreased range of motion, Postural dysfunction, Decreased skin integrity, Increased fascial restricitons, Decreased scar mobility, Impaired perceived functional ability, Decreased  strength, Decreased activity tolerance, Increased edema  Visit Diagnosis: Stiffness of left shoulder joint  Abnormal posture  Muscle weakness (generalized)  Postmastectomy lymphedema  Malignant neoplasm of overlapping sites of left breast in female, estrogen receptor positive [facility])     Problem List Patient Active Problem List   Diagnosis Date Noted  . Need for vaccination 06/09/2019  . H/O left mastectomy 06/09/2019  . MDD (major depressive  disorder), recurrent episode, moderate ([place]) 05/06/2019  . GAD (generalized anxiety disorder) 05/06/2019  . Insomnia due to mental condition 05/06/2019  . Cancer of left female breast  ([place]) 06/13/2018  . Malignant neoplasm of overlapping sites of left breast in female, estrogen receptor positive ([place]) 05/09/2018  . Blurring of visual image of both eyes 12/06/2017  . Postherpetic neuralgia 12/06/2017  . Nasal turbinate hypertrophy 10/09/2016  . Nasal congestion 08/31/2016  . Deviated septum 08/31/2016  . ESOPHAGEAL REFLUX 10/10/2009   .[place], PT  06/16/2019, 2:46 PM  [place], [place], [address] Phone: [phone]   Fax:  [phone]  Name: Dawn Booker MRN: [id number] Date of Birth: 1956-01-26

## 2019-06-16 NOTE — Telephone Encounter (Signed)
Pt states that you offered to prescribe once weekly vitamin D at her initial visit rather than her having to double up on the OTC dosage and take it daily.  Please advise.  Charyl Bigger, CMA

## 2019-06-16 NOTE — Telephone Encounter (Signed)
LVM for pt to call to discuss.  T. Nelson, CMA  

## 2019-06-16 NOTE — Patient Instructions (Signed)
Access Code: [email or web address]  URL: [email or web address]  Date: 06/16/2019  Prepared by: Tomma Rakers   Exercises Sleeper Stretch - 3 reps - 1 sets - 20 seconds no pain hold - 1x daily - 7x weekly Supine Chest Stretch with Elbows Bent - 3 reps - 1 sets - 20 seconds avoidpain you can use your other arm to add extra stretch hold - 1x daily - 7x weekly Standing Shoulder Internal Rotation Stretch with Towel - 10 reps - 1 sets - 10 seconds no pain keep shoulder blades down and back do not slump. hold - 1x daily - 7x weekly Shoulder External Rotation Reactive Isometrics - 10 reps - 2 sets - 1x daily - 7x weekly Shoulder Internal Rotation Reactive Isometrics - 10 reps - 2 sets - 1x daily - 7x weekly Standing Shoulder Row with Anchored Resistance - 10 reps - 2 sets - 1x daily - 7x weekly

## 2019-06-16 NOTE — Telephone Encounter (Signed)
-----   Message from Mellody Dance, DO sent at 06/16/2019  9:27 AM EDT ----- Regarding: hey, real quick Not sure where she is getting vitamin D.  Med list says over-the-counter.  Hence, there is nothing to refill.

## 2019-06-17 ENCOUNTER — Encounter: Payer: Self-pay | Admitting: *Deleted

## 2019-06-17 MED ORDER — VITAMIN D (ERGOCALCIFEROL) 1.25 MG (50000 UNIT) PO CAPS: 50000.0000 [IU] | ORAL_CAPSULE | ORAL | 3 refills | Status: DC

## 2019-06-17 MED FILL — VIT D2 1.25 MG (50,000 UNIT: 1.25 MG | 84 days supply | Qty: 12 | Fill #0

## 2019-06-17 NOTE — Addendum Note (Signed)
Addended by: Fonnie Mu on: 06/17/2019 05:20 PM   Modules accepted: Orders

## 2019-06-17 NOTE — Telephone Encounter (Signed)
Okay if she prefers to give once weekly then please go ahead and send in prescription for ergocalciferol 50 K IUs once weekly dispense 12 with 3 refills. -Please make sure if she is going to do this first over-the-counter, that the over-the-counter is removed from her med list if that is what she so chooses.  -Please let her know that this should be rechecked in 3 months.  Thank you.

## 2019-06-17 NOTE — Telephone Encounter (Signed)
Pt informed that RX was sent to pharmacy.  Advised pt to be sure to D/C OTC vitamin D.  Pt expressed understanding and is agreeable.  Charyl Bigger, CMA

## 2019-06-18 DIAGNOSIS — R1032 Left lower quadrant pain: Secondary | ICD-10-CM | POA: Diagnosis not present

## 2019-06-23 ENCOUNTER — Other Ambulatory Visit: Payer: Self-pay

## 2019-06-23 ENCOUNTER — Other Ambulatory Visit: Payer: 59

## 2019-06-23 DIAGNOSIS — F331 Major depressive disorder, recurrent, moderate: Secondary | ICD-10-CM | POA: Diagnosis not present

## 2019-06-23 DIAGNOSIS — Z7689 Persons encountering health services in other specified circumstances: Secondary | ICD-10-CM

## 2019-06-23 DIAGNOSIS — Z1211 Encounter for screening for malignant neoplasm of colon: Secondary | ICD-10-CM | POA: Diagnosis not present

## 2019-06-23 DIAGNOSIS — F411 Generalized anxiety disorder: Secondary | ICD-10-CM | POA: Diagnosis not present

## 2019-06-23 DIAGNOSIS — R1032 Left lower quadrant pain: Secondary | ICD-10-CM | POA: Diagnosis not present

## 2019-06-23 DIAGNOSIS — Z9012 Acquired absence of left breast and nipple: Secondary | ICD-10-CM

## 2019-06-23 DIAGNOSIS — R140 Abdominal distension (gaseous): Secondary | ICD-10-CM | POA: Diagnosis not present

## 2019-06-23 DIAGNOSIS — C50812 Malignant neoplasm of overlapping sites of left female breast: Secondary | ICD-10-CM

## 2019-06-23 DIAGNOSIS — Z23 Encounter for immunization: Secondary | ICD-10-CM

## 2019-06-23 DIAGNOSIS — Z170 Estrogen receptor positive status [ER+]: Secondary | ICD-10-CM | POA: Diagnosis not present

## 2019-06-24 ENCOUNTER — Other Ambulatory Visit (HOSPITAL_COMMUNITY): Payer: Self-pay | Admitting: Gastroenterology

## 2019-06-24 ENCOUNTER — Other Ambulatory Visit: Payer: Self-pay | Admitting: Gastroenterology

## 2019-06-24 DIAGNOSIS — F331 Major depressive disorder, recurrent, moderate: Secondary | ICD-10-CM | POA: Diagnosis not present

## 2019-06-24 DIAGNOSIS — R1032 Left lower quadrant pain: Secondary | ICD-10-CM

## 2019-06-24 DIAGNOSIS — F411 Generalized anxiety disorder: Secondary | ICD-10-CM | POA: Diagnosis not present

## 2019-06-24 DIAGNOSIS — F5105 Insomnia due to other mental disorder: Secondary | ICD-10-CM | POA: Diagnosis not present

## 2019-06-24 LAB — CBC WITH DIFFERENTIAL/PLATELET
Basophils Absolute: 0 10*3/uL (ref 0.0–0.2)
Basos: 1 %
EOS (ABSOLUTE): 0.1 10*3/uL (ref 0.0–0.4)
Eos: 1 %
Hematocrit: 41.5 % (ref 34.0–46.6)
Hemoglobin: 13.9 g/dL (ref 11.1–15.9)
Immature Grans (Abs): 0 10*3/uL (ref 0.0–0.1)
Immature Granulocytes: 1 %
Lymphocytes Absolute: 1.2 10*3/uL (ref 0.7–3.1)
Lymphs: 28 %
MCH: 31.4 pg (ref 26.6–33.0)
MCHC: 33.5 g/dL (ref 31.5–35.7)
MCV: 94 fL (ref 79–97)
Monocytes Absolute: 0.3 10*3/uL (ref 0.1–0.9)
Monocytes: 7 %
Neutrophils Absolute: 2.7 10*3/uL (ref 1.4–7.0)
Neutrophils: 62 %
Platelets: 191 10*3/uL (ref 150–450)
RBC: 4.42 x10E6/uL (ref 3.77–5.28)
RDW: 12.7 % (ref 11.7–15.4)
WBC: 4.3 10*3/uL (ref 3.4–10.8)

## 2019-06-24 LAB — HEMOGLOBIN A1C
Est. average glucose Bld gHb Est-mCnc: 120 mg/dL
Hgb A1c MFr Bld: 5.8 % — ABNORMAL HIGH (ref 4.8–5.6)

## 2019-06-24 LAB — T3: T3, Total: 114 ng/dL (ref 71–180)

## 2019-06-24 LAB — TSH: TSH: 2.1 u[IU]/mL (ref 0.450–4.500)

## 2019-06-24 LAB — T4, FREE: Free T4: 1.16 ng/dL (ref 0.82–1.77)

## 2019-06-24 MED FILL — PEG-3350 AND ELECTROLYTES S: 236 | 1 days supply | Qty: 4000 | Fill #0

## 2019-06-25 ENCOUNTER — Encounter: Payer: Self-pay | Admitting: Family Medicine

## 2019-06-26 ENCOUNTER — Other Ambulatory Visit: Payer: Self-pay

## 2019-06-26 ENCOUNTER — Encounter: Payer: Self-pay | Admitting: Family Medicine

## 2019-06-26 ENCOUNTER — Ambulatory Visit (INDEPENDENT_AMBULATORY_CARE_PROVIDER_SITE_OTHER): Payer: 59 | Admitting: Family Medicine

## 2019-06-26 VITALS — BP 129/89 | HR 88 | Temp 98.3°F | Resp 14 | Wt 159.0 lb

## 2019-06-26 DIAGNOSIS — G4700 Insomnia, unspecified: Secondary | ICD-10-CM | POA: Diagnosis not present

## 2019-06-26 DIAGNOSIS — E559 Vitamin D deficiency, unspecified: Secondary | ICD-10-CM

## 2019-06-26 DIAGNOSIS — F331 Major depressive disorder, recurrent, moderate: Secondary | ICD-10-CM | POA: Diagnosis not present

## 2019-06-26 DIAGNOSIS — Z170 Estrogen receptor positive status [ER+]: Secondary | ICD-10-CM

## 2019-06-26 DIAGNOSIS — E7800 Pure hypercholesterolemia, unspecified: Secondary | ICD-10-CM | POA: Insufficient documentation

## 2019-06-26 DIAGNOSIS — R7302 Impaired glucose tolerance (oral): Secondary | ICD-10-CM | POA: Diagnosis not present

## 2019-06-26 DIAGNOSIS — R030 Elevated blood-pressure reading, without diagnosis of hypertension: Secondary | ICD-10-CM | POA: Diagnosis not present

## 2019-06-26 DIAGNOSIS — Z780 Asymptomatic menopausal state: Secondary | ICD-10-CM | POA: Insufficient documentation

## 2019-06-26 DIAGNOSIS — C50812 Malignant neoplasm of overlapping sites of left female breast: Secondary | ICD-10-CM | POA: Diagnosis not present

## 2019-06-26 DIAGNOSIS — E785 Hyperlipidemia, unspecified: Secondary | ICD-10-CM

## 2019-06-26 DIAGNOSIS — Z23 Encounter for immunization: Secondary | ICD-10-CM

## 2019-06-26 DIAGNOSIS — M8580 Other specified disorders of bone density and structure, unspecified site: Secondary | ICD-10-CM | POA: Insufficient documentation

## 2019-06-26 MED ORDER — ROSUVASTATIN CALCIUM 10 MG PO TABS: 10.0000 mg | ORAL_TABLET | Freq: Every day | ORAL | 1 refills | Status: DC

## 2019-06-26 MED FILL — ROSUVASTATIN CALCIUM 10 MG: 10 | 90 days supply | Qty: 90 | Fill #0

## 2019-06-26 NOTE — Patient Instructions (Signed)
Your goal blood pressure should be 135/85 or less on a regular basis, her medications should be started.  Normal blood pressure is 120/80 or less.    Hypertension Hypertension, commonly called high blood pressure, is when the force of blood pumping through the arteries is too strong. The arteries are the blood vessels that carry blood from the heart throughout the body. Hypertension forces the heart to work harder to pump blood and may cause arteries to become narrow or stiff. Having untreated or uncontrolled hypertension can cause heart attacks, strokes, kidney disease, and other problems. A blood pressure reading consists of a higher number over a lower number. Ideally, your blood pressure should be below 120/80. The first ("top") number is called the systolic pressure. It is a measure of the pressure in your arteries as your heart beats. The second ("bottom") number is called the diastolic pressure. It is a measure of the pressure in your arteries as the heart relaxes. What are the causes? The cause of this condition is not known. What increases the risk? Some risk factors for high blood pressure are under your control. Others are not. Factors you can change  Smoking.  Having type 2 diabetes mellitus, high cholesterol, or both.  Not getting enough exercise or physical activity.  Being overweight.  Having too much fat, sugar, calories, or salt (sodium) in your diet.  Drinking too much alcohol. Factors that are difficult or impossible to change  Having chronic kidney disease.  Having a family history of high blood pressure.  Age. Risk increases with age.  Race. You may be at higher risk if you are African-American.  Gender. Men are at higher risk than women before age 45. After age 65, women are at higher risk than men.  Having obstructive sleep apnea.  Stress. What are the signs or symptoms? Extremely high blood pressure (hypertensive crisis) may  cause:  Headache.  Anxiety.  Shortness of breath.  Nosebleed.  Nausea and vomiting.  Severe chest pain.  Jerky movements you cannot control (seizures).  How is this diagnosed? This condition is diagnosed by measuring your blood pressure while you are seated, with your arm resting on a surface. The cuff of the blood pressure monitor will be placed directly against the skin of your upper arm at the level of your heart. It should be measured at least twice using the same arm. Certain conditions can cause a difference in blood pressure between your right and left arms. Certain factors can cause blood pressure readings to be lower or higher than normal (elevated) for a short period of time:  When your blood pressure is higher when you are in a health care provider's office than when you are at home, this is called white coat hypertension. Most people with this condition do not need medicines.  When your blood pressure is higher at home than when you are in a health care provider's office, this is called masked hypertension. Most people with this condition may need medicines to control blood pressure.  If you have a high blood pressure reading during one visit or you have normal blood pressure with other risk factors:  You may be asked to return on a different day to have your blood pressure checked again.  You may be asked to monitor your blood pressure at home for 1 week or longer.  If you are diagnosed with hypertension, you may have other blood or imaging tests to help your health care provider understand your overall risk   for other conditions. How is this treated? This condition is treated by making healthy lifestyle changes, such as eating healthy foods, exercising more, and reducing your alcohol intake. Your health care provider may prescribe medicine if lifestyle changes are not enough to get your blood pressure under control, and if:  Your systolic blood pressure is above  130.  Your diastolic blood pressure is above 80.  Your personal target blood pressure may vary depending on your medical conditions, your age, and other factors. Follow these instructions at home: Eating and drinking  Eat a diet that is high in fiber and potassium, and low in sodium, added sugar, and fat. An example eating plan is called the DASH (Dietary Approaches to Stop Hypertension) diet. To eat this way: ? Eat plenty of fresh fruits and vegetables. Try to fill half of your plate at each meal with fruits and vegetables. ? Eat whole grains, such as whole wheat pasta, brown rice, or whole grain bread. Fill about one quarter of your plate with whole grains. ? Eat or drink low-fat dairy products, such as skim milk or low-fat yogurt. ? Avoid fatty cuts of meat, processed or cured meats, and poultry with skin. Fill about one quarter of your plate with lean proteins, such as fish, chicken without skin, beans, eggs, and tofu. ? Avoid premade and processed foods. These tend to be higher in sodium, added sugar, and fat.  Reduce your daily sodium intake. Most people with hypertension should eat less than 1,500 mg of sodium a day.  Limit alcohol intake to no more than 1 drink a day for nonpregnant women and 2 drinks a day for men. One drink equals 12 oz of beer, 5 oz of wine, or 1 oz of hard liquor. Lifestyle  Work with your health care provider to maintain a healthy body weight or to lose weight. Ask what an ideal weight is for you.  Get at least 30 minutes of exercise that causes your heart to beat faster (aerobic exercise) most days of the week. Activities may include walking, swimming, or biking.  Include exercise to strengthen your muscles (resistance exercise), such as pilates or lifting weights, as part of your weekly exercise routine. Try to do these types of exercises for 30 minutes at least 3 days a week.  Do not use any products that contain nicotine or tobacco, such as cigarettes and  e-cigarettes. If you need help quitting, ask your health care provider.  Monitor your blood pressure at home as told by your health care provider.  Keep all follow-up visits as told by your health care provider. This is important. Medicines  Take over-the-counter and prescription medicines only as told by your health care provider. Follow directions carefully. Blood pressure medicines must be taken as prescribed.  Do not skip doses of blood pressure medicine. Doing this puts you at risk for problems and can make the medicine less effective.  Ask your health care provider about side effects or reactions to medicines that you should watch for. Contact a health care provider if:  You think you are having a reaction to a medicine you are taking.  You have headaches that keep coming back (recurring).  You feel dizzy.  You have swelling in your ankles.  You have trouble with your vision. Get help right away if:  You develop a severe headache or confusion.  You have unusual weakness or numbness.  You feel faint.  You have severe pain in your chest or abdomen.    You vomit repeatedly.  You have trouble breathing. Summary  Hypertension is when the force of blood pumping through your arteries is too strong. If this condition is not controlled, it may put you at risk for serious complications.  Your personal target blood pressure may vary depending on your medical conditions, your age, and other factors. For most people, a normal blood pressure is less than 120/80.  Hypertension is treated with lifestyle changes, medicines, or a combination of both. Lifestyle changes include weight loss, eating a healthy, low-sodium diet, exercising more, and limiting alcohol. This information is not intended to replace advice given to you by your health care provider. Make sure you discuss any questions you have with your health care provider. Document Released: 08/20/2005 Document Revised: 07/18/2016  Document Reviewed: 07/18/2016 [employer] Interactive Patient Education  2018 [employer]    How to Take Your Blood Pressure   Blood pressure is a measurement of how strongly your blood is pressing against the walls of your arteries. Arteries are blood vessels that carry blood from your heart throughout your body. Your health care provider takes your blood pressure at each office visit. You can also take your own blood pressure at home with a blood pressure machine. You may need to take your own blood pressure:  To confirm a diagnosis of high blood pressure (hypertension).  To monitor your blood pressure over time.  To make sure your blood pressure medicine is working.  Supplies needed: To take your blood pressure, you will need a blood pressure machine. You can buy a blood pressure machine, or blood pressure monitor, at most drugstores or online. There are several types of home blood pressure monitors. When choosing one, consider the following:  Choose a monitor that has an arm cuff.  Choose a monitor that wraps snugly around your upper arm. You should be able to fit only one finger between your arm and the cuff.  Do not choose a monitor that measures your blood pressure from your wrist or finger.  Your health care provider can suggest a reliable monitor that will meet your needs. How to prepare To get the most accurate reading, avoid the following for 30 minutes before you check your blood pressure:  Drinking caffeine.  Drinking alcohol.  Eating.  Smoking.  Exercising.  Five minutes before you check your blood pressure:  Empty your bladder.  Sit quietly without talking in a dining chair, rather than in a soft couch or armchair.  How to take your blood pressure To check your blood pressure, follow the instructions in the manual that came with your blood pressure monitor. If you have a digital blood pressure monitor, the instructions may be as follows: 1. Sit up  straight. 2. Place your feet on the floor. Do not cross your ankles or legs. 3. Rest your left arm at the level of your heart on a table or desk or on the arm of a chair. 4. Pull up your shirt sleeve. 5. Wrap the blood pressure cuff around the upper part of your left arm, 1 inch (2.5 cm) above your elbow. It is best to wrap the cuff around bare skin. 6. Fit the cuff snugly around your arm. You should be able to place only one finger between the cuff and your arm. 7. Position the cord inside the groove of your elbow. 8. Press the power button. 9. Sit quietly while the cuff inflates and deflates. 10. Read the digital reading on the monitor screen and write   it down (record it). 11. Wait 2-3 minutes, then repeat the steps, starting at step 1.  What does my blood pressure reading mean? A blood pressure reading consists of a higher number over a lower number. Ideally, your blood pressure should be below 120/80. The first ("top") number is called the systolic pressure. It is a measure of the pressure in your arteries as your heart beats. The second ("bottom") number is called the diastolic pressure. It is a measure of the pressure in your arteries as the heart relaxes. Blood pressure is classified into four stages. The following are the stages for adults who do not have a short-term serious illness or a chronic condition. Systolic pressure and diastolic pressure are measured in a unit called mm Hg. Normal  Systolic pressure: below 120.  Diastolic pressure: below 80. Elevated  Systolic pressure: 120-129.  Diastolic pressure: below 80. Hypertension stage 1  Systolic pressure: 130-139.  Diastolic pressure: 80-89. Hypertension stage 2  Systolic pressure: 140 or above.  Diastolic pressure: 90 or above. You can have prehypertension or hypertension even if only the systolic or only the diastolic number in your reading is higher than normal. Follow these instructions at home:  Check your blood  pressure as often as recommended by your health care provider.  Take your monitor to the next appointment with your health care provider to make sure: ? That you are using it correctly. ? That it provides accurate readings.  Be sure you understand what your goal blood pressure numbers are.  Tell your health care provider if you are having any side effects from blood pressure medicine. Contact a health care provider if:  Your blood pressure is consistently high. Get help right away if:  Your systolic blood pressure is higher than 180.  Your diastolic blood pressure is higher than 110. This information is not intended to replace advice given to you by your health care provider. Make sure you discuss any questions you have with your health care provider. Document Released: 01/27/2016 Document Revised: 04/10/2016 Document Reviewed: 01/27/2016 [employer] Interactive Patient Education  2018 [employer]    

## 2019-06-26 NOTE — Progress Notes (Signed)
Assessment and plan:  1. Hyperlipidemia, unspecified hyperlipidemia type   2. Glucose intolerance (impaired glucose tolerance)   3. Elevated blood pressure reading   4. MDD (major depressive disorder), recurrent episode, moderate ([place])   5. Malignant neoplasm of overlapping sites of left breast in female, estrogen receptor positive ([place])   6. Vitamin D deficiency   7. Need for shingles vaccine   8. Insomnia, unspecified type     - Reviewed recent lab work (06/23/2019) in depth with patient today.  All lab work within normal limits unless otherwise noted.  Extensive education provided and all questions answered.  Glucose Intolerance - A1c last check elevated at 5.8. - Reviewed that this is the prediabetic range.  - Counseled patient on prevention of diabetes and discussed dietary and lifestyle modification as first line.    - Importance of low carb, heart-healthy diet discussed with patient in addition to regular aerobic exercise of 33min 5d/week or more.   - Discussed that patient may begin activity slowly, with 5-10 minutes of exercise per day.   - Handouts provided at patient's desire and or told to go online at the American Diabetes Association website for further information  - We will continue to monitor  Elevated BP Reading - Discussed importance of keeping BP stable and controlled. - Extensively reviewed risks of chronic high blood pressure.  - Advised patient to check her BP at home. - Keep ambulatory BP log and bring to next OV. - Prudent home blood pressure monitoring habits discussed with patient today.  - Will continue to monitor.  Hyperlipidemia - Cholesterol levels at goal last check. - Managed on statin. - Discussed importance of taking Crestor at night.  - To help continue to keep cholesterol at goal, advised prudent dietary changes such as low saturated & trans fat to improve  hyperlipidemia, and low carb to improve hypertriglyceridemia.  - Will continue to monitor and re-check as recommended.  MDD, Recurrent Episode, Moderate - Patient managed by Dr. Shea Evans of [place]. - Discussed that Psychiatry will continue to manage patient's treatment plan as they see fit PRN.  - In addition to psychiatric intervention, reviewed the "spokes of the wheel" of mood and health management.  Stressed the importance of ongoing prudent habits, including regular exercise, appropriate sleep hygiene, healthful dietary habits, and prayer/meditation to relax.  - Will continue to monitor.  BMI Counseling - Body mass index is 26.26 kg/m Explained to patient what BMI refers to, and what it means medically.    Told patient to think about it as a "medical risk stratification measurement" and how increasing BMI is associated with increasing risk/ or worsening state of various diseases such as hypertension, hyperlipidemia, diabetes, premature OA, depression etc.  American Heart Association guidelines for healthy diet, basically Mediterranean diet, and exercise guidelines of 30 minutes 5 days per week or more discussed in detail.  Health counseling performed.  All questions answered.  Lifestyle & Preventative Health Maintenance - Advised patient to continue working toward exercising to improve overall mental, physical, and emotional health.   - Healthy dietary habits encouraged.  Especially given her experience with cancer, discussed importance of high antioxidant, low-carb diet, with high amounts of lean protein.  - Patient should also consume adequate amounts of water.   Education and routine counseling performed. Handouts provided.  Recommendations - Patient will return for re-check of A1c in 6-12 months. - Otherwise, return for chronic health maintenance in six months. - If  BP not at goal, patient will call in one month.   Orders Placed This Encounter  Procedures  .  Varicella-zoster vaccine IM (Shingrix)    Meds ordered this encounter  Medications  . rosuvastatin (CRESTOR) 10 MG tablet    Sig: Take 1 tablet (10 mg total) by mouth at bedtime.    Dispense:  90 tablet    Refill:  1    Medications Discontinued During This Encounter  Medication Reason  . rosuvastatin (CRESTOR) 10 MG tablet Reorder      Return for re-check A1c in 6-12 months; call in 1 month with BP log.   Anticipatory guidance and routine counseling done re: condition, txmnt options and need for follow up. All questions of patient's were answered.   Gross side effects, risk and benefits, and alternatives of medications discussed with patient.  Patient is aware that all medications have potential side effects and we are unable to predict every sideeffect or drug-drug interaction that may occur.  Expresses verbal understanding and consents to current therapy plan and treatment regiment.  Please see AVS handed out to patient at the end of our visit for additional patient instructions/ counseling done pertaining to today's office visit.  Note:  This document was prepared using Dragon voice recognition software and may include unintentional dictation errors.  This document serves as a record of services personally performed by Mellody Dance, DO. It was created on her behalf by Toni Amend, a trained medical scribe. The creation of this record is based on the scribe's personal observations and the provider's statements to them.   This case required medical decision making of at least moderate complexity. The above documentation has been reviewed to be accurate and was completed by Marjory Sneddon, D.O.    ----------------------------------------------------------------------------------------------------------------------  Subjective:   CC:   Dawn Booker is a 63 y.o. female who presents to [facility] today for review and discussion of  recent bloodwork that was done in addition to f/up on chronic conditions we are managing for pt.  1. All recent blood work that we ordered was reviewed with patient today.  Patient was counseled on all abnormalities and we discussed dietary and lifestyle changes that could help those values (also medications when appropriate).  Extensive health counseling performed and all patient's concerns/ questions were addressed.  See labs below and also plan for more details of these abnormalities  - Elevated BP Reading Notes her BP fluctuates, but she doesn't check it at home.  States her blood pressure was normal before her surgery last October.  Notes her weight was also 10-12 lbs less before her surgery.  Says "I have laid around the majority of the year, and just started trying to find my new normal in the past month; moving around more," etc.  Says "the weight hopefully will go down."  She is hoping to improve her objective health by beginning to exercise more regularly and make changes to her lifestyle.  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  They have been married for 44 years.  - Behavioral Health Patient continues to follow up with Dr. Shea Evans of psychiatry.  Says "I've only talked to her twice," but had a good experience.  Notes she had the genetic testing done recently and is waiting to hear back on the results.     Wt Readings from Last 3 Encounters:  06/26/19 159 lb (  72.1 kg)  06/09/19 159 lb (72.1 kg)  04/29/19 157 lb 11.2 oz (71.5 kg)   BP Readings from Last 3 Encounters:  06/26/19 129/89  06/09/19 137/88  04/29/19 (!) 144/96   Pulse Readings from Last 3 Encounters:  06/26/19 88  06/09/19 89  04/29/19 88   BMI Readings from Last 3 Encounters:  06/26/19 26.26 kg/m  06/09/19 26.26 kg/m  04/29/19 25.45 kg/m     Patient Care Team    Relationship Specialty Notifications Start End  Mellody Dance, DO PCP - General Family Medicine  06/09/19   Nicholas Lose, MD Consulting Physician Hematology and Oncology  05/20/18   Jovita Kussmaul, MD Consulting Physician General Surgery  05/20/18   Eppie Gibson, MD Attending Physician Radiation Oncology  05/20/18   Ursula Alert, MD Consulting Physician Psychiatry  06/09/19    Comment: Treats patient's mood disorder  Gardenia Phlegm, NP Nurse Practitioner Hematology and Oncology  06/09/19   Meredith Pel, MD Consulting Physician Orthopedic Surgery  06/09/19     Full medical history updated and reviewed in the office today  Patient Active Problem List   Diagnosis Date Noted  . Insomnia 06/26/2019  . Glucose intolerance (impaired glucose tolerance) 06/26/2019  . Hyperlipidemia 06/26/2019  . Vitamin D deficiency 06/26/2019  . Osteopenia after menopause 06/26/2019  . Need for vaccination 06/09/2019  . H/O left mastectomy 06/09/2019  . MDD (major depressive disorder), recurrent episode, moderate ([place]) 05/06/2019  . GAD (generalized anxiety disorder) 05/06/2019  . Insomnia due to mental condition 05/06/2019  . Cancer of left female breast  ([place]) 06/13/2018  . Malignant neoplasm of overlapping sites of left breast in female, estrogen receptor positive ([place]) 05/09/2018  . Blurring of visual image of both eyes 12/06/2017  . Postherpetic neuralgia 12/06/2017  . Nasal turbinate hypertrophy 10/09/2016  . Nasal congestion 08/31/2016  . Deviated septum 08/31/2016  . ESOPHAGEAL REFLUX 10/10/2009    Past Medical History:  Diagnosis Date  . ADD (attention deficit disorder)   . Anxiety   . Cancer [facility])    breast cancer  . Depression   . GERD (gastroesophageal reflux disease)   . Hiatal hernia   . History of radiation therapy 08/13/2018- 09/25/2018   Left Chest wall and IM nodes/ 50 Gy in 25 fractions, Left supraclaicular fossa and PAB/ 50 Gy in 25 fractions, Left chest wall scar boost/ 10 Gy in 5 fractions.   . Hyperlipemia, mixed    . Intermittent palpitations   . Malignant neoplasm of overlapping sites of left breast in female, estrogen receptor positive ([place]) 05/06/2018   oncologist-  dr Lindi Adie--  Invasive Lobular Cancer, CIS, Stage IB, Grade 2 (pT3,pN1a,cM0), ER+---  s/p  left mastectomy with sln dissections and right mastectomy (benign)  . PONV (postoperative nausea and vomiting)   . Right thyroid nodule   . Shingles 12/2017    Past Surgical History:  Procedure Laterality Date  . AXILLARY LYMPH NODE DISSECTION Left 07/09/2018   Procedure: COMPLETION OF LEFT AXILLARY LYMPH NODE DISSECTION;  Surgeon: Jovita Kussmaul, MD;  Location: [facility];  Service: General;  Laterality: Left;  . BREAST EXCISIONAL BIOPSY Left 1995   benign  . CARDIOVASCULAR STRESS TEST  06/21/2017   normal nuclear stress study w/ no ischemia/  normal LV function and wall function, nuclear stress ef 70%  . ELBOW SURGERY Left child  . MASTECTOMY W/ SENTINEL NODE BIOPSY Left 06/13/2018  . MASTECTOMY WITH RADIOACTIVE SEED GUIDED EXCISION AND AXILLARY SENTINEL LYMPH NODE BIOPSY  Bilateral 06/13/2018   Procedure: LEFT MASTECTOMY WITH SENTINEL LYMPH NODE MAPPING AND TARGETED NODE DISSECTION AND RIGHT PROPHYLATIC MASTECTOMY;  Surgeon: Jovita Kussmaul, MD;  Location: [place];  Service: General;  Laterality: Bilateral;  . THYROIDECTOMY Left 10-31-2001   dr gerkin @[facility]   . TRANSTHORACIC ECHOCARDIOGRAM  06/21/2017   ef 60-65%/  trivial MR (no evidence mvp)  . TUBAL LIGATION  yrs ago  . VAGINAL HYSTERECTOMY  1990s    Social History   Tobacco Use  . Smoking status: Former Smoker    Packs/day: 0.25    Years: 0.00    Pack years: 0.00    Types: Cigarettes    Quit date: 12/05/2017    Years since quitting: 1.5  . Smokeless tobacco: Never Used  Substance Use Topics  . Alcohol use: Not Currently    Family Hx: Family History  Problem Relation Age of Onset  . Hypertension Mother   . Heart attack Mother   . Breast cancer Mother   . Bipolar disorder Father    . Breast cancer Maternal Grandmother   . Breast cancer Cousin      Medications: Current Outpatient Medications  Medication Sig Dispense Refill  . acetaminophen (TYLENOL) 500 MG tablet Take 1,000 mg by mouth at bedtime.     Marland Kitchen buPROPion (WELLBUTRIN XL) 150 MG 24 hr tablet Take 150 mg by mouth daily.    . Cholecalciferol (VITAMIN D) 2000 units CAPS Take 2,000 Units by mouth daily.     Marland Kitchen denosumab (PROLIA) 60 MG/ML SOSY injection Inject 60 mg into the skin once.    . diphenhydrAMINE (BENADRYL) 25 mg capsule Take 25 mg by mouth at bedtime.    . folic acid (FOLVITE) A999333 MCG tablet Take 400 mcg by mouth daily.    Marland Kitchen LORazepam (ATIVAN) 0.5 MG tablet Take 1 tablet (0.5 mg total) by mouth every 8 (eight) hours as needed. for anxiety 30 tablet 1  . Magnesium 400 MG TABS Take 400 mg by mouth.    Marland Kitchen omeprazole (PRILOSEC) 40 MG capsule Take 1 capsule (40 mg total) by mouth daily. Need appt b-4 RF 90 capsule 0  . OVER THE COUNTER MEDICATION 2 tablets daily. Vegan Calcium 500    . rosuvastatin (CRESTOR) 10 MG tablet Take 1 tablet (10 mg total) by mouth at bedtime. 90 tablet 1  . temazepam (RESTORIL) 30 MG capsule Take 1 capsule (30 mg total) by mouth at bedtime. 30 capsule 1  . Vitamin D, Ergocalciferol, (DRISDOL) 1.25 MG (50000 UT) CAPS capsule Take 1 capsule (50,000 Units total) by mouth every 7 (seven) days. 12 capsule 3   No current facility-administered medications for this visit.     Allergies:  Allergies  Allergen Reactions  . Lamictal [Lamotrigine] Other (See Comments)    Katherina Right Syndrome  . Cymbalta [Duloxetine Hcl]     Katherina Right syndrome  . Hydrocodone Nausea Only  . Mobic [Meloxicam]     Same ingredients as lamictal  . Sulfa Antibiotics Hives  . Brintellix [Vortioxetine] Anxiety  . Corticosteroids Rash  . Morphine Palpitations and Other (See Comments)  . Other     "MYCINS" - severe abdominal cramping   . Prednisone Rash       . Prozac [Fluoxetine Hcl] Palpitations   . Zoloft [Sertraline] Anxiety     Review of Systems: General:   No F/C, wt loss Pulm:   No DIB, SOB, pleuritic chest pain Card:  No CP, palpitations Abd:  No n/v/d or pain Ext:  No inc edema from baseline  Objective:  Blood pressure 129/89, pulse 88, temperature 98.3 F (36.8 C), resp. rate 14, weight 159 lb (72.1 kg), SpO2 96 %. Body mass index is 26.26 kg/m. Gen:   Well NAD, A and O *3 HEENT:    [place]/AT, EOMI,  MMM Lungs:   Normal work of breathing. CTA B/L, no Wh, rhonchi Heart:   RRR, S1, S2 WNL's, no MRG Abd:   No gross distention Exts:    warm, pink,  Brisk capillary refill, warm and well perfused.  Psych:    No HI/SI, judgement and insight good, Euthymic mood. Full Affect.   Recent Results (from the past 2160 hour(s))  Vitamin D 25 hydroxy     Status: None   Collection Time: 04/29/19  9:46 AM  Result Value Ref Range   Vit D, 25-Hydroxy 39.2 30.0 - 100.0 ng/mL    Comment: (NOTE) Vitamin D deficiency has been defined by the [place] practice guideline as a level of serum 25-OH vitamin D less than 20 ng/mL (1,2). The Endocrine Society went on to further define vitamin D insufficiency as a level between 21 and 29 ng/mL (2). 1. IOM (Institute of Medicine). 2010. Dietary reference   intakes for calcium and D. [place]: The   [employer]. 2. Holick MF, Binkley Lennox, Bischoff-Ferrari HA, et al.   Evaluation, treatment, and prevention of vitamin D   deficiency: an Endocrine Society clinical practice   guideline. JCEM. 2011 Jul; 96(7):1911-30. Performed At: [facility] [place], [place] [id number] Rush Farmer MD [id number]   [place] ([place] only)     Status: Abnormal   Collection Time: 04/29/19  9:46 AM  Result Value Ref Range   Sodium 141 135 - 145 mmol/L   Potassium 4.0 3.5 - 5.1 mmol/L   Chloride 107 98 - 111 mmol/L   CO2 25 22 - 32 mmol/L   Glucose, Bld 108 (H) 70 - 99 mg/dL    BUN 20 8 - 23 mg/dL   Creatinine 0.82 0.44 - 1.00 mg/dL   Calcium 8.6 (L) 8.9 - 10.3 mg/dL   Total Protein 6.8 6.5 - 8.1 g/dL   Albumin 4.2 3.5 - 5.0 g/dL   AST 19 15 - 41 U/L   ALT 29 0 - 44 U/L   Alkaline Phosphatase 47 38 - 126 U/L   Total Bilirubin 0.5 0.3 - 1.2 mg/dL   GFR, Est Non Af Am >60 >60 mL/min   GFR, Est AFR Am >60 >60 mL/min   Anion gap 9 5 - 15    Comment: Performed at [facility], [address] [place], [place] [address]  Lipid panel     Status: None   Collection Time: 04/29/19  9:46 AM  Result Value Ref Range   Cholesterol 155 0 - 200 mg/dL   Triglycerides 76 <150 mg/dL   HDL 64 >40 mg/dL   Total CHOL/HDL Ratio 2.4 RATIO   VLDL 15 0 - 40 mg/dL   LDL Cholesterol 76 0 - 99 mg/dL    Comment:        Total Cholesterol/HDL:CHD Risk Coronary Heart Disease Risk Table                     Men   Women  1/2 Average Risk   3.4   3.3  Average Risk       5.0   4.4  2  X Average Risk   9.6   7.1  3 X Average Risk  23.4   11.0        Use the calculated Patient Ratio above and the CHD Risk Table to determine the patient's CHD Risk.        ATP III CLASSIFICATION (LDL):  <100     mg/dL   Optimal  100-129  mg/dL   Near or Above                    Optimal  130-159  mg/dL   Borderline  160-189  mg/dL   High  >190     mg/dL   Very High Performed at [facility] [address], [place], [place] [address]   TSH     Status: None   Collection Time: 06/23/19  9:12 AM  Result Value Ref Range   TSH 2.100 0.450 - 4.500 uIU/mL  T3     Status: None   Collection Time: 06/23/19  9:12 AM  Result Value Ref Range   T3, Total 114 71 - 180 ng/dL  T4, free     Status: None   Collection Time: 06/23/19  9:12 AM  Result Value Ref Range   Free T4 1.16 0.82 - 1.77 ng/dL  Hemoglobin A1c     Status: Abnormal   Collection Time: 06/23/19  9:12 AM  Result Value Ref Range   Hgb A1c MFr Bld 5.8 (H) 4.8 - 5.6 %    Comment:          Prediabetes: 5.7 - 6.4           Diabetes: >6.4          Glycemic control for adults with diabetes: <7.0    Est. average glucose Bld gHb Est-mCnc 120 mg/dL  CBC with Differential/Platelet     Status: None   Collection Time: 06/23/19  9:12 AM  Result Value Ref Range   WBC 4.3 3.4 - 10.8 x10E3/uL   RBC 4.42 3.77 - 5.28 x10E6/uL   Hemoglobin 13.9 11.1 - 15.9 g/dL   Hematocrit 41.5 34.0 - 46.6 %   MCV 94 79 - 97 fL   MCH 31.4 26.6 - 33.0 pg   MCHC 33.5 31.5 - 35.7 g/dL   RDW 12.7 11.7 - 15.4 %   Platelets 191 150 - 450 x10E3/uL   Neutrophils 62 Not Estab. %   Lymphs 28 Not Estab. %   Monocytes 7 Not Estab. %   Eos 1 Not Estab. %   Basos 1 Not Estab. %   Neutrophils Absolute 2.7 1.4 - 7.0 x10E3/uL   Lymphocytes Absolute 1.2 0.7 - 3.1 x10E3/uL   Monocytes Absolute 0.3 0.1 - 0.9 x10E3/uL   EOS (ABSOLUTE) 0.1 0.0 - 0.4 x10E3/uL   Basophils Absolute 0.0 0.0 - 0.2 x10E3/uL   Immature Granulocytes 1 Not Estab. %   Immature Grans (Abs) 0.0 0.0 - 0.1 x10E3/uL

## 2019-06-29 ENCOUNTER — Telehealth: Payer: Self-pay

## 2019-06-29 DIAGNOSIS — F5105 Insomnia due to other mental disorder: Secondary | ICD-10-CM

## 2019-06-29 DIAGNOSIS — F331 Major depressive disorder, recurrent, moderate: Secondary | ICD-10-CM

## 2019-06-29 DIAGNOSIS — F411 Generalized anxiety disorder: Secondary | ICD-10-CM

## 2019-06-29 MED ORDER — TEMAZEPAM 30 MG PO CAPS: 30.0000 mg | ORAL_CAPSULE | Freq: Every day | ORAL | 1 refills | Status: DC

## 2019-06-29 MED FILL — TEMAZEPAM 30 MG CAPSULE: 30 | 30 days supply | Qty: 30 | Fill #0

## 2019-06-29 NOTE — Telephone Encounter (Signed)
pt called states she needs a refill on the temazepam 30mg  . pt states that this will be your 1st time refilling rx for her.

## 2019-06-29 NOTE — Telephone Encounter (Signed)
Sent Temazepam to pharmacy. Patient however needs appointment if she continues to need med refills.

## 2019-06-30 ENCOUNTER — Other Ambulatory Visit: Payer: Self-pay

## 2019-06-30 ENCOUNTER — Ambulatory Visit (HOSPITAL_COMMUNITY)
Admission: RE | Admit: 2019-06-30 | Discharge: 2019-06-30 | Disposition: A | Discharge status: Home / Self Care | Payer: 59 | Source: Ambulatory Visit | Attending: Gastroenterology | Admitting: Gastroenterology

## 2019-06-30 ENCOUNTER — Encounter (HOSPITAL_COMMUNITY): Payer: Self-pay

## 2019-06-30 DIAGNOSIS — R1032 Left lower quadrant pain: Secondary | ICD-10-CM | POA: Insufficient documentation

## 2019-06-30 MED ORDER — SODIUM CHLORIDE (PF) 0.9 % IJ SOLN
INTRAMUSCULAR | Status: AC
  Filled 2019-06-30: qty 50

## 2019-06-30 MED ORDER — IOHEXOL 300 MG/ML  SOLN
100.0000 mL | Freq: Once | INTRAMUSCULAR | Status: AC | PRN
  Administered 2019-06-30: 100 mL via INTRAVENOUS

## 2019-07-01 ENCOUNTER — Ambulatory Visit (INDEPENDENT_AMBULATORY_CARE_PROVIDER_SITE_OTHER): Payer: 59 | Admitting: Licensed Clinical Social Worker

## 2019-07-01 DIAGNOSIS — F411 Generalized anxiety disorder: Secondary | ICD-10-CM

## 2019-07-01 DIAGNOSIS — F331 Major depressive disorder, recurrent, moderate: Secondary | ICD-10-CM | POA: Diagnosis not present

## 2019-07-01 DIAGNOSIS — F5105 Insomnia due to other mental disorder: Secondary | ICD-10-CM

## 2019-07-01 NOTE — Progress Notes (Signed)
Virtual Visit via Video Note  I connected with Dawn Booker on 07/01/19 at 11:00 AM EDT by a video enabled telemedicine application and verified that I am speaking with the correct person using two identifiers.   I discussed the limitations of evaluation and management by telemedicine and the availability of in person appointments. The patient expressed understanding and agreed to proceed.  The patient was provided an opportunity to ask questions and all were answered. The patient agreed with the plan and demonstrated an understanding of the instructions.   The patient was advised to call back or seek an in-person evaluation if the symptoms worsen or if the condition fails to improve as anticipated.  I provided 55 minutes of non-face-to-face time during this encounter.   THERAPIST PROGRESS NOTE  Session Time: 11:03 AM to 11:58 AM  Participation Level: Active  Behavioral Response: CasualAlertEuthymic  Type of Therapy: Individual Therapy  Treatment Goals addressed: depression, anxiety, coping, grief  Interventions: CBT, Solution Focused, Strength-based, Supportive, Reframing and Other: coping  Summary: Dawn Booker is a 63 y.o. female who presents with working on herself and working on not worrying when other people think. Reviewed something very helpful recently that is helping her to change her mind about things.  Her daughter went to a wedding and saw a person just like patient with bilateral mastectomy walking around without any thing covering her up, was comfortable with herself.  Shared this was somebody they knew and caused her to think that she might be able to do it too.  Helped her to start changing her mind about things.  Therapist pointed out how these type of things we hear about can help in the change process.  Help her in process of not worrying what how other people think.  Therapist explained she does not want to give power to other people have such an impact on her  life in question whether people who are not significant should have such impact on her life.  Further realize people are not so concerned about her but were concerned about herself and also to have effective situations where she feels evaluated that they have not as significant as she believes that they are.  Therapist provided education on cognitive behavioral therapy and how our thoughts feelings and behaviors are constantly interacting and influencing 1 another.  How we interpret or think about a situation determines how we feel about it which then determines how we react.  Her brains are constantly using thoughts to make interpretations about the world, we can make assumptions, have perceptions that are inaccurate and can be harmful.  People who have depression have higher number of unfounded negative thoughts which we call irrational beliefs.  Introduced 8 colunm thought challenging sheet.  And use an example patient provided of being seen in public after surgery to work through this sheet.  Particularly on thought challenging asking whether thoughts were facts or opinion, what she would tell somebody else, getting the bigger picture.  This assess is helpful for patient to have new ways of looking at situation.  Therapist explained that we experience emotions through cognitions, physical body, through behaviors and that we can interact with any of these channels to modulate feelings.  We can challenge thoughts, we can change behaviors that help change mood.  Patient shared that she loves the thought of challenging her thoughts and found that helpful.  Reviewed an incident which had a big effect on her.  Dr. She work with came  to see her after the surgery and gave her a big hug then when she went back to work she ignored her.  Therapist pointed out cognitive distortion of personalization and other ways to look at it that did not revolve around her.  Patient shared friends of family who did not reach out to  her and patient feels they could have separated dispute with the family and reached out to support her with health issues.  When meeting up with them she thought I am not going to give them satisfaction of seeing me like this.  Reviewed this as positive for patient and her making decisions about what is right for her and labeled this is self-determination to live her life the way she wants.  Also refer to it is setting boundaries for herself after friends had not been there to support her the way she had hoped.  This discussed lowering expectations as helpful for setting boundaries with people especially people have not come through for her. Reviewed session and patient felt helpful to identify working on self determination.  Patient has not made herself a priority and therapist pointed out the importance living her life to make herself a priority.  Patient working on living life the way she wants making choices to get her power back.  Patient reflected on positive aspects of retirement that she can focus on herself husband and daughter and could not do that while working.  Patient shared reflection of thought I need to live. Therapist pointed this out as positive reframing for her life.  Therapist provided strength based on supportive intervention.  Suicidal/Homicidal: No  Plan: Return again in 3-4 weeks.2.  Patient to review 6 column thought challenging sheet.2.  Therapist continue to work with patient on positive concepts of self-determination, also not worrying about what other people think, coping  Diagnosis: Axis I:  Major depressive disorder, recurrent, moderate, generalized anxiety disorder, insomnia due to mental condition   Axis II: No diagnosis    Cordella Register, LCSW 07/01/2019

## 2019-07-02 ENCOUNTER — Ambulatory Visit (HOSPITAL_COMMUNITY): Payer: 59

## 2019-07-09 ENCOUNTER — Telehealth: Payer: Self-pay | Admitting: Hematology and Oncology

## 2019-07-09 NOTE — Telephone Encounter (Signed)
Reviewed the results of MRI back and PET-CT scan No evidence of metastatic disease Patient wants to hold off on Prolia until she sees the next bone density scan in Aug 2021

## 2019-07-14 ENCOUNTER — Other Ambulatory Visit: Payer: Self-pay

## 2019-07-14 ENCOUNTER — Encounter: Payer: Self-pay | Admitting: Psychiatry

## 2019-07-14 ENCOUNTER — Ambulatory Visit (INDEPENDENT_AMBULATORY_CARE_PROVIDER_SITE_OTHER): Payer: 59 | Admitting: Psychiatry

## 2019-07-14 DIAGNOSIS — F5105 Insomnia due to other mental disorder: Secondary | ICD-10-CM | POA: Diagnosis not present

## 2019-07-14 DIAGNOSIS — F411 Generalized anxiety disorder: Secondary | ICD-10-CM | POA: Diagnosis not present

## 2019-07-14 DIAGNOSIS — F331 Major depressive disorder, recurrent, moderate: Secondary | ICD-10-CM | POA: Diagnosis not present

## 2019-07-14 MED ORDER — VILAZODONE HCL 10 MG PO TABS: 10.0000 mg | ORAL_TABLET | Freq: Every day | ORAL | 1 refills | Status: DC

## 2019-07-14 NOTE — Progress Notes (Signed)
Virtual Visit via Video Note  I connected with Dawn Booker on 07/14/19 at  2:15 PM EST by a video enabled telemedicine application and verified that I am speaking with the correct person using two identifiers.   I discussed the limitations of evaluation and management by telemedicine and the availability of in person appointments. The patient expressed understanding and agreed to proceed.     I discussed the assessment and treatment plan with the patient. The patient was provided an opportunity to ask questions and all were answered. The patient agreed with the plan and demonstrated an understanding of the instructions.   The patient was advised to call back or seek an in-person evaluation if the symptoms worsen or if the condition fails to improve as anticipated.  [facility] MD OP Progress Note  07/14/2019 5:18 PM Dawn Booker  MRN:  [id number]  Chief Complaint:  Chief Complaint    Follow-up     HPI: Dawn Booker is a 63 year old Caucasian female, married, currently unemployed, used to work with [employer], [place], has a history of malignant neoplasm of left breast status post mastectomy, chemo radiation therapy currently not on tamoxifen, gastroesophageal reflux disease, hiatal hernia, depression, anxiety, insomnia was evaluated by telemedicine today.  Patient today reports she continues to struggle with depressive symptoms-has tearfulness and sadness often.  She has had good days and bad days.  She also continues to be nervous on and off.  She reports sleep continues to be restless however she has been trying to manage her sleep hygiene.  Patient is currently taking Wellbutrin 150 mg.  There are some days she takes a higher dosage of 300 mg.  Patient denies any suicidality, homicidality or perceptual disturbances.  Patient denies any other concerns today. Visit Diagnosis:    ICD-10-CM   1. MDD (major depressive disorder), recurrent episode, moderate (HCC)  F33.1  Vilazodone HCl (VIIBRYD) 10 MG TABS  2. GAD (generalized anxiety disorder)  F41.1 Vilazodone HCl (VIIBRYD) 10 MG TABS  3. Insomnia due to mental condition  F51.05     Past Psychiatric History: I have reviewed past psychiatric history from my progress note on 05/06/2019.  Past trials of Prozac, Zoloft, Celexa, Lexapro, Effexor, Cymbalta-noncompliant, Ambien, Lunesta, Brintellix, temazepam, Wellbutrin, lorazepam, Xanax  Past Medical History:  Past Medical History:  Diagnosis Date  . ADD (attention deficit disorder)   . Anxiety   . Cancer [facility])    breast cancer  . Depression   . GERD (gastroesophageal reflux disease)   . Hiatal hernia   . History of radiation therapy 08/13/2018- 09/25/2018   Left Chest wall and IM nodes/ 50 Gy in 25 fractions, Left supraclaicular fossa and PAB/ 50 Gy in 25 fractions, Left chest wall scar boost/ 10 Gy in 5 fractions.   . Hyperlipemia, mixed   . Intermittent palpitations   . Malignant neoplasm of overlapping sites of left breast in female, estrogen receptor positive ([facility]) 05/06/2018   oncologist-  dr Lindi Adie--  Invasive Lobular Cancer, CIS, Stage IB, Grade 2 (pT3,pN1a,cM0), ER+---  s/p  left mastectomy with sln dissections and right mastectomy (benign)  . PONV (postoperative nausea and vomiting)   . Right thyroid nodule   . Shingles 12/2017    Past Surgical History:  Procedure Laterality Date  . AXILLARY LYMPH NODE DISSECTION Left 07/09/2018   Procedure: COMPLETION OF LEFT AXILLARY LYMPH NODE DISSECTION;  Surgeon: Jovita Kussmaul, MD;  Location: [facility];  Service: General;  Laterality: Left;  . BREAST EXCISIONAL  BIOPSY Left 1995   benign  . CARDIOVASCULAR STRESS TEST  06/21/2017   normal nuclear stress study w/ no ischemia/  normal LV function and wall function, nuclear stress ef 70%  . ELBOW SURGERY Left child  . MASTECTOMY W/ SENTINEL NODE BIOPSY Left 06/13/2018  . MASTECTOMY WITH RADIOACTIVE SEED GUIDED EXCISION AND AXILLARY SENTINEL LYMPH NODE BIOPSY  Bilateral 06/13/2018   Procedure: LEFT MASTECTOMY WITH SENTINEL LYMPH NODE MAPPING AND TARGETED NODE DISSECTION AND RIGHT PROPHYLATIC MASTECTOMY;  Surgeon: Jovita Kussmaul, MD;  Location: [facility];  Service: General;  Laterality: Bilateral;  . THYROIDECTOMY Left 10-31-2001   dr gerkin @[facility]   . TRANSTHORACIC ECHOCARDIOGRAM  06/21/2017   ef 60-65%/  trivial MR (no evidence mvp)  . TUBAL LIGATION  yrs ago  . VAGINAL HYSTERECTOMY  1990s    Family Psychiatric History: Reviewed family psychiatric history from my progress note on 05/06/2019  Family History:  Family History  Problem Relation Age of Onset  . Hypertension Mother   . Heart attack Mother   . Breast cancer Mother   . Bipolar disorder Father   . Breast cancer Maternal Grandmother   . Breast cancer Cousin     Social History: Reviewed social history from my progress note on 05/06/2019 Social History   Socioeconomic History  . Marital status: Married    Spouse name: Not on file  . Number of children: 2  . Years of education: 53  . Highest education level: High school graduate  Occupational History  . Not on file  Social Needs  . Financial resource strain: Not on file  . Food insecurity    Worry: Not on file    Inability: Not on file  . Transportation needs    Medical: No    Non-medical: No  Tobacco Use  . Smoking status: Former Smoker    Packs/day: 0.25    Years: 0.00    Pack years: 0.00    Types: Cigarettes    Quit date: 12/05/2017    Years since quitting: 1.6  . Smokeless tobacco: Never Used  Substance and Sexual Activity  . Alcohol use: Not Currently  . Drug use: No  . Sexual activity: Yes    Partners: Male    Birth control/protection: None  Lifestyle  . Physical activity    Days per week: Not on file    Minutes per session: Not on file  . Stress: Not on file  Relationships  . Social Herbalist on phone: Not on file    Gets together: Not on file    Attends religious service: Not on file    Active  member of club or organization: Not on file    Attends meetings of clubs or organizations: Not on file    Relationship status: Not on file  Other Topics Concern  . Not on file  Social History Narrative   Lives at home with husband.   Right-handed.   1 cup coffee per day, occasional soda or tea.    Allergies:  Allergies  Allergen Reactions  . Lamictal [Lamotrigine] Other (See Comments)    Dawn Booker  . Cymbalta [Duloxetine Hcl]     Dawn Booker  . Hydrocodone Nausea Only  . Mobic [Meloxicam]     Same ingredients as lamictal  . Sulfa Antibiotics Hives  . Brintellix [Vortioxetine] Anxiety  . Corticosteroids Rash  . Morphine Palpitations and Other (See Comments)  . Other     "MYCINS" - severe abdominal  cramping   . Prednisone Rash       . Prozac [Fluoxetine Hcl] Palpitations  . Zoloft [Sertraline] Anxiety    Metabolic Disorder Labs: Lab Results  Component Value Date   HGBA1C 5.8 (H) 06/23/2019   No results found for: PROLACTIN Lab Results  Component Value Date   CHOL 155 04/29/2019   TRIG 76 04/29/2019   HDL 64 04/29/2019   CHOLHDL 2.4 04/29/2019   VLDL 15 04/29/2019   LDLCALC 76 04/29/2019   LDLCALC 59 03/05/2018   Lab Results  Component Value Date   TSH 2.100 06/23/2019   TSH 1.58 09/24/2018    Therapeutic Level Labs: No results found for: LITHIUM No results found for: VALPROATE No components found for:  CBMZ  Current Medications: Current Outpatient Medications  Medication Sig Dispense Refill  . acetaminophen (TYLENOL) 500 MG tablet Take 1,000 mg by mouth at bedtime.     Marland Kitchen buPROPion (WELLBUTRIN XL) 150 MG 24 hr tablet Take 150 mg by mouth daily.    . Cholecalciferol (VITAMIN D) 2000 units CAPS Take 2,000 Units by mouth daily.     . diphenhydrAMINE (BENADRYL) 25 mg capsule Take 25 mg by mouth at bedtime.    . folic acid (FOLVITE) A999333 MCG tablet Take 400 mcg by mouth daily.    . Magnesium 400 MG TABS Take 400 mg by mouth.    Marland Kitchen  omeprazole (PRILOSEC) 40 MG capsule Take 1 capsule (40 mg total) by mouth daily. Need appt b-4 RF 90 capsule 0  . OVER THE COUNTER MEDICATION 2 tablets daily. Vegan Calcium 500    . rosuvastatin (CRESTOR) 10 MG tablet Take 1 tablet (10 mg total) by mouth at bedtime. 90 tablet 1  . temazepam (RESTORIL) 30 MG capsule Take 1 capsule (30 mg total) by mouth at bedtime. 30 capsule 1  . Vitamin D, Ergocalciferol, (DRISDOL) 1.25 MG (50000 UT) CAPS capsule Take 1 capsule (50,000 Units total) by mouth every 7 (seven) days. 12 capsule 3  . denosumab (PROLIA) 60 MG/ML SOSY injection Inject 60 mg into the skin once.    Marland Kitchen LORazepam (ATIVAN) 0.5 MG tablet Take 1 tablet (0.5 mg total) by mouth every 8 (eight) hours as needed. for anxiety (Patient not taking: Reported on 07/14/2019) 30 tablet 1  . Vilazodone HCl (VIIBRYD) 10 MG TABS Take 1 tablet (10 mg total) by mouth daily. 30 tablet 1   No current facility-administered medications for this visit.      Musculoskeletal: Strength & Muscle Tone: UTA Gait & Station: Observed as seated Patient leans: N/A  Psychiatric Specialty Exam: Review of Systems  Psychiatric/Behavioral: Positive for depression. The patient is nervous/anxious and has insomnia.   All other systems reviewed and are negative.   There were no vitals taken for this visit.There is no height or weight on file to calculate BMI.  General Appearance: Casual  Eye Contact:  Fair  Speech:  Normal Rate  Volume:  Normal  Mood:  Anxious and Depressed  Affect:  Congruent  Thought Process:  Goal Directed and Descriptions of Associations: Intact  Orientation:  Full (Time, Place, and Person)  Thought Content: Logical   Suicidal Thoughts:  No  Homicidal Thoughts:  No  Memory:  Immediate;   Fair Recent;   Fair Remote;   Fair  Judgement:  Fair  Insight:  Fair  Psychomotor Activity:  Normal  Concentration:  Concentration: Fair and Attention Span: Fair  Recall:  [employer] of Knowledge: Fair   Language: Fair  Akathisia:  No  Handed:  Right  AIMS (if indicated): Denies tremors, rigidity, stiffness  Assets:  Communication Skills Desire for Improvement Housing Social Support  ADL's:  Intact  Cognition: WNL  Sleep:  restless   Screenings: GAD-7     Office Visit from 05/06/2019 in [place]  Total GAD-7 Score  11    PHQ2-9     Office Visit from 06/26/2019 in [place] at [facility] Visit from 06/09/2019 in [place] at [facility] Visit from 05/06/2019 in [place]  PHQ-2 Total Score  2  2  3   PHQ-9 Total Score  9  10  17        Assessment and Plan: Jayni is a 63 year old Caucasian female, married, unemployed, has a history of breast cancer status post chemoradiation and mastectomy, gastroesophageal reflux disease, depression, anxiety, insomnia was evaluated by telemedicine today.  Patient is biologically predisposed given her family history, history of trauma.  She also has psychosocial stressors of relationship struggles, cancer diagnosis.  Patient is currently making some progress on the current medication regimen however will continue to benefit from medication readjustment.  Patient had GeneSight testing done.  Plan MDD-unstable Wellbutrin XL 150 mg p.o. daily. Start Viibryd 10 mg p.o. daily Continue CBT with Ms. Cordella Register  GAD-some progress Continue CBT  Insomnia-improving Temazepam 30 mg p.o. nightly Discussed sleep hygiene techniques. Advised patient not to combine temazepam with lorazepam.  Some time was spent reviewing and discussing GeneSight testing results with patient today.  Patient advised to keep the Wellbutrin at a low dosage of 150 mg based on genetic testing.  Reviewed TSH-dated 06/23/2019-2.100-within normal limits  Patient advised to continue psychotherapy sessions.  Follow-up in clinic in 4 weeks or sooner if needed.  December 7 at 3  PM  I have spent atleast 25 minutes non face to face with patient today. More than 50 % of the time was spent for psychoeducation and supportive psychotherapy and care coordination. This note was generated in part or whole with voice recognition software. Voice recognition is usually quite accurate but there are transcription errors that can and very often do occur. I apologize for any typographical errors that were not detected and corrected.         Ursula Alert, MD 07/14/2019, 5:18 PM

## 2019-07-15 MED FILL — VIIBRYD 10 MG TABLET: 10 | 30 days supply | Qty: 30 | Fill #0

## 2019-07-20 ENCOUNTER — Telehealth: Payer: Self-pay

## 2019-07-20 NOTE — Telephone Encounter (Signed)
pt states she having a havign something done at the [facility]ital this friday so she will start taking the medication on monday.

## 2019-07-20 NOTE — Telephone Encounter (Signed)
Ok

## 2019-07-24 DIAGNOSIS — K5140 Inflammatory polyps of colon without complications: Secondary | ICD-10-CM | POA: Diagnosis not present

## 2019-07-24 DIAGNOSIS — Z1211 Encounter for screening for malignant neoplasm of colon: Secondary | ICD-10-CM | POA: Diagnosis not present

## 2019-07-24 DIAGNOSIS — D128 Benign neoplasm of rectum: Secondary | ICD-10-CM | POA: Diagnosis not present

## 2019-07-24 DIAGNOSIS — R1032 Left lower quadrant pain: Secondary | ICD-10-CM | POA: Diagnosis not present

## 2019-07-24 DIAGNOSIS — K621 Rectal polyp: Secondary | ICD-10-CM | POA: Diagnosis not present

## 2019-07-24 LAB — HM COLONOSCOPY

## 2019-07-26 MED FILL — TEMAZEPAM 30 MG CAPSULE: 30 | 30 days supply | Qty: 30 | Fill #1

## 2019-07-28 ENCOUNTER — Ambulatory Visit (INDEPENDENT_AMBULATORY_CARE_PROVIDER_SITE_OTHER): Payer: 59 | Admitting: Licensed Clinical Social Worker

## 2019-07-28 DIAGNOSIS — F331 Major depressive disorder, recurrent, moderate: Secondary | ICD-10-CM

## 2019-07-28 DIAGNOSIS — F5105 Insomnia due to other mental disorder: Secondary | ICD-10-CM | POA: Diagnosis not present

## 2019-07-28 DIAGNOSIS — F411 Generalized anxiety disorder: Secondary | ICD-10-CM | POA: Diagnosis not present

## 2019-07-28 NOTE — Progress Notes (Signed)
Virtual Visit via Video Note  I connected with Dawn Booker on 07/28/19 at 10:00 AM EST by a video enabled telemedicine application and verified that I am speaking with the correct person using two identifiers.   I discussed the limitations of evaluation and management by telemedicine and the availability of in person appointments. The patient expressed understanding and agreed to proceed.  I discussed the assessment and treatment plan with the patient. The patient was provided an opportunity to ask questions and all were answered. The patient agreed with the plan and demonstrated an understanding of the instructions.   The patient was advised to call back or seek an in-person evaluation if the symptoms worsen or if the condition fails to improve as anticipated.  I provided 30 minutes of non-face-to-face time during this encounter.  THERAPIST PROGRESS NOTE  Session Time: 10:00 AM to 10:30 AM  Participation Level: Active  Behavioral Response: CasualAlertEuthymic  Type of Therapy: Individual Therapy  Treatment Goals addressed:  depression, anxiety, coping, grief Interventions: Solution Focused, Strength-based, Supportive and Other: coping  Summary: Dawn Booker is a 63 y.o. female who presents with better, issue with son the biggest problem and now resolved so a lot better. Right now Covid but look forward to spending time with his family. Reviewed conflict with son and shares that once settled down I think we will talk about it. I don't think he is  interested in talking about it right now but happy to have his family. Was conflict between son and dad. Patient was drag into the middle. I am good, I am satisfied. I am happy and getting a routine. Cleaning house out. It is not bad just talking care of mom didn't take care of house. Taking steps forward in adjusting to the new norm.  Looking at things positively with retirement, meant to be. Reviewed talking about self-determination and  patient relates reviews conversation she had with therapist and thinks about having "control of myself and do right for me. I keep it in mind in talking and making decisions." Explains she is used to helping people and learn to say no and think about what is best for. Her. Therapist reviewed insight talked about not worrying about what people think that their opinion is not going to matter much for the most part. Has a bucket list has grandchildren who will do it with me. Anxiety and depression better enough progress for termination. Doctor started her on new medication, does not want to start medication before holidays. I am terrified of taking it, don't want to ruin Thanksgiving maybe too strong. I told the doctor I will try it so we will start after the holiday.  Looking forward to Thanksgiving  Suicidal/Homicidal: No  Therapist Response: Therapist assessed progress and assessed patient making significant progress since last session.  Reviewed mending of relationship with son is a big part of improvement in symptoms.  Patient also coming to terms and in recognizing with her medical issues that it is time for her to stop working, not having to struggle with that question is helpful for her mental health.  Patient also has gained insight about self determination, to make choices that consider her best interest as well important for her mental health.  Related insight of not living for other people, getting freedom from there judgments helps her to live her life fully also to recognize how little impact they have in her life so not to take as such a significant factor in  how she looks at things.  Reviewed insight that will help with her quality of life of enjoying time now that she is retired and taking steps to do things on her bucket list.  Plan: 1.Patient reports improvement in symptoms and has shown good progress so patient terminated from therapy. She is advised to return if needed for worsening of  symptoms. 2.Patient continue to aply insights and coping skills learned in therapy to manage symptoms and life stressors.   Diagnosis: Axis I: Major depressive disorder, recurrent, moderate, generalized anxiety disorder, insomnia due to mental condition   Axis II: No diagnosis    Cordella Register, LCSW 07/28/2019

## 2019-07-31 ENCOUNTER — Ambulatory Visit: Payer: 59 | Admitting: Hematology and Oncology

## 2019-08-03 NOTE — Progress Notes (Signed)
HEMATOLOGY-ONCOLOGY DOXIMITY VISIT PROGRESS NOTE  I connected with Dawn Booker on 08/04/2019 at 11:00 AM EST by Doximity video conference and verified that I am speaking with the correct person using two identifiers.  I discussed the limitations, risks, security and privacy concerns of performing an evaluation and management service by Doximity and the availability of in person appointments.  I also discussed with the patient that there may be a patient responsible charge related to this service. The patient expressed understanding and agreed to proceed.  Patient's Location: Home Physician Location: Clinic  CHIEF COMPLIANT: Follow-up of left breast cancer on anastrozole.   INTERVAL HISTORY: Dawn Booker is a 63 y.o. female with above-mentioned history of left breast cancer who underwent bilateral mastectomies and radiation, and is currently on anti-estrogen therapy with anastrozole as she could not tolerate tamoxifen. She presents over Doximity today for follow-up.   Oncology History  Malignant neoplasm of overlapping sites of left breast in female, estrogen receptor positive ([facility])  07/2015 Miscellaneous   Genetics negative.  Genes tested: APC, ATM, BARD1, BMPR1A, BRCA1, BRCA2, BRIP1, CDH1, CDK4, CDKN2A, CHEK2, EPCAM, GREM1, MLH1, MSH2, MSH6, MUTYH, PALB2, PMS2, POLD1, POLE, PTEN, RAD51C, RAD51D, RET, SMAD4, STK11, and TP53.      05/06/2018 Initial Diagnosis   Screening detected left breast mass anteriorly 1030 to 11 o'clock position 2 masses 6 mm and 7 mm, 12:30 position 2.6 cm both of these masses biopsy-proven grade 2 invasive lobular cancer ER 100%, PR 10%, Ki-67 15%, HER-2 negative 1+ by IHC, lymph node positive for malignancy T2N1 stage IIa clinical stage   05/21/2018 Cancer Staging   Staging form: Breast, AJCC 8th Edition - Pathologic: Stage IB (pT3, pN1a, cM0, G2, ER+, PR+, HER2-) - Signed by Nicholas Lose, MD on 06/24/2018   06/13/2018 Surgery   Bilateral mastectomies: Left:  ILC, grade 2, 5.4 cm, LCIS, margins negative, lymphovascular invasion present, 2/7 lymph nodes positive with extranodal extension (1 additional lymph node had isolated tumor cells), right mastectomy benign; T3N1a ER 100%, PR 10%, HER-2 -1+, Ki-67 10 to 15%, stage Ib   08/06/2018 - 09/25/2018 Radiation Therapy   Adjuvant XRT   10/09/2018 -  Anti-estrogen oral therapy   Anastrozole 31m daily, plan for 7 years; switched to tamoxifen 11/07/18 for bone health     REVIEW OF SYSTEMS:   Constitutional: Denies fevers, chills or abnormal weight loss Eyes: Denies blurriness of vision Ears, nose, mouth, throat, and face: Denies mucositis or sore throat Respiratory: Denies cough, dyspnea or wheezes Cardiovascular: Denies palpitation, chest discomfort Gastrointestinal:  Denies nausea, heartburn or change in bowel habits Skin: Denies abnormal skin rashes Lymphatics: Denies new lymphadenopathy or easy bruising Neurological:Denies numbness, tingling or new weaknesses Behavioral/Psych: Mood is stable, no new changes  Extremities: No lower extremity edema Breast: denies any pain or lumps or nodules in either breasts All other systems were reviewed with the patient and are negative.  Observations/Objective:  There were no vitals filed for this visit. There is no height or weight on file to calculate BMI.  I have reviewed the data as listed CMP Latest Ref Rng & Units 04/29/2019 07/04/2018 06/06/2018  Glucose 70 - 99 mg/dL 108(H) 92 79  BUN 8 - 23 mg/dL _0 Creatinine 0.44 - 1.00 mg/dL 0.82 0.76 0.76  Sodium 135 - 145 mmol/L 141 140 139  Potassium 3.5 - 5.1 mmol/L 4.0 3.5 3.6  Chloride 98 - 111 mmol/L 107 109 107  CO2 22 - 32 mmol/L 25 26 26  Calcium 8.9 - 10.3 mg/dL 8.6(L) 8.8(L) 9.2  Total Protein 6.5 - 8.1 g/dL 6.8 - -  Total Bilirubin 0.3 - 1.2 mg/dL 0.5 - -  Alkaline Phos 38 - 126 U/L 47 - -  AST 15 - 41 U/L 19 - -  ALT 0 - 44 U/L 29 - -    Lab Results  Component Value Date   WBC 4.3  06/23/2019   HGB 13.9 06/23/2019   HCT 41.5 06/23/2019   MCV 94 06/23/2019   PLT 191 06/23/2019   NEUTROABS 2.7 06/23/2019      Assessment Plan:  Malignant neoplasm of overlapping sites of left breast in female, estrogen receptor positive ([place]) 06/13/2018:Bilateral mastectomies: Left: ILC, grade 2, 5.4 cm, LCIS, margins negative, lymphovascular invasion present, 2/7 lymph nodes positive with extranodal extension (1 additional lymph node had isolated tumor cells), right mastectomy benign; T3N1a ER 100%, PR 10%, HER-2 -1+, Ki-67 10 to 15%, stage Ib MammaPrint: Low risk, luminal type a Adjuvant radiation therapy: 08/14/2018- MRI back: L4 and L5 small lesions could not rule out metastatic disease. PET CT negative for bone metastases.  Treatment: Tamoxifen 20 mg daily(patient did not want to take anastrozole because of osteoporosis.)Started 11/07/2018-stopped 01/01/2019 due to profound depression/anxiety Couldn't tolerate Anastrozole  10/28/2018 PET CT scan: No evidence of metastatic disease, no hypermetabolism at L4-L5 vertebral bodies to correspond to the MRI findings.  CT abdomen 06/30/2019: No findings to suggest the cause of abdominal discomfort.  No metastatic disease Osteoporosis: T score -3.1  No role of mammogram since she had bilateral mastectomies Return to clinic in 1 year for follow-up   I discussed the assessment and treatment plan with the patient. The patient was provided an opportunity to ask questions and all were answered. The patient agreed with the plan and demonstrated an understanding of the instructions. The patient was advised to call back or seek an in-person evaluation if the symptoms worsen or if the condition fails to improve as anticipated.   I provided 15 minutes of face-to-face Doximity time during this encounter.    Rulon Eisenmenger, MD 08/04/2019   I, Molly Dorshimer, am acting as scribe for Nicholas Lose, MD.  I have reviewed the above documentation  for accuracy and completeness, and I agree with the above.

## 2019-08-04 ENCOUNTER — Inpatient Hospital Stay: Payer: 59 | Attending: Hematology and Oncology | Admitting: Hematology and Oncology

## 2019-08-04 DIAGNOSIS — C50812 Malignant neoplasm of overlapping sites of left female breast: Secondary | ICD-10-CM | POA: Diagnosis not present

## 2019-08-04 DIAGNOSIS — Z79811 Long term (current) use of aromatase inhibitors: Secondary | ICD-10-CM | POA: Insufficient documentation

## 2019-08-04 DIAGNOSIS — Z170 Estrogen receptor positive status [ER+]: Secondary | ICD-10-CM | POA: Insufficient documentation

## 2019-08-04 DIAGNOSIS — Z923 Personal history of irradiation: Secondary | ICD-10-CM | POA: Insufficient documentation

## 2019-08-04 DIAGNOSIS — Z9013 Acquired absence of bilateral breasts and nipples: Secondary | ICD-10-CM | POA: Insufficient documentation

## 2019-08-04 NOTE — Assessment & Plan Note (Signed)
06/13/2018:Bilateral mastectomies: Left: ILC, grade 2, 5.4 cm, LCIS, margins negative, lymphovascular invasion present, 2/7 lymph nodes positive with extranodal extension (1 additional lymph node had isolated tumor cells), right mastectomy benign; T3N1a ER 100%, PR 10%, HER-2 -1+, Ki-67 10 to 15%, stage Ib MammaPrint: Low risk, luminal type a Adjuvant radiation therapy: 08/14/2018- MRI back: L4 and L5 small lesions could not rule out metastatic disease. PET CT negative for bone metastases.  Treatment: Tamoxifen 20 mg daily(patient did not want to take anastrozole because of osteoporosis.)Started 11/07/2018-stopped 01/01/2019 due to profound depression/anxiety Started anastrozole 03/04/2019  10/28/2018 PET CT scan: No evidence of metastatic disease, no hypermetabolism at L4-L5 vertebral bodies to correspond to the MRI findings.  CT abdomen 06/30/2019: No findings to suggest the cause of abdominal discomfort.  No metastatic disease  Anastrozole toxicities:  Osteoporosis: T score -3.1 No role of mammogram since she had bilateral mastectomies Return to clinic in 1 year for follow-up

## 2019-08-05 ENCOUNTER — Telehealth: Payer: Self-pay | Admitting: Hematology and Oncology

## 2019-08-05 NOTE — Telephone Encounter (Signed)
I left a message regarding schedule  

## 2019-08-10 ENCOUNTER — Other Ambulatory Visit: Payer: Self-pay

## 2019-08-10 ENCOUNTER — Encounter: Payer: Self-pay | Admitting: Psychiatry

## 2019-08-10 ENCOUNTER — Ambulatory Visit (INDEPENDENT_AMBULATORY_CARE_PROVIDER_SITE_OTHER): Payer: 59 | Admitting: Psychiatry

## 2019-08-10 DIAGNOSIS — F5105 Insomnia due to other mental disorder: Secondary | ICD-10-CM | POA: Diagnosis not present

## 2019-08-10 DIAGNOSIS — F331 Major depressive disorder, recurrent, moderate: Secondary | ICD-10-CM | POA: Diagnosis not present

## 2019-08-10 DIAGNOSIS — F411 Generalized anxiety disorder: Secondary | ICD-10-CM | POA: Diagnosis not present

## 2019-08-10 NOTE — Progress Notes (Signed)
Virtual Visit via Video Note  I connected with Dawn Booker on 08/10/19 at  3:00 PM EST by a video enabled telemedicine application and verified that I am speaking with the correct person using two identifiers.   I discussed the limitations of evaluation and management by telemedicine and the availability of in person appointments. The patient expressed understanding and agreed to proceed.    I discussed the assessment and treatment plan with the patient. The patient was provided an opportunity to ask questions and all were answered. The patient agreed with the plan and demonstrated an understanding of the instructions.   The patient was advised to call back or seek an in-person evaluation if the symptoms worsen or if the condition fails to improve as anticipated.  Leona MD OP Progress Note  08/10/2019 3:21 PM Dawn Booker  MRN:  [id number]  Chief Complaint:  Chief Complaint    Follow-up     HPI: Dawn Booker is a 63 year old Caucasian female, married, currently unemployed, used to work with [employer] health in the past, [place], has a history of malignant neoplasm of left breast status post mastectomy, chemo radiation therapy, currently not on tamoxifen, gastroesophageal reflux disease, hiatal hernia, depression, anxiety and insomnia was evaluated by telemedicine today.  Patient today reports she started the [place] yesterday.  She reports she did notice some GI side effects like abdominal pain however she has been taking it with snack and that has helped.  She denies any other side effects.  She reports she continues to struggle with racing thoughts at night.  The temazepam does help her to sleep for at least 4 hours.  She does not want to change her sleep medication at this time.  She has tried medications like Ambien in the past which helped however she had sleepwalking on the same.  She does not want to be on any kind of medications that can cause that.  Patient denies any  suicidality, homicidality or perceptual disturbances.  She reports she is not seeing her therapist anymore since she felt like she got everything that she can from therapy.  Some time was spent providing education about sleep hygiene, meditation and body scan meditation techniques for sleep.  She will try to work on that.  Patient denies any other concerns today. Visit Diagnosis:    ICD-10-CM   1. MDD (major depressive disorder), recurrent episode, moderate (HCC)  F33.1   2. GAD (generalized anxiety disorder)  F41.1   3. Insomnia due to mental condition  F51.05     Past Psychiatric History: I have reviewed past psychiatric history from my progress note on 05/06/2019.  Past trials of Prozac, Zoloft, Celexa, Lexapro, Effexor, Cymbalta-noncompliant, Ambien, Lunesta, Brintellix, temazepam, Wellbutrin, lorazepam, Xanax.  Past Medical History:  Past Medical History:  Diagnosis Date  . ADD (attention deficit disorder)   . Anxiety   . Cancer [facility])    breast cancer  . Depression   . GERD (gastroesophageal reflux disease)   . Hiatal hernia   . History of radiation therapy 08/13/2018- 09/25/2018   Left Chest wall and IM nodes/ 50 Gy in 25 fractions, Left supraclaicular fossa and PAB/ 50 Gy in 25 fractions, Left chest wall scar boost/ 10 Gy in 5 fractions.   . Hyperlipemia, mixed   . Intermittent palpitations   . Malignant neoplasm of overlapping sites of left breast in female, estrogen receptor positive ([place]) 05/06/2018   oncologist-  dr Lindi Adie--  Invasive Lobular Cancer, CIS, Stage IB,  Grade 2 (pT3,pN1a,cM0), ER+---  s/p  left mastectomy with sln dissections and right mastectomy (benign)  . PONV (postoperative nausea and vomiting)   . Right thyroid nodule   . Shingles 12/2017    Past Surgical History:  Procedure Laterality Date  . AXILLARY LYMPH NODE DISSECTION Left 07/09/2018   Procedure: COMPLETION OF LEFT AXILLARY LYMPH NODE DISSECTION;  Surgeon: Jovita Kussmaul, MD;  Location: [facility];   Service: General;  Laterality: Left;  . BREAST EXCISIONAL BIOPSY Left 1995   benign  . CARDIOVASCULAR STRESS TEST  06/21/2017   normal nuclear stress study w/ no ischemia/  normal LV function and wall function, nuclear stress ef 70%  . ELBOW SURGERY Left child  . MASTECTOMY W/ SENTINEL NODE BIOPSY Left 06/13/2018  . MASTECTOMY WITH RADIOACTIVE SEED GUIDED EXCISION AND AXILLARY SENTINEL LYMPH NODE BIOPSY Bilateral 06/13/2018   Procedure: LEFT MASTECTOMY WITH SENTINEL LYMPH NODE MAPPING AND TARGETED NODE DISSECTION AND RIGHT PROPHYLATIC MASTECTOMY;  Surgeon: Jovita Kussmaul, MD;  Location: [place];  Service: General;  Laterality: Bilateral;  . THYROIDECTOMY Left 10-31-2001   dr gerkin @[facility]   . TRANSTHORACIC ECHOCARDIOGRAM  06/21/2017   ef 60-65%/  trivial MR (no evidence mvp)  . TUBAL LIGATION  yrs ago  . VAGINAL HYSTERECTOMY  1990s    Family Psychiatric History: Reviewed family psychiatric history from my progress note on 05/06/2019.  Family History:  Family History  Problem Relation Age of Onset  . Hypertension Mother   . Heart attack Mother   . Breast cancer Mother   . Bipolar disorder Father   . Breast cancer Maternal Grandmother   . Breast cancer Cousin     Social History: Reviewed social history from my progress note on 05/06/2019. Social History   Socioeconomic History  . Marital status: Married    Spouse name: Not on file  . Number of children: 2  . Years of education: 36  . Highest education level: High school graduate  Occupational History  . Not on file  Social Needs  . Financial resource strain: Not on file  . Food insecurity    Worry: Not on file    Inability: Not on file  . Transportation needs    Medical: No    Non-medical: No  Tobacco Use  . Smoking status: Former Smoker    Packs/day: 0.25    Years: 0.00    Pack years: 0.00    Types: Cigarettes    Quit date: 12/05/2017    Years since quitting: 1.6  . Smokeless tobacco: Never Used  Substance and Sexual  Activity  . Alcohol use: Not Currently  . Drug use: No  . Sexual activity: Yes    Partners: Male    Birth control/protection: None  Lifestyle  . Physical activity    Days per week: Not on file    Minutes per session: Not on file  . Stress: Not on file  Relationships  . Social Herbalist on phone: Not on file    Gets together: Not on file    Attends religious service: Not on file    Active member of club or organization: Not on file    Attends meetings of clubs or organizations: Not on file    Relationship status: Not on file  Other Topics Concern  . Not on file  Social History Narrative   Lives at home with husband.   Right-handed.   1 cup coffee per day, occasional soda or tea.  Allergies:  Allergies  Allergen Reactions  . Lamictal [Lamotrigine] Other (See Comments)    Katherina Right Syndrome  . Cymbalta [Duloxetine Hcl]     Katherina Right syndrome  . Hydrocodone Nausea Only  . Mobic [Meloxicam]     Same ingredients as lamictal  . Sulfa Antibiotics Hives  . Brintellix [Vortioxetine] Anxiety  . Corticosteroids Rash  . Morphine Palpitations and Other (See Comments)  . Other     "MYCINS" - severe abdominal cramping   . Prednisone Rash       . Prozac [Fluoxetine Hcl] Palpitations  . Zoloft [Sertraline] Anxiety    Metabolic Disorder Labs: Lab Results  Component Value Date   HGBA1C 5.8 (H) 06/23/2019   No results found for: PROLACTIN Lab Results  Component Value Date   CHOL 155 04/29/2019   TRIG 76 04/29/2019   HDL 64 04/29/2019   CHOLHDL 2.4 04/29/2019   VLDL 15 04/29/2019   LDLCALC 76 04/29/2019   LDLCALC 59 03/05/2018   Lab Results  Component Value Date   TSH 2.100 06/23/2019   TSH 1.58 09/24/2018    Therapeutic Level Labs: No results found for: LITHIUM No results found for: VALPROATE No components found for:  CBMZ  Current Medications: Current Outpatient Medications  Medication Sig Dispense Refill  . acetaminophen (TYLENOL)  500 MG tablet Take 1,000 mg by mouth at bedtime.     Marland Kitchen buPROPion (WELLBUTRIN XL) 150 MG 24 hr tablet Take 150 mg by mouth daily.    . Cholecalciferol (VITAMIN D) 2000 units CAPS Take 2,000 Units by mouth daily.     Marland Kitchen denosumab (PROLIA) 60 MG/ML SOSY injection Inject 60 mg into the skin once.    . diphenhydrAMINE (BENADRYL) 25 mg capsule Take 25 mg by mouth at bedtime.    . folic acid (FOLVITE) A999333 MCG tablet Take 400 mcg by mouth daily.    Marland Kitchen LORazepam (ATIVAN) 0.5 MG tablet Take 1 tablet (0.5 mg total) by mouth every 8 (eight) hours as needed. for anxiety (Patient not taking: Reported on 07/14/2019) 30 tablet 1  . Magnesium 400 MG TABS Take 400 mg by mouth.    Marland Kitchen omeprazole (PRILOSEC) 40 MG capsule Take 1 capsule (40 mg total) by mouth daily. Need appt b-4 RF 90 capsule 0  . OVER THE COUNTER MEDICATION 2 tablets daily. Vegan Calcium 500    . PEG 3350-KCl-NaBcb-NaCl-NaSulf (PEG-3350/ELECTROLYTES) 236 g SOLR     . rosuvastatin (CRESTOR) 10 MG tablet Take 1 tablet (10 mg total) by mouth at bedtime. 90 tablet 1  . temazepam (RESTORIL) 30 MG capsule Take 1 capsule (30 mg total) by mouth at bedtime. 30 capsule 1  . Vilazodone HCl (VIIBRYD) 10 MG TABS Take 1 tablet (10 mg total) by mouth daily. 30 tablet 1  . Vitamin D, Ergocalciferol, (DRISDOL) 1.25 MG (50000 UT) CAPS capsule Take 1 capsule (50,000 Units total) by mouth every 7 (seven) days. 12 capsule 3   No current facility-administered medications for this visit.      Musculoskeletal: Strength & Muscle Tone: UTA Gait & Station: normal Patient leans: N/A  Psychiatric Specialty Exam: Review of Systems  Psychiatric/Behavioral: Positive for depression. The patient is nervous/anxious and has insomnia.   All other systems reviewed and are negative.   There were no vitals taken for this visit.There is no height or weight on file to calculate BMI.  General Appearance: Casual  Eye Contact:  Fair  Speech:  Clear and Coherent  Volume:  Normal   Mood:  Anxious and Depressed  Affect:  Non-Congruent  Thought Process:  Goal Directed and Descriptions of Associations: Intact  Orientation:  Full (Time, Place, and Person)  Thought Content: Logical   Suicidal Thoughts:  No  Homicidal Thoughts:  No  Memory:  Immediate;   Fair Recent;   Fair Remote;   Fair  Judgement:  Fair  Insight:  Fair  Psychomotor Activity:  Normal  Concentration:  Concentration: Fair and Attention Span: Fair  Recall:  [employer] of Knowledge: Fair  Language: Fair  Akathisia:  No  Handed:  Right  AIMS (if indicated): denies tremors, rigidity  Assets:  Communication Skills Desire for Improvement Housing Social Support  ADL's:  Intact  Cognition: WNL  Sleep:  Restless   Screenings: GAD-7     Office Visit from 05/06/2019 in [place]  Total GAD-7 Score  11    PHQ2-9     Office Visit from 06/26/2019 in [place] at [facility] Visit from 06/09/2019 in [place] [facility] Visit from 05/06/2019 in [place]  PHQ-2 Total Score  2  2  3   PHQ-9 Total Score  9  10  17        Assessment and Plan: Dawn Booker is a 63 year old Caucasian female, married, unemployed, has a history of breast cancer, status post chemoradiation and mastectomy, gastroesophageal reflux disease, depression, anxiety, insomnia was evaluated by telemedicine today.  Patient is biologically predisposed given her family history, history of trauma.  She also has psychosocial stressors of relationship struggles, cancer diagnosis.  Patient is currently on Viibryd which she just started.  Patient continues to have anxiety symptoms will benefit from medications.  Plan as noted below.  Plan MDD-improving Wellbutrin XL 150 mg p.o. daily Viibryd 10 mg p.o. daily-she does started taking the dosage yesterday Continue CBT as needed.  Her therapist is Ms. Cordella Register.  GAD-some progress Continue  CBT Viibryd as prescribed  Insomnia-improving Temazepam 30 mg p.o. nightly Discussed sleep hygiene techniques as well as body scan meditation. Patient advised not to combine temazepam with the lorazepam.  Medications were chosen based on [facility] testing results.  Follow-up in clinic in 3 to 4 weeks or sooner if needed.  January 7 at 1 PM  I have spent atleast 15 minutes non face to face with patient today. More than 50 % of the time was spent for psychoeducation and supportive psychotherapy and care coordination. This note was generated in part or whole with voice recognition software. Voice recognition is usually quite accurate but there are transcription errors that can and very often do occur. I apologize for any typographical errors that were not detected and corrected.        Ursula Alert, MD 08/10/2019, 3:21 PM

## 2019-08-14 ENCOUNTER — Telehealth: Payer: Self-pay | Admitting: Family Medicine

## 2019-08-14 NOTE — Telephone Encounter (Signed)
error 

## 2019-08-18 ENCOUNTER — Ambulatory Visit: Payer: Self-pay

## 2019-08-18 ENCOUNTER — Ambulatory Visit (INDEPENDENT_AMBULATORY_CARE_PROVIDER_SITE_OTHER): Payer: 59 | Admitting: Family Medicine

## 2019-08-18 ENCOUNTER — Other Ambulatory Visit: Payer: Self-pay

## 2019-08-18 ENCOUNTER — Encounter: Payer: Self-pay | Admitting: Family Medicine

## 2019-08-18 DIAGNOSIS — M25552 Pain in left hip: Secondary | ICD-10-CM | POA: Diagnosis not present

## 2019-08-18 NOTE — Progress Notes (Signed)
Subjective: Patient is here for ultrasound-guided intra-articular left hip injection.   Last injection helped quite a but, but she strained it recently.  Would like to try another.  Objective:  Groin pain with passive IR.  Procedure: Ultrasound-guided left hip injection: After sterile prep with Betadine, injected 8 cc 1% lidocaine without epinephrine and 40 mg methylprednisolone using a 22-gauge spinal needle, passing the needle through the iliofemoral ligament into the femoral head/neck junction.  Injectate seen filling joint capsule.  Return as needed.  Future options include gel injection, prolotherapy with dextrose, or PRP.  Could also consider MRI in the future.

## 2019-08-19 ENCOUNTER — Telehealth: Payer: Self-pay

## 2019-08-19 NOTE — Telephone Encounter (Signed)
Started having some rash like symptoms with heat on her cheeks and on her chest, but no bumps (more redness), and states that it is not as warm as it was earlier today.  She also wanted to let you know she has had 3 injections this year--2 here and 1 at [facility] early in the year

## 2019-08-19 NOTE — Telephone Encounter (Signed)
Sounds like a steroid flare.  It should resolve in a few days.  It's fairly common.

## 2019-08-19 NOTE — Telephone Encounter (Signed)
Patient called stating that she is having a little reaction to the injection that she received on Tuesday, 08/18/2019 in her left hip.  Stated that it was nothing to be concerned about, but wanted to speak with Dr. Junius Roads.  CB# [phone].  Please advise.  Thank you.

## 2019-08-20 NOTE — Telephone Encounter (Signed)
I called and LMOM advising patient of the message from Dr. Junius Roads

## 2019-08-24 MED FILL — TEMAZEPAM 30 MG CAPSULE: 30 | 30 days supply | Qty: 30 | Fill #2

## 2019-08-25 DIAGNOSIS — Z170 Estrogen receptor positive status [ER+]: Secondary | ICD-10-CM | POA: Diagnosis not present

## 2019-08-25 DIAGNOSIS — C50112 Malignant neoplasm of central portion of left female breast: Secondary | ICD-10-CM | POA: Diagnosis not present

## 2019-08-26 ENCOUNTER — Telehealth: Payer: Self-pay

## 2019-08-26 ENCOUNTER — Telehealth: Payer: Self-pay | Admitting: Family Medicine

## 2019-08-26 NOTE — Telephone Encounter (Signed)
Pt informed.  Pt stated that she has already called the physician that started Viibryd.  Pt states that she will not go to UC d/t her being high risk for COVID.  Charyl Bigger, CMA

## 2019-08-26 NOTE — Telephone Encounter (Signed)
Medication problem - Patient reported she started Viibryd 2 weeks ago and since has had diarrhea 1-4 times a day, now hurting in her kidney areas of her back and fatiqued.  Questions if should continue medication.

## 2019-08-26 NOTE — Telephone Encounter (Signed)
Patient called into the office wanting advise.  She is feeling tired and has had back pain x 2 weeks. Patient is stating back pain is in her kidney area.  Please Advise!!

## 2019-08-26 NOTE — Telephone Encounter (Signed)
Medication management - Telephone call with patient to inform Dr. Pricilla Larsson wanted her to stop the Viibryd and to follow up with her PCP or an urgent care for the flank pain due to concerns for UTI or other issues. Patient stated she had already called her PCP but also did not want to go to an Urgent Care unless gets worse due to she stated she is at a high risk for COVID-19.  Patient stated if does get worse though she will follow up for medical emergent medical care.  Informed patient to follow up with Dr. Shea Evans in the coming week when she returns to see if there is something else she wants to start patient on then and patient agreed with plan.

## 2019-08-26 NOTE — Telephone Encounter (Signed)
If the symptoms have all been ever since she started the Viibryd, I would recommend she discuss these symptoms with the physician that started that medicine.  Let her know this is not a medicine that I prescribed and hence, would not be able to comment on its side effects.  However, if the flank pain and intermittent nausea is new and separate, please let her know that in someone of her age, this can be a urinary tract infection and I highly recommend she go to an urgent care or walk-in clinic to rule that out.  Please let her know we are off tomorrow and Friday for Christmas

## 2019-08-26 NOTE — Telephone Encounter (Signed)
Pt states that she has been having daily diarrhea, sometimes up to 3 times a day.  Pt states that this issue started after starting Vibryd.  Pt states that she having bilateral flank pain and some intermittent nausea.  Denies any strenuous activity or exercises.  Pt denies fever, urinary frequency, dysuria, vomiting or chills.  Please advise.  Charyl Bigger, CMA

## 2019-08-26 NOTE — Telephone Encounter (Signed)
Please call patient and besides feeling tired and having back pain, please see if she is having any other symptoms at all.    Also, please see if she has done something more strenuous with exercise/activity 2-3 days before the back pain started that would explain why her back is aggravated.    Thank you

## 2019-08-26 NOTE — Telephone Encounter (Signed)
thanks

## 2019-08-26 NOTE — Telephone Encounter (Signed)
I would recommend her to discontinue the Viibryd if symptoms of diarrhea started with that. She should seek medical attention by going to urgent care or ER if she is having pain in the kidney area. She may be at a higher risk of UTI give her age. Please call and let her know. Thanks

## 2019-08-27 ENCOUNTER — Inpatient Hospital Stay: Payer: 59

## 2019-08-27 ENCOUNTER — Other Ambulatory Visit: Payer: Self-pay

## 2019-08-27 ENCOUNTER — Telehealth: Payer: Self-pay

## 2019-08-27 ENCOUNTER — Telehealth: Payer: Self-pay | Admitting: Hematology and Oncology

## 2019-08-27 DIAGNOSIS — Z923 Personal history of irradiation: Secondary | ICD-10-CM | POA: Diagnosis not present

## 2019-08-27 DIAGNOSIS — Z9013 Acquired absence of bilateral breasts and nipples: Secondary | ICD-10-CM | POA: Diagnosis not present

## 2019-08-27 DIAGNOSIS — Z170 Estrogen receptor positive status [ER+]: Secondary | ICD-10-CM | POA: Diagnosis not present

## 2019-08-27 DIAGNOSIS — R109 Unspecified abdominal pain: Secondary | ICD-10-CM

## 2019-08-27 DIAGNOSIS — Z79811 Long term (current) use of aromatase inhibitors: Secondary | ICD-10-CM | POA: Diagnosis not present

## 2019-08-27 DIAGNOSIS — C50812 Malignant neoplasm of overlapping sites of left female breast: Secondary | ICD-10-CM | POA: Diagnosis not present

## 2019-08-27 LAB — URINALYSIS, COMPLETE (UACMP) WITH MICROSCOPIC
Bilirubin Urine: NEGATIVE
Glucose, UA: NEGATIVE mg/dL
Hgb urine dipstick: NEGATIVE
Ketones, ur: NEGATIVE mg/dL
Leukocytes,Ua: NEGATIVE
Nitrite: NEGATIVE
Protein, ur: NEGATIVE mg/dL
Specific Gravity, Urine: 1.002 — ABNORMAL LOW (ref 1.005–1.030)
pH: 6 (ref 5.0–8.0)

## 2019-08-27 NOTE — Telephone Encounter (Signed)
Pt reporting bilateral "kidney pain."  Pt states pain in kidney area has been present X 2 weeks, with mild fatigue.    Pt has been having increased diarrhea over the past several weeks due to new medication by PCP.  PCP has addressed with patient per patient. Pt denies any dysuria, polyuria, or odor.  Pt is trying to drink plenty of fluids.   RN reviewed with MD.  MD recommendations to obtain UA.  Pt aware, lab appointment made for today.

## 2019-08-27 NOTE — Telephone Encounter (Signed)
Complaining of bil flank pain: UA neg for UTI, it is also negative for blood ruling out kidney stones Patient thinks that it may be due to her new antidepressant medication. She had stopped it and she wants to wait till Monday to do any further investigation.  I discussed with her that she might need a CT scan of her abdomen for further evaluation if the pain is persistent.

## 2019-08-27 NOTE — Telephone Encounter (Signed)
RN returned call, voicemail left for return call.  

## 2019-08-31 ENCOUNTER — Encounter: Payer: Self-pay | Admitting: Hematology and Oncology

## 2019-08-31 ENCOUNTER — Telehealth: Payer: Self-pay

## 2019-08-31 MED FILL — VIT D2 1.25 MG (50,000 UNIT: 1.25 MG | 84 days supply | Qty: 12 | Fill #1

## 2019-08-31 NOTE — Telephone Encounter (Signed)
Returned call to patient to discuss medication issue.  Left voicemail.

## 2019-08-31 NOTE — Telephone Encounter (Signed)
issues with viibryd . she having kidney pain and she has been checked for kidney infection.

## 2019-09-01 ENCOUNTER — Ambulatory Visit (INDEPENDENT_AMBULATORY_CARE_PROVIDER_SITE_OTHER): Payer: 59 | Admitting: Family Medicine

## 2019-09-01 ENCOUNTER — Other Ambulatory Visit: Payer: Self-pay

## 2019-09-01 DIAGNOSIS — Z23 Encounter for immunization: Secondary | ICD-10-CM | POA: Diagnosis not present

## 2019-09-01 NOTE — Progress Notes (Signed)
Singrix vaccine given in R deltoid. Patient is fever free and tolerated vaccine well. VIS given. AS, CMA

## 2019-09-07 ENCOUNTER — Other Ambulatory Visit: Payer: Self-pay | Admitting: Family Medicine

## 2019-09-07 MED FILL — OMEPRAZOLE 40 MG CPDR: 40 | 90 days supply | Qty: 90 | Fill #0

## 2019-09-10 ENCOUNTER — Ambulatory Visit (INDEPENDENT_AMBULATORY_CARE_PROVIDER_SITE_OTHER): Payer: 59 | Admitting: Psychiatry

## 2019-09-10 ENCOUNTER — Encounter: Payer: Self-pay | Admitting: Psychiatry

## 2019-09-10 ENCOUNTER — Other Ambulatory Visit: Payer: Self-pay

## 2019-09-10 DIAGNOSIS — F3341 Major depressive disorder, recurrent, in partial remission: Secondary | ICD-10-CM

## 2019-09-10 DIAGNOSIS — F411 Generalized anxiety disorder: Secondary | ICD-10-CM | POA: Diagnosis not present

## 2019-09-10 DIAGNOSIS — F5105 Insomnia due to other mental disorder: Secondary | ICD-10-CM

## 2019-09-10 NOTE — Progress Notes (Signed)
Virtual Visit via Video Note  I connected with Dawn Booker on 09/10/19 at  1:00 PM EST by a video enabled telemedicine application and verified that I am speaking with the correct person using two identifiers.   I discussed the limitations of evaluation and management by telemedicine and the availability of in person appointments. The patient expressed understanding and agreed to proceed.     I discussed the assessment and treatment plan with the patient. The patient was provided an opportunity to ask questions and all were answered. The patient agreed with the plan and demonstrated an understanding of the instructions.   The patient was advised to call back or seek an in-person evaluation if the symptoms worsen or if the condition fails to improve as anticipated.   [place] MD OP Progress Note  09/10/2019 6:05 PM Dawn Booker  MRN:  [id number]  Chief Complaint:  Chief Complaint    Follow-up     HPI: Dawn Booker is a 64 year old Caucasian female, married, currently unemployed, used to work with [employer] in the past, [place], has a history of malignant neoplasm of left breast status post mastectomy, chemo radiation therapy, gastroesophageal reflux disease, hiatal hernia, depression, anxiety and insomnia was evaluated by telemedicine today.  Patient reports she stopped taking the Viibryd since she had flank pain.  She reports she was evaluated for a UTI and everything was within normal limits.  She stopped the Viibryd and now she feels better.  Patient reports she currently has a new puppy which is a few weeks old.  She reports she has to be up at night to take care of the puppy.  She reports she hence is sleep deprived.  That does affect her mood and energy level.  She otherwise denies any significant depressive symptoms.  She reports that Wellbutrin is helpful.  She reports the temazepam does help her to rest.  She denies any suicidality, homicidality or perceptual  disturbances.  Some time was spent providing education about medications as well as discussed her GeneSight testing report.  Since her symptoms are currently stable and since she is sensitive to medications in general discussed continuing CBT for now.  She is agreeable. Visit Diagnosis:    ICD-10-CM   1. MDD (major depressive disorder), recurrent, in partial remission ([place])  F33.41    improving  2. GAD (generalized anxiety disorder)  F41.1   3. Insomnia due to mental condition  F51.05     Past Psychiatric History: I have reviewed past psychiatric history from my progress note on 05/06/2019.  Past trials of Prozac, Zoloft, Celexa, Lexapro, Effexor, Cymbalta, Ambien, Lunesta, Brintellix, temazepam, Wellbutrin, lorazepam, Xanax, Viibryd  Past Medical History:  Past Medical History:  Diagnosis Date  . ADD (attention deficit disorder)   . Anxiety   . Cancer [facility])    breast cancer  . Depression   . GERD (gastroesophageal reflux disease)   . Hiatal hernia   . History of radiation therapy 08/13/2018- 09/25/2018   Left Chest wall and IM nodes/ 50 Gy in 25 fractions, Left supraclaicular fossa and PAB/ 50 Gy in 25 fractions, Left chest wall scar boost/ 10 Gy in 5 fractions.   . Hyperlipemia, mixed   . Intermittent palpitations   . Malignant neoplasm of overlapping sites of left breast in female, estrogen receptor positive ([place]) 05/06/2018   oncologist-  dr Lindi Adie--  Invasive Lobular Cancer, CIS, Stage IB, Grade 2 (pT3,pN1a,cM0), ER+---  s/p  left mastectomy with sln dissections and  right mastectomy (benign)  . PONV (postoperative nausea and vomiting)   . Right thyroid nodule   . Shingles 12/2017    Past Surgical History:  Procedure Laterality Date  . AXILLARY LYMPH NODE DISSECTION Left 07/09/2018   Procedure: COMPLETION OF LEFT AXILLARY LYMPH NODE DISSECTION;  Surgeon: Jovita Kussmaul, MD;  Location: [facility];  Service: General;  Laterality: Left;  . BREAST EXCISIONAL BIOPSY Left 1995   benign   . CARDIOVASCULAR STRESS TEST  06/21/2017   normal nuclear stress study w/ no ischemia/  normal LV function and wall function, nuclear stress ef 70%  . ELBOW SURGERY Left child  . MASTECTOMY W/ SENTINEL NODE BIOPSY Left 06/13/2018  . MASTECTOMY WITH RADIOACTIVE SEED GUIDED EXCISION AND AXILLARY SENTINEL LYMPH NODE BIOPSY Bilateral 06/13/2018   Procedure: LEFT MASTECTOMY WITH SENTINEL LYMPH NODE MAPPING AND TARGETED NODE DISSECTION AND RIGHT PROPHYLATIC MASTECTOMY;  Surgeon: Jovita Kussmaul, MD;  Location: [place] [facility];  Service: General;  Laterality: Bilateral;  . THYROIDECTOMY Left 10-31-2001   dr gerkin @[facility]   . TRANSTHORACIC ECHOCARDIOGRAM  06/21/2017   ef 60-65%/  trivial MR (no evidence mvp)  . TUBAL LIGATION  yrs ago  . VAGINAL HYSTERECTOMY  1990s    Family Psychiatric History: I have reviewed family psychiatric history from my progress note on 05/06/2019  Family History:  Family History  Problem Relation Age of Onset  . Hypertension Mother   . Heart attack Mother   . Breast cancer Mother   . Bipolar disorder Father   . Breast cancer Maternal Grandmother   . Breast cancer Cousin     Social History: Reviewed social history from my progress note on 05/06/2019 Social History   Socioeconomic History  . Marital status: Married    Spouse name: Not on file  . Number of children: 2  . Years of education: 63  . Highest education level: High school graduate  Occupational History  . Not on file  Tobacco Use  . Smoking status: Former Smoker    Packs/day: 0.25    Years: 0.00    Pack years: 0.00    Types: Cigarettes    Quit date: 12/05/2017    Years since quitting: 1.7  . Smokeless tobacco: Never Used  Substance and Sexual Activity  . Alcohol use: Not Currently  . Drug use: No  . Sexual activity: Yes    Partners: Male    Birth control/protection: None  Other Topics Concern  . Not on file  Social History Narrative   Lives at home with husband.   Right-handed.   1 cup coffee per  day, occasional soda or tea.   Social Determinants of Health   Financial Resource Strain:   . Difficulty of Paying Living Expenses: Not on file  Food Insecurity:   . Worried About Charity fundraiser in the Last Year: Not on file  . Ran Out of Food in the Last Year: Not on file  Transportation Needs:   . Lack of Transportation (Medical): Not on file  . Lack of Transportation (Non-Medical): Not on file  Physical Activity:   . Days of Exercise per Week: Not on file  . Minutes of Exercise per Session: Not on file  Stress:   . Feeling of Stress : Not on file  Social Connections:   . Frequency of Communication with Friends and Family: Not on file  . Frequency of Social Gatherings with Friends and Family: Not on file  . Attends Religious Services: Not on file  .  Active Member of Clubs or Organizations: Not on file  . Attends Archivist Meetings: Not on file  . Marital Status: Not on file    Allergies:  Allergies  Allergen Reactions  . Lamictal [Lamotrigine] Other (See Comments)    Katherina Right Syndrome  . Cymbalta [Duloxetine Hcl]     Katherina Right syndrome  . Hydrocodone Nausea Only  . Mobic [Meloxicam]     Same ingredients as lamictal  . Sulfa Antibiotics Hives  . Brintellix [Vortioxetine] Anxiety  . Corticosteroids Rash  . Morphine Palpitations and Other (See Comments)  . Other     "MYCINS" - severe abdominal cramping   . Prednisone Rash       . Prozac [Fluoxetine Hcl] Palpitations  . Zoloft [Sertraline] Anxiety    Metabolic Disorder Labs: Lab Results  Component Value Date   HGBA1C 5.8 (H) 06/23/2019   No results found for: PROLACTIN Lab Results  Component Value Date   CHOL 155 04/29/2019   TRIG 76 04/29/2019   HDL 64 04/29/2019   CHOLHDL 2.4 04/29/2019   VLDL 15 04/29/2019   LDLCALC 76 04/29/2019   LDLCALC 59 03/05/2018   Lab Results  Component Value Date   TSH 2.100 06/23/2019   TSH 1.58 09/24/2018    Therapeutic Level Labs: No  results found for: LITHIUM No results found for: VALPROATE No components found for:  CBMZ  Current Medications: Current Outpatient Medications  Medication Sig Dispense Refill  . acetaminophen (TYLENOL) 500 MG tablet Take 1,000 mg by mouth at bedtime.     Marland Kitchen buPROPion (WELLBUTRIN XL) 150 MG 24 hr tablet Take 150 mg by mouth daily.    . diphenhydrAMINE (BENADRYL) 25 mg capsule Take 25 mg by mouth at bedtime.    . folic acid (FOLVITE) A999333 MCG tablet Take 400 mcg by mouth daily.    Marland Kitchen LORazepam (ATIVAN) 0.5 MG tablet Take 1 tablet (0.5 mg total) by mouth every 8 (eight) hours as needed. for anxiety (Patient not taking: Reported on 07/14/2019) 30 tablet 1  . Magnesium 400 MG TABS Take 400 mg by mouth.    Marland Kitchen omeprazole (PRILOSEC) 40 MG capsule Take 1 capsule (40 mg total) by mouth daily. 90 capsule 0  . OVER THE COUNTER MEDICATION 2 tablets daily. Vegan Calcium 500    . rosuvastatin (CRESTOR) 10 MG tablet Take 1 tablet (10 mg total) by mouth at bedtime. 90 tablet 1  . temazepam (RESTORIL) 30 MG capsule Take 1 capsule (30 mg total) by mouth at bedtime. 30 capsule 1  . Vitamin D, Ergocalciferol, (DRISDOL) 1.25 MG (50000 UT) CAPS capsule Take 1 capsule (50,000 Units total) by mouth every 7 (seven) days. 12 capsule 3   No current facility-administered medications for this visit.     Musculoskeletal: Strength & Muscle Tone: UTA Gait & Station: normal Patient leans: N/A  Psychiatric Specialty Exam: Review of Systems  Psychiatric/Behavioral: Positive for sleep disturbance (Restless - improved).  All other systems reviewed and are negative.   There were no vitals taken for this visit.There is no height or weight on file to calculate BMI.  General Appearance: Casual  Eye Contact:  Fair  Speech:  Clear and Coherent  Volume:  Normal  Mood:  Euthymic  Affect:  Congruent  Thought Process:  Goal Directed and Descriptions of Associations: Intact  Orientation:  Full (Time, Place, and Person)   Thought Content: Logical   Suicidal Thoughts:  No  Homicidal Thoughts:  No  Memory:  Immediate;  Fair Recent;   Fair Remote;   Fair  Judgement:  Fair  Insight:  Fair  Psychomotor Activity:  Normal  Concentration:  Concentration: Fair and Attention Span: Fair  Recall:  [employer] of Knowledge: Fair  Language: Fair  Akathisia:  No  Handed:  Right  AIMS (if indicated): UTA  Assets:  Communication Skills Desire for Improvement Housing Social Support  ADL's:  Intact  Cognition: WNL  Sleep:  Restless due to taking care of her puppy   Screenings: GAD-7     Office Visit from 05/06/2019 in [place]  Total GAD-7 Score  11    PHQ2-9     Office Visit from 06/26/2019 in [place] at [facility] Visit from 06/09/2019 in [place] at [facility] Visit from 05/06/2019 in [place]  PHQ-2 Total Score  2  2  3   PHQ-9 Total Score  9  10  17        Assessment and Plan: Amillya is a 64 year old Caucasian female, married, unemployed, has a history of breast cancer, status post chemoradiation and mastectomy, gastroesophageal reflux disease, depression, anxiety, insomnia was evaluated by telemedicine today.  Patient is biologically predisposed given her family history, history of trauma.  She also has psychosocial stressors of relationship struggles, cancer diagnosis.  Patient is currently doing well with regards to her mood symptoms.  Plan as noted below.  Plan MDD in partial remission Wellbutrin XL 150 mg p.o. daily Discontinue Viibryd for side effects Discussed with patient to restart psychotherapy sessions with Ms. Bowman.  GAD-improving Continue CBT  Insomnia-improving Temazepam 30 mg p.o. nightly. Patient will continue to work on sleep hygiene techniques.  GeneSight testing results were reviewed and discussed with patient today.  Encourage patient to continue CBT.  Follow-up in  clinic in 8 weeks or sooner if needed.  March 10 at 10:30 AM  I have spent atleast 25 minutes non face to face with patient today. More than 50 % of the time was spent for  ordering medications and test ,psychoeducation and supportive psychotherapy and care coordination,as well as documenting clinical information in electronic health record,interpreting results of test and communication of results This note was generated in part or whole with voice recognition software. Voice recognition is usually quite accurate but there are transcription errors that can and very often do occur. I apologize for any typographical errors that were not detected and corrected.        Ursula Alert, MD 09/10/2019, 6:05 PM

## 2019-09-15 ENCOUNTER — Encounter: Payer: Self-pay | Admitting: Hematology and Oncology

## 2019-09-15 ENCOUNTER — Other Ambulatory Visit: Payer: Self-pay | Admitting: *Deleted

## 2019-09-15 DIAGNOSIS — C50812 Malignant neoplasm of overlapping sites of left female breast: Secondary | ICD-10-CM

## 2019-09-15 DIAGNOSIS — Z170 Estrogen receptor positive status [ER+]: Secondary | ICD-10-CM

## 2019-09-15 NOTE — Progress Notes (Signed)
Received call from pt stating she is experiencing left arm lymphedema.  Pt states lymphedema starts at her underarm and goes down to her elbow.  Pt denies any redness, warmth, or recent injury to the site.  Per MD pt to be evaluated in lymphedema clinic. Order placed in epic and pt appreciative of the advice.

## 2019-09-22 MED FILL — ROSUVASTATIN CALCIUM 10 MG: 10 | 90 days supply | Qty: 90 | Fill #1

## 2019-09-23 ENCOUNTER — Other Ambulatory Visit: Payer: Self-pay

## 2019-09-23 ENCOUNTER — Encounter: Payer: Self-pay | Admitting: Physical Therapy

## 2019-09-23 ENCOUNTER — Ambulatory Visit: Payer: 59 | Attending: Hematology and Oncology | Admitting: Physical Therapy

## 2019-09-23 DIAGNOSIS — R293 Abnormal posture: Secondary | ICD-10-CM | POA: Insufficient documentation

## 2019-09-23 DIAGNOSIS — I972 Postmastectomy lymphedema syndrome: Secondary | ICD-10-CM | POA: Diagnosis not present

## 2019-09-23 DIAGNOSIS — M25612 Stiffness of left shoulder, not elsewhere classified: Secondary | ICD-10-CM | POA: Diagnosis not present

## 2019-09-23 DIAGNOSIS — M6281 Muscle weakness (generalized): Secondary | ICD-10-CM | POA: Insufficient documentation

## 2019-09-23 MED FILL — TEMAZEPAM 30 MG CAPSULE: 30 | 30 days supply | Qty: 30 | Fill #0

## 2019-09-23 NOTE — Therapy (Signed)
[place], [place], [address] Phone: [phone]   Fax:  [phone]  Physical Therapy Evaluation  Patient Details  Name: PIERRE CUMPTON MRN: [id number] Date of Birth: 24-Aug-1956 Referring Provider (PT): Dr.Gudena    Encounter Date: 09/23/2019  PT End of Session - 09/23/19 1609    Visit Number  1    Number of Visits  3    Date for PT Re-Evaluation  01/01/20    PT Start Time  1500    PT Stop Time  1545    PT Time Calculation (min)  45 min    Activity Tolerance  Patient tolerated treatment well    Behavior During Therapy  [facility] for tasks assessed/performed       Past Medical History:  Diagnosis Date  . ADD (attention deficit disorder)   . Anxiety   . Cancer [facility])    breast cancer  . Depression   . GERD (gastroesophageal reflux disease)   . Hiatal hernia   . History of radiation therapy 08/13/2018- 09/25/2018   Left Chest wall and IM nodes/ 50 Gy in 25 fractions, Left supraclaicular fossa and PAB/ 50 Gy in 25 fractions, Left chest wall scar boost/ 10 Gy in 5 fractions.   . Hyperlipemia, mixed   . Intermittent palpitations   . Malignant neoplasm of overlapping sites of left breast in female, estrogen receptor positive ([place]) 05/06/2018   oncologist-  dr Lindi Adie--  Invasive Lobular Cancer, CIS, Stage IB, Grade 2 (pT3,pN1a,cM0), ER+---  s/p  left mastectomy with sln dissections and right mastectomy (benign)  . PONV (postoperative nausea and vomiting)   . Right thyroid nodule   . Shingles 12/2017    Past Surgical History:  Procedure Laterality Date  . AXILLARY LYMPH NODE DISSECTION Left 07/09/2018   Procedure: COMPLETION OF LEFT AXILLARY LYMPH NODE DISSECTION;  Surgeon: Jovita Kussmaul, MD;  Location: [facility];  Service: General;  Laterality: Left;  . BREAST EXCISIONAL BIOPSY Left 1995   benign  . CARDIOVASCULAR STRESS TEST  06/21/2017   normal nuclear stress study w/ no ischemia/  normal LV function  and wall function, nuclear stress ef 70%  . ELBOW SURGERY Left child  . MASTECTOMY W/ SENTINEL NODE BIOPSY Left 06/13/2018  . MASTECTOMY WITH RADIOACTIVE SEED GUIDED EXCISION AND AXILLARY SENTINEL LYMPH NODE BIOPSY Bilateral 06/13/2018   Procedure: LEFT MASTECTOMY WITH SENTINEL LYMPH NODE MAPPING AND TARGETED NODE DISSECTION AND RIGHT PROPHYLATIC MASTECTOMY;  Surgeon: Jovita Kussmaul, MD;  Location: [facility];  Service: General;  Laterality: Bilateral;  . THYROIDECTOMY Left 10-31-2001   dr gerkin _0   . TRANSTHORACIC ECHOCARDIOGRAM  06/21/2017   ef 60-65%/  trivial MR (no evidence mvp)  . TUBAL LIGATION  yrs ago  . VAGINAL HYSTERECTOMY  1990s    There were no vitals filed for this visit.   Subjective Assessment - 09/23/19 1559    Subjective  Pt is doing her lymphedema exercises at home and is using her Flexitouch consistently.  She is having trouble with wearing compression garments and has some tightness in her left axilla    Pertinent History  Patient was diagnosed on 04/25/18 with left grade II invasive lobular carci1noma breast cancer. There are 2 areas: 2.6 cm in the upper outer quadrant and 6 mm in the upper inner quadrant. Both are ER/PR positive and HER2 negative with a Ki67 of 10% and she has a positive axillary node. Bilateral mastectomy on 06/13/2018 wiht 7 nodes removed and the 10  in second surgery on 07/09/2018  She had radiation and did well.    Patient Stated Goals  to make sure she is ok    Currently in Pain?  No/denies         [facility] PT Assessment - 09/23/19 0001      Assessment   Medical Diagnosis  Left breast cancer    Referring Provider (PT)  Dr.Gudena     Onset Date/Surgical Date  04/25/18    Hand Dominance  Right      Precautions   Precautions  Other (comment)    Precaution Comments  lymphedema      Restrictions   Weight Bearing Restrictions  No      Balance Screen   Has the patient fallen in the past 6 months  No    Has the patient had a decrease in activity  level because of a fear of falling?   No    Is the patient reluctant to leave their home because of a fear of falling?   No      Home Environment   Living Environment  Private residence    Living Arrangements  Spouse/significant other    Available Help at Discharge  Family      Prior Function   Level of Independence  Independent    Vocation  --   on leave from work    Leisure  lymphedema exercises      Cognition   Overall Cognitive Status  Within Functional Limits for tasks assessed      Observation/Other Assessments   Observations  pt has darkened skin in radiation field on left upper quadrant. She has visible fullness in left chest  especially below incision, lateral chest, axilla and back     Skin Integrity  no open areas       Observation/Other Assessments-Edema    Edema  Circumferential      Sensation   Light Touch  Not tested      Coordination   Gross Motor Movements are Fluid and Coordinated  Yes      Posture/Postural Control   Posture/Postural Control  No significant limitations    Postural Limitations  Rounded Shoulders;Forward head    Posture Comments  appears to have generalized muscle weakness       AROM   Overall AROM   Deficits    Overall AROM Comments  tightness in left shoulder but [facility]       Strength   Overall Strength  Within functional limits for tasks performed        LYMPHEDEMA/ONCOLOGY QUESTIONNAIRE - 09/23/19 1608      Type   Cancer Type  left breast cancer       Surgeries   Mastectomy Date  06/13/18    Other Surgery Date  07/09/18    Number Lymph Nodes Removed  17      Date Lymphedema/Swelling Started   Date  07/09/18      Treatment   Past Radiation Treatment  Yes    Date  09/25/18    Current Hormone Treatment  Yes      What other symptoms do you have   Are you Having Heaviness or Tightness  Yes    Are you having Pain  Yes    Are you having pitting edema  No    Is it Hard or Difficult finding clothes that fit  No    Do you  have infections  No    Is there Decreased  scar mobility  Yes    Stemmer Sign  No    Other Symptoms  visible fullness in thenar eminence       Lymphedema Assessments   Lymphedema Assessments  Upper extremities      Left Upper Extremity Lymphedema   15 cm Proximal to Olecranon Process  30 cm    10 cm Proximal to Olecranon Process  27.3 cm    Olecranon Process  23 cm    15 cm Proximal to Ulnar Styloid Process  23.5 cm    10 cm Proximal to Ulnar Styloid Process  21 cm    Just Proximal to Ulnar Styloid Process  15.1 cm    Across Hand at [employer]  18 cm    At [place] of 2nd Digit  5.8 cm    Other  91.5    Other  93.7    Other  92             Objective measurements completed on examination: See above findings.      [place] Adult PT Treatment/Exercise - 09/23/19 0001      Exercises   Exercises  Shoulder    Other Exercises   20 reps of right shoulder felxion followed by decreased tightness in left shoulder, also did push into end range x 5 with less tighness in left shoulder       Manual Therapy   Edema Management  showed pt several options for swell spots from compression guru that would provide compression to anterior chest, around lateral chest to back of chest.  Pt will pursue getting these on her own              PT Education - 09/23/19 1609    Education Details  various types of swell spots for trunk and axillary tightness    Person(s) Educated  Patient    Methods  Explanation;Demonstration    Comprehension  Verbalized understanding       PT Short Term Goals - 06/16/19 1320      PT SHORT TERM GOAL #4   Title  Pt will have reduction in LUE circumferential measurements by 0.5 cm or greater within 2 weeks in order to demonstrate improved utilization of garment and pump.    Baseline  Pt has met this goal in some areas since 4 weeks but has remained the same in others. 2 weeks ago she was down significantly but she tends to fluctuate.    Status  Not Met         PT Long Term Goals - 09/23/19 1614      PT LONG TERM GOAL #1   Title  Pt will report she is able to manage her lymphedem and axillary tightness at home    Time  4    Period  Months    Status  New      PT LONG TERM GOAL #2   Title  Pt will report her axillary tightness is decreased by 50%    Time  4    Period  Months    Status  New             Plan - 09/23/19 1610    Clinical Impression Statement  Pt comes in for recheck of her lymphedema. She is doing exercises and using flexitouch at home with compression as she can tolerate but is still having tightness in her axilla.  She was given several options for swell spots that may help her and  some options for more exercise to help decrese the tightness in her left axilla that she will incorporate into her routine.  She does not feel she needs another visit at this time but would like to keep the episode open should she want to come in for another recheck in 3-4 months.    Personal Factors and Comorbidities  Comorbidity 2    Comorbidities  2 surgeries for breast cancer, radiation    Examination-Activity Limitations  Reach Overhead    Stability/Clinical Decision Making  Stable/Uncomplicated    Clinical Decision Making  Low    Rehab Potential  Good    Clinical Impairments Affecting Rehab Potential  17 nodes removed with second surgery needed for lymph node dissection , radiation     PT Frequency  Other (comment)   episode open for recheck if needed   PT Treatment/Interventions  ADLs/Self Care Home Management;DME Instruction;Therapeutic activities;Therapeutic exercise;Orthotic Fit/Training;Patient/family education;Manual techniques;Neuromuscular re-education;Manual lymph drainage;Compression bandaging;Scar mobilization;Taping;Passive range of motion    PT Next Visit Plan  reassess    Consulted and Agree with Plan of Care  Patient       Patient will benefit from skilled therapeutic intervention in order to improve the following  deficits and impairments:  Decreased knowledge of precautions, Pain, Impaired UE functional use, Decreased range of motion, Postural dysfunction, Decreased skin integrity, Increased fascial restricitons, Decreased scar mobility, Impaired perceived functional ability, Decreased strength, Decreased activity tolerance, Increased edema, Increased muscle spasms  Visit Diagnosis: Stiffness of left shoulder joint - Plan: PT plan of care cert/re-cert  Abnormal posture - Plan: PT plan of care cert/re-cert  Muscle weakness (generalized) - Plan: PT plan of care cert/re-cert  Postmastectomy lymphedema - Plan: PT plan of care cert/re-cert     Problem List Patient Active Problem List   Diagnosis Date Noted  . Insomnia 06/26/2019  . Glucose intolerance (impaired glucose tolerance) 06/26/2019  . Hyperlipidemia 06/26/2019  . Vitamin D deficiency 06/26/2019  . Osteopenia after menopause 06/26/2019  . Need for vaccination 06/09/2019  . H/O left mastectomy 06/09/2019  . MDD (major depressive disorder), recurrent episode, moderate ([place]) 05/06/2019  . GAD (generalized anxiety disorder) 05/06/2019  . Insomnia due to mental condition 05/06/2019  . Cancer of left female breast  ([place]) 06/13/2018  . Malignant neoplasm of overlapping sites of left breast in female, estrogen receptor positive ([place]) 05/09/2018  . Blurring of visual image of both eyes 12/06/2017  . Postherpetic neuralgia 12/06/2017  . Nasal turbinate hypertrophy 10/09/2016  . Nasal congestion 08/31/2016  . Deviated septum 08/31/2016  . ESOPHAGEAL REFLUX 10/10/2009   Donato Heinz. Owens Shark PT  Norwood Levo 09/23/2019, 4:17 PM  [place], [place], [address] Phone: [phone]   Fax:  [phone]  Name: BRITTNAE ASCHENBRENNER MRN: [id number] Date of Birth: 10-20-1955

## 2019-09-27 MED FILL — buPROPion HCL ER (XL) 150 M: 150 | 90 days supply | Qty: 90 | Fill #1

## 2019-10-26 MED FILL — TEMAZEPAM 30 MG CAPSULE: 30 | 30 days supply | Qty: 30 | Fill #0

## 2019-11-11 ENCOUNTER — Other Ambulatory Visit: Payer: Self-pay

## 2019-11-11 ENCOUNTER — Ambulatory Visit (INDEPENDENT_AMBULATORY_CARE_PROVIDER_SITE_OTHER): Payer: 59 | Admitting: Psychiatry

## 2019-11-11 ENCOUNTER — Encounter: Payer: Self-pay | Admitting: Psychiatry

## 2019-11-11 DIAGNOSIS — F411 Generalized anxiety disorder: Secondary | ICD-10-CM | POA: Diagnosis not present

## 2019-11-11 DIAGNOSIS — F5105 Insomnia due to other mental disorder: Secondary | ICD-10-CM | POA: Diagnosis not present

## 2019-11-11 DIAGNOSIS — F331 Major depressive disorder, recurrent, moderate: Secondary | ICD-10-CM

## 2019-11-11 MED ORDER — VILAZODONE HCL 10 MG PO TABS: 10.0000 mg | ORAL_TABLET | Freq: Every day | ORAL | 0 refills | Status: DC

## 2019-11-11 MED ORDER — TEMAZEPAM 30 MG PO CAPS: 30.0000 mg | ORAL_CAPSULE | Freq: Every day | ORAL | 1 refills | Status: DC

## 2019-11-11 MED FILL — VIIBRYD 10 MG TABLET: 10 | 30 days supply | Qty: 30 | Fill #0

## 2019-11-11 NOTE — Progress Notes (Signed)
Provider Location : [facility] Patient Location : Home  Virtual Visit via Video Note  I connected with Dawn Booker on 11/11/19 at 10:30 AM EST by a video enabled telemedicine application and verified that I am speaking with the correct person using two identifiers.   I discussed the limitations of evaluation and management by telemedicine and the availability of in person appointments. The patient expressed understanding and agreed to proceed.     I discussed the assessment and treatment plan with the patient. The patient was provided an opportunity to ask questions and all were answered. The patient agreed with the plan and demonstrated an understanding of the instructions.   The patient was advised to call back or seek an in-person evaluation if the symptoms worsen or if the condition fails to improve as anticipated.   [place] MD OP Progress Note  11/11/2019 1:56 PM CHANNAH BUDZINSKI  MRN:  [id number]  Chief Complaint:  Chief Complaint    Follow-up     HPI: Dawn Booker is a 64 year old Caucasian female, married, currently unemployed, lives in [place], has a history of malignant neoplasm of left breast status post mastectomy, chemoradiation therapy, gastroesophageal reflux disease, hiatal hernia, depression, anxiety, insomnia was evaluated by telemedicine today.  Patient was last evaluated on 09/10/2019.  At that time she was taken off of the Viibryd since she reported flank pain after taking it.  Today she reports that she continues to struggle with mood lability.  She has not had any mood lability in the past 3 weeks however she is worried about having it again.  She reports she hence wants to try going back on the [place].  She reports while she was on the Viibryd she was also taking daily walks which may have triggered the flank pain.  She hence wants to give it another try since she is not working at this time.  Patient reports she continues to use temazepam for sleep and wants to stay on  that.  Sleep is good on the temazepam.  Patient denies any suicidality, homicidality or perceptual disturbances.  Patient continues to have several psychosocial stressors including the current pandemic and her own health issues.  Discussed referral for psychotherapy sessions again.  She agrees.  We will refer her to our therapist here in clinic.  Patient denies any other concerns today. Visit Diagnosis:    ICD-10-CM   1. MDD (major depressive disorder), recurrent episode, moderate (HCC)  F33.1 temazepam (RESTORIL) 30 MG capsule  2. GAD (generalized anxiety disorder)  F41.1 Vilazodone HCl (VIIBRYD) 10 MG TABS    temazepam (RESTORIL) 30 MG capsule  3. Insomnia due to mental condition  F51.05 temazepam (RESTORIL) 30 MG capsule    Past Psychiatric History: I have reviewed past psychiatric history from my progress note on 05/06/2019.  Past trials of Prozac, Zoloft, Celexa, Lexapro, Effexor, Cymbalta, Ambien, Lunesta, Brintellix, temazepam, Wellbutrin, lorazepam, Xanax, Viibryd  Past Medical History:  Past Medical History:  Diagnosis Date  . ADD (attention deficit disorder)   . Anxiety   . Cancer [facility])    breast cancer  . Depression   . GERD (gastroesophageal reflux disease)   . Hiatal hernia   . History of radiation therapy 08/13/2018- 09/25/2018   Left Chest wall and IM nodes/ 50 Gy in 25 fractions, Left supraclaicular fossa and PAB/ 50 Gy in 25 fractions, Left chest wall scar boost/ 10 Gy in 5 fractions.   . Hyperlipemia, mixed   . Intermittent palpitations   . Malignant  neoplasm of overlapping sites of left breast in female, estrogen receptor positive ([place]) 05/06/2018   oncologist-  dr Lindi Adie--  Invasive Lobular Cancer, CIS, Stage IB, Grade 2 (pT3,pN1a,cM0), ER+---  s/p  left mastectomy with sln dissections and right mastectomy (benign)  . PONV (postoperative nausea and vomiting)   . Right thyroid nodule   . Shingles 12/2017    Past Surgical History:  Procedure Laterality Date  .  AXILLARY LYMPH NODE DISSECTION Left 07/09/2018   Procedure: COMPLETION OF LEFT AXILLARY LYMPH NODE DISSECTION;  Surgeon: Jovita Kussmaul, MD;  Location: [facility];  Service: General;  Laterality: Left;  . BREAST EXCISIONAL BIOPSY Left 1995   benign  . CARDIOVASCULAR STRESS TEST  06/21/2017   normal nuclear stress study w/ no ischemia/  normal LV function and wall function, nuclear stress ef 70%  . ELBOW SURGERY Left child  . MASTECTOMY W/ SENTINEL NODE BIOPSY Left 06/13/2018  . MASTECTOMY WITH RADIOACTIVE SEED GUIDED EXCISION AND AXILLARY SENTINEL LYMPH NODE BIOPSY Bilateral 06/13/2018   Procedure: LEFT MASTECTOMY WITH SENTINEL LYMPH NODE MAPPING AND TARGETED NODE DISSECTION AND RIGHT PROPHYLATIC MASTECTOMY;  Surgeon: Jovita Kussmaul, MD;  Location: [place];  Service: General;  Laterality: Bilateral;  . THYROIDECTOMY Left 10-31-2001   dr gerkin @[facility]   . TRANSTHORACIC ECHOCARDIOGRAM  06/21/2017   ef 60-65%/  trivial MR (no evidence mvp)  . TUBAL LIGATION  yrs ago  . VAGINAL HYSTERECTOMY  1990s    Family Psychiatric History: I have reviewed family psychiatric history from my progress note on 05/06/2019  Family History:  Family History  Problem Relation Age of Onset  . Hypertension Mother   . Heart attack Mother   . Breast cancer Mother   . Bipolar disorder Father   . Breast cancer Maternal Grandmother   . Breast cancer Cousin     Social History: Reviewed social history from my progress note on 05/06/2019 Social History   Socioeconomic History  . Marital status: Married    Spouse name: Not on file  . Number of children: 2  . Years of education: 57  . Highest education level: High school graduate  Occupational History  . Not on file  Tobacco Use  . Smoking status: Former Smoker    Packs/day: 0.25    Years: 0.00    Pack years: 0.00    Types: Cigarettes    Quit date: 12/05/2017    Years since quitting: 1.9  . Smokeless tobacco: Never Used  Substance and Sexual Activity  . Alcohol use:  Not Currently  . Drug use: No  . Sexual activity: Yes    Partners: Male    Birth control/protection: None  Other Topics Concern  . Not on file  Social History Narrative   Lives at home with husband.   Right-handed.   1 cup coffee per day, occasional soda or tea.   Social Determinants of Health   Financial Resource Strain:   . Difficulty of Paying Living Expenses: Not on file  Food Insecurity:   . Worried About Charity fundraiser in the Last Year: Not on file  . Ran Out of Food in the Last Year: Not on file  Transportation Needs:   . Lack of Transportation (Medical): Not on file  . Lack of Transportation (Non-Medical): Not on file  Physical Activity:   . Days of Exercise per Week: Not on file  . Minutes of Exercise per Session: Not on file  Stress:   . Feeling of Stress : Not  on file  Social Connections:   . Frequency of Communication with Friends and Family: Not on file  . Frequency of Social Gatherings with Friends and Family: Not on file  . Attends Religious Services: Not on file  . Active Member of Clubs or Organizations: Not on file  . Attends Archivist Meetings: Not on file  . Marital Status: Not on file    Allergies:  Allergies  Allergen Reactions  . Lamictal [Lamotrigine] Other (See Comments)    Katherina Right Syndrome  . Cymbalta [Duloxetine Hcl]     Katherina Right syndrome  . Hydrocodone Nausea Only  . Mobic [Meloxicam]     Same ingredients as lamictal  . Sulfa Antibiotics Hives  . Brintellix [Vortioxetine] Anxiety  . Corticosteroids Rash  . Morphine Palpitations and Other (See Comments)  . Other     "MYCINS" - severe abdominal cramping   . Prednisone Rash       . Prozac [Fluoxetine Hcl] Palpitations  . Zoloft [Sertraline] Anxiety    Metabolic Disorder Labs: Lab Results  Component Value Date   HGBA1C 5.8 (H) 06/23/2019   No results found for: PROLACTIN Lab Results  Component Value Date   CHOL 155 04/29/2019   TRIG 76 04/29/2019    HDL 64 04/29/2019   CHOLHDL 2.4 04/29/2019   VLDL 15 04/29/2019   LDLCALC 76 04/29/2019   LDLCALC 59 03/05/2018   Lab Results  Component Value Date   TSH 2.100 06/23/2019   TSH 1.58 09/24/2018    Therapeutic Level Labs: No results found for: LITHIUM No results found for: VALPROATE No components found for:  CBMZ  Current Medications: Current Outpatient Medications  Medication Sig Dispense Refill  . L-Methylfolate 15 MG TABS     . acetaminophen (TYLENOL) 500 MG tablet Take 1,000 mg by mouth at bedtime.     Marland Kitchen buPROPion (WELLBUTRIN XL) 150 MG 24 hr tablet Take 150 mg by mouth daily.    . diphenhydrAMINE (BENADRYL) 25 mg capsule Take 25 mg by mouth at bedtime.    . folic acid (FOLVITE) A999333 MCG tablet Take 400 mcg by mouth daily.    Marland Kitchen LORazepam (ATIVAN) 0.5 MG tablet Take 1 tablet (0.5 mg total) by mouth every 8 (eight) hours as needed. for anxiety (Patient not taking: Reported on 07/14/2019) 30 tablet 1  . Magnesium 400 MG TABS Take 400 mg by mouth.    Marland Kitchen omeprazole (PRILOSEC) 40 MG capsule Take 1 capsule (40 mg total) by mouth daily. 90 capsule 0  . OVER THE COUNTER MEDICATION 2 tablets daily. Vegan Calcium 500    . rosuvastatin (CRESTOR) 10 MG tablet Take 1 tablet (10 mg total) by mouth at bedtime. 90 tablet 1  . [START ON 11/24/2019] temazepam (RESTORIL) 30 MG capsule Take 1 capsule (30 mg total) by mouth at bedtime. 30 capsule 1  . Vilazodone HCl (VIIBRYD) 10 MG TABS Take 1 tablet (10 mg total) by mouth daily. 30 tablet 0  . Vitamin D, Ergocalciferol, (DRISDOL) 1.25 MG (50000 UT) CAPS capsule Take 1 capsule (50,000 Units total) by mouth every 7 (seven) days. 12 capsule 3   No current facility-administered medications for this visit.     Musculoskeletal: Strength & Muscle Tone: UTA Gait & Station: normal Patient leans: N/A  Psychiatric Specialty Exam: Review of Systems  Psychiatric/Behavioral: Positive for dysphoric mood.  All other systems reviewed and are negative.    There were no vitals taken for this visit.There is no height or weight on file  to calculate BMI.  General Appearance: Casual  Eye Contact:  Fair  Speech:  Clear and Coherent  Volume:  Normal  Mood:  Depressed on and off  Affect:  Congruent  Thought Process:  Goal Directed and Descriptions of Associations: Intact  Orientation:  Full (Time, Place, and Person)  Thought Content: Logical   Suicidal Thoughts:  No  Homicidal Thoughts:  No  Memory:  Immediate;   Fair Recent;   Fair Remote;   Fair  Judgement:  Fair  Insight:  Fair  Psychomotor Activity:  Normal  Concentration:  Concentration: Fair and Attention Span: Fair  Recall:  [employer] of Knowledge: Fair  Language: Fair  Akathisia:  No  Handed:  Right  AIMS (if indicated): UTA  Assets:  Communication Skills Desire for Improvement Housing Social Support  ADL's:  Intact  Cognition: WNL  Sleep:  Fair   Screenings: GAD-7     Office Visit from 05/06/2019 in [place]  Total GAD-7 Score  11    PHQ2-9     Office Visit from 06/26/2019 in [place] at [facility] Visit from 06/09/2019 in [place] at [facility] Visit from 05/06/2019 in [place]  PHQ-2 Total Score  2  2  3   PHQ-9 Total Score  9  10  17        Assessment and Plan: [place] is a 64 year old Caucasian female, married, unemployed, has a history of breast cancer, status post chemoradiation, mastectomy, gastroesophageal reflux disease, depression, anxiety, insomnia was evaluated by telemedicine today.  Patient is biologically predisposed given her family history, history of trauma.  She also has psychosocial stressors of relationship struggles, cancer diagnosis.  Patient has developed adverse side effects to multiple medication trials including Viibryd recently.  She however reports she wants to give Viibryd another trial since she her adverse side effects of pain could  have been due to walking.  Plan as noted below.  Plan MDD-unstable Wellbutrin XL 150 mg p.o. daily Restart Viibryd 10 mg p.o. daily. Patient reports she would like to restart psychotherapy sessions-we will refer her to therapist here in clinic  GAD-improving Continue CBT  Insomnia-stable Temazepam 30 mg p.o. nightly  Patient reports she would like to be back on the lorazepam as needed, discussed with patient that lorazepam cannot be prescribed in combination with temazepam.  Follow-up in clinic in 4 weeks or sooner if needed.  I have spent atleast 20 minutes non - face to face with patient today. More than 50 % of the time was spent for  ordering medications and test ,psychoeducation and supportive psychotherapy and care coordination,as well as documenting clinical information in electronic health record. This note was generated in part or whole with voice recognition software. Voice recognition is usually quite accurate but there are transcription errors that can and very often do occur. I apologize for any typographical errors that were not detected and corrected.       Ursula Alert, MD 11/11/2019, 1:56 PM

## 2019-11-23 MED FILL — VIT D2 1.25 MG (50,000 UNIT: 1.25 MG | 84 days supply | Qty: 12 | Fill #2

## 2019-11-24 MED FILL — TEMAZEPAM 30 MG CAPSULE: 30 | 30 days supply | Qty: 30 | Fill #0

## 2019-11-27 ENCOUNTER — Telehealth: Payer: Self-pay

## 2019-11-27 NOTE — Telephone Encounter (Signed)
Pt inquiring if she can take Collagen peptide powder with her history of breast cancer.   Per MD ok to take, use as directed.  Pt notified, voiced understanding.    Pt reporting she was having some pain in right arm.  Pt denies any recent injuries.  No swelling, lumps or nodules present to right axillary or breast.  RN recommend patient use OTC analgesics for 2-3 days to see if pain subsides.  Pt verbalized understanding and agreement.

## 2019-12-07 ENCOUNTER — Telehealth: Payer: Self-pay | Admitting: *Deleted

## 2019-12-07 NOTE — Telephone Encounter (Signed)
Received call from pt stating she is continuing to experience right arm pain and weakness.  Pt states last week she was advised to take OTC analgesics and warm compress with no relief.  Pt denies any recent injury or trauma.  Per MD pt to be seen in [facility].  Scheduling message sent and pt verbalized understanding.

## 2019-12-08 ENCOUNTER — Ambulatory Visit: Payer: 59 | Admitting: Psychiatry

## 2019-12-08 ENCOUNTER — Inpatient Hospital Stay: Payer: 59 | Attending: Medical | Admitting: Medical

## 2019-12-08 ENCOUNTER — Other Ambulatory Visit: Payer: Self-pay

## 2019-12-08 VITALS — BP 120/85 | HR 94 | Temp 98.5°F | Resp 18 | Ht 65.25 in | Wt 156.8 lb

## 2019-12-08 DIAGNOSIS — K219 Gastro-esophageal reflux disease without esophagitis: Secondary | ICD-10-CM | POA: Diagnosis not present

## 2019-12-08 DIAGNOSIS — Z8249 Family history of ischemic heart disease and other diseases of the circulatory system: Secondary | ICD-10-CM | POA: Insufficient documentation

## 2019-12-08 DIAGNOSIS — Z923 Personal history of irradiation: Secondary | ICD-10-CM | POA: Diagnosis not present

## 2019-12-08 DIAGNOSIS — Z803 Family history of malignant neoplasm of breast: Secondary | ICD-10-CM | POA: Diagnosis not present

## 2019-12-08 DIAGNOSIS — Z87891 Personal history of nicotine dependence: Secondary | ICD-10-CM | POA: Diagnosis not present

## 2019-12-08 DIAGNOSIS — Z9013 Acquired absence of bilateral breasts and nipples: Secondary | ICD-10-CM | POA: Insufficient documentation

## 2019-12-08 DIAGNOSIS — Z170 Estrogen receptor positive status [ER+]: Secondary | ICD-10-CM | POA: Diagnosis not present

## 2019-12-08 DIAGNOSIS — M7711 Lateral epicondylitis, right elbow: Secondary | ICD-10-CM | POA: Diagnosis not present

## 2019-12-08 DIAGNOSIS — Z90710 Acquired absence of both cervix and uterus: Secondary | ICD-10-CM | POA: Diagnosis not present

## 2019-12-08 DIAGNOSIS — E782 Mixed hyperlipidemia: Secondary | ICD-10-CM | POA: Diagnosis not present

## 2019-12-08 DIAGNOSIS — C50812 Malignant neoplasm of overlapping sites of left female breast: Secondary | ICD-10-CM | POA: Insufficient documentation

## 2019-12-08 MED ORDER — CYCLOBENZAPRINE HCL 5 MG PO TABS: 5.0000 mg | ORAL_TABLET | Freq: Every day | ORAL | 0 refills | Status: DC

## 2019-12-08 MED FILL — CYCLOBENZAPRINE HCL 5 MG TA: 5 | 21 days supply | Qty: 21 | Fill #0

## 2019-12-08 NOTE — Patient Instructions (Signed)

## 2019-12-08 NOTE — Progress Notes (Addendum)
Symptoms Management Clinic Progress Note   Dawn Booker [id number] [birth year] 64 y.o.  Dawn Booker is managed by Dr. Nicholas Lose  Actively treated with chemotherapy/immunotherapy/hormonal therapy: no  Last treated: daily  Next scheduled appointment with provider: 08/04/2020  Assessment: Plan:    Malignant neoplasm of overlapping sites of left breast in female, estrogen receptor positive ([place])  Right lateral epicondylitis - Plan: cyclobenzaprine (FLEXERIL) 5 MG tablet   ER positive left breast cancer: Ms. Shahid is followed by Dr. Lindi Adie. She is scheduled to be seen next on 08/04/2020.   Right lateral epicondylitis: The patient was instructed to obtain an elbow support strap which she will use until better.  The patient was also given a prescription for Flexeril 5 mg p.o. nightly as needed #21 with no refills.  Please see After Visit Summary for patient specific instructions.  Future Appointments  Date Time Provider [place]  12/29/2019  3:30 PM Ursula Alert, MD [facility] None  08/04/2020 10:00 AM Nicholas Lose, MD [facility] None    No orders of the defined types were placed in this encounter.      Subjective:   Patient ID:  Dawn Booker is a 64 y.o. (DOB 27-Mar-1956) female.  Chief Complaint:  Chief Complaint  Patient presents with  . Arm Pain    Right   HPI Dawn Booker is a 64 y.o. female with a diagnosis of estrogen receptor positive malignant neoplasm of overlapping sites of left breast. She is followed by Dr. Lindi Adie. She was last seen on 08/04/2019 and is scheduled to be seen next on 08/04/2020. She presents to clinic today after calling yesterday to report that shewas continuing to experience right arm pain and weakness.  She called last week and was advised to take OTC analgesics and warm compress but had no relief.  She denies any recent injury or trauma.    She reports that she was in the garden weeding her flowers before this pain  developed in her right lateral elbow.  She had one brief episode of numbness in her right index finger which lasted momentarily.  Medications: I have reviewed the patient's current medications.  Allergies:  Allergies  Allergen Reactions  . Lamictal [Lamotrigine] Other (See Comments)    Katherina Right Syndrome  . Cymbalta [Duloxetine Hcl]     Katherina Right syndrome  . Hydrocodone Nausea Only  . Mobic [Meloxicam]     Same ingredients as lamictal  . Sulfa Antibiotics Hives  . Brintellix [Vortioxetine] Anxiety  . Corticosteroids Rash  . Morphine Palpitations and Other (See Comments)  . Other     "MYCINS" - severe abdominal cramping   . Prednisone Rash       . Prozac [Fluoxetine Hcl] Palpitations  . Zoloft [Sertraline] Anxiety    Past Medical History:  Diagnosis Date  . ADD (attention deficit disorder)   . Anxiety   . Cancer [facility])    breast cancer  . Depression   . GERD (gastroesophageal reflux disease)   . Hiatal hernia   . History of radiation therapy 08/13/2018- 09/25/2018   Left Chest wall and IM nodes/ 50 Gy in 25 fractions, Left supraclaicular fossa and PAB/ 50 Gy in 25 fractions, Left chest wall scar boost/ 10 Gy in 5 fractions.   . Hyperlipemia, mixed   . Intermittent palpitations   . Malignant neoplasm of overlapping sites of left breast in female, estrogen receptor positive ([place]) 05/06/2018   oncologist-  dr Lindi Adie--  Invasive Lobular Cancer,  CIS, Stage IB, Grade 2 (pT3,pN1a,cM0), ER+---  s/p  left mastectomy with sln dissections and right mastectomy (benign)  . PONV (postoperative nausea and vomiting)   . Right thyroid nodule   . Shingles 12/2017    Past Surgical History:  Procedure Laterality Date  . AXILLARY LYMPH NODE DISSECTION Left 07/09/2018   Procedure: COMPLETION OF LEFT AXILLARY LYMPH NODE DISSECTION;  Surgeon: Jovita Kussmaul, MD;  Location: [facility];  Service: General;  Laterality: Left;  . BREAST EXCISIONAL BIOPSY Left 1995   benign  . CARDIOVASCULAR  STRESS TEST  06/21/2017   normal nuclear stress study w/ no ischemia/  normal LV function and wall function, nuclear stress ef 70%  . ELBOW SURGERY Left child  . MASTECTOMY W/ SENTINEL NODE BIOPSY Left 06/13/2018  . MASTECTOMY WITH RADIOACTIVE SEED GUIDED EXCISION AND AXILLARY SENTINEL LYMPH NODE BIOPSY Bilateral 06/13/2018   Procedure: LEFT MASTECTOMY WITH SENTINEL LYMPH NODE MAPPING AND TARGETED NODE DISSECTION AND RIGHT PROPHYLATIC MASTECTOMY;  Surgeon: Jovita Kussmaul, MD;  Location: [place];  Service: General;  Laterality: Bilateral;  . THYROIDECTOMY Left 10-31-2001   dr gerkin @[facility]   . TRANSTHORACIC ECHOCARDIOGRAM  06/21/2017   ef 60-65%/  trivial MR (no evidence mvp)  . TUBAL LIGATION  yrs ago  . VAGINAL HYSTERECTOMY  1990s    Family History  Problem Relation Age of Onset  . Hypertension Mother   . Heart attack Mother   . Breast cancer Mother   . Bipolar disorder Father   . Breast cancer Maternal Grandmother   . Breast cancer Cousin     Social History   Socioeconomic History  . Marital status: Married    Spouse name: Not on file  . Number of children: 2  . Years of education: 32  . Highest education level: High school graduate  Occupational History  . Not on file  Tobacco Use  . Smoking status: Former Smoker    Packs/day: 0.25    Years: 0.00    Pack years: 0.00    Types: Cigarettes    Quit date: 12/05/2017    Years since quitting: 2.0  . Smokeless tobacco: Never Used  Substance and Sexual Activity  . Alcohol use: Not Currently  . Drug use: No  . Sexual activity: Yes    Partners: Male    Birth control/protection: None  Other Topics Concern  . Not on file  Social History Narrative   Lives at home with husband.   Right-handed.   1 cup coffee per day, occasional soda or tea.   Social Determinants of Health   Financial Resource Strain:   . Difficulty of Paying Living Expenses:   Food Insecurity:   . Worried About Charity fundraiser in the Last Year:   . Arts development officer in the Last Year:   Transportation Needs:   . Film/video editor (Medical):   Marland Kitchen Lack of Transportation (Non-Medical):   Physical Activity:   . Days of Exercise per Week:   . Minutes of Exercise per Session:   Stress:   . Feeling of Stress :   Social Connections:   . Frequency of Communication with Friends and Family:   . Frequency of Social Gatherings with Friends and Family:   . Attends Religious Services:   . Active Member of Clubs or Organizations:   . Attends Archivist Meetings:   Marland Kitchen Marital Status:   Intimate Partner Violence:   . Fear of Current or Ex-Partner:   .  Emotionally Abused:   Marland Kitchen Physically Abused:   . Sexually Abused:     Past Medical History, Surgical history, Social history, and Family history were reviewed and updated as appropriate.   Please see review of systems for further details on the patient's review from today.   Review of Systems:  Review of Systems  Constitutional: Negative for chills, diaphoresis and fever.  HENT: Negative for trouble swallowing and voice change.   Respiratory: Negative for cough, chest tightness, shortness of breath and wheezing.   Cardiovascular: Negative for chest pain and palpitations.  Gastrointestinal: Negative for abdominal pain, constipation, diarrhea, nausea and vomiting.  Musculoskeletal: Positive for arthralgias and myalgias. Negative for back pain.  Neurological: Positive for weakness. Negative for dizziness, light-headedness and headaches.    Objective:   Physical Exam:  BP 120/85 (BP Location: Right Arm, Patient Position: Sitting)   Pulse 94   Temp 98.5 F (36.9 C) (Temporal)   Resp 18   Ht 5' 5.25" (1.657 m)   Wt 156 lb 12.8 oz (71.1 kg)   SpO2 100%   BMI 25.89 kg/m  ECOG: 0  Physical Exam Constitutional:      General: She is not in acute distress.    Appearance: She is not diaphoretic.  HENT:     Head: Normocephalic and atraumatic.  Musculoskeletal:        General:  Tenderness present. No swelling, deformity or signs of injury.       Arms:  Skin:    General: Skin is warm and dry.     Findings: No erythema or rash.  Neurological:     Mental Status: She is alert.     Coordination: Coordination normal.     Gait: Gait normal.  Psychiatric:        Mood and Affect: Mood normal.        Behavior: Behavior normal.        Thought Content: Thought content normal.        Judgment: Judgment normal.     Lab Review:     Component Value Date/Time   NA 141 04/29/2019 0946   NA 141 03/03/2018 0000   NA 141 03/03/2018 0000   K 4.0 04/29/2019 0946   CL 107 04/29/2019 0946   CO2 25 04/29/2019 0946   GLUCOSE 108 (H) 04/29/2019 0946   BUN 20 04/29/2019 0946   BUN 11 03/03/2018 0000   BUN 11 03/03/2018 0000   CREATININE 0.82 04/29/2019 0946   CALCIUM 8.6 (L) 04/29/2019 0946   PROT 6.8 04/29/2019 0946   ALBUMIN 4.2 04/29/2019 0946   AST 19 04/29/2019 0946   ALT 29 04/29/2019 0946   ALKPHOS 47 04/29/2019 0946   BILITOT 0.5 04/29/2019 0946   GFRNONAA >60 04/29/2019 0946   GFRAA >60 04/29/2019 0946       Component Value Date/Time   WBC 4.3 06/23/2019 0912   WBC 6.4 07/04/2018 1423   RBC 4.42 06/23/2019 0912   RBC 4.05 07/04/2018 1423   HGB 13.9 06/23/2019 0912   HCT 41.5 06/23/2019 0912   PLT 191 06/23/2019 0912   MCV 94 06/23/2019 0912   MCH 31.4 06/23/2019 0912   MCH 31.1 07/04/2018 1423   MCHC 33.5 06/23/2019 0912   MCHC 31.6 07/04/2018 1423   RDW 12.7 06/23/2019 0912   LYMPHSABS 1.2 06/23/2019 0912   MONOABS 0.3 05/21/2018 1203   EOSABS 0.1 06/23/2019 0912   BASOSABS 0.0 06/23/2019 0912   -------------------------------  Imaging from last 24 hours (if applicable):  Radiology interpretation: No results found.

## 2019-12-09 DIAGNOSIS — R29898 Other symptoms and signs involving the musculoskeletal system: Secondary | ICD-10-CM | POA: Insufficient documentation

## 2019-12-11 DIAGNOSIS — Z9011 Acquired absence of right breast and nipple: Secondary | ICD-10-CM | POA: Diagnosis not present

## 2019-12-11 DIAGNOSIS — C50912 Malignant neoplasm of unspecified site of left female breast: Secondary | ICD-10-CM | POA: Diagnosis not present

## 2019-12-14 ENCOUNTER — Other Ambulatory Visit: Payer: Self-pay

## 2019-12-14 ENCOUNTER — Other Ambulatory Visit: Payer: Self-pay | Admitting: Medical

## 2019-12-14 DIAGNOSIS — M7711 Lateral epicondylitis, right elbow: Secondary | ICD-10-CM

## 2019-12-14 MED ORDER — OMEPRAZOLE 40 MG PO CPDR: 40.0000 mg | DELAYED_RELEASE_CAPSULE | Freq: Every day | ORAL | 0 refills | Status: DC

## 2019-12-14 MED FILL — OMEPRAZOLE 40 MG CPDR: 40 | 90 days supply | Qty: 90 | Fill #1

## 2019-12-21 ENCOUNTER — Ambulatory Visit: Payer: 59 | Admitting: Family Medicine

## 2019-12-28 ENCOUNTER — Other Ambulatory Visit: Payer: Self-pay | Admitting: Family Medicine

## 2019-12-28 DIAGNOSIS — E785 Hyperlipidemia, unspecified: Secondary | ICD-10-CM

## 2019-12-28 MED FILL — TEMAZEPAM 30 MG CAPSULE: 30 | 30 days supply | Qty: 30 | Fill #1

## 2019-12-28 MED FILL — ROSUVASTATIN CALCIUM 10 MG: 10 | 90 days supply | Qty: 90 | Fill #0

## 2019-12-28 MED FILL — buPROPion HCL ER (XL) 150 M: 150 | 90 days supply | Qty: 90 | Fill #2

## 2019-12-29 ENCOUNTER — Encounter: Payer: Self-pay | Admitting: Psychiatry

## 2019-12-29 ENCOUNTER — Other Ambulatory Visit: Payer: Self-pay

## 2019-12-29 ENCOUNTER — Telehealth (INDEPENDENT_AMBULATORY_CARE_PROVIDER_SITE_OTHER): Payer: 59 | Admitting: Psychiatry

## 2019-12-29 DIAGNOSIS — F3342 Major depressive disorder, recurrent, in full remission: Secondary | ICD-10-CM

## 2019-12-29 DIAGNOSIS — F411 Generalized anxiety disorder: Secondary | ICD-10-CM | POA: Diagnosis not present

## 2019-12-29 DIAGNOSIS — F5105 Insomnia due to other mental disorder: Secondary | ICD-10-CM | POA: Diagnosis not present

## 2019-12-29 NOTE — Progress Notes (Signed)
Provider Location : [facility] Patient Location : Home  Virtual Visit via Video Note  I connected with Dawn Booker on 12/29/19 at  3:30 PM EDT by a video enabled telemedicine application and verified that I am speaking with the correct person using two identifiers.   I discussed the limitations of evaluation and management by telemedicine and the availability of in person appointments. The patient expressed understanding and agreed to proceed.     I discussed the assessment and treatment plan with the patient. The patient was provided an opportunity to ask questions and all were answered. The patient agreed with the plan and demonstrated an understanding of the instructions.   The patient was advised to call back or seek an in-person evaluation if the symptoms worsen or if the condition fails to improve as anticipated.   [facility] MD OP Progress Note  12/29/2019 4:38 PM ALYX KEPLEY  MRN:  [id number]  Chief Complaint:  Chief Complaint    Follow-up     HPI: Dawn Booker is a 64 year old Caucasian female, married, currently unemployed, lives in [place], has a history of MDD, GAD, insomnia, malignant neoplasm of left breast status post mastectomy, chemoradiation, gastroesophageal reflux disease, hiatal hernia was evaluated by telemedicine today.  Patient today reports she is doing well with regards to her depression and anxiety.  She reports after her visit with writer last last month she restarted taking the Viibryd as discussed.  She however reports that she had diarrhea and hence stopped taking it.  Currently she is only on the Wellbutrin and temazepam.  She is tolerating these 2 medications well.  She denies side effects.  She reports sleep is good.  She does have some restlessness on and off early morning since her puppy wakes her up and she has to take her out.  Otherwise she is doing okay on the temazepam.  She reports appetite is increased on and off.  However overall she is doing  okay.  She is currently struggling with right-sided elbow pain and has upcoming appointment with orthopedic provider.  She reports she otherwise is doing okay and denies suicidality, homicidality or perceptual disturbances.   Visit Diagnosis:    ICD-10-CM   1. MDD (major depressive disorder), recurrent, in full remission ([place])  F33.42   2. GAD (generalized anxiety disorder)  F41.1   3. Insomnia due to mental condition  F51.05     Past Psychiatric History: I have reviewed past psychiatric history from my progress note on 05/06/2019.  Past trials of Prozac, Zoloft, Celexa, Lexapro, Effexor, Cymbalta, Ambien, Lunesta, Brintellix, temazepam, Wellbutrin, lorazepam, Xanax, Viibryd.  Past Medical History:  Past Medical History:  Diagnosis Date  . ADD (attention deficit disorder)   . Anxiety   . Cancer [facility])    breast cancer  . Depression   . GERD (gastroesophageal reflux disease)   . Hiatal hernia   . History of radiation therapy 08/13/2018- 09/25/2018   Left Chest wall and IM nodes/ 50 Gy in 25 fractions, Left supraclaicular fossa and PAB/ 50 Gy in 25 fractions, Left chest wall scar boost/ 10 Gy in 5 fractions.   . Hyperlipemia, mixed   . Intermittent palpitations   . Malignant neoplasm of overlapping sites of left breast in female, estrogen receptor positive ([place]) 05/06/2018   oncologist-  dr Lindi Adie--  Invasive Lobular Cancer, CIS, Stage IB, Grade 2 (pT3,pN1a,cM0), ER+---  s/p  left mastectomy with sln dissections and right mastectomy (benign)  . PONV (postoperative nausea and vomiting)   .  Right thyroid nodule   . Shingles 12/2017    Past Surgical History:  Procedure Laterality Date  . AXILLARY LYMPH NODE DISSECTION Left 07/09/2018   Procedure: COMPLETION OF LEFT AXILLARY LYMPH NODE DISSECTION;  Surgeon: Jovita Kussmaul, MD;  Location: [facility];  Service: General;  Laterality: Left;  . BREAST EXCISIONAL BIOPSY Left 1995   benign  . CARDIOVASCULAR STRESS TEST  06/21/2017   normal  nuclear stress study w/ no ischemia/  normal LV function and wall function, nuclear stress ef 70%  . ELBOW SURGERY Left child  . MASTECTOMY W/ SENTINEL NODE BIOPSY Left 06/13/2018  . MASTECTOMY WITH RADIOACTIVE SEED GUIDED EXCISION AND AXILLARY SENTINEL LYMPH NODE BIOPSY Bilateral 06/13/2018   Procedure: LEFT MASTECTOMY WITH SENTINEL LYMPH NODE MAPPING AND TARGETED NODE DISSECTION AND RIGHT PROPHYLATIC MASTECTOMY;  Surgeon: Jovita Kussmaul, MD;  Location: [facility];  Service: General;  Laterality: Bilateral;  . THYROIDECTOMY Left 10-31-2001   dr gerkin @[facility]   . TRANSTHORACIC ECHOCARDIOGRAM  06/21/2017   ef 60-65%/  trivial MR (no evidence mvp)  . TUBAL LIGATION  yrs ago  . VAGINAL HYSTERECTOMY  1990s    Family Psychiatric History: I have reviewed family psychiatric history from my progress note on 05/06/2019  Family History:  Family History  Problem Relation Age of Onset  . Hypertension Mother   . Heart attack Mother   . Breast cancer Mother   . Bipolar disorder Father   . Breast cancer Maternal Grandmother   . Breast cancer Cousin     Social History: I have reviewed social history from my progress note on 05/06/2019 Social History   Socioeconomic History  . Marital status: Married    Spouse name: Not on file  . Number of children: 2  . Years of education: 31  . Highest education level: High school graduate  Occupational History  . Not on file  Tobacco Use  . Smoking status: Former Smoker    Packs/day: 0.25    Years: 0.00    Pack years: 0.00    Types: Cigarettes    Quit date: 12/05/2017    Years since quitting: 2.0  . Smokeless tobacco: Never Used  Substance and Sexual Activity  . Alcohol use: Not Currently  . Drug use: No  . Sexual activity: Yes    Partners: Male    Birth control/protection: None  Other Topics Concern  . Not on file  Social History Narrative   Lives at home with husband.   Right-handed.   1 cup coffee per day, occasional soda or tea.   Social  Determinants of Health   Financial Resource Strain:   . Difficulty of Paying Living Expenses:   Food Insecurity:   . Worried About Charity fundraiser in the Last Year:   . Arboriculturist in the Last Year:   Transportation Needs:   . Film/video editor (Medical):   Marland Kitchen Lack of Transportation (Non-Medical):   Physical Activity:   . Days of Exercise per Week:   . Minutes of Exercise per Session:   Stress:   . Feeling of Stress :   Social Connections:   . Frequency of Communication with Friends and Family:   . Frequency of Social Gatherings with Friends and Family:   . Attends Religious Services:   . Active Member of Clubs or Organizations:   . Attends Archivist Meetings:   Marland Kitchen Marital Status:     Allergies:  Allergies  Allergen Reactions  . Lamictal [  Lamotrigine] Other (See Comments)    Katherina Right Syndrome  . Cymbalta [Duloxetine Hcl]     Katherina Right syndrome  . Hydrocodone Nausea Only  . Mobic [Meloxicam]     Same ingredients as lamictal  . Sulfa Antibiotics Hives  . Brintellix [Vortioxetine] Anxiety  . Corticosteroids Rash  . Morphine Palpitations and Other (See Comments)  . Other     "MYCINS" - severe abdominal cramping   . Prednisone Rash       . Prozac [Fluoxetine Hcl] Palpitations  . Zoloft [Sertraline] Anxiety    Metabolic Disorder Labs: Lab Results  Component Value Date   HGBA1C 5.8 (H) 06/23/2019   No results found for: PROLACTIN Lab Results  Component Value Date   CHOL 155 04/29/2019   TRIG 76 04/29/2019   HDL 64 04/29/2019   CHOLHDL 2.4 04/29/2019   VLDL 15 04/29/2019   LDLCALC 76 04/29/2019   LDLCALC 59 03/05/2018   Lab Results  Component Value Date   TSH 2.100 06/23/2019   TSH 1.58 09/24/2018    Therapeutic Level Labs: No results found for: LITHIUM No results found for: VALPROATE No components found for:  CBMZ  Current Medications: Current Outpatient Medications  Medication Sig Dispense Refill  . acetaminophen  (TYLENOL) 500 MG tablet Take 1,000 mg by mouth at bedtime.     Marland Kitchen buPROPion (WELLBUTRIN XL) 150 MG 24 hr tablet Take 150 mg by mouth daily.    . cyclobenzaprine (FLEXERIL) 5 MG tablet Take 1 tablet (5 mg total) by mouth at bedtime. 21 tablet 0  . diphenhydrAMINE (BENADRYL) 25 mg capsule Take 25 mg by mouth at bedtime.    . folic acid (FOLVITE) A999333 MCG tablet Take 400 mcg by mouth daily.    Marland Kitchen L-Methylfolate 15 MG TABS     . LORazepam (ATIVAN) 0.5 MG tablet Take 1 tablet (0.5 mg total) by mouth every 8 (eight) hours as needed. for anxiety (Patient not taking: Reported on 07/14/2019) 30 tablet 1  . Magnesium 400 MG TABS Take 400 mg by mouth.    Marland Kitchen omeprazole (PRILOSEC) 40 MG capsule Take 1 capsule (40 mg total) by mouth daily. 90 capsule 0  . OVER THE COUNTER MEDICATION 2 tablets daily. Vegan Calcium 500    . rosuvastatin (CRESTOR) 10 MG tablet TAKE 1 TABLET BY MOUTH AT BEDTIME 90 tablet 0  . temazepam (RESTORIL) 30 MG capsule Take 1 capsule (30 mg total) by mouth at bedtime. 30 capsule 1  . Vitamin D, Ergocalciferol, (DRISDOL) 1.25 MG (50000 UT) CAPS capsule Take 1 capsule (50,000 Units total) by mouth every 7 (seven) days. 12 capsule 3   No current facility-administered medications for this visit.     Musculoskeletal: Strength & Muscle Tone: UTA Gait & Station: normal Patient leans: N/A  Psychiatric Specialty Exam: Review of Systems  Musculoskeletal:       Right sided elbow - pain  Psychiatric/Behavioral: Positive for sleep disturbance. Negative for agitation, behavioral problems, confusion, decreased concentration, dysphoric mood, hallucinations, self-injury and suicidal ideas. The patient is not nervous/anxious and is not hyperactive.   All other systems reviewed and are negative.   There were no vitals taken for this visit.There is no height or weight on file to calculate BMI.  General Appearance: Casual  Eye Contact:  Fair  Speech:  Normal Rate  Volume:  Normal  Mood:  Euthymic   Affect:  Congruent  Thought Process:  Goal Directed and Descriptions of Associations: Intact  Orientation:  Full (Time, Place, and  Person)  Thought Content: Logical   Suicidal Thoughts:  No  Homicidal Thoughts:  No  Memory:  Immediate;   Fair Recent;   Fair Remote;   Fair  Judgement:  Fair  Insight:  Fair  Psychomotor Activity:  Normal  Concentration:  Concentration: Fair and Attention Span: Fair  Recall:  [employer] of Knowledge: Fair  Language: Fair  Akathisia:  No  Handed:  Right  AIMS (if indicated):UTA  Assets:  Communication Skills Desire for Improvement Housing Social Support  ADL's:  Intact  Cognition: WNL  Sleep:  Restless due to having a puppy   Screenings: GAD-7     Office Visit from 05/06/2019 in [place]  Total GAD-7 Score  11    PHQ2-9     Video Visit from 12/29/2019 in [facility] Office Visit from 06/26/2019 in [facility] at [facility] Visit from 06/09/2019 in [facility] Visit from 05/06/2019 in [place]  PHQ-2 Total Score  1  2  2  3   PHQ-9 Total Score  3  9  10  17        Assessment and Plan: Dawn Booker is a 64 year old Caucasian female, married, unemployed, has a history of breast cancer, status post chemoradiation, mastectomy, gastroesophageal reflux disease, depression, anxiety, insomnia was evaluated by telemedicine today.  Patient is biologically predisposed given her family history, history of trauma.  Patient with psychosocial stressors of relationship struggles, cancer diagnosis.  Patient however is currently stable on current medication regimen.  Plan as noted below.  Plan MDD in remission Wellbutrin XL 150 mg p.o. daily Discontinue Viibryd for side effects PHQ 9 today equals 3  GAD-stable Continue to monitor closely  Insomnia-stable Temazepam 30 mg p.o. nightly. I have reviewed [place] controlled  substance database. She does have some restlessness because of waking up early in the morning to take care of her puppy.  Patient to work on sleep hygiene.  Discussed with patient that if she continues to do well she can be transitioned back to her primary care provider.  Follow-up in clinic in 3 months or sooner if needed.  I have spent atleast 20 minutes non face to face with patient today. More than 50 % of the time was spent for preparing to see the patient ( e.g., review of test, records ), ordering medications and test ,psychoeducation and supportive psychotherapy and care coordination,as well as documenting clinical information in electronic health record. This note was generated in part or whole with voice recognition software. Voice recognition is usually quite accurate but there are transcription errors that can and very often do occur. I apologize for any typographical errors that were not detected and corrected.          Ursula Alert, MD 12/29/2019, 4:38 PM

## 2019-12-30 ENCOUNTER — Ambulatory Visit: Payer: 59 | Admitting: Family Medicine

## 2019-12-30 ENCOUNTER — Encounter: Payer: Self-pay | Admitting: Family Medicine

## 2019-12-30 DIAGNOSIS — M25521 Pain in right elbow: Secondary | ICD-10-CM

## 2019-12-30 DIAGNOSIS — M25552 Pain in left hip: Secondary | ICD-10-CM

## 2019-12-30 MED ORDER — NITROGLYCERIN 0.1 MG/HR TD PT24: MEDICATED_PATCH | TRANSDERMAL | 3 refills | Status: DC

## 2019-12-30 MED FILL — NITROGLYCERIN 0.1 MG/HR PTC: 0.1 | 60 days supply | Qty: 30 | Fill #0

## 2019-12-30 NOTE — Progress Notes (Signed)
Office Visit Note   Patient: Dawn Booker           Date of Birth: November 04, 1955           MRN: [id number] Visit Date: 12/30/2019 Requested by: Mellody Dance, DO [place],  [place] [address] PCP: Mellody Dance, DO  Subjective: Chief Complaint  Patient presents with  . Right Elbow - Pain    Pain lateral elbow since early March of this year. NKI. Weakness in forearm/hand grip. Some days are better than others. Elbow will ache "like a toothache." Cannot do anything with that arm on those days. Right-hand dominant.    HPI: She is here with right elbow and left hip pain.  The elbow started bothering her in March.  She thinks it is from doing weed eating activities.  There was never one moment where she injured herself.  Pain on the lateral aspect with some pain toward the wrist as well.  It hurts when she flexes her wrist or when she grips something.  No numbness or tingling.  Ibuprofen has not helped.  Tennis elbow strap has not helped.  Her left hip continues to bother her.  Anterior pain when walking or moving around.  She has had 3 diagnostic injections now, none of them gave her any significant relief.  She has had 2 MRI scans which showed mild arthritic change but no other abnormality.               ROS:   All other systems were reviewed and are negative.  Objective: Vital Signs: There were no vitals taken for this visit.  Physical Exam:  General:  Alert and oriented, in no acute distress. Pulm:  Breathing unlabored. Psy:  Normal mood, congruent affect. Skin: No erythema or rash. Right elbow: Full active range of motion, no effusion.  Point tender at the common extensor tendon at the lateral epicondyle.  No significant tenderness at the radial tunnel.  She has pain with wrist extension and third finger extension against resistance as well as forearm pronation. Left hip: She has pain anteriorly when the hip is passively flexed and internally rotated.  Her  range of motion is still good.  She has similar pain when I do that on the right side.  No significant pain with resisted strength testing of her hip.  She has tenderness over the greater trochanter but it does not reproduce her pain.  Imaging: No results found.  Assessment & Plan: 1.  Right elbow lateral epicondylitis -We discussed various options and elected to try occupational therapy.  Nitroglycerin patch therapy as well. -Consider dextrose prolotherapy if symptoms persist.  2.  Chronic left anterior hip pain, etiology uncertain. -I do not think it is worth doing any more injections given her lack of improvement with 3 diagnostic injections.  We will try physical therapy.  If she fails to improve, we may need to do additional imaging.     Procedures: No procedures performed  No notes on file     PMFS History: Patient Active Problem List   Diagnosis Date Noted  . Insomnia 06/26/2019  . Glucose intolerance (impaired glucose tolerance) 06/26/2019  . Hyperlipidemia 06/26/2019  . Vitamin D deficiency 06/26/2019  . Osteopenia after menopause 06/26/2019  . Need for vaccination 06/09/2019  . H/O left mastectomy 06/09/2019  . MDD (major depressive disorder), recurrent episode, moderate ([place]) 05/06/2019  . GAD (generalized anxiety disorder) 05/06/2019  . Insomnia due to mental condition 05/06/2019  .  Cancer of left female breast  ([place]) 06/13/2018  . Malignant neoplasm of overlapping sites of left breast in female, estrogen receptor positive ([place]) 05/09/2018  . Blurring of visual image of both eyes 12/06/2017  . Postherpetic neuralgia 12/06/2017  . Nasal turbinate hypertrophy 10/09/2016  . Nasal congestion 08/31/2016  . Deviated septum 08/31/2016  . ESOPHAGEAL REFLUX 10/10/2009   Past Medical History:  Diagnosis Date  . ADD (attention deficit disorder)   . Anxiety   . Cancer [facility])    breast cancer  . Depression   . GERD (gastroesophageal reflux disease)   . Hiatal hernia    . History of radiation therapy 08/13/2018- 09/25/2018   Left Chest wall and IM nodes/ 50 Gy in 25 fractions, Left supraclaicular fossa and PAB/ 50 Gy in 25 fractions, Left chest wall scar boost/ 10 Gy in 5 fractions.   . Hyperlipemia, mixed   . Intermittent palpitations   . Malignant neoplasm of overlapping sites of left breast in female, estrogen receptor positive ([place]) 05/06/2018   oncologist-  dr Lindi Adie--  Invasive Lobular Cancer, CIS, Stage IB, Grade 2 (pT3,pN1a,cM0), ER+---  s/p  left mastectomy with sln dissections and right mastectomy (benign)  . PONV (postoperative nausea and vomiting)   . Right thyroid nodule   . Shingles 12/2017    Family History  Problem Relation Age of Onset  . Hypertension Mother   . Heart attack Mother   . Breast cancer Mother   . Bipolar disorder Father   . Breast cancer Maternal Grandmother   . Breast cancer Cousin     Past Surgical History:  Procedure Laterality Date  . AXILLARY LYMPH NODE DISSECTION Left 07/09/2018   Procedure: COMPLETION OF LEFT AXILLARY LYMPH NODE DISSECTION;  Surgeon: Jovita Kussmaul, MD;  Location: [facility];  Service: General;  Laterality: Left;  . BREAST EXCISIONAL BIOPSY Left 1995   benign  . CARDIOVASCULAR STRESS TEST  06/21/2017   normal nuclear stress study w/ no ischemia/  normal LV function and wall function, nuclear stress ef 70%  . ELBOW SURGERY Left child  . MASTECTOMY W/ SENTINEL NODE BIOPSY Left 06/13/2018  . MASTECTOMY WITH RADIOACTIVE SEED GUIDED EXCISION AND AXILLARY SENTINEL LYMPH NODE BIOPSY Bilateral 06/13/2018   Procedure: LEFT MASTECTOMY WITH SENTINEL LYMPH NODE MAPPING AND TARGETED NODE DISSECTION AND RIGHT PROPHYLATIC MASTECTOMY;  Surgeon: Jovita Kussmaul, MD;  Location: [facility];  Service: General;  Laterality: Bilateral;  . THYROIDECTOMY Left 10-31-2001   dr gerkin @[facility]   . TRANSTHORACIC ECHOCARDIOGRAM  06/21/2017   ef 60-65%/  trivial MR (no evidence mvp)  . TUBAL LIGATION  yrs ago  . VAGINAL HYSTERECTOMY   1990s   Social History   Occupational History  . Not on file  Tobacco Use  . Smoking status: Former Smoker    Packs/day: 0.25    Years: 0.00    Pack years: 0.00    Types: Cigarettes    Quit date: 12/05/2017    Years since quitting: 2.0  . Smokeless tobacco: Never Used  Substance and Sexual Activity  . Alcohol use: Not Currently  . Drug use: No  . Sexual activity: Yes    Partners: Male    Birth control/protection: None

## 2019-12-31 ENCOUNTER — Encounter: Payer: Self-pay | Admitting: Medical

## 2020-01-06 ENCOUNTER — Other Ambulatory Visit: Payer: Self-pay | Admitting: Medical

## 2020-01-06 DIAGNOSIS — M7711 Lateral epicondylitis, right elbow: Secondary | ICD-10-CM

## 2020-01-07 MED FILL — CYCLOBENZAPRINE HCL 5 MG TA: 5 | 21 days supply | Qty: 21 | Fill #0

## 2020-01-07 NOTE — Telephone Encounter (Signed)
Refill request

## 2020-01-11 DIAGNOSIS — M25631 Stiffness of right wrist, not elsewhere classified: Secondary | ICD-10-CM | POA: Diagnosis not present

## 2020-01-11 DIAGNOSIS — M25521 Pain in right elbow: Secondary | ICD-10-CM | POA: Diagnosis not present

## 2020-01-11 DIAGNOSIS — M6281 Muscle weakness (generalized): Secondary | ICD-10-CM | POA: Diagnosis not present

## 2020-01-11 DIAGNOSIS — R262 Difficulty in walking, not elsewhere classified: Secondary | ICD-10-CM | POA: Diagnosis not present

## 2020-01-11 DIAGNOSIS — M25552 Pain in left hip: Secondary | ICD-10-CM | POA: Diagnosis not present

## 2020-01-11 DIAGNOSIS — R531 Weakness: Secondary | ICD-10-CM | POA: Diagnosis not present

## 2020-01-11 DIAGNOSIS — R2689 Other abnormalities of gait and mobility: Secondary | ICD-10-CM | POA: Diagnosis not present

## 2020-01-11 DIAGNOSIS — M7711 Lateral epicondylitis, right elbow: Secondary | ICD-10-CM | POA: Diagnosis not present

## 2020-01-14 DIAGNOSIS — R262 Difficulty in walking, not elsewhere classified: Secondary | ICD-10-CM | POA: Diagnosis not present

## 2020-01-14 DIAGNOSIS — R531 Weakness: Secondary | ICD-10-CM | POA: Diagnosis not present

## 2020-01-14 DIAGNOSIS — M6281 Muscle weakness (generalized): Secondary | ICD-10-CM | POA: Diagnosis not present

## 2020-01-14 DIAGNOSIS — M7711 Lateral epicondylitis, right elbow: Secondary | ICD-10-CM | POA: Diagnosis not present

## 2020-01-14 DIAGNOSIS — R2689 Other abnormalities of gait and mobility: Secondary | ICD-10-CM | POA: Diagnosis not present

## 2020-01-14 DIAGNOSIS — M25631 Stiffness of right wrist, not elsewhere classified: Secondary | ICD-10-CM | POA: Diagnosis not present

## 2020-01-14 DIAGNOSIS — M25521 Pain in right elbow: Secondary | ICD-10-CM | POA: Diagnosis not present

## 2020-01-14 DIAGNOSIS — M25552 Pain in left hip: Secondary | ICD-10-CM | POA: Diagnosis not present

## 2020-01-25 ENCOUNTER — Other Ambulatory Visit: Payer: Self-pay | Admitting: Psychiatry

## 2020-01-25 DIAGNOSIS — F331 Major depressive disorder, recurrent, moderate: Secondary | ICD-10-CM

## 2020-01-25 DIAGNOSIS — F5105 Insomnia due to other mental disorder: Secondary | ICD-10-CM

## 2020-01-25 DIAGNOSIS — F411 Generalized anxiety disorder: Secondary | ICD-10-CM

## 2020-01-26 DIAGNOSIS — R262 Difficulty in walking, not elsewhere classified: Secondary | ICD-10-CM | POA: Diagnosis not present

## 2020-01-26 DIAGNOSIS — R531 Weakness: Secondary | ICD-10-CM | POA: Diagnosis not present

## 2020-01-26 DIAGNOSIS — M25631 Stiffness of right wrist, not elsewhere classified: Secondary | ICD-10-CM | POA: Diagnosis not present

## 2020-01-26 DIAGNOSIS — M7711 Lateral epicondylitis, right elbow: Secondary | ICD-10-CM | POA: Diagnosis not present

## 2020-01-26 DIAGNOSIS — R2689 Other abnormalities of gait and mobility: Secondary | ICD-10-CM | POA: Diagnosis not present

## 2020-01-26 DIAGNOSIS — M25552 Pain in left hip: Secondary | ICD-10-CM | POA: Diagnosis not present

## 2020-01-26 DIAGNOSIS — M25521 Pain in right elbow: Secondary | ICD-10-CM | POA: Diagnosis not present

## 2020-01-26 DIAGNOSIS — M6281 Muscle weakness (generalized): Secondary | ICD-10-CM | POA: Diagnosis not present

## 2020-01-26 MED FILL — TEMAZEPAM 30 MG CAPSULE: 30 | 30 days supply | Qty: 30 | Fill #0

## 2020-01-27 MED FILL — VIT D2 1.25 MG (50,000 UNIT: 1.25 MG | 84 days supply | Qty: 12 | Fill #3

## 2020-01-28 DIAGNOSIS — C50912 Malignant neoplasm of unspecified site of left female breast: Secondary | ICD-10-CM | POA: Diagnosis not present

## 2020-01-28 DIAGNOSIS — Z9011 Acquired absence of right breast and nipple: Secondary | ICD-10-CM | POA: Diagnosis not present

## 2020-02-16 ENCOUNTER — Other Ambulatory Visit: Payer: Self-pay | Admitting: Physician Assistant

## 2020-02-16 DIAGNOSIS — E785 Hyperlipidemia, unspecified: Secondary | ICD-10-CM

## 2020-02-16 DIAGNOSIS — Z0000 Encounter for general adult medical examination without abnormal findings: Secondary | ICD-10-CM

## 2020-02-19 ENCOUNTER — Other Ambulatory Visit: Payer: Self-pay

## 2020-02-19 ENCOUNTER — Other Ambulatory Visit: Payer: 59

## 2020-02-19 DIAGNOSIS — E785 Hyperlipidemia, unspecified: Secondary | ICD-10-CM

## 2020-02-19 DIAGNOSIS — Z0000 Encounter for general adult medical examination without abnormal findings: Secondary | ICD-10-CM

## 2020-02-20 LAB — CBC
Hematocrit: 41.8 % (ref 34.0–46.6)
Hemoglobin: 14 g/dL (ref 11.1–15.9)
MCH: 30.8 pg (ref 26.6–33.0)
MCHC: 33.5 g/dL (ref 31.5–35.7)
MCV: 92 fL (ref 79–97)
Platelets: 184 10*3/uL (ref 150–450)
RBC: 4.55 x10E6/uL (ref 3.77–5.28)
RDW: 12.6 % (ref 11.7–15.4)
WBC: 4.5 10*3/uL (ref 3.4–10.8)

## 2020-02-20 LAB — LIPID PANEL
Chol/HDL Ratio: 2.4 ratio (ref 0.0–4.4)
Cholesterol, Total: 137 mg/dL (ref 100–199)
HDL: 57 mg/dL (ref >39)
LDL Chol Calc (NIH): 67 mg/dL (ref 0–99)
Triglycerides: 65 mg/dL (ref 0–149)
VLDL Cholesterol Cal: 13 mg/dL (ref 5–40)

## 2020-02-20 LAB — COMPREHENSIVE METABOLIC PANEL
ALT: 21 IU/L (ref 0–32)
AST: 14 IU/L (ref 0–40)
Albumin/Globulin Ratio: 2.3 — ABNORMAL HIGH (ref 1.2–2.2)
Albumin: 4.5 g/dL (ref 3.8–4.8)
Alkaline Phosphatase: 68 IU/L (ref 48–121)
BUN/Creatinine Ratio: 16 (ref 12–28)
BUN: 13 mg/dL (ref 8–27)
Bilirubin Total: 0.4 mg/dL (ref 0.0–1.2)
CO2: 23 mmol/L (ref 20–29)
Calcium: 8.9 mg/dL (ref 8.7–10.3)
Chloride: 104 mmol/L (ref 96–106)
Creatinine, Ser: 0.81 mg/dL (ref 0.57–1.00)
GFR calc Af Amer: 89 mL/min/{1.73_m2} (ref >59)
GFR calc non Af Amer: 78 mL/min/{1.73_m2} (ref >59)
Globulin, Total: 2 g/dL (ref 1.5–4.5)
Glucose: 116 mg/dL — ABNORMAL HIGH (ref 65–99)
Potassium: 4.2 mmol/L (ref 3.5–5.2)
Sodium: 139 mmol/L (ref 134–144)
Total Protein: 6.5 g/dL (ref 6.0–8.5)

## 2020-02-20 LAB — HEMOGLOBIN A1C
Est. average glucose Bld gHb Est-mCnc: 120 mg/dL
Hgb A1c MFr Bld: 5.8 % — ABNORMAL HIGH (ref 4.8–5.6)

## 2020-02-20 LAB — TSH: TSH: 2.65 u[IU]/mL (ref 0.450–4.500)

## 2020-02-21 NOTE — Progress Notes (Signed)
Female Physical   Impression and Recommendations:    No diagnosis found.   1) Anticipatory Guidance: Discussed skin CA prevention and sunscreen when outside along with skin surveillance; eating a balanced and modest diet; physical activity at least 25 minutes per day or minimum of 150 min/ week moderate to intense activity. - Currently physical activity is limited due to labral tear and is in the process of restarting PT.  2) Immunizations / Screenings / Labs:   All immunizations are up-to-date per recommendations or will be updated today if pt allows.    - Patient understands with dental and vision screens they will schedule independently.  - Will obtain CBC, CMP, HgA1c, Lipid panel, and TSH when fasting, if not already done past 12 mo/ recently  - UTD on Tdap - UTD on Shingrix: completed series. - Declined Hep C and HIV screening.  3) Weight:  BMI meaning discussed with patient.  Discussed goal to improve diet habits to improve overall feelings of well being and objective health data. Improve nutrient density of diet through increasing intake of fruits and vegetables and decreasing saturated fats, white flour products and refined sugars. - Follow heart healthy diet   4) Healthcare Maintenance: - Continue current medication regimen. - Discussed lab results- most were essentially WNL, A1c stable in pre-diabetes range, so recommend to follow heart healthy/Mediterranean diet and stay as active as possible. - Stay well hydrated, at least 64 fl oz - Encouraged good sleep hygiene to help with cognition. If symptoms don't improve, recommend scheduling OV for further evaluation. - Reviewed patient's last [facility] visit and Dr. Shea Evans states patient can transition back to PCP for continued management if she continues to do well, and I am agreeable given patient's history and psychosocial stressors.  No orders of the defined types were placed in this encounter.   No orders of the defined types  were placed in this encounter.    No follow-ups on file.    Gross side effects, risk and benefits, and alternatives of medications discussed with patient.  Patient is aware that all medications have potential side effects and we are unable to predict every side effect or drug-drug interaction that may occur.  Expresses verbal understanding and consents to current therapy plan and treatment regimen.  F-up preventative CPE in 1 year- this is in addition to any chronic care visits.     Please see orders placed and AVS handed out to patient at the end of our visit for further patient instructions/ counseling done pertaining to today's office visit.    Subjective:     CPE HPI: Dawn Booker is a 64 y.o. female who presents to [place] at [facility] today for a yearly health maintenance exam.   Health Maintenance Summary  - Reviewed and updated, unless pt declines services.  Last Cologuard or Colonoscopy:  07/24/2019, sessile polys found, repeat in 7-10 yrs Family history of Colon CA: N  Tobacco History Reviewed:  Y, former smoker CT scan for screening lung CA: N/A Alcohol and/or drug use: No concerns; no use Dental Home: Y Eye exams: Y Dermatology home: Y  Female Health:  PAP Smear - last known results: s/p hysterectomy STD concerns: none, monogamous Lumps or breast concerns:  None, s/p double mastectomy  Breast Cancer Family History: Y Bone/ DEXA scan: N/A   Additional concerns beyond health maintenance issues:  - Poor concentration and at times confusion   Immunization History  Administered Date(s) Administered  . Influenza,inj,Quad  PF,6+ Mos 06/09/2019  . Tdap 06/14/2014  . Zoster Recombinat (Shingrix) 06/26/2019, 09/01/2019     Health Maintenance  Topic Date Due  . COVID-19 Vaccine (1) Never done  . Hepatitis C Screening  06/08/2020 (Originally 1955/10/28)  . HIV Screening  06/08/2020 (Originally 06/26/1971)  . INFLUENZA VACCINE   04/03/2020  . MAMMOGRAM  04/25/2020  . TETANUS/TDAP  06/14/2024  . COLONOSCOPY  07/23/2029  . PAP SMEAR-Modifier  Discontinued     Wt Readings from Last 3 Encounters:  12/08/19 156 lb 12.8 oz (71.1 kg)  06/26/19 159 lb (72.1 kg)  06/09/19 159 lb (72.1 kg)   BP Readings from Last 3 Encounters:  12/08/19 120/85  06/26/19 129/89  06/09/19 137/88   Pulse Readings from Last 3 Encounters:  12/08/19 94  06/26/19 88  06/09/19 89     Past Medical History:  Diagnosis Date  . ADD (attention deficit disorder)   . Anxiety   . Cancer [facility])    breast cancer  . Depression   . GERD (gastroesophageal reflux disease)   . Hiatal hernia   . History of radiation therapy 08/13/2018- 09/25/2018   Left Chest wall and IM nodes/ 50 Gy in 25 fractions, Left supraclaicular fossa and PAB/ 50 Gy in 25 fractions, Left chest wall scar boost/ 10 Gy in 5 fractions.   . Hyperlipemia, mixed   . Intermittent palpitations   . Malignant neoplasm of overlapping sites of left breast in female, estrogen receptor positive ([place]) 05/06/2018   oncologist-  dr Lindi Adie--  Invasive Lobular Cancer, CIS, Stage IB, Grade 2 (pT3,pN1a,cM0), ER+---  s/p  left mastectomy with sln dissections and right mastectomy (benign)  . PONV (postoperative nausea and vomiting)   . Right thyroid nodule   . Shingles 12/2017      Past Surgical History:  Procedure Laterality Date  . AXILLARY LYMPH NODE DISSECTION Left 07/09/2018   Procedure: COMPLETION OF LEFT AXILLARY LYMPH NODE DISSECTION;  Surgeon: Jovita Kussmaul, MD;  Location: [facility];  Service: General;  Laterality: Left;  . BREAST EXCISIONAL BIOPSY Left 1995   benign  . CARDIOVASCULAR STRESS TEST  06/21/2017   normal nuclear stress study w/ no ischemia/  normal LV function and wall function, nuclear stress ef 70%  . ELBOW SURGERY Left child  . MASTECTOMY W/ SENTINEL NODE BIOPSY Left 06/13/2018  . MASTECTOMY WITH RADIOACTIVE SEED GUIDED EXCISION AND AXILLARY SENTINEL LYMPH NODE  BIOPSY Bilateral 06/13/2018   Procedure: LEFT MASTECTOMY WITH SENTINEL LYMPH NODE MAPPING AND TARGETED NODE DISSECTION AND RIGHT PROPHYLATIC MASTECTOMY;  Surgeon: Jovita Kussmaul, MD;  Location: [facility];  Service: General;  Laterality: Bilateral;  . THYROIDECTOMY Left 10-31-2001   dr gerkin @[facility]   . TRANSTHORACIC ECHOCARDIOGRAM  06/21/2017   ef 60-65%/  trivial MR (no evidence mvp)  . TUBAL LIGATION  yrs ago  . VAGINAL HYSTERECTOMY  1990s      Family History  Problem Relation Age of Onset  . Hypertension Mother   . Heart attack Mother   . Breast cancer Mother   . Bipolar disorder Father   . Breast cancer Maternal Grandmother   . Breast cancer Cousin       Social History   Substance and Sexual Activity  Drug Use No  ,   Social History   Substance and Sexual Activity  Alcohol Use Not Currently  ,   Social History   Tobacco Use  Smoking Status Former Smoker  . Packs/day: 0.25  . Years: 0.00  . Pack years:  0.00  . Types: Cigarettes  . Quit date: 12/05/2017  . Years since quitting: 2.2  Smokeless Tobacco Never Used  ,   Social History   Substance and Sexual Activity  Sexual Activity Yes  . Partners: Male  . Birth control/protection: None    Current Outpatient Medications on File Prior to Visit  Medication Sig Dispense Refill  . acetaminophen (TYLENOL) 500 MG tablet Take 1,000 mg by mouth at bedtime.     Marland Kitchen buPROPion (WELLBUTRIN XL) 150 MG 24 hr tablet Take 150 mg by mouth daily.    . cyclobenzaprine (FLEXERIL) 5 MG tablet TAKE 1 TABLET BY MOUTH EVERY NIGHT AT BEDTIME 21 tablet 0  . diphenhydrAMINE (BENADRYL) 25 mg capsule Take 25 mg by mouth at bedtime.    . folic acid (FOLVITE) 191 MCG tablet Take 400 mcg by mouth daily.    Marland Kitchen L-Methylfolate 15 MG TABS     . LORazepam (ATIVAN) 0.5 MG tablet Take 1 tablet (0.5 mg total) by mouth every 8 (eight) hours as needed. for anxiety (Patient not taking: Reported on 07/14/2019) 30 tablet 1  . Magnesium 400 MG TABS Take 400  mg by mouth.    . nitroGLYCERIN (NITRO-DUR) 0.1 mg/hr patch Apply 1/4 patch to affected area 12 hours daily 30 patch 3  . omeprazole (PRILOSEC) 40 MG capsule Take 1 capsule (40 mg total) by mouth daily. 90 capsule 0  . OVER THE COUNTER MEDICATION 2 tablets daily. Vegan Calcium 500    . rosuvastatin (CRESTOR) 10 MG tablet TAKE 1 TABLET BY MOUTH AT BEDTIME 90 tablet 0  . temazepam (RESTORIL) 30 MG capsule TAKE 1 CAPSULE BY MOUTH AT BEDTIME 30 capsule 1  . Vitamin D, Ergocalciferol, (DRISDOL) 1.25 MG (50000 UT) CAPS capsule Take 1 capsule (50,000 Units total) by mouth every 7 (seven) days. 12 capsule 3   No current facility-administered medications on file prior to visit.    Allergies: Lamictal [lamotrigine], Cymbalta [duloxetine hcl], Hydrocodone, Mobic [meloxicam], Sulfa antibiotics, Brintellix [vortioxetine], Corticosteroids, Morphine, Other, Prednisone, Prozac [fluoxetine hcl], and Zoloft [sertraline]  Review of Systems: General:   Denies fever, chills, unexplained weight loss.  Optho/Auditory:   Denies visual changes, blurred vision/LOV Respiratory:   Denies SOB, DOE more than baseline levels.   Cardiovascular:   Denies chest pain, palpitations, new onset peripheral edema  Gastrointestinal:   Denies nausea, vomiting, diarrhea.  Genitourinary: Denies dysuria, freq/ urgency, flank pain or discharge from genitals.  Endocrine:     Denies hot or cold intolerance, polyuria, polydipsia. Musculoskeletal:   Denies unexplained myalgias, joint swelling, unexplained arthralgias, gait problems.  Skin:  Denies rash, suspicious lesions Neurological:     Denies dizziness, unexplained weakness, numbness  Psychiatric/Behavioral:   Denies mood changes, suicidal or homicidal ideations, hallucinations    Objective:    There were no vitals taken for this visit. There is no height or weight on file to calculate BMI. General Appearance:    Alert, cooperative, no distress, appears stated age  Head:     Normocephalic, without obvious abnormality, atraumatic  Eyes:    PERRL, conjunctiva/corneas clear, EOM's intact, fundi    benign, both eyes  Ears:    Normal TM's and external ear canals, both ears  Nose:   Nares normal, septum midline, mucosa normal, no drainage    or sinus tenderness  Throat:   Lips w/o lesion, mucosa moist, and tongue normal; teeth and   gums normal  Neck:   Supple, symmetrical, trachea midline, no adenopathy;  thyroid:  no enlargement/tenderness/nodules; no carotid   bruit or JVD  Back:     Symmetric, no curvature, ROM normal, no CVA tenderness  Lungs:     Clear to auscultation bilaterally, respirations unlabored, no       Wh/ R/ R  Chest Wall:    No tenderness or gross deformity; normal excursion   Heart:    Regular rate and rhythm, S1 and S2 normal, no murmur, rub   or gallop  Breast Exam:    Well healed scar noted bilaterally.    Abdomen:     Soft, mild tenderness of epigastric area, NBS, No G/R/R, no masses, no organomegaly  Genitalia:    Deferred.  Rectal:    Deferred.  Extremities:   Extremities normal, atraumatic, no cyanosis or gross edema  Pulses:   Good pulses  Skin:   Warm, dry, Skin color, texture, turgor normal, no obvious rashes or lesions Psych: No HI/SI, judgement and insight good, Euthymic mood. Full Affect.  Neurologic:   CNII-XII grossly intact, normal strength, sensation and reflexes throughout

## 2020-02-22 ENCOUNTER — Other Ambulatory Visit: Payer: Self-pay

## 2020-02-22 ENCOUNTER — Encounter: Payer: Self-pay | Admitting: Physician Assistant

## 2020-02-22 ENCOUNTER — Ambulatory Visit (INDEPENDENT_AMBULATORY_CARE_PROVIDER_SITE_OTHER): Payer: 59 | Admitting: Physician Assistant

## 2020-02-22 VITALS — BP 130/90 | HR 83 | Temp 98.1°F | Ht 65.25 in | Wt 155.6 lb

## 2020-02-22 DIAGNOSIS — F411 Generalized anxiety disorder: Secondary | ICD-10-CM

## 2020-02-22 DIAGNOSIS — Z9012 Acquired absence of left breast and nipple: Secondary | ICD-10-CM

## 2020-02-22 DIAGNOSIS — F331 Major depressive disorder, recurrent, moderate: Secondary | ICD-10-CM

## 2020-02-22 DIAGNOSIS — E785 Hyperlipidemia, unspecified: Secondary | ICD-10-CM | POA: Diagnosis not present

## 2020-02-22 DIAGNOSIS — F5105 Insomnia due to other mental disorder: Secondary | ICD-10-CM

## 2020-02-22 DIAGNOSIS — Z0000 Encounter for general adult medical examination without abnormal findings: Secondary | ICD-10-CM | POA: Diagnosis not present

## 2020-02-22 MED ORDER — TEMAZEPAM 30 MG PO CAPS: 30.0000 mg | ORAL_CAPSULE | Freq: Every day | ORAL | 1 refills | Status: DC

## 2020-02-22 MED FILL — TEMAZEPAM 30 MG CAPSULE: 30 | 30 days supply | Qty: 30 | Fill #0

## 2020-02-22 NOTE — Patient Instructions (Signed)
Preventive Care 12-64 Years Old, Female Preventive care refers to visits with your health care provider and lifestyle choices that can promote health and wellness. This includes:  A yearly physical exam. This may also be called an annual well check.  Regular dental visits and eye exams.  Immunizations.  Screening for certain conditions.  Healthy lifestyle choices, such as eating a healthy diet, getting regular exercise, not using drugs or products that contain nicotine and tobacco, and limiting alcohol use. What can I expect for my preventive care visit? Physical exam Your health care provider will check your:  Height and weight. This may be used to calculate body mass index (BMI), which tells if you are at a healthy weight.  Heart rate and blood pressure.  Skin for abnormal spots. Counseling Your health care provider may ask you questions about your:  Alcohol, tobacco, and drug use.  Emotional well-being.  Home and relationship well-being.  Sexual activity.  Eating habits.  Work and work Statistician.  Method of birth control.  Menstrual cycle.  Pregnancy history. What immunizations do I need?  Influenza (flu) vaccine  This is recommended every year. Tetanus, diphtheria, and pertussis (Tdap) vaccine  You may need a Td booster every 10 years. Varicella (chickenpox) vaccine  You may need this if you have not been vaccinated. Zoster (shingles) vaccine  You may need this after age 64. Measles, mumps, and rubella (MMR) vaccine  You may need at least one dose of MMR if you were born in 1957 or later. You may also need a second dose. Pneumococcal conjugate (PCV13) vaccine  You may need this if you have certain conditions and were not previously vaccinated. Pneumococcal polysaccharide (PPSV23) vaccine  You may need one or two doses if you smoke cigarettes or if you have certain conditions. Meningococcal conjugate (MenACWY) vaccine  You may need this if you  have certain conditions. Hepatitis A vaccine  You may need this if you have certain conditions or if you travel or work in places where you may be exposed to hepatitis A. Hepatitis B vaccine  You may need this if you have certain conditions or if you travel or work in places where you may be exposed to hepatitis B. Haemophilus influenzae type b (Hib) vaccine  You may need this if you have certain conditions. Human papillomavirus (HPV) vaccine  If recommended by your health care provider, you may need three doses over 6 months. You may receive vaccines as individual doses or as more than one vaccine together in one shot (combination vaccines). Talk with your health care provider about the risks and benefits of combination vaccines. What tests do I need? Blood tests  Lipid and cholesterol levels. These may be checked every 5 years, or more frequently if you are over 49 years old.  Hepatitis C test.  Hepatitis B test. Screening  Lung cancer screening. You may have this screening every year starting at age 32 if you have a 30-pack-year history of smoking and currently smoke or have quit within the past 15 years.  Colorectal cancer screening. All adults should have this screening starting at age 55 and continuing until age 7. Your health care provider may recommend screening at age 21 if you are at increased risk. You will have tests every 1-10 years, depending on your results and the type of screening test.  Diabetes screening. This is done by checking your blood sugar (glucose) after you have not eaten for a while (fasting). You may have this  done every 1-3 years.  Mammogram. This may be done every 1-2 years. Talk with your health care provider about when you should start having regular mammograms. This may depend on whether you have a family history of breast cancer.  BRCA-related cancer screening. This may be done if you have a family history of breast, ovarian, tubal, or peritoneal  cancers.  Pelvic exam and Pap test. This may be done every 3 years starting at age 40. Starting at age 66, this may be done every 5 years if you have a Pap test in combination with an HPV test. Other tests  Sexually transmitted disease (STD) testing.  Bone density scan. This is done to screen for osteoporosis. You may have this scan if you are at high risk for osteoporosis. Follow these instructions at home: Eating and drinking  Eat a diet that includes fresh fruits and vegetables, whole grains, lean protein, and low-fat dairy.  Take vitamin and mineral supplements as recommended by your health care provider.  Do not drink alcohol if: ? Your health care provider tells you not to drink. ? You are pregnant, may be pregnant, or are planning to become pregnant.  If you drink alcohol: ? Limit how much you have to 0-1 drink a day. ? Be aware of how much alcohol is in your drink. In the [place] one drink equals one 12 oz bottle of beer (355 mL), one 5 oz glass of wine (148 mL), or one 1 oz glass of hard liquor (44 mL). Lifestyle  Take daily care of your teeth and gums.  Stay active. Exercise for at least 30 minutes on 5 or more days each week.  Do not use any products that contain nicotine or tobacco, such as cigarettes, e-cigarettes, and chewing tobacco. If you need help quitting, ask your health care provider.  If you are sexually active, practice safe sex. Use a condom or other form of birth control (contraception) in order to prevent pregnancy and STIs (sexually transmitted infections).  If told by your health care provider, take low-dose aspirin daily starting at age 105. What's next?  Visit your health care provider once a year for a well check visit.  Ask your health care provider how often you should have your eyes and teeth checked.  Stay up to date on all vaccines. This information is not intended to replace advice given to you by your health care provider. Make sure you  discuss any questions you have with your health care provider. Document Revised: 05/01/2018 Document Reviewed: 05/01/2018 Elsevier Patient Education  2020 [employer].

## 2020-03-27 ENCOUNTER — Encounter: Payer: Self-pay | Admitting: Physician Assistant

## 2020-03-27 DIAGNOSIS — E785 Hyperlipidemia, unspecified: Secondary | ICD-10-CM

## 2020-03-27 DIAGNOSIS — F331 Major depressive disorder, recurrent, moderate: Secondary | ICD-10-CM

## 2020-03-27 DIAGNOSIS — F5105 Insomnia due to other mental disorder: Secondary | ICD-10-CM

## 2020-03-27 DIAGNOSIS — F411 Generalized anxiety disorder: Secondary | ICD-10-CM

## 2020-03-28 MED ORDER — BUPROPION HCL ER (XL) 150 MG PO TB24: 150.0000 mg | ORAL_TABLET | Freq: Every day | ORAL | 0 refills | Status: DC

## 2020-03-28 MED ORDER — ROSUVASTATIN CALCIUM 10 MG PO TABS: 10.0000 mg | ORAL_TABLET | Freq: Every day | ORAL | 0 refills | Status: DC

## 2020-03-28 MED ORDER — OMEPRAZOLE 40 MG PO CPDR: 40.0000 mg | DELAYED_RELEASE_CAPSULE | Freq: Every day | ORAL | 0 refills | Status: DC

## 2020-03-28 MED ORDER — TEMAZEPAM 30 MG PO CAPS: 30.0000 mg | ORAL_CAPSULE | Freq: Every day | ORAL | 0 refills | Status: DC

## 2020-03-31 ENCOUNTER — Telehealth: Payer: Self-pay

## 2020-03-31 ENCOUNTER — Ambulatory Visit: Payer: 59 | Admitting: Psychiatry

## 2020-03-31 NOTE — Telephone Encounter (Signed)
Called pt and advised her that all medications were sent to [facility] in [place], [place] and explained that it was our error that we inadvertently sent omeprazole to [facility] rather than [facility].  Advised pt that [facility] can transfer the omeprazole RX to their pharmacy from [place].  Pt expressed understanding.  Charyl Bigger, CMA

## 2020-04-07 ENCOUNTER — Telehealth: Payer: Self-pay | Admitting: *Deleted

## 2020-04-07 NOTE — Telephone Encounter (Signed)
She does not need a breast MRI.

## 2020-04-07 NOTE — Telephone Encounter (Signed)
Dawn Booker called to say she needs a Dexa order sent to [facility]  She also wants to know if she needs to have a yearly MRI. Please advise

## 2020-04-08 NOTE — Telephone Encounter (Signed)
Notified of message below

## 2020-04-11 ENCOUNTER — Other Ambulatory Visit: Payer: Self-pay | Admitting: Hematology and Oncology

## 2020-04-11 DIAGNOSIS — Z79811 Long term (current) use of aromatase inhibitors: Secondary | ICD-10-CM

## 2020-04-11 DIAGNOSIS — Z780 Asymptomatic menopausal state: Secondary | ICD-10-CM

## 2020-05-02 ENCOUNTER — Telehealth: Payer: Self-pay | Admitting: Physician Assistant

## 2020-05-02 MED ORDER — VITAMIN D (ERGOCALCIFEROL) 1.25 MG (50000 UNIT) PO CAPS: 50000.0000 [IU] | ORAL_CAPSULE | ORAL | 3 refills | Status: DC

## 2020-05-02 NOTE — Addendum Note (Signed)
Addended by: Mickel Crow on: 05/02/2020 04:12 PM   Modules accepted: Orders

## 2020-05-02 NOTE — Telephone Encounter (Signed)
Patient would like her Vitamin D refill sent to [facility] in [place]. Thanks

## 2020-05-02 NOTE — Telephone Encounter (Signed)
Refill sent to requested pharmacy. AS, CMA 

## 2020-05-13 ENCOUNTER — Telehealth: Payer: Self-pay | Admitting: Unknown Physician Specialty

## 2020-05-13 ENCOUNTER — Telehealth: Payer: Self-pay | Admitting: Physician Assistant

## 2020-05-13 ENCOUNTER — Telehealth: Payer: Self-pay | Admitting: *Deleted

## 2020-05-13 NOTE — Telephone Encounter (Signed)
Dr Lindi Adie is OK for her to get infusion- will route to infusion clinic/Lindsey Delice Bison, NP

## 2020-05-13 NOTE — Telephone Encounter (Signed)
Patient was vaccinated for COVID but has since became COVID positive and has some clinical questions she wants to discuss. Please contact when available.

## 2020-05-13 NOTE — Telephone Encounter (Signed)
Left message for patient to call back. AS, CMA 

## 2020-05-13 NOTE — Telephone Encounter (Signed)
Called to Discuss with patient about Covid symptoms and the use of the monoclonal antibody infusion for those with mild to moderate Covid symptoms and at a high risk of hospitalization.     Pt appears to qualify for this infusion due to co-morbid conditions and/or a member of an at-risk group in accordance with the FDA Emergency Use Authorization.    Unable to reach pt    

## 2020-05-13 NOTE — Telephone Encounter (Signed)
Dawn Booker left a message stating she tested positive for Covid on Wednesday. Wants to know if she is OK to get the infusion

## 2020-05-13 NOTE — Telephone Encounter (Signed)
Patient is asking if she qualifies for infusion since she is COVID positive. Patient denies SOB or difficulty breathing. No fever at this time.   Patient has oncologist and we advised her to contact oncology to verify if she qualifies and for them to set her up if so. AS, CMA

## 2020-05-15 ENCOUNTER — Telehealth: Payer: Self-pay | Admitting: Unknown Physician Specialty

## 2020-05-15 NOTE — Telephone Encounter (Signed)
I connected by phone with Dawn Booker on 05/15/2020 at 1:58 PM to discuss the potential use of a new treatment for mild to moderate COVID-19 viral infection in non-hospitalized patients.  This patient is a 64 y.o. female that meets the FDA criteria for Emergency Use Authorization of COVID monoclonal antibody casirivimab/imdevimab.  Has a (+) direct SARS-CoV-2 viral test result  Has mild or moderate COVID-19   Is NOT hospitalized due to COVID-19  Is within 10 days of symptom onset  Has at least one of the high risk factor(s) for progression to severe COVID-19 and/or hospitalization as defined in EUA.  Specific high risk criteria : BMI > 25   I have spoken and communicated the following to the patient or parent/caregiver regarding COVID monoclonal antibody treatment:  1. FDA has authorized the emergency use for the treatment of mild to moderate COVID-19 in adults and pediatric patients with positive results of direct SARS-CoV-2 viral testing who are 16 years of age and older weighing at least 40 kg, and who are at high risk for progressing to severe COVID-19 and/or hospitalization.  2. The significant known and potential risks and benefits of COVID monoclonal antibody, and the extent to which such potential risks and benefits are unknown.  3. Information on available alternative treatments and the risks and benefits of those alternatives, including clinical trials.  4. Patients treated with COVID monoclonal antibody should continue to self-isolate and use infection control measures (e.g., wear mask, isolate, social distance, avoid sharing personal items, clean and disinfect "high touch" surfaces, and frequent handwashing) according to CDC guidelines.   5. The patient or parent/caregiver has the option to accept or refuse COVID monoclonal antibody treatment.  After reviewing this information with the patient, The patient has DECLINED offer to receive the infusion. Dawn Booker 05/15/2020 1:58 PM  Sx onset 9/5

## 2020-06-08 ENCOUNTER — Other Ambulatory Visit: Payer: Self-pay

## 2020-06-08 ENCOUNTER — Ambulatory Visit (INDEPENDENT_AMBULATORY_CARE_PROVIDER_SITE_OTHER): Payer: 59

## 2020-06-08 VITALS — BP 116/81 | HR 90 | Ht 66.5 in | Wt 154.0 lb

## 2020-06-08 DIAGNOSIS — Z23 Encounter for immunization: Secondary | ICD-10-CM | POA: Diagnosis not present

## 2020-06-08 NOTE — Progress Notes (Signed)
Vaccine given. Patient tolerated well. VIS given. AS, CMA

## 2020-06-26 ENCOUNTER — Other Ambulatory Visit: Payer: Self-pay | Admitting: Physician Assistant

## 2020-06-26 DIAGNOSIS — F5105 Insomnia due to other mental disorder: Secondary | ICD-10-CM

## 2020-06-26 DIAGNOSIS — F331 Major depressive disorder, recurrent, moderate: Secondary | ICD-10-CM

## 2020-06-26 DIAGNOSIS — E785 Hyperlipidemia, unspecified: Secondary | ICD-10-CM

## 2020-06-26 DIAGNOSIS — F411 Generalized anxiety disorder: Secondary | ICD-10-CM

## 2020-06-27 ENCOUNTER — Other Ambulatory Visit: Payer: Self-pay | Admitting: Physician Assistant

## 2020-07-05 ENCOUNTER — Other Ambulatory Visit: Payer: 59

## 2020-08-03 NOTE — Assessment & Plan Note (Signed)
06/13/2018:Bilateral mastectomies: Left: ILC, grade 2, 5.4 cm, LCIS, margins negative, lymphovascular invasion present, 2/7 lymph nodes positive with extranodal extension (1 additional lymph node had isolated tumor cells), right mastectomy benign; T3N1a ER 100%, PR 10%, HER-2 -1+, Ki-67 10 to 15%, stage Ib MammaPrint: Low risk, luminal type a Adjuvant radiation therapy: 08/14/2018- MRI back: L4 and L5 small lesions could not rule out metastatic disease. PET CT negative for bone metastases.  Treatment: Tamoxifen 20 mg dailyStarted 11/07/2018-stopped 01/01/2019 due to profounddepression/anxiety Couldn't tolerate Anastrozole  10/28/2018 PET CT scan: No evidence of metastatic disease, no hypermetabolism at L4-L5 vertebral bodies to correspond to the MRI findings.  CT abdomen 06/30/2019: No findings to suggest the cause of abdominal discomfort.  No metastatic disease Osteoporosis: T score -3.1  No role of mammogram since she had bilateral mastectomies Return to clinic in 1 year for follow-up

## 2020-08-03 NOTE — Progress Notes (Signed)
Patient Care Team: Lorrene Reid, PA-C as PCP - General Nicholas Lose, MD as Consulting Physician (Hematology and Oncology) Jovita Kussmaul, MD as Consulting Physician (General Surgery) Eppie Gibson, MD as Attending Physician (Radiation Oncology) Ursula Alert, MD as Consulting Physician (Psychiatry) Delice Bison, Charlestine Massed, NP as Nurse Practitioner (Hematology and Oncology) Meredith Pel, MD as Consulting Physician (Orthopedic Surgery)  DIAGNOSIS:    ICD-10-CM   1. Malignant neoplasm of overlapping sites of left breast in female, estrogen receptor positive ([place])  C50.812    Z17.0     SUMMARY OF ONCOLOGIC HISTORY: Oncology History  Malignant neoplasm of overlapping sites of left breast in female, estrogen receptor positive ([place])  07/2015 Miscellaneous   Genetics negative.  Genes tested: APC, ATM, BARD1, BMPR1A, BRCA1, BRCA2, BRIP1, CDH1, CDK4, CDKN2A, CHEK2, EPCAM, GREM1, MLH1, MSH2, MSH6, MUTYH, PALB2, PMS2, POLD1, POLE, PTEN, RAD51C, RAD51D, RET, SMAD4, STK11, and TP53.      05/06/2018 Initial Diagnosis   Screening detected left breast mass anteriorly 1030 to 11 o'clock position 2 masses 6 mm and 7 mm, 12:30 position 2.6 cm both of these masses biopsy-proven grade 2 invasive lobular cancer ER 100%, PR 10%, Ki-67 15%, HER-2 negative 1+ by IHC, lymph node positive for malignancy T2N1 stage IIa clinical stage   05/21/2018 Cancer Staging   Staging form: Breast, AJCC 8th Edition - Pathologic: Stage IB (pT3, pN1a, cM0, G2, ER+, PR+, HER2-) - Signed by Nicholas Lose, MD on 06/24/2018   06/13/2018 Surgery   Bilateral mastectomies: Left: ILC, grade 2, 5.4 cm, LCIS, margins negative, lymphovascular invasion present, 2/7 lymph nodes positive with extranodal extension (1 additional lymph node had isolated tumor cells), right mastectomy benign; T3N1a ER 100%, PR 10%, HER-2 -1+, Ki-67 10 to 15%, stage Ib   08/06/2018 - 09/25/2018 Radiation Therapy   Adjuvant XRT   10/09/2018 -   Anti-estrogen oral therapy   Anastrozole 24m daily, plan for 7 years; switched to tamoxifen 11/07/18 for bone health     CHIEF COMPLIANT: Follow-up of left breast cancer on anastrozole  INTERVAL HISTORY: Dawn COCHRANEis a 64y.o. with above-mentioned history of left breast cancer who underwent bilateral mastectomies and radiation, and is currently on anti-estrogen therapy withanastrozole as she could not tolerate tamoxifen. She presents to the clinic today for follow-up.   ALLERGIES:  is allergic to lamictal [lamotrigine], cymbalta [duloxetine hcl], hydrocodone, mobic [meloxicam], sulfa antibiotics, brintellix [vortioxetine], corticosteroids, morphine, other, prednisone, prozac [fluoxetine hcl], and zoloft [sertraline].  MEDICATIONS:  Current Outpatient Medications  Medication Sig Dispense Refill  . acetaminophen (TYLENOL) 500 MG tablet Take 1,000 mg by mouth at bedtime.     .Marland KitchenbuPROPion (WELLBUTRIN XL) 150 MG 24 hr tablet Take 1 tablet by mouth once daily 90 tablet 0  . diphenhydrAMINE (BENADRYL) 25 mg capsule Take 25 mg by mouth at bedtime.    . folic acid (FOLVITE) 4166MCG tablet Take 400 mcg by mouth daily.    . Magnesium 400 MG TABS Take 400 mg by mouth.    .Marland Kitchenomeprazole (PRILOSEC) 40 MG capsule TAKE 1 CAPSULE BY MOUTH DAILY 90 capsule 0  . OVER THE COUNTER MEDICATION 2 tablets daily. Vegan Calcium 500    . rosuvastatin (CRESTOR) 10 MG tablet TAKE 1 TABLET BY MOUTH AT BEDTIME 90 tablet 0  . temazepam (RESTORIL) 30 MG capsule Take 1 capsule by mouth at bedtime 90 capsule 0  . Vitamin D, Ergocalciferol, (DRISDOL) 1.25 MG (50000 UNIT) CAPS capsule Take 1 capsule (50,000 Units total) by  mouth every 7 (seven) days. 12 capsule 3   No current facility-administered medications for this visit.    PHYSICAL EXAMINATION: ECOG PERFORMANCE STATUS: 1 - Symptomatic but completely ambulatory  Vitals:   08/04/20 1012  BP: 131/82  Pulse: 88  Resp: 18  Temp: (!) 97.4 F (36.3 C)  SpO2: 98%    Filed Weights   08/04/20 1012  Weight: 152 lb (68.9 kg)    BREAST: No palpable masses or nodules in either right or left breasts. No palpable axillary supraclavicular or infraclavicular adenopathy no breast tenderness or nipple discharge. (exam performed in the presence of a chaperone)  LABORATORY DATA:  I have reviewed the data as listed CMP Latest Ref Rng & Units 02/19/2020 04/29/2019 07/04/2018  Glucose 65 - 99 mg/dL 116(H) 108(H) 92  BUN 8 - 27 mg/dL _0 Creatinine 0.57 - 1.00 mg/dL 0.81 0.82 0.76  Sodium 134 - 144 mmol/L 139 141 140  Potassium 3.5 - 5.2 mmol/L 4.2 4.0 3.5  Chloride 96 - 106 mmol/L 104 107 109  CO2 20 - 29 mmol/L _1 Calcium 8.7 - 10.3 mg/dL 8.9 8.6(L) 8.8(L)  Total Protein 6.0 - 8.5 g/dL 6.5 6.8 -  Total Bilirubin 0.0 - 1.2 mg/dL 0.4 0.5 -  Alkaline Phos 48 - 121 IU/L 68 47 -  AST 0 - 40 IU/L 14 19 -  ALT 0 - 32 IU/L 21 29 -    Lab Results  Component Value Date   WBC 4.5 02/19/2020   HGB 14.0 02/19/2020   HCT 41.8 02/19/2020   MCV 92 02/19/2020   PLT 184 02/19/2020   NEUTROABS 2.7 06/23/2019    ASSESSMENT & PLAN:  Malignant neoplasm of overlapping sites of left breast in female, estrogen receptor positive ([place]) 06/13/2018:Bilateral mastectomies: Left: ILC, grade 2, 5.4 cm, LCIS, margins negative, lymphovascular invasion present, 2/7 lymph nodes positive with extranodal extension (1 additional lymph node had isolated tumor cells), right mastectomy benign; T3N1a ER 100%, PR 10%, HER-2 -1+, Ki-67 10 to 15%, stage Ib MammaPrint: Low risk, luminal type a Adjuvant radiation therapy: 08/14/2018- MRI back: L4 and L5 small lesions could not rule out metastatic disease. PET CT negative for bone metastases.  Treatment: Tamoxifen 20 mg dailyStarted 11/07/2018-stopped 01/01/2019 due to profounddepression/anxiety Couldn't tolerate Anastrozole  10/28/2018 PET CT scan: No evidence of metastatic disease, no hypermetabolism at L4-L5 vertebral bodies to  correspond to the MRI findings.  CT abdomen 06/30/2019: No findings to suggest the cause of abdominal discomfort.  No metastatic disease Osteoporosis: T score -3.1  No role of mammogram since she had bilateral mastectomies Return to clinic in 1 year for follow-up    No orders of the defined types were placed in this encounter.  The patient has a good understanding of the overall plan. she agrees with it. she will call with any problems that may develop before the next visit here.  Total time spent: 20 mins including face to face time and time spent for planning, charting and coordination of care  Nicholas Lose, MD 08/04/2020  I, Cloyde Reams Dorshimer, am acting as scribe for Dr. Nicholas Lose.  I have reviewed the above documentation for accuracy and completeness, and I agree with the above.

## 2020-08-04 ENCOUNTER — Telehealth: Payer: Self-pay | Admitting: Hematology and Oncology

## 2020-08-04 ENCOUNTER — Other Ambulatory Visit: Payer: Self-pay

## 2020-08-04 ENCOUNTER — Inpatient Hospital Stay: Payer: 59 | Attending: Hematology and Oncology | Admitting: Hematology and Oncology

## 2020-08-04 DIAGNOSIS — M810 Age-related osteoporosis without current pathological fracture: Secondary | ICD-10-CM | POA: Diagnosis not present

## 2020-08-04 DIAGNOSIS — C50812 Malignant neoplasm of overlapping sites of left female breast: Secondary | ICD-10-CM

## 2020-08-04 DIAGNOSIS — Z170 Estrogen receptor positive status [ER+]: Secondary | ICD-10-CM | POA: Insufficient documentation

## 2020-08-04 DIAGNOSIS — Z923 Personal history of irradiation: Secondary | ICD-10-CM | POA: Insufficient documentation

## 2020-08-04 DIAGNOSIS — M899 Disorder of bone, unspecified: Secondary | ICD-10-CM | POA: Insufficient documentation

## 2020-08-04 DIAGNOSIS — Z9013 Acquired absence of bilateral breasts and nipples: Secondary | ICD-10-CM | POA: Diagnosis not present

## 2020-08-04 DIAGNOSIS — C773 Secondary and unspecified malignant neoplasm of axilla and upper limb lymph nodes: Secondary | ICD-10-CM | POA: Diagnosis not present

## 2020-08-04 NOTE — Telephone Encounter (Signed)
Scheduled appts per 12/2 los. Gave pt a print out of AVs.

## 2020-08-19 ENCOUNTER — Ambulatory Visit: Payer: 59 | Admitting: Physician Assistant

## 2020-08-25 ENCOUNTER — Encounter: Payer: Self-pay | Admitting: Physician Assistant

## 2020-08-25 ENCOUNTER — Ambulatory Visit (INDEPENDENT_AMBULATORY_CARE_PROVIDER_SITE_OTHER): Payer: 59 | Admitting: Physician Assistant

## 2020-08-25 ENCOUNTER — Other Ambulatory Visit: Payer: Self-pay

## 2020-08-25 VITALS — BP 138/88 | HR 86 | Ht 66.25 in | Wt 154.3 lb

## 2020-08-25 DIAGNOSIS — E785 Hyperlipidemia, unspecified: Secondary | ICD-10-CM

## 2020-08-25 DIAGNOSIS — F5105 Insomnia due to other mental disorder: Secondary | ICD-10-CM

## 2020-08-25 DIAGNOSIS — R141 Gas pain: Secondary | ICD-10-CM | POA: Insufficient documentation

## 2020-08-25 DIAGNOSIS — K449 Diaphragmatic hernia without obstruction or gangrene: Secondary | ICD-10-CM | POA: Insufficient documentation

## 2020-08-25 DIAGNOSIS — K594 Anal spasm: Secondary | ICD-10-CM | POA: Insufficient documentation

## 2020-08-25 DIAGNOSIS — F331 Major depressive disorder, recurrent, moderate: Secondary | ICD-10-CM

## 2020-08-25 DIAGNOSIS — Z1211 Encounter for screening for malignant neoplasm of colon: Secondary | ICD-10-CM | POA: Insufficient documentation

## 2020-08-25 DIAGNOSIS — C50812 Malignant neoplasm of overlapping sites of left female breast: Secondary | ICD-10-CM

## 2020-08-25 DIAGNOSIS — K828 Other specified diseases of gallbladder: Secondary | ICD-10-CM | POA: Insufficient documentation

## 2020-08-25 DIAGNOSIS — R1032 Left lower quadrant pain: Secondary | ICD-10-CM | POA: Insufficient documentation

## 2020-08-25 DIAGNOSIS — Z170 Estrogen receptor positive status [ER+]: Secondary | ICD-10-CM

## 2020-08-25 DIAGNOSIS — K5900 Constipation, unspecified: Secondary | ICD-10-CM | POA: Insufficient documentation

## 2020-08-25 DIAGNOSIS — R110 Nausea: Secondary | ICD-10-CM | POA: Insufficient documentation

## 2020-08-25 DIAGNOSIS — R1033 Periumbilical pain: Secondary | ICD-10-CM | POA: Insufficient documentation

## 2020-08-25 NOTE — Progress Notes (Signed)
Established Patient Office Visit  Subjective:  Patient ID: Dawn Booker, female    DOB: 03/26/1956  Age: 64 y.o. MRN: [id number]  CC:  Chief Complaint  Patient presents with  . Hyperlipidemia  . Depression    HPI Dawn Booker presents for follow up on hyperlipidemia and mood management. Has no acute concerns today.  HLD: Pt taking medication as directed without issues. Denies side effects including myalgias and RUQ pain. Has made some dietary changes by reducing red meat.  Mood: Reports mood is stable. Has no concerns.   Insomnia: States no changes in sleep. Continues to take temazepam to help with sleep.  Past Medical History:  Diagnosis Date  . ADD (attention deficit disorder)   . Anxiety   . Cancer [facility])    breast cancer  . Depression   . GERD (gastroesophageal reflux disease)   . Hiatal hernia   . History of radiation therapy 08/13/2018- 09/25/2018   Left Chest wall and IM nodes/ 50 Gy in 25 fractions, Left supraclaicular fossa and PAB/ 50 Gy in 25 fractions, Left chest wall scar boost/ 10 Gy in 5 fractions.   . Hyperlipemia, mixed   . Intermittent palpitations   . Malignant neoplasm of overlapping sites of left breast in female, estrogen receptor positive ([place]) 05/06/2018   oncologist-  dr Lindi Adie--  Invasive Lobular Cancer, CIS, Stage IB, Grade 2 (pT3,pN1a,cM0), ER+---  s/p  left mastectomy with sln dissections and right mastectomy (benign)  . PONV (postoperative nausea and vomiting)   . Right thyroid nodule   . Shingles 12/2017  . Tennis elbow     Past Surgical History:  Procedure Laterality Date  . AXILLARY LYMPH NODE DISSECTION Left 07/09/2018   Procedure: COMPLETION OF LEFT AXILLARY LYMPH NODE DISSECTION;  Surgeon: Jovita Kussmaul, MD;  Location: [facility];  Service: General;  Laterality: Left;  . BREAST EXCISIONAL BIOPSY Left 1995   benign  . CARDIOVASCULAR STRESS TEST  06/21/2017   normal nuclear stress study w/ no ischemia/  normal LV function and wall  function, nuclear stress ef 70%  . ELBOW SURGERY Left child  . MASTECTOMY W/ SENTINEL NODE BIOPSY Left 06/13/2018  . MASTECTOMY WITH RADIOACTIVE SEED GUIDED EXCISION AND AXILLARY SENTINEL LYMPH NODE BIOPSY Bilateral 06/13/2018   Procedure: LEFT MASTECTOMY WITH SENTINEL LYMPH NODE MAPPING AND TARGETED NODE DISSECTION AND RIGHT PROPHYLATIC MASTECTOMY;  Surgeon: Jovita Kussmaul, MD;  Location: [facility];  Service: General;  Laterality: Bilateral;  . THYROIDECTOMY Left 10-31-2001   dr gerkin @[facility]   . TRANSTHORACIC ECHOCARDIOGRAM  06/21/2017   ef 60-65%/  trivial MR (no evidence mvp)  . TUBAL LIGATION  yrs ago  . VAGINAL HYSTERECTOMY  1990s    Family History  Problem Relation Age of Onset  . Hypertension Mother   . Heart attack Mother   . Breast cancer Mother   . Bipolar disorder Father   . Breast cancer Maternal Grandmother   . Breast cancer Cousin     Social History   Socioeconomic History  . Marital status: Married    Spouse name: Not on file  . Number of children: 2  . Years of education: 15  . Highest education level: High school graduate  Occupational History  . Not on file  Tobacco Use  . Smoking status: Former Smoker    Packs/day: 0.25    Years: 0.00    Pack years: 0.00    Types: Cigarettes    Quit date: 12/05/2017    Years since  quitting: 2.7  . Smokeless tobacco: Never Used  Vaping Use  . Vaping Use: Never used  Substance and Sexual Activity  . Alcohol use: Not Currently  . Drug use: No  . Sexual activity: Yes    Partners: Male    Birth control/protection: None  Other Topics Concern  . Not on file  Social History Narrative   Lives at home with husband.   Right-handed.   1 cup coffee per day, occasional soda or tea.   Social Determinants of Health   Financial Resource Strain: Not on file  Food Insecurity: Not on file  Transportation Needs: Not on file  Physical Activity: Not on file  Stress: Not on file  Social Connections: Not on file  Intimate Partner  Violence: Not on file    Outpatient Medications Prior to Visit  Medication Sig Dispense Refill  . acetaminophen (TYLENOL) 500 MG tablet Take 1,000 mg by mouth at bedtime.    Marland Kitchen buPROPion (WELLBUTRIN XL) 150 MG 24 hr tablet Take 1 tablet by mouth once daily 90 tablet 0  . diphenhydrAMINE (BENADRYL) 25 mg capsule Take 25 mg by mouth at bedtime.    . folic acid (FOLVITE) 702 MCG tablet Take 400 mcg by mouth daily.    . Magnesium 400 MG TABS Take 400 mg by mouth.    Marland Kitchen omeprazole (PRILOSEC) 40 MG capsule TAKE 1 CAPSULE BY MOUTH DAILY 90 capsule 0  . OVER THE COUNTER MEDICATION 2 tablets daily. Vegan Calcium 500    . rosuvastatin (CRESTOR) 10 MG tablet TAKE 1 TABLET BY MOUTH AT BEDTIME 90 tablet 0  . temazepam (RESTORIL) 30 MG capsule Take 1 capsule by mouth at bedtime 90 capsule 0  . Vitamin D, Ergocalciferol, (DRISDOL) 1.25 MG (50000 UNIT) CAPS capsule Take 1 capsule (50,000 Units total) by mouth every 7 (seven) days. 12 capsule 3   No facility-administered medications prior to visit.    Allergies  Allergen Reactions  . Lamictal [Lamotrigine] Other (See Comments)    Katherina Right Syndrome  . Cymbalta [Duloxetine Hcl]     Katherina Right syndrome  . Hydrocodone Nausea Only  . Mobic [Meloxicam]     Same ingredients as lamictal  . Sulfa Antibiotics Hives  . Brintellix [Vortioxetine] Anxiety  . Corticosteroids Rash  . Morphine Palpitations and Other (See Comments)  . Other     "MYCINS" - severe abdominal cramping   . Prednisone Rash       . Prozac [Fluoxetine Hcl] Palpitations  . Zoloft [Sertraline] Anxiety    ROS Review of Systems A fourteen system review of systems was performed and found to be positive as per HPI.  Objective:    Physical Exam General:  Well Developed, well nourished, in no acute distress Neuro:  Alert and oriented,  extra-ocular muscles intact  HEENT:  Normocephalic, atraumatic, neck supple Skin:  no gross rash, warm, pink. Cardiac:  RRR, S1 S2  wnls Respiratory:  ECTA B/L, Not using accessory muscles, speaking in full sentences- unlabored. Vascular:  Ext warm, no cyanosis apprec.; no gross edema Psych:  No HI/SI, judgement and insight good, Euthymic mood. Full Affect.  BP 138/88   Pulse 86   Ht 5' 6.25" (1.683 m)   Wt 154 lb 4.8 oz (70 kg)   SpO2 99%   BMI 24.72 kg/m  Wt Readings from Last 3 Encounters:  08/25/20 154 lb 4.8 oz (70 kg)  08/04/20 152 lb (68.9 kg)  06/08/20 154 lb (69.9 kg)     Health  Maintenance Due  Topic Date Due  . Hepatitis C Screening  Never done  . HIV Screening  Never done  . COVID-19 Vaccine (3 - Moderna risk 4-dose series) 01/19/2020  . MAMMOGRAM  04/25/2020    There are no preventive care reminders to display for this patient.  Lab Results  Component Value Date   TSH 2.650 02/19/2020   Lab Results  Component Value Date   WBC 4.5 02/19/2020   HGB 14.0 02/19/2020   HCT 41.8 02/19/2020   MCV 92 02/19/2020   PLT 184 02/19/2020   Lab Results  Component Value Date   NA 139 02/19/2020   K 4.2 02/19/2020   CO2 23 02/19/2020   GLUCOSE 116 (H) 02/19/2020   BUN 13 02/19/2020   CREATININE 0.81 02/19/2020   BILITOT 0.4 02/19/2020   ALKPHOS 68 02/19/2020   AST 14 02/19/2020   ALT 21 02/19/2020   PROT 6.5 02/19/2020   ALBUMIN 4.5 02/19/2020   CALCIUM 8.9 02/19/2020   ANIONGAP 9 04/29/2019   Lab Results  Component Value Date   CHOL 137 02/19/2020   Lab Results  Component Value Date   HDL 57 02/19/2020   Lab Results  Component Value Date   LDLCALC 67 02/19/2020   Lab Results  Component Value Date   TRIG 65 02/19/2020   Lab Results  Component Value Date   CHOLHDL 2.4 02/19/2020   Lab Results  Component Value Date   HGBA1C 5.8 (H) 02/19/2020      Assessment & Plan:   Problem List Items Addressed This Visit      Other   Malignant neoplasm of overlapping sites of left breast in female, estrogen receptor positive ([place])   MDD (major depressive disorder), recurrent  episode, moderate ([place]) - Primary   Insomnia due to mental condition   Hyperlipidemia     Malignant neoplasm of overlapping sites of left breast in female, estrogen receptor positive: -Followed by oncology, Dr. Lindi Adie. -On anastrozole. -S/p bilateral mastectomies.  MDD: -PHQ-9 score of 5, denies SI/HI. -Continue current medication regimen. -Will continue to monitor.  Insomnia: -Stable. -Continue temazepam 30 mg.  PDMP reviewed, temazepam last filled July 01, 2020 for 90-day supply.  Hyperlipidemia: -Last lipid panel: Total cholesterol 137, triglycerides 65, HDL 57, LDL 67 -Continue Crestor 10 mg. -Continue with dietary changes and follow a heart healthy diet. -Will continue to monitor and plan to repeat lipid panel and hepatic function at follow-up visit.  No orders of the defined types were placed in this encounter.   Follow-up: Return in about 4 months (around 12/24/2020) for Mood, HLD and FBW (lipd panel, cmp) few days prior.   Note:  This note was prepared with assistance of Dragon voice recognition software. Occasional wrong-word or sound-a-like substitutions may have occurred due to the inherent limitations of voice recognition software.  Lorrene Reid, PA-C

## 2020-09-22 ENCOUNTER — Other Ambulatory Visit: Payer: Self-pay | Admitting: Physician Assistant

## 2020-09-22 DIAGNOSIS — F331 Major depressive disorder, recurrent, moderate: Secondary | ICD-10-CM

## 2020-09-22 DIAGNOSIS — E785 Hyperlipidemia, unspecified: Secondary | ICD-10-CM

## 2020-09-22 DIAGNOSIS — F5105 Insomnia due to other mental disorder: Secondary | ICD-10-CM

## 2020-09-22 DIAGNOSIS — F411 Generalized anxiety disorder: Secondary | ICD-10-CM

## 2020-09-26 ENCOUNTER — Other Ambulatory Visit: Payer: Self-pay | Admitting: Physician Assistant

## 2020-09-26 ENCOUNTER — Encounter: Payer: Self-pay | Admitting: Physician Assistant

## 2020-09-26 DIAGNOSIS — F411 Generalized anxiety disorder: Secondary | ICD-10-CM

## 2020-09-26 DIAGNOSIS — F5105 Insomnia due to other mental disorder: Secondary | ICD-10-CM

## 2020-09-26 DIAGNOSIS — F331 Major depressive disorder, recurrent, moderate: Secondary | ICD-10-CM

## 2020-09-27 ENCOUNTER — Telehealth: Payer: Self-pay | Admitting: Physician Assistant

## 2020-09-27 MED ORDER — TEMAZEPAM 30 MG PO CAPS: 30.0000 mg | ORAL_CAPSULE | Freq: Every day | ORAL | 0 refills | Status: DC

## 2020-09-27 NOTE — Telephone Encounter (Signed)
LVMTCB- will advise to pick up Rx in file folder. thank you

## 2020-09-27 NOTE — Telephone Encounter (Signed)
temazepam (RESTORIL) 30 MG capsule picked up on 09/27/20

## 2020-10-13 ENCOUNTER — Other Ambulatory Visit: Payer: 59

## 2020-10-18 ENCOUNTER — Encounter: Payer: Self-pay | Admitting: Physician Assistant

## 2020-10-18 MED ORDER — VITAMIN D (ERGOCALCIFEROL) 1.25 MG (50000 UNIT) PO CAPS: 50000.0000 [IU] | ORAL_CAPSULE | ORAL | 0 refills | Status: DC

## 2020-11-04 ENCOUNTER — Encounter: Payer: Self-pay | Admitting: Hematology and Oncology

## 2020-11-07 ENCOUNTER — Other Ambulatory Visit: Payer: Self-pay

## 2020-11-07 ENCOUNTER — Telehealth: Payer: Self-pay

## 2020-11-07 DIAGNOSIS — R519 Headache, unspecified: Secondary | ICD-10-CM

## 2020-11-07 DIAGNOSIS — C50812 Malignant neoplasm of overlapping sites of left female breast: Secondary | ICD-10-CM

## 2020-11-07 DIAGNOSIS — Z170 Estrogen receptor positive status [ER+]: Secondary | ICD-10-CM

## 2020-11-07 DIAGNOSIS — R413 Other amnesia: Secondary | ICD-10-CM

## 2020-11-07 NOTE — Telephone Encounter (Signed)
RN spoke with patient regarding MyChart message    Pt with history of breast cancer, and verbalized concern with recent changes in headache and memory loss.   Pt denies any visual changes, no slurred speech.   RN reviewed with MD - MD recommendations for MRI Brain W and WO Contrast.    Orders placed, awaiting authorization.  Pt aware.

## 2020-11-11 ENCOUNTER — Telehealth: Payer: Self-pay | Admitting: Hematology and Oncology

## 2020-11-11 NOTE — Telephone Encounter (Signed)
Scheduled follow-up appointment per 3/11 schedule message. Patient is aware.

## 2020-11-11 NOTE — Telephone Encounter (Signed)
Called pt to r/s appt. No answer. Left vm to call back to r/s.

## 2020-11-18 ENCOUNTER — Other Ambulatory Visit: Payer: Self-pay | Admitting: General Surgery

## 2020-11-18 ENCOUNTER — Ambulatory Visit (HOSPITAL_COMMUNITY): Payer: 59

## 2020-11-18 DIAGNOSIS — R2232 Localized swelling, mass and lump, left upper limb: Secondary | ICD-10-CM

## 2020-11-21 ENCOUNTER — Ambulatory Visit: Payer: 59 | Admitting: Hematology and Oncology

## 2020-12-05 ENCOUNTER — Other Ambulatory Visit: Payer: Self-pay

## 2020-12-05 ENCOUNTER — Ambulatory Visit (HOSPITAL_COMMUNITY)
Admission: RE | Admit: 2020-12-05 | Discharge: 2020-12-05 | Disposition: A | Discharge status: Home / Self Care | Payer: PPO | Source: Ambulatory Visit | Attending: Hematology and Oncology | Admitting: Hematology and Oncology

## 2020-12-05 DIAGNOSIS — R413 Other amnesia: Secondary | ICD-10-CM

## 2020-12-05 DIAGNOSIS — Z170 Estrogen receptor positive status [ER+]: Secondary | ICD-10-CM | POA: Diagnosis not present

## 2020-12-05 DIAGNOSIS — R519 Headache, unspecified: Secondary | ICD-10-CM | POA: Diagnosis not present

## 2020-12-05 DIAGNOSIS — C50812 Malignant neoplasm of overlapping sites of left female breast: Secondary | ICD-10-CM

## 2020-12-05 MED ORDER — GADOBUTROL 1 MMOL/ML IV SOLN
7.5000 mL | Freq: Once | INTRAVENOUS | Status: AC | PRN
  Administered 2020-12-05: 7.5 mL via INTRAVENOUS

## 2020-12-06 DIAGNOSIS — Z83511 Family history of glaucoma: Secondary | ICD-10-CM | POA: Diagnosis not present

## 2020-12-06 DIAGNOSIS — H2513 Age-related nuclear cataract, bilateral: Secondary | ICD-10-CM | POA: Diagnosis not present

## 2020-12-06 DIAGNOSIS — H40013 Open angle with borderline findings, low risk, bilateral: Secondary | ICD-10-CM | POA: Diagnosis not present

## 2020-12-06 DIAGNOSIS — H43812 Vitreous degeneration, left eye: Secondary | ICD-10-CM | POA: Diagnosis not present

## 2020-12-06 NOTE — Progress Notes (Signed)
Patient Care Team: Lorrene Reid, PA-C as PCP - General Nicholas Lose, MD as Consulting Physician (Hematology and Oncology) Jovita Kussmaul, MD as Consulting Physician (General Surgery) Eppie Gibson, MD as Attending Physician (Radiation Oncology) Ursula Alert, MD as Consulting Physician (Psychiatry) Delice Bison, Charlestine Massed, NP as Nurse Practitioner (Hematology and Oncology) Meredith Pel, MD as Consulting Physician (Orthopedic Surgery)  DIAGNOSIS:    ICD-10-CM   1. Malignant neoplasm of overlapping sites of left breast in female, estrogen receptor positive ([place])  C50.812    Z17.0     SUMMARY OF ONCOLOGIC HISTORY: Oncology History  Malignant neoplasm of overlapping sites of left breast in female, estrogen receptor positive ([place])  07/2015 Miscellaneous   Genetics negative.  Genes tested: APC, ATM, BARD1, BMPR1A, BRCA1, BRCA2, BRIP1, CDH1, CDK4, CDKN2A, CHEK2, EPCAM, GREM1, MLH1, MSH2, MSH6, MUTYH, PALB2, PMS2, POLD1, POLE, PTEN, RAD51C, RAD51D, RET, SMAD4, STK11, and TP53.      05/06/2018 Initial Diagnosis   Screening detected left breast mass anteriorly 1030 to 11 o'clock position 2 masses 6 mm and 7 mm, 12:30 position 2.6 cm both of these masses biopsy-proven grade 2 invasive lobular cancer ER 100%, PR 10%, Ki-67 15%, HER-2 negative 1+ by IHC, lymph node positive for malignancy T2N1 stage IIa clinical stage   05/21/2018 Cancer Staging   Staging form: Breast, AJCC 8th Edition - Pathologic: Stage IB (pT3, pN1a, cM0, G2, ER+, PR+, HER2-) - Signed by Nicholas Lose, MD on 06/24/2018   06/13/2018 Surgery   Bilateral mastectomies: Left: ILC, grade 2, 5.4 cm, LCIS, margins negative, lymphovascular invasion present, 2/7 lymph nodes positive with extranodal extension (1 additional lymph node had isolated tumor cells), right mastectomy benign; T3N1a ER 100%, PR 10%, HER-2 -1+, Ki-67 10 to 15%, stage Ib   08/06/2018 - 09/25/2018 Radiation Therapy   Adjuvant XRT   10/09/2018 -   Anti-estrogen oral therapy   Anastrozole 63m daily, plan for 7 years; switched to tamoxifen 11/07/18 for bone health     CHIEF COMPLIANT: Follow-up of left breast cancer on anastrozole  INTERVAL HISTORY: SGIANNY KILLMANis a 65y.o. with above-mentioned history of left breast cancer who underwent bilateral mastectomies and radiation, and is currently on anti-estrogen therapy withanastrozole as she could not toleratetamoxifen.Brain MRI on 12/05/20 showed no evidence of metastatic disease. She presentsto the clinictodayfor follow-up. We performed the MRI because she was having recurrent headaches as well as memory loss.  She is also scheduled for a bone density test on 12/19/2020.  She had an ultrasound of her left axilla today because Dr. TMarlou Starksfound small skin lesion.  Ultrasound was apparently negative.  ALLERGIES:  is allergic to lamictal [lamotrigine], cymbalta [duloxetine hcl], hydrocodone, mobic [meloxicam], sulfa antibiotics, brintellix [vortioxetine], corticosteroids, morphine, other, prednisone, prozac [fluoxetine hcl], and zoloft [sertraline].  MEDICATIONS:  Current Outpatient Medications  Medication Sig Dispense Refill  . acetaminophen (TYLENOL) 500 MG tablet Take 1,000 mg by mouth at bedtime.    .Marland KitchenbuPROPion (WELLBUTRIN XL) 150 MG 24 hr tablet Take 1 tablet by mouth once daily 90 tablet 0  . diphenhydrAMINE (BENADRYL) 25 mg capsule Take 25 mg by mouth at bedtime.    . folic acid (FOLVITE) 4062MCG tablet Take 400 mcg by mouth daily.    . Magnesium 400 MG TABS Take 400 mg by mouth.    .Marland Kitchenomeprazole (PRILOSEC) 40 MG capsule TAKE 1 CAPSULE BY MOUTH DAILY 90 capsule 0  . OVER THE COUNTER MEDICATION 2 tablets daily. Vegan Calcium 500    .  rosuvastatin (CRESTOR) 10 MG tablet TAKE 1 TABLET BY MOUTH AT BEDTIME 90 tablet 0  . temazepam (RESTORIL) 30 MG capsule Take 1 capsule (30 mg total) by mouth at bedtime. 90 capsule 0  . Vitamin D, Ergocalciferol, (DRISDOL) 1.25 MG (50000 UNIT) CAPS  capsule Take 1 capsule (50,000 Units total) by mouth every 7 (seven) days. 12 capsule 0   No current facility-administered medications for this visit.    PHYSICAL EXAMINATION: ECOG PERFORMANCE STATUS: 1 - Symptomatic but completely ambulatory  Vitals:   12/07/20 1144  BP: 132/88  Pulse: 87  Resp: 18  Temp: 97.7 F (36.5 C)  SpO2: 98%   Filed Weights   12/07/20 1144  Weight: 155 lb 9 oz (70.6 kg)      LABORATORY DATA:  I have reviewed the data as listed CMP Latest Ref Rng & Units 02/19/2020 04/29/2019 07/04/2018  Glucose 65 - 99 mg/dL 116(H) 108(H) 92  BUN 8 - 27 mg/dL '13 20 12  ' Creatinine 0.57 - 1.00 mg/dL 0.81 0.82 0.76  Sodium 134 - 144 mmol/L 139 141 140  Potassium 3.5 - 5.2 mmol/L 4.2 4.0 3.5  Chloride 96 - 106 mmol/L 104 107 109  CO2 20 - 29 mmol/L '23 25 26  ' Calcium 8.7 - 10.3 mg/dL 8.9 8.6(L) 8.8(L)  Total Protein 6.0 - 8.5 g/dL 6.5 6.8 -  Total Bilirubin 0.0 - 1.2 mg/dL 0.4 0.5 -  Alkaline Phos 48 - 121 IU/L 68 47 -  AST 0 - 40 IU/L 14 19 -  ALT 0 - 32 IU/L 21 29 -    Lab Results  Component Value Date   WBC 4.5 02/19/2020   HGB 14.0 02/19/2020   HCT 41.8 02/19/2020   MCV 92 02/19/2020   PLT 184 02/19/2020   NEUTROABS 2.7 06/23/2019    ASSESSMENT & PLAN:  Malignant neoplasm of overlapping sites of left breast in female, estrogen receptor positive ([place]) 06/13/2018:Bilateral mastectomies: Left: ILC, grade 2, 5.4 cm, LCIS, margins negative, lymphovascular invasion present, 2/7 lymph nodes positive with extranodal extension (1 additional lymph node had isolated tumor cells), right mastectomy benign; T3N1a ER 100%, PR 10%, HER-2 -1+, Ki-67 10 to 15%, stage Ib MammaPrint: Low risk, luminal type a Adjuvant radiation therapy: 08/14/2018- MRI back: L4 and L5 small lesions could not rule out metastatic disease. PET CT negative for bone metastases.  Treatment: Tamoxifen 20 mg dailyStarted 11/07/2018-stopped 01/01/2019 due to profounddepression/anxiety Couldn't  tolerate Anastrozole  10/28/2018 PET CT scan: No evidence of metastatic disease, no hypermetabolism at L4-L5 vertebral bodies to correspond to the MRI findings.  CT abdomen 06/30/2019: No findings to suggest the cause of abdominal discomfort. No metastatic disease Osteoporosis: T score -3.1  No role of mammogram since she had bilateral mastectomies  Memory loss: Brain MRI 12/06/2020: Negative for any metastatic disease.  Chronic microvascular changes. I discussed with her that there are other ways to improve her memory that include brain games, book reading, physical exercise that increases her heart rate. Unfortunately because of her hip pain she is not able to exercise regularly.  Bone density is set up for 12/19/2020.  If her bone density is continues to be greater than -3 then she will need Prolia. We will call her with results of the bone density test.  Patient will call us back if she does not hear from [place].  Return to clinic in December 2022 for follow-up    No orders of the defined types were placed in this encounter.  The patient  has a good understanding of the overall plan. she agrees with it. she will call with any problems that may develop before the next visit here.  Total time spent: 20 mins including face to face time and time spent for planning, charting and coordination of care  Rulon Eisenmenger, MD, MPH 12/07/2020  I, Cloyde Reams Dorshimer, am acting as scribe for Dr. Nicholas Lose.  I have reviewed the above documentation for accuracy and completeness, and I agree with the above.

## 2020-12-07 ENCOUNTER — Inpatient Hospital Stay: Payer: PPO | Attending: Hematology and Oncology | Admitting: Hematology and Oncology

## 2020-12-07 ENCOUNTER — Ambulatory Visit
Admission: RE | Admit: 2020-12-07 | Discharge: 2020-12-07 | Disposition: A | Discharge status: Home / Self Care | Payer: 59 | Source: Ambulatory Visit | Attending: General Surgery | Admitting: General Surgery

## 2020-12-07 ENCOUNTER — Other Ambulatory Visit: Payer: Self-pay

## 2020-12-07 DIAGNOSIS — N6489 Other specified disorders of breast: Secondary | ICD-10-CM | POA: Diagnosis not present

## 2020-12-07 DIAGNOSIS — Z9013 Acquired absence of bilateral breasts and nipples: Secondary | ICD-10-CM | POA: Insufficient documentation

## 2020-12-07 DIAGNOSIS — Z9011 Acquired absence of right breast and nipple: Secondary | ICD-10-CM | POA: Diagnosis not present

## 2020-12-07 DIAGNOSIS — R2232 Localized swelling, mass and lump, left upper limb: Secondary | ICD-10-CM

## 2020-12-07 DIAGNOSIS — Z170 Estrogen receptor positive status [ER+]: Secondary | ICD-10-CM | POA: Diagnosis not present

## 2020-12-07 DIAGNOSIS — C50912 Malignant neoplasm of unspecified site of left female breast: Secondary | ICD-10-CM | POA: Diagnosis not present

## 2020-12-07 DIAGNOSIS — Z853 Personal history of malignant neoplasm of breast: Secondary | ICD-10-CM | POA: Diagnosis not present

## 2020-12-07 DIAGNOSIS — C50812 Malignant neoplasm of overlapping sites of left female breast: Secondary | ICD-10-CM | POA: Diagnosis not present

## 2020-12-07 DIAGNOSIS — Z9012 Acquired absence of left breast and nipple: Secondary | ICD-10-CM | POA: Diagnosis not present

## 2020-12-07 DIAGNOSIS — Z79811 Long term (current) use of aromatase inhibitors: Secondary | ICD-10-CM | POA: Insufficient documentation

## 2020-12-07 NOTE — Assessment & Plan Note (Signed)
06/13/2018:Bilateral mastectomies: Left: ILC, grade 2, 5.4 cm, LCIS, margins negative, lymphovascular invasion present, 2/7 lymph nodes positive with extranodal extension (1 additional lymph node had isolated tumor cells), right mastectomy benign; T3N1a ER 100%, PR 10%, HER-2 -1+, Ki-67 10 to 15%, stage Ib MammaPrint: Low risk, luminal type a Adjuvant radiation therapy: 08/14/2018- MRI back: L4 and L5 small lesions could not rule out metastatic disease. PET CT negative for bone metastases.  Treatment: Tamoxifen 20 mg dailyStarted 11/07/2018-stopped 01/01/2019 due to profounddepression/anxiety Couldn't tolerate Anastrozole  10/28/2018 PET CT scan: No evidence of metastatic disease, no hypermetabolism at L4-L5 vertebral bodies to correspond to the MRI findings.  CT abdomen 06/30/2019: No findings to suggest the cause of abdominal discomfort. No metastatic disease Osteoporosis: T score -3.1  No role of mammogram since she had bilateral mastectomies Return to clinic in 1 year for follow-up

## 2020-12-14 ENCOUNTER — Other Ambulatory Visit: Payer: Self-pay | Admitting: Hematology and Oncology

## 2020-12-14 DIAGNOSIS — Z79811 Long term (current) use of aromatase inhibitors: Secondary | ICD-10-CM

## 2020-12-14 DIAGNOSIS — Z780 Asymptomatic menopausal state: Secondary | ICD-10-CM

## 2020-12-19 ENCOUNTER — Other Ambulatory Visit: Payer: Self-pay

## 2020-12-19 ENCOUNTER — Ambulatory Visit
Admission: RE | Admit: 2020-12-19 | Discharge: 2020-12-19 | Disposition: A | Discharge status: Home / Self Care | Payer: PPO | Source: Ambulatory Visit | Attending: Hematology and Oncology | Admitting: Hematology and Oncology

## 2020-12-19 DIAGNOSIS — Z780 Asymptomatic menopausal state: Secondary | ICD-10-CM

## 2020-12-19 DIAGNOSIS — Z79811 Long term (current) use of aromatase inhibitors: Secondary | ICD-10-CM

## 2020-12-19 DIAGNOSIS — M810 Age-related osteoporosis without current pathological fracture: Secondary | ICD-10-CM | POA: Diagnosis not present

## 2020-12-19 DIAGNOSIS — M85832 Other specified disorders of bone density and structure, left forearm: Secondary | ICD-10-CM | POA: Diagnosis not present

## 2020-12-21 ENCOUNTER — Other Ambulatory Visit: Payer: Self-pay | Admitting: Physician Assistant

## 2020-12-21 DIAGNOSIS — F331 Major depressive disorder, recurrent, moderate: Secondary | ICD-10-CM

## 2020-12-21 DIAGNOSIS — F411 Generalized anxiety disorder: Secondary | ICD-10-CM

## 2020-12-21 DIAGNOSIS — F5105 Insomnia due to other mental disorder: Secondary | ICD-10-CM

## 2020-12-23 ENCOUNTER — Other Ambulatory Visit: Payer: Self-pay

## 2020-12-23 ENCOUNTER — Other Ambulatory Visit: Payer: PPO

## 2020-12-23 DIAGNOSIS — Z0000 Encounter for general adult medical examination without abnormal findings: Secondary | ICD-10-CM

## 2020-12-23 DIAGNOSIS — E785 Hyperlipidemia, unspecified: Secondary | ICD-10-CM | POA: Diagnosis not present

## 2020-12-24 LAB — COMPREHENSIVE METABOLIC PANEL
ALT: 24 IU/L (ref 0–32)
AST: 16 IU/L (ref 0–40)
Albumin/Globulin Ratio: 2.2 (ref 1.2–2.2)
Albumin: 4.6 g/dL (ref 3.8–4.8)
Alkaline Phosphatase: 88 IU/L (ref 44–121)
BUN/Creatinine Ratio: 11 — ABNORMAL LOW (ref 12–28)
BUN: 11 mg/dL (ref 8–27)
Bilirubin Total: 0.6 mg/dL (ref 0.0–1.2)
CO2: 23 mmol/L (ref 20–29)
Calcium: 9.2 mg/dL (ref 8.7–10.3)
Chloride: 102 mmol/L (ref 96–106)
Creatinine, Ser: 0.97 mg/dL (ref 0.57–1.00)
Globulin, Total: 2.1 g/dL (ref 1.5–4.5)
Glucose: 104 mg/dL — ABNORMAL HIGH (ref 65–99)
Potassium: 4.3 mmol/L (ref 3.5–5.2)
Sodium: 140 mmol/L (ref 134–144)
Total Protein: 6.7 g/dL (ref 6.0–8.5)
eGFR: 65 mL/min/{1.73_m2} (ref >59)

## 2020-12-24 LAB — LIPID PANEL
Chol/HDL Ratio: 3.1 ratio (ref 0.0–4.4)
Cholesterol, Total: 165 mg/dL (ref 100–199)
HDL: 54 mg/dL (ref >39)
LDL Chol Calc (NIH): 93 mg/dL (ref 0–99)
Triglycerides: 100 mg/dL (ref 0–149)
VLDL Cholesterol Cal: 18 mg/dL (ref 5–40)

## 2020-12-26 DIAGNOSIS — L292 Pruritus vulvae: Secondary | ICD-10-CM | POA: Diagnosis not present

## 2020-12-26 DIAGNOSIS — B373 Candidiasis of vulva and vagina: Secondary | ICD-10-CM | POA: Diagnosis not present

## 2020-12-26 DIAGNOSIS — N898 Other specified noninflammatory disorders of vagina: Secondary | ICD-10-CM | POA: Diagnosis not present

## 2020-12-27 ENCOUNTER — Ambulatory Visit: Payer: 59 | Admitting: Physician Assistant

## 2020-12-28 ENCOUNTER — Other Ambulatory Visit: Payer: Self-pay | Admitting: Physician Assistant

## 2020-12-28 DIAGNOSIS — E785 Hyperlipidemia, unspecified: Secondary | ICD-10-CM

## 2020-12-29 ENCOUNTER — Telehealth: Payer: Self-pay | Admitting: Physician Assistant

## 2020-12-29 DIAGNOSIS — Z9013 Acquired absence of bilateral breasts and nipples: Secondary | ICD-10-CM

## 2020-12-29 MED ORDER — ROSUVASTATIN CALCIUM 10 MG PO TABS: 10.0000 mg | ORAL_TABLET | Freq: Every day | ORAL | 0 refills | Status: DC

## 2020-12-29 NOTE — Telephone Encounter (Signed)
Spoke with patient and apologized for mass generated letter being sent in regards to Mammogram. Pt had bilateral mastectomy in 2019. Problem list updated. Charted under oncology notes in surgery.

## 2021-01-09 ENCOUNTER — Other Ambulatory Visit: Payer: Self-pay | Admitting: Physician Assistant

## 2021-01-09 MED ORDER — VITAMIN D (ERGOCALCIFEROL) 1.25 MG (50000 UNIT) PO CAPS: 50000.0000 [IU] | ORAL_CAPSULE | ORAL | 0 refills | Status: DC

## 2021-01-09 MED ORDER — BUPROPION HCL ER (XL) 150 MG PO TB24: 1.0000 | ORAL_TABLET | Freq: Every day | ORAL | 0 refills | Status: DC

## 2021-01-10 ENCOUNTER — Encounter: Payer: Self-pay | Admitting: Physician Assistant

## 2021-01-10 ENCOUNTER — Ambulatory Visit (INDEPENDENT_AMBULATORY_CARE_PROVIDER_SITE_OTHER): Payer: PPO | Admitting: Physician Assistant

## 2021-01-10 ENCOUNTER — Other Ambulatory Visit: Payer: Self-pay

## 2021-01-10 VITALS — BP 130/87 | HR 80 | Temp 98.3°F | Ht 65.0 in | Wt 153.4 lb

## 2021-01-10 DIAGNOSIS — F331 Major depressive disorder, recurrent, moderate: Secondary | ICD-10-CM | POA: Diagnosis not present

## 2021-01-10 DIAGNOSIS — E559 Vitamin D deficiency, unspecified: Secondary | ICD-10-CM

## 2021-01-10 DIAGNOSIS — G4700 Insomnia, unspecified: Secondary | ICD-10-CM

## 2021-01-10 DIAGNOSIS — F411 Generalized anxiety disorder: Secondary | ICD-10-CM | POA: Diagnosis not present

## 2021-01-10 DIAGNOSIS — B379 Candidiasis, unspecified: Secondary | ICD-10-CM

## 2021-01-10 DIAGNOSIS — E785 Hyperlipidemia, unspecified: Secondary | ICD-10-CM

## 2021-01-10 MED ORDER — BUPROPION HCL ER (XL) 150 MG PO TB24: 1.0000 | ORAL_TABLET | Freq: Every day | ORAL | 0 refills | Status: DC

## 2021-01-10 NOTE — Patient Instructions (Signed)

## 2021-01-10 NOTE — Progress Notes (Signed)
Established Patient Office Visit  Subjective:  Patient ID: Dawn Booker, female    DOB: 02/28/1956  Age: 65 y.o. MRN: [id number]  CC:  Chief Complaint  Patient presents with  . Depression    HPI Dawn Booker presents for follow up on hyperlipidemia and mood management. Patient reports has been struggling with a consistent external yeast infection for about 5 weeks. Patient initially seen by her OB/GYN app provider (NP) and was given fluconazole which did not resolve symptoms, did another dose of fluconazole which was also ineffective and developed facial rash.  States she contacted her OB/GYN provider who advised to monitor symptoms and wait to see if they resolve.  Patient decided to look into other options and went to alternative medicine clinic and was advised to start probiotic.  Reports has been taking probiotic for digestive system and vaginal flora.  Does report consuming more sugar than normal lately.  Reports plans to follow-up with one of the physicians at her OB/GYN location.  HLD: Pt taking medication as directed without issues. Denies side effects including myalgias and RUQ pain.  Reports has not been as diligent with diet and staying active.  Mood: Reports medication compliance.  Denies mood changes, SI/HI.  Insomnia: Taking temazepam which helps get about 4 hours of "good sleep."  Patient reports has always struggled with staying asleep.  Has made changes by trying to reestablish her sleep schedule and go to sleep earlier and avoid daytime naps.  Past Medical History:  Diagnosis Date  . ADD (attention deficit disorder)   . Anxiety   . Cancer [facility])    breast cancer  . Depression   . GERD (gastroesophageal reflux disease)   . Hiatal hernia   . History of radiation therapy 08/13/2018- 09/25/2018   Left Chest wall and IM nodes/ 50 Gy in 25 fractions, Left supraclaicular fossa and PAB/ 50 Gy in 25 fractions, Left chest wall scar boost/ 10 Gy in 5 fractions.   .  Hyperlipemia, mixed   . Intermittent palpitations   . Malignant neoplasm of overlapping sites of left breast in female, estrogen receptor positive ([place]) 05/06/2018   oncologist-  dr Lindi Adie--  Invasive Lobular Cancer, CIS, Stage IB, Grade 2 (pT3,pN1a,cM0), ER+---  s/p  left mastectomy with sln dissections and right mastectomy (benign)  . PONV (postoperative nausea and vomiting)   . Right thyroid nodule   . Shingles 12/2017  . Tennis elbow     Past Surgical History:  Procedure Laterality Date  . AXILLARY LYMPH NODE DISSECTION Left 07/09/2018   Procedure: COMPLETION OF LEFT AXILLARY LYMPH NODE DISSECTION;  Surgeon: Jovita Kussmaul, MD;  Location: [facility];  Service: General;  Laterality: Left;  . BREAST EXCISIONAL BIOPSY Left 1995   benign  . CARDIOVASCULAR STRESS TEST  06/21/2017   normal nuclear stress study w/ no ischemia/  normal LV function and wall function, nuclear stress ef 70%  . ELBOW SURGERY Left child  . MASTECTOMY W/ SENTINEL NODE BIOPSY Left 06/13/2018  . MASTECTOMY WITH RADIOACTIVE SEED GUIDED EXCISION AND AXILLARY SENTINEL LYMPH NODE BIOPSY Bilateral 06/13/2018   Procedure: LEFT MASTECTOMY WITH SENTINEL LYMPH NODE MAPPING AND TARGETED NODE DISSECTION AND RIGHT PROPHYLATIC MASTECTOMY;  Surgeon: Jovita Kussmaul, MD;  Location: [facility];  Service: General;  Laterality: Bilateral;  . THYROIDECTOMY Left 10-31-2001   dr gerkin _0   . TRANSTHORACIC ECHOCARDIOGRAM  06/21/2017   ef 60-65%/  trivial MR (no evidence mvp)  . TUBAL LIGATION  yrs ago  .  VAGINAL HYSTERECTOMY  1990s    Family History  Problem Relation Age of Onset  . Hypertension Mother   . Heart attack Mother   . Breast cancer Mother   . Bipolar disorder Father   . Breast cancer Maternal Grandmother   . Breast cancer Cousin     Social History   Socioeconomic History  . Marital status: Married    Spouse name: Not on file  . Number of children: 2  . Years of education: 28  . Highest education level: High school  graduate  Occupational History  . Not on file  Tobacco Use  . Smoking status: Former Smoker    Packs/day: 0.25    Years: 0.00    Pack years: 0.00    Types: Cigarettes    Quit date: 12/05/2017    Years since quitting: 3.1  . Smokeless tobacco: Never Used  Vaping Use  . Vaping Use: Never used  Substance and Sexual Activity  . Alcohol use: Not Currently  . Drug use: No  . Sexual activity: Yes    Partners: Male    Birth control/protection: None  Other Topics Concern  . Not on file  Social History Narrative   Lives at home with husband.   Right-handed.   1 cup coffee per day, occasional soda or tea.   Social Determinants of Health   Financial Resource Strain: Not on file  Food Insecurity: Not on file  Transportation Needs: Not on file  Physical Activity: Not on file  Stress: Not on file  Social Connections: Not on file  Intimate Partner Violence: Not on file    Outpatient Medications Prior to Visit  Medication Sig Dispense Refill  . acetaminophen (TYLENOL) 500 MG tablet Take 1,000 mg by mouth at bedtime.    . diphenhydrAMINE (BENADRYL) 25 mg capsule Take 25 mg by mouth at bedtime.    . folic acid (FOLVITE) 431 MCG tablet Take 400 mcg by mouth daily.    . Magnesium 400 MG TABS Take 400 mg by mouth.    Marland Kitchen omeprazole (PRILOSEC) 40 MG capsule TAKE 1 CAPSULE BY MOUTH DAILY 90 capsule 0  . rosuvastatin (CRESTOR) 10 MG tablet Take 1 tablet (10 mg total) by mouth at bedtime. 90 tablet 0  . temazepam (RESTORIL) 30 MG capsule Take 1 capsule by mouth at bedtime 90 capsule 0  . Vitamin D, Ergocalciferol, (DRISDOL) 1.25 MG (50000 UNIT) CAPS capsule Take 1 capsule (50,000 Units total) by mouth every 7 (seven) days. 12 capsule 0  . buPROPion (WELLBUTRIN XL) 150 MG 24 hr tablet Take 1 tablet (150 mg total) by mouth daily. 90 tablet 0  . OVER THE COUNTER MEDICATION 2 tablets daily. Vegan Calcium 500     No facility-administered medications prior to visit.    Allergies  Allergen  Reactions  . Lamictal [Lamotrigine] Other (See Comments)    Katherina Right Syndrome  . Cymbalta [Duloxetine Hcl]     Katherina Right syndrome  . Hydrocodone Nausea Only  . Mobic [Meloxicam]     Same ingredients as lamictal  . Sulfa Antibiotics Hives  . Brintellix [Vortioxetine] Anxiety  . Corticosteroids Rash  . Morphine Palpitations and Other (See Comments)  . Other     "MYCINS" - severe abdominal cramping   . Prednisone Rash       . Prozac [Fluoxetine Hcl] Palpitations  . Zoloft [Sertraline] Anxiety    ROS Review of Systems A fourteen system review of systems was performed and found to be positive as  per HPI.    Objective:    Physical Exam General:  Well Developed, well nourished, in no acute distress  Neuro:  Alert and oriented,  extra-ocular muscles intact  HEENT:  Normocephalic, atraumatic, neck supple Skin:  no gross rash, warm, pink. Cardiac:  RRR, S1 S2 Respiratory:  ECTA B/L without wheezing, crackles or rales, Not using accessory muscles, speaking in full sentences- unlabored. Vascular:  Ext warm, no cyanosis apprec.; cap RF less 2 sec. Psych:  No HI/SI, judgement and insight good, Euthymic mood. Full Affect.   BP 130/87   Pulse 80   Temp 98.3 F (36.8 C)   Ht 5' 5" (1.651 m)   Wt 153 lb 6.4 oz (69.6 kg)   SpO2 99%   BMI 25.53 kg/m  Wt Readings from Last 3 Encounters:  01/10/21 153 lb 6.4 oz (69.6 kg)  12/07/20 155 lb 9 oz (70.6 kg)  08/25/20 154 lb 4.8 oz (70 kg)     Health Maintenance Due  Topic Date Due  . HIV Screening  Never done  . Hepatitis C Screening  Never done  . COVID-19 Vaccine (3 - Moderna risk 4-dose series) 01/19/2020    There are no preventive care reminders to display for this patient.  Lab Results  Component Value Date   TSH 2.650 02/19/2020   Lab Results  Component Value Date   WBC 4.5 02/19/2020   HGB 14.0 02/19/2020   HCT 41.8 02/19/2020   MCV 92 02/19/2020   PLT 184 02/19/2020   Lab Results  Component Value  Date   NA 140 12/23/2020   K 4.3 12/23/2020   CO2 23 12/23/2020   GLUCOSE 104 (H) 12/23/2020   BUN 11 12/23/2020   CREATININE 0.97 12/23/2020   BILITOT 0.6 12/23/2020   ALKPHOS 88 12/23/2020   AST 16 12/23/2020   ALT 24 12/23/2020   PROT 6.7 12/23/2020   ALBUMIN 4.6 12/23/2020   CALCIUM 9.2 12/23/2020   ANIONGAP 9 04/29/2019   EGFR 65 12/23/2020   Lab Results  Component Value Date   CHOL 165 12/23/2020   Lab Results  Component Value Date   HDL 54 12/23/2020   Lab Results  Component Value Date   LDLCALC 93 12/23/2020   Lab Results  Component Value Date   TRIG 100 12/23/2020   Lab Results  Component Value Date   CHOLHDL 3.1 12/23/2020   Lab Results  Component Value Date   HGBA1C 5.8 (H) 02/19/2020      Assessment & Plan:   Problem List Items Addressed This Visit      Other   MDD (major depressive disorder), recurrent episode, moderate (HCC)   Relevant Medications   buPROPion (WELLBUTRIN XL) 150 MG 24 hr tablet   GAD (generalized anxiety disorder)   Relevant Medications   buPROPion (WELLBUTRIN XL) 150 MG 24 hr tablet   Insomnia   Hyperlipidemia - Primary   Vitamin D deficiency   Relevant Orders   VITAMIN D 25 Hydroxy (Vit-D Deficiency, Fractures)    Other Visit Diagnoses    Yeast infection         MDD, recurrent episode, moderate, GAD: -PHQ-9 score of 6, stable. Patient has failed multiple medications (sertraline, fluoxetine, Lamictal, duloxetine, vortioxetine) due to side effects or ineffectiveness. -Continue current medication regimen. Provided refills. -Will continue to monitor.  Insomnia: -Stable. -Continue current medication regimen. -Encourage to continue with sleep changes to help re-establish circadian rhythm.   Hyperlipidemia: -Recent lipid panel wnl's, LDL 93 -Continue current medication  regimen. Recent hepatic function normal. -Recommend to resume heart healthy diet and increase physical activity such as walking. -Will continue to  monitor.  Vitamin D deficiency: -Last Vitamin D 39.2, will repeat Vit D today. -Pending results will adjust treatment plan or send additional refills.  Yeast infection: -Recommend to continue probiotic and reduce sugar intake. Recent fasting glucose mildly elevated at 104, last A1c 5.8. -Recommend to follow up with OB/GYN for further evaluation if symptoms fail to improve or worsen, or can schedule an acute office visit with [place].    Meds ordered this encounter  Medications  . buPROPion (WELLBUTRIN XL) 150 MG 24 hr tablet    Sig: Take 1 tablet (150 mg total) by mouth daily.    Dispense:  90 tablet    Refill:  0    Follow-up: Return in about 4 months (around 05/13/2021) for CPE and FBW.    Lorrene Reid, PA-C

## 2021-01-11 ENCOUNTER — Encounter: Payer: Self-pay | Admitting: Physician Assistant

## 2021-01-11 LAB — VITAMIN D 25 HYDROXY (VIT D DEFICIENCY, FRACTURES): Vit D, 25-Hydroxy: 78.8 ng/mL (ref 30.0–100.0)

## 2021-01-12 ENCOUNTER — Telehealth: Payer: Self-pay | Admitting: Physician Assistant

## 2021-01-12 NOTE — Telephone Encounter (Signed)
called patient per request to add to schedule- patient advised that her OBGYN contacted her to schedule with a Specialist for this issue. will not need this appointment. thank you

## 2021-01-18 DIAGNOSIS — N898 Other specified noninflammatory disorders of vagina: Secondary | ICD-10-CM | POA: Diagnosis not present

## 2021-01-18 DIAGNOSIS — R350 Frequency of micturition: Secondary | ICD-10-CM | POA: Diagnosis not present

## 2021-02-14 ENCOUNTER — Other Ambulatory Visit: Payer: Self-pay | Admitting: Physician Assistant

## 2021-02-14 MED ORDER — OMEPRAZOLE 40 MG PO CPDR: 1.0000 | DELAYED_RELEASE_CAPSULE | Freq: Every day | ORAL | 0 refills | Status: DC

## 2021-03-03 DIAGNOSIS — L710 Perioral dermatitis: Secondary | ICD-10-CM | POA: Diagnosis not present

## 2021-03-03 DIAGNOSIS — D181 Lymphangioma, any site: Secondary | ICD-10-CM | POA: Diagnosis not present

## 2021-03-03 DIAGNOSIS — L814 Other melanin hyperpigmentation: Secondary | ICD-10-CM | POA: Diagnosis not present

## 2021-03-03 DIAGNOSIS — L821 Other seborrheic keratosis: Secondary | ICD-10-CM | POA: Diagnosis not present

## 2021-03-03 DIAGNOSIS — D485 Neoplasm of uncertain behavior of skin: Secondary | ICD-10-CM | POA: Diagnosis not present

## 2021-03-03 DIAGNOSIS — D225 Melanocytic nevi of trunk: Secondary | ICD-10-CM | POA: Diagnosis not present

## 2021-03-03 DIAGNOSIS — D1801 Hemangioma of skin and subcutaneous tissue: Secondary | ICD-10-CM | POA: Diagnosis not present

## 2021-03-03 DIAGNOSIS — L578 Other skin changes due to chronic exposure to nonionizing radiation: Secondary | ICD-10-CM | POA: Diagnosis not present

## 2021-03-21 ENCOUNTER — Encounter: Payer: Self-pay | Admitting: Hematology and Oncology

## 2021-03-23 ENCOUNTER — Other Ambulatory Visit: Payer: Self-pay

## 2021-03-23 ENCOUNTER — Other Ambulatory Visit: Payer: Self-pay | Admitting: Physician Assistant

## 2021-03-23 DIAGNOSIS — C50812 Malignant neoplasm of overlapping sites of left female breast: Secondary | ICD-10-CM

## 2021-03-23 DIAGNOSIS — F331 Major depressive disorder, recurrent, moderate: Secondary | ICD-10-CM

## 2021-03-23 DIAGNOSIS — M898X9 Other specified disorders of bone, unspecified site: Secondary | ICD-10-CM

## 2021-03-23 DIAGNOSIS — F411 Generalized anxiety disorder: Secondary | ICD-10-CM

## 2021-03-23 DIAGNOSIS — F5105 Insomnia due to other mental disorder: Secondary | ICD-10-CM

## 2021-03-28 ENCOUNTER — Other Ambulatory Visit: Payer: Self-pay

## 2021-03-28 ENCOUNTER — Encounter (HOSPITAL_COMMUNITY)
Admission: RE | Admit: 2021-03-28 | Discharge: 2021-03-28 | Disposition: A | Discharge status: Home / Self Care | Payer: PPO | Source: Ambulatory Visit | Attending: Hematology and Oncology | Admitting: Hematology and Oncology

## 2021-03-28 DIAGNOSIS — M898X9 Other specified disorders of bone, unspecified site: Secondary | ICD-10-CM | POA: Insufficient documentation

## 2021-03-28 DIAGNOSIS — C50812 Malignant neoplasm of overlapping sites of left female breast: Secondary | ICD-10-CM | POA: Diagnosis not present

## 2021-03-28 DIAGNOSIS — Z170 Estrogen receptor positive status [ER+]: Secondary | ICD-10-CM | POA: Insufficient documentation

## 2021-03-28 DIAGNOSIS — Z853 Personal history of malignant neoplasm of breast: Secondary | ICD-10-CM | POA: Diagnosis not present

## 2021-03-28 DIAGNOSIS — R102 Pelvic and perineal pain: Secondary | ICD-10-CM | POA: Diagnosis not present

## 2021-03-28 MED ORDER — TECHNETIUM TC 99M MEDRONATE IV KIT
19.9000 | PACK | Freq: Once | INTRAVENOUS | Status: AC
  Administered 2021-03-28: 19.9 via INTRAVENOUS

## 2021-03-29 ENCOUNTER — Ambulatory Visit: Payer: PPO

## 2021-03-29 ENCOUNTER — Encounter: Payer: Self-pay | Admitting: Hematology and Oncology

## 2021-04-06 ENCOUNTER — Other Ambulatory Visit: Payer: Self-pay | Admitting: Physician Assistant

## 2021-04-06 DIAGNOSIS — E785 Hyperlipidemia, unspecified: Secondary | ICD-10-CM

## 2021-04-06 DIAGNOSIS — F411 Generalized anxiety disorder: Secondary | ICD-10-CM

## 2021-04-06 DIAGNOSIS — F331 Major depressive disorder, recurrent, moderate: Secondary | ICD-10-CM

## 2021-04-07 MED ORDER — ROSUVASTATIN CALCIUM 10 MG PO TABS: 10.0000 mg | ORAL_TABLET | Freq: Every day | ORAL | 0 refills | Status: DC

## 2021-04-07 MED ORDER — BUPROPION HCL ER (XL) 150 MG PO TB24: 150.0000 mg | ORAL_TABLET | Freq: Every day | ORAL | 0 refills | Status: DC

## 2021-05-02 DIAGNOSIS — Z170 Estrogen receptor positive status [ER+]: Secondary | ICD-10-CM | POA: Diagnosis not present

## 2021-05-02 DIAGNOSIS — C50912 Malignant neoplasm of unspecified site of left female breast: Secondary | ICD-10-CM | POA: Diagnosis not present

## 2021-05-09 ENCOUNTER — Other Ambulatory Visit: Payer: Self-pay

## 2021-05-09 DIAGNOSIS — Z130 Encounter for screening for diseases of the blood and blood-forming organs and certain disorders involving the immune mechanism: Secondary | ICD-10-CM

## 2021-05-09 DIAGNOSIS — Z1321 Encounter for screening for nutritional disorder: Secondary | ICD-10-CM

## 2021-05-12 ENCOUNTER — Other Ambulatory Visit: Payer: PPO

## 2021-05-12 ENCOUNTER — Other Ambulatory Visit: Payer: Self-pay

## 2021-05-12 DIAGNOSIS — Z130 Encounter for screening for diseases of the blood and blood-forming organs and certain disorders involving the immune mechanism: Secondary | ICD-10-CM

## 2021-05-12 DIAGNOSIS — Z1321 Encounter for screening for nutritional disorder: Secondary | ICD-10-CM | POA: Diagnosis not present

## 2021-05-12 DIAGNOSIS — Z13228 Encounter for screening for other metabolic disorders: Secondary | ICD-10-CM | POA: Diagnosis not present

## 2021-05-12 DIAGNOSIS — Z1329 Encounter for screening for other suspected endocrine disorder: Secondary | ICD-10-CM | POA: Diagnosis not present

## 2021-05-13 LAB — COMPREHENSIVE METABOLIC PANEL
ALT: 22 IU/L (ref 0–32)
AST: 20 IU/L (ref 0–40)
Albumin/Globulin Ratio: 2.5 — ABNORMAL HIGH (ref 1.2–2.2)
Albumin: 4.7 g/dL (ref 3.8–4.8)
Alkaline Phosphatase: 81 IU/L (ref 44–121)
BUN/Creatinine Ratio: 18 (ref 12–28)
BUN: 16 mg/dL (ref 8–27)
Bilirubin Total: 0.5 mg/dL (ref 0.0–1.2)
CO2: 24 mmol/L (ref 20–29)
Calcium: 9.3 mg/dL (ref 8.7–10.3)
Chloride: 103 mmol/L (ref 96–106)
Creatinine, Ser: 0.9 mg/dL (ref 0.57–1.00)
Globulin, Total: 1.9 g/dL (ref 1.5–4.5)
Glucose: 109 mg/dL — ABNORMAL HIGH (ref 65–99)
Potassium: 4.3 mmol/L (ref 3.5–5.2)
Sodium: 141 mmol/L (ref 134–144)
Total Protein: 6.6 g/dL (ref 6.0–8.5)
eGFR: 71 mL/min/{1.73_m2} (ref >59)

## 2021-05-13 LAB — LIPID PANEL
Chol/HDL Ratio: 2.9 ratio (ref 0.0–4.4)
Cholesterol, Total: 178 mg/dL (ref 100–199)
HDL: 61 mg/dL (ref >39)
LDL Chol Calc (NIH): 97 mg/dL (ref 0–99)
Triglycerides: 110 mg/dL (ref 0–149)
VLDL Cholesterol Cal: 20 mg/dL (ref 5–40)

## 2021-05-13 LAB — CBC WITH DIFFERENTIAL/PLATELET
Basophils Absolute: 0 10*3/uL (ref 0.0–0.2)
Basos: 0 %
EOS (ABSOLUTE): 0.1 10*3/uL (ref 0.0–0.4)
Eos: 1 %
Hematocrit: 44.2 % (ref 34.0–46.6)
Hemoglobin: 14.6 g/dL (ref 11.1–15.9)
Immature Grans (Abs): 0 10*3/uL (ref 0.0–0.1)
Immature Granulocytes: 0 %
Lymphocytes Absolute: 1.5 10*3/uL (ref 0.7–3.1)
Lymphs: 33 %
MCH: 31.3 pg (ref 26.6–33.0)
MCHC: 33 g/dL (ref 31.5–35.7)
MCV: 95 fL (ref 79–97)
Monocytes Absolute: 0.3 10*3/uL (ref 0.1–0.9)
Monocytes: 6 %
Neutrophils Absolute: 2.7 10*3/uL (ref 1.4–7.0)
Neutrophils: 60 %
Platelets: 185 10*3/uL (ref 150–450)
RBC: 4.66 x10E6/uL (ref 3.77–5.28)
RDW: 12.4 % (ref 11.7–15.4)
WBC: 4.6 10*3/uL (ref 3.4–10.8)

## 2021-05-13 LAB — HEMOGLOBIN A1C
Est. average glucose Bld gHb Est-mCnc: 123 mg/dL
Hgb A1c MFr Bld: 5.9 % — ABNORMAL HIGH (ref 4.8–5.6)

## 2021-05-13 LAB — TSH: TSH: 2.98 u[IU]/mL (ref 0.450–4.500)

## 2021-05-17 ENCOUNTER — Other Ambulatory Visit: Payer: Self-pay

## 2021-05-17 ENCOUNTER — Ambulatory Visit (INDEPENDENT_AMBULATORY_CARE_PROVIDER_SITE_OTHER): Payer: PPO | Admitting: Physician Assistant

## 2021-05-17 ENCOUNTER — Encounter: Payer: Self-pay | Admitting: Physician Assistant

## 2021-05-17 VITALS — BP 113/80 | HR 78 | Temp 98.4°F | Ht 65.0 in | Wt 150.0 lb

## 2021-05-17 DIAGNOSIS — Z23 Encounter for immunization: Secondary | ICD-10-CM | POA: Diagnosis not present

## 2021-05-17 DIAGNOSIS — Z0000 Encounter for general adult medical examination without abnormal findings: Secondary | ICD-10-CM

## 2021-05-17 DIAGNOSIS — M810 Age-related osteoporosis without current pathological fracture: Secondary | ICD-10-CM

## 2021-05-17 MED ORDER — DENOSUMAB 60 MG/ML ~~LOC~~ SOSY: 60.0000 mg | PREFILLED_SYRINGE | Freq: Once | SUBCUTANEOUS | 0 refills | Status: AC

## 2021-05-17 NOTE — Progress Notes (Signed)
Subjective:     Dawn Booker is a 65 y.o. female and is here for a comprehensive physical exam. The patient reports no problems.  Social History   Socioeconomic History   Marital status: Married    Spouse name: Not on file   Number of children: 2   Years of education: 12   Highest education level: High school graduate  Occupational History   Not on file  Tobacco Use   Smoking status: Former    Packs/day: 0.25    Years: 0.00    Pack years: 0.00    Types: Cigarettes    Quit date: 12/05/2017    Years since quitting: 3.4   Smokeless tobacco: Never  Vaping Use   Vaping Use: Never used  Substance and Sexual Activity   Alcohol use: Not Currently   Drug use: No   Sexual activity: Yes    Partners: Male    Birth control/protection: None  Other Topics Concern   Not on file  Social History Narrative   Lives at home with husband.   Right-handed.   1 cup coffee per day, occasional soda or tea.   Social Determinants of Health   Financial Resource Strain: Not on file  Food Insecurity: Not on file  Transportation Needs: Not on file  Physical Activity: Not on file  Stress: Not on file  Social Connections: Not on file  Intimate Partner Violence: Not on file   Health Maintenance  Topic Date Due   Pneumococcal Vaccine 75-69 Years old (1 - PCV) Never done   HIV Screening  Never done   Hepatitis C Screening  Never done   COVID-19 Vaccine (3 - Moderna risk series) 01/19/2020   TETANUS/TDAP  06/14/2024   COLONOSCOPY (Pts 45-33yr Insurance coverage will need to be confirmed)  07/23/2029   INFLUENZA VACCINE  Completed   Zoster Vaccines- Shingrix  Completed   HPV VACCINES  Aged Out   MAMMOGRAM  Discontinued   PAP SMEAR-Modifier  Discontinued    The following portions of the patient's history were reviewed and updated as appropriate: allergies, current medications, past family history, past medical history, past social history, past surgical history, and problem list.  Review of  Systems Pertinent items noted in HPI and remainder of comprehensive ROS otherwise negative.   Objective:    BP 113/80   Pulse 78   Temp 98.4 F (36.9 C)   Ht '5\' 5"'$  (1.651 m)   Wt 150 lb (68 kg)   SpO2 96%   BMI 24.96 kg/m  General appearance: alert, cooperative, and no distress Head: Normocephalic, without obvious abnormality, atraumatic Eyes: conjunctivae/corneas clear. PERRL, EOM's intact. Fundi benign. Ears: normal TM's and external ear canals both ears Nose: Nares normal. Septum midline. Mucosa normal. No drainage or sinus tenderness. Throat: lips, mucosa, and tongue normal; teeth and gums normal Neck: no adenopathy, no JVD, supple, symmetrical, trachea midline, and thyroid not enlarged, symmetric, no tenderness/mass/nodules Back: symmetric, no curvature. ROM normal. No CVA tenderness. Lungs: clear to auscultation bilaterally Heart: regular rate and rhythm, S1, S2 normal, no murmur, click, rub or gallop Abdomen: soft, non-tender; bowel sounds normal; no masses,  no organomegaly Extremities: extremities normal, atraumatic, no cyanosis or edema Pulses: 2+ and symmetric Skin: Skin color, texture, turgor normal. No rashes or lesions Lymph nodes: Cervical adenopathy: normal and Supraclavicular adenopathy: normal Neurologic: Grossly normal    Assessment:    Healthy female exam.     Plan:  -Discussed most recent labs which are essentially within  normal limits or stable from prior. Continue with walking regimen. Recommend to follow a heart healthy diet. -After shared-decision making, patient is agreeable to resume Prolia therapy. Sent new rx. Reviewed bone density 12/19/2020- T-score of -3.2. Discussed calcium and Vitamin D supplement. -Patient agreeable to influenza vaccine and will return for PCV vaccine in 2 weeks. Declined Hep C and HIV screenings. -Followed by OB/GYN and Oncology. -Continue current medication regimen. -Follow up in 4 months for prediabetes, hyperlipidemia,  mood and insomnia.  See After Visit Summary for Counseling Recommendations

## 2021-05-17 NOTE — Patient Instructions (Signed)
Preventive Care 40-64 Years Old, Female Preventive care refers to lifestyle choices and visits with your health care provider that can promote health and wellness. This includes: A yearly physical exam. This is also called an annual wellness visit. Regular dental and eye exams. Immunizations. Screening for certain conditions. Healthy lifestyle choices, such as: Eating a healthy diet. Getting regular exercise. Not using drugs or products that contain nicotine and tobacco. Limiting alcohol use. What can I expect for my preventive care visit? Physical exam Your health care provider will check your: Height and weight. These may be used to calculate your BMI (body mass index). BMI is a measurement that tells if you are at a healthy weight. Heart rate and blood pressure. Body temperature. Skin for abnormal spots. Counseling Your health care provider may ask you questions about your: Past medical problems. Family's medical history. Alcohol, tobacco, and drug use. Emotional well-being. Home life and relationship well-being. Sexual activity. Diet, exercise, and sleep habits. Work and work environment. Access to firearms. Method of birth control. Menstrual cycle. Pregnancy history. What immunizations do I need? Vaccines are usually given at various ages, according to a schedule. Your health care provider will recommend vaccines for you based on your age, medical history, and lifestyle or other factors, such as travel or where you work. What tests do I need? Blood tests Lipid and cholesterol levels. These may be checked every 5 years, or more often if you are over 65 years old. Hepatitis C test. Hepatitis B test. Screening Lung cancer screening. You may have this screening every year starting at age 65 if you have a 30-pack-year history of smoking and currently smoke or have quit within the past 15 years. Colorectal cancer screening. All adults should have this screening starting at  age 50 and continuing until age 75. Your health care provider may recommend screening at age 65 if you are at increased risk. You will have tests every 1-10 years, depending on your results and the type of screening test. Diabetes screening. This is done by checking your blood sugar (glucose) after you have not eaten for a while (fasting). You may have this done every 1-3 years. Mammogram. This may be done every 1-2 years. Talk with your health care provider about when you should start having regular mammograms. This may depend on whether you have a family history of breast cancer. BRCA-related cancer screening. This may be done if you have a family history of breast, ovarian, tubal, or peritoneal cancers. Pelvic exam and Pap test. This may be done every 3 years starting at age 21. Starting at age 30, this may be done every 5 years if you have a Pap test in combination with an HPV test. Other tests STD (sexually transmitted disease) testing, if you are at risk. Bone density scan. This is done to screen for osteoporosis. You may have this scan if you are at high risk for osteoporosis. Talk with your health care provider about your test results, treatment options, and if necessary, the need for more tests. Follow these instructions at home: Eating and drinking  Eat a diet that includes fresh fruits and vegetables, whole grains, lean protein, and low-fat dairy products. Take vitamin and mineral supplements as recommended by your health care provider. Do not drink alcohol if: Your health care provider tells you not to drink. You are pregnant, may be pregnant, or are planning to become pregnant. If you drink alcohol: Limit how much you have to 0-1 drink a day. Be   aware of how much alcohol is in your drink. In the [place] one drink equals one 12 oz bottle of beer (355 mL), one 5 oz glass of wine (148 mL), or one 1 oz glass of hard liquor (44 mL). Lifestyle Take daily care of your teeth and  gums. Brush your teeth every morning and night with fluoride toothpaste. Floss one time each day. Stay active. Exercise for at least 30 minutes 5 or more days each week. Do not use any products that contain nicotine or tobacco, such as cigarettes, e-cigarettes, and chewing tobacco. If you need help quitting, ask your health care provider. Do not use drugs. If you are sexually active, practice safe sex. Use a condom or other form of protection to prevent STIs (sexually transmitted infections). If you do not wish to become pregnant, use a form of birth control. If you plan to become pregnant, see your health care provider for a prepregnancy visit. If told by your health care provider, take low-dose aspirin daily starting at age 65. Find healthy ways to cope with stress, such as: Meditation, yoga, or listening to music. Journaling. Talking to a trusted person. Spending time with friends and family. Safety Always wear your seat belt while driving or riding in a vehicle. Do not drive: If you have been drinking alcohol. Do not ride with someone who has been drinking. When you are tired or distracted. While texting. Wear a helmet and other protective equipment during sports activities. If you have firearms in your house, make sure you follow all gun safety procedures. What's next? Visit your health care provider once a year for an annual wellness visit. Ask your health care provider how often you should have your eyes and teeth checked. Stay up to date on all vaccines. This information is not intended to replace advice given to you by your health care provider. Make sure you discuss any questions you have with your health care provider. Document Revised: 10/28/2020 Document Reviewed: 05/01/2018 Elsevier Patient Education  2022 [employer].

## 2021-05-21 ENCOUNTER — Encounter: Payer: Self-pay | Admitting: Physician Assistant

## 2021-05-26 ENCOUNTER — Encounter: Payer: Self-pay | Admitting: Hematology and Oncology

## 2021-05-30 ENCOUNTER — Other Ambulatory Visit: Payer: Self-pay

## 2021-05-30 ENCOUNTER — Ambulatory Visit (INDEPENDENT_AMBULATORY_CARE_PROVIDER_SITE_OTHER): Payer: PPO | Admitting: Physician Assistant

## 2021-05-30 VITALS — BP 120/78 | HR 78 | Temp 98.4°F | Ht 65.0 in | Wt 152.7 lb

## 2021-05-30 DIAGNOSIS — Z23 Encounter for immunization: Secondary | ICD-10-CM | POA: Diagnosis not present

## 2021-05-30 NOTE — Progress Notes (Signed)
HPI: Patient is here for a Prevnar 20 vaccine. She denies feeling unwell or having an adverse reaction to a previous vaccine.   Assessment and Plan: Patient tolerated vaccine well. [facility] will be updated accordingly.   Agree with above. MA, PA-C

## 2021-06-08 ENCOUNTER — Other Ambulatory Visit: Payer: Self-pay | Admitting: General Surgery

## 2021-06-08 DIAGNOSIS — R1032 Left lower quadrant pain: Secondary | ICD-10-CM

## 2021-06-18 ENCOUNTER — Encounter: Payer: Self-pay | Admitting: Physician Assistant

## 2021-06-18 DIAGNOSIS — F5105 Insomnia due to other mental disorder: Secondary | ICD-10-CM

## 2021-06-18 DIAGNOSIS — F331 Major depressive disorder, recurrent, moderate: Secondary | ICD-10-CM

## 2021-06-18 DIAGNOSIS — F411 Generalized anxiety disorder: Secondary | ICD-10-CM

## 2021-06-19 MED ORDER — TEMAZEPAM 30 MG PO CAPS: 30.0000 mg | ORAL_CAPSULE | Freq: Every day | ORAL | 0 refills | Status: DC

## 2021-06-19 NOTE — Telephone Encounter (Signed)
Patient last seen in office 05/17/21 and advised to follow up in January.   Last refill given 03/23/2021 #90

## 2021-06-21 ENCOUNTER — Other Ambulatory Visit: Payer: Self-pay

## 2021-06-21 ENCOUNTER — Ambulatory Visit
Admission: RE | Admit: 2021-06-21 | Discharge: 2021-06-21 | Disposition: A | Discharge status: Home / Self Care | Payer: PPO | Source: Ambulatory Visit | Attending: General Surgery | Admitting: General Surgery

## 2021-06-21 DIAGNOSIS — Z90710 Acquired absence of both cervix and uterus: Secondary | ICD-10-CM | POA: Diagnosis not present

## 2021-06-21 DIAGNOSIS — R1032 Left lower quadrant pain: Secondary | ICD-10-CM | POA: Diagnosis not present

## 2021-06-21 DIAGNOSIS — K7689 Other specified diseases of liver: Secondary | ICD-10-CM | POA: Diagnosis not present

## 2021-06-21 MED ORDER — IOPAMIDOL (ISOVUE-300) INJECTION 61%
100.0000 mL | Freq: Once | INTRAVENOUS | Status: AC | PRN
  Administered 2021-06-21: 100 mL via INTRAVENOUS

## 2021-07-04 DIAGNOSIS — C50912 Malignant neoplasm of unspecified site of left female breast: Secondary | ICD-10-CM | POA: Diagnosis not present

## 2021-07-04 DIAGNOSIS — Z9011 Acquired absence of right breast and nipple: Secondary | ICD-10-CM | POA: Diagnosis not present

## 2021-07-05 ENCOUNTER — Other Ambulatory Visit: Payer: Self-pay | Admitting: Physician Assistant

## 2021-07-05 DIAGNOSIS — E785 Hyperlipidemia, unspecified: Secondary | ICD-10-CM

## 2021-07-11 ENCOUNTER — Ambulatory Visit: Payer: PPO

## 2021-07-12 ENCOUNTER — Other Ambulatory Visit: Payer: Self-pay | Admitting: Physician Assistant

## 2021-07-20 ENCOUNTER — Other Ambulatory Visit: Payer: Self-pay

## 2021-07-20 ENCOUNTER — Ambulatory Visit (INDEPENDENT_AMBULATORY_CARE_PROVIDER_SITE_OTHER): Payer: PPO | Admitting: Physician Assistant

## 2021-07-20 VITALS — BP 157/86 | HR 86 | Temp 97.7°F | Ht 66.0 in | Wt 154.0 lb

## 2021-07-20 DIAGNOSIS — M810 Age-related osteoporosis without current pathological fracture: Secondary | ICD-10-CM | POA: Diagnosis not present

## 2021-07-20 MED ORDER — DENOSUMAB 60 MG/ML ~~LOC~~ SOSY
60.0000 mg | PREFILLED_SYRINGE | Freq: Once | SUBCUTANEOUS | Status: AC
  Administered 2021-07-20: 11:00:00 60 mg via SUBCUTANEOUS

## 2021-07-20 NOTE — Progress Notes (Signed)
HPI - Patient is here for a Prolia injection. Patient reports taking calcium and vitamin D daily. Patient's calcium levels and kidney function was within normal limits.   Assessment and Plan - Patient tolerated injection well without complications. Patient advised to schedule next injection in 6 month.   Noted. MA, PA-C

## 2021-07-25 DIAGNOSIS — C50912 Malignant neoplasm of unspecified site of left female breast: Secondary | ICD-10-CM | POA: Diagnosis not present

## 2021-07-25 DIAGNOSIS — Z9011 Acquired absence of right breast and nipple: Secondary | ICD-10-CM | POA: Diagnosis not present

## 2021-08-01 ENCOUNTER — Encounter: Payer: Self-pay | Admitting: Physician Assistant

## 2021-08-03 NOTE — Progress Notes (Incomplete)
Patient Care Team: Lorrene Reid, PA-C as PCP - General Nicholas Lose, MD as Consulting Physician (Hematology and Oncology) Jovita Kussmaul, MD as Consulting Physician (General Surgery) Eppie Gibson, MD as Attending Physician (Radiation Oncology) Ursula Alert, MD as Consulting Physician (Psychiatry) Delice Bison Charlestine Massed, NP as Nurse Practitioner (Hematology and Oncology) Marlou Sa Tonna Corner, MD as Consulting Physician (Orthopedic Surgery)  DIAGNOSIS: No diagnosis found.  SUMMARY OF ONCOLOGIC HISTORY: Oncology History  Malignant neoplasm of overlapping sites of left breast in female, estrogen receptor positive ([place])  07/2015 Miscellaneous   Genetics negative.  Genes tested: APC, ATM, BARD1, BMPR1A, BRCA1, BRCA2, BRIP1, CDH1, CDK4, CDKN2A, CHEK2, EPCAM, GREM1, MLH1, MSH2, MSH6, MUTYH, PALB2, PMS2, POLD1, POLE, PTEN, RAD51C, RAD51D, RET, SMAD4, STK11, and TP53.      05/06/2018 Initial Diagnosis   Screening detected left breast mass anteriorly 1030 to 11 o'clock position 2 masses 6 mm and 7 mm, 12:30 position 2.6 cm both of these masses biopsy-proven grade 2 invasive lobular cancer ER 100%, PR 10%, Ki-67 15%, HER-2 negative 1+ by IHC, lymph node positive for malignancy T2N1 stage IIa clinical stage   05/21/2018 Cancer Staging   Staging form: Breast, AJCC 8th Edition - Pathologic: Stage IB (pT3, pN1a, cM0, G2, ER+, PR+, HER2-) - Signed by Nicholas Lose, MD on 06/24/2018    06/13/2018 Surgery   Bilateral mastectomies: Left: ILC, grade 2, 5.4 cm, LCIS, margins negative, lymphovascular invasion present, 2/7 lymph nodes positive with extranodal extension (1 additional lymph node had isolated tumor cells), right mastectomy benign; T3N1a ER 100%, PR 10%, HER-2 -1+, Ki-67 10 to 15%, stage Ib   08/06/2018 - 09/25/2018 Radiation Therapy   Adjuvant XRT   10/09/2018 -  Anti-estrogen oral therapy   Anastrozole 68m daily, plan for 7 years; switched to tamoxifen 11/07/18 for bone health      CHIEF COMPLIANT: Follow-up of left breast cancer on anastrozole  INTERVAL HISTORY: Dawn FERDINANDis a 65y.o. with above-mentioned history of left breast cancer who underwent bilateral mastectomies and radiation, and is currently on anti-estrogen therapy with anastrozole as she could not tolerate tamoxifen. She presents to the clinic today for follow-up.   ALLERGIES:  is allergic to lamictal [lamotrigine], cymbalta [duloxetine hcl], hydrocodone, mobic [meloxicam], sulfa antibiotics, brintellix [vortioxetine], corticosteroids, morphine, other, prednisone, prozac [fluoxetine hcl], and zoloft [sertraline].  MEDICATIONS:  Current Outpatient Medications  Medication Sig Dispense Refill   acetaminophen (TYLENOL) 500 MG tablet Take 1,000 mg by mouth at bedtime.     buPROPion (WELLBUTRIN XL) 150 MG 24 hr tablet Take 1 tablet (150 mg total) by mouth daily. 90 tablet 0   diphenhydrAMINE (BENADRYL) 25 mg capsule Take 25 mg by mouth at bedtime.     folic acid (FOLVITE) 4767MCG tablet Take 400 mcg by mouth daily.     Magnesium 400 MG TABS Take 400 mg by mouth.     Multiple Vitamins-Minerals (VITAMIN D3 COMPLETE PO) Take 2,000 Units by mouth.     omeprazole (PRILOSEC) 40 MG capsule TAKE 1 CAPSULE BY MOUTH DAILY 90 capsule 0   OVER THE COUNTER MEDICATION 1 tablet daily. Vegan Calcium 1000     rosuvastatin (CRESTOR) 10 MG tablet TAKE 1 TABLET BY MOUTH DAILY AT BEDTIME 90 tablet 0   temazepam (RESTORIL) 30 MG capsule Take 1 capsule (30 mg total) by mouth at bedtime. 90 capsule 0   No current facility-administered medications for this visit.    PHYSICAL EXAMINATION: ECOG PERFORMANCE STATUS: {CHL ONC ECOG PHA:[id number]} There were  no vitals filed for this visit. There were no vitals filed for this visit.  BREAST:*** No palpable masses or nodules in either right or left breasts. No palpable axillary supraclavicular or infraclavicular adenopathy no breast tenderness or nipple discharge. (exam  performed in the presence of a chaperone)  LABORATORY DATA:  I have reviewed the data as listed CMP Latest Ref Rng & Units 05/12/2021 12/23/2020 02/19/2020  Glucose 65 - 99 mg/dL 109(H) 104(H) 116(H)  BUN 8 - 27 mg/dL '16 11 13  ' Creatinine 0.57 - 1.00 mg/dL 0.90 0.97 0.81  Sodium 134 - 144 mmol/L 141 140 139  Potassium 3.5 - 5.2 mmol/L 4.3 4.3 4.2  Chloride 96 - 106 mmol/L 103 102 104  CO2 20 - 29 mmol/L '24 23 23  ' Calcium 8.7 - 10.3 mg/dL 9.3 9.2 8.9  Total Protein 6.0 - 8.5 g/dL 6.6 6.7 6.5  Total Bilirubin 0.0 - 1.2 mg/dL 0.5 0.6 0.4  Alkaline Phos 44 - 121 IU/L 81 88 68  AST 0 - 40 IU/L '20 16 14  ' ALT 0 - 32 IU/L '22 24 21    ' Lab Results  Component Value Date   WBC 4.6 05/12/2021   HGB 14.6 05/12/2021   HCT 44.2 05/12/2021   MCV 95 05/12/2021   PLT 185 05/12/2021   NEUTROABS 2.7 05/12/2021    ASSESSMENT & PLAN:  No problem-specific Assessment & Plan notes found for this encounter.    No orders of the defined types were placed in this encounter.  The patient has a good understanding of the overall plan. she agrees with it. she will call with any problems that may develop before the next visit here.  Total time spent: *** mins including face to face time and time spent for planning, charting and coordination of care  Rulon Eisenmenger, MD, MPH 08/03/2021  I, Thana Ates, am acting as scribe for Dr. Nicholas Lose.  {insert scribe attestation}

## 2021-08-04 ENCOUNTER — Inpatient Hospital Stay: Payer: PPO | Admitting: Hematology and Oncology

## 2021-08-04 NOTE — Assessment & Plan Note (Deleted)
06/13/2018:Bilateral mastectomies: Left: ILC, grade 2, 5.4 cm, LCIS, margins negative, lymphovascular invasion present, 2/7 lymph nodes positive with extranodal extension (1 additional lymph node had isolated tumor cells), right mastectomy benign; T3N1a ER 100%, PR 10%, HER-2 -1+, Ki-67 10 to 15%, stage Ib MammaPrint: Low risk, luminal type a Adjuvant radiation therapy: 08/14/2018- MRI back: L4 and L5 small lesions could not rule out metastatic disease. PET CT negative for bone metastases.  Treatment: Tamoxifen 20 mg dailyStarted 11/07/2018-stopped 01/01/2019 due toprofounddepression/anxiety Couldn't tolerate Anastrozole  10/28/2018 PET CT scan: No evidence of metastatic disease, no hypermetabolism at L4-L5 vertebral bodies to correspond to the MRI findings.  CT abdomen and pelvis 06/22/2021: No acute abnormalities, fatty liver Bone scan 03/30/2021: No evidence of bone metastases  No role of mammogram since she had bilateral mastectomies  Memory loss: Brain MRI 12/06/2020: Negative for any metastatic disease.  Chronic microvascular changes.  Bone density 12/19/2020.  T score -3.2: I recommended that she receive Prolia along with calcium and vitamin D  Return to clinic in 6 months with Prolia and follow-up

## 2021-09-03 ENCOUNTER — Encounter: Payer: Self-pay | Admitting: Physician Assistant

## 2021-09-12 ENCOUNTER — Other Ambulatory Visit: Payer: Self-pay

## 2021-09-12 ENCOUNTER — Encounter: Payer: Self-pay | Admitting: Physician Assistant

## 2021-09-12 ENCOUNTER — Ambulatory Visit (INDEPENDENT_AMBULATORY_CARE_PROVIDER_SITE_OTHER): Payer: PPO | Admitting: Physician Assistant

## 2021-09-12 VITALS — BP 119/84 | HR 90 | Temp 98.0°F | Ht 66.0 in | Wt 157.0 lb

## 2021-09-12 DIAGNOSIS — E785 Hyperlipidemia, unspecified: Secondary | ICD-10-CM

## 2021-09-12 DIAGNOSIS — F411 Generalized anxiety disorder: Secondary | ICD-10-CM | POA: Diagnosis not present

## 2021-09-12 DIAGNOSIS — M810 Age-related osteoporosis without current pathological fracture: Secondary | ICD-10-CM | POA: Diagnosis not present

## 2021-09-12 DIAGNOSIS — F331 Major depressive disorder, recurrent, moderate: Secondary | ICD-10-CM

## 2021-09-12 DIAGNOSIS — F5105 Insomnia due to other mental disorder: Secondary | ICD-10-CM | POA: Diagnosis not present

## 2021-09-12 DIAGNOSIS — C50812 Malignant neoplasm of overlapping sites of left female breast: Secondary | ICD-10-CM

## 2021-09-12 DIAGNOSIS — Z170 Estrogen receptor positive status [ER+]: Secondary | ICD-10-CM

## 2021-09-12 DIAGNOSIS — R7303 Prediabetes: Secondary | ICD-10-CM | POA: Diagnosis not present

## 2021-09-12 MED ORDER — TEMAZEPAM 30 MG PO CAPS: 30.0000 mg | ORAL_CAPSULE | Freq: Every day | ORAL | 0 refills | Status: DC

## 2021-09-12 MED ORDER — BUPROPION HCL ER (SR) 100 MG PO TB12: 100.0000 mg | ORAL_TABLET | Freq: Two times a day (BID) | ORAL | 0 refills | Status: DC

## 2021-09-12 NOTE — Progress Notes (Signed)
Established Dawn Office Visit  Subjective:  Dawn Booker, female    DOB: September 27, 1955  Age: 66 y.o. MRN: [id number]  CC:  Chief Complaint  Dawn presents with   Follow-up    PreDM, HLD, Mood, Insomina     HPI Dawn Booker presents for follow up on hyperlipidemia, mood, insomnia and prediabetes. Dawn reports experienced jaw pain that one time but has also been having RLS and unsure if will continue with Prolia injection.   HLD: Pt taking medication as directed without issues. Denies side effects including myalgias or muscle weakness.  Mood: Dawn reports going through seasonal depression and has been eating more at night. Reports medication compliance. States always feels more depressed this time of the year. Dawn has tried and failed multiple mood medications including Trintellix.  Insomnia: Reports takes medication (Restoril) every night to help with sleep which has been stable.  Prediabetes: Dawn reports eating more junk food. No increased thirst or urination.  Past Medical History:  Diagnosis Date   ADD (attention deficit disorder)    Anxiety    Cancer ([place])    breast cancer   Depression    GERD (gastroesophageal reflux disease)    Hiatal hernia    History of radiation therapy 08/13/2018- 09/25/2018   Left Chest wall and IM nodes/ 50 Gy in 25 fractions, Left supraclaicular fossa and PAB/ 50 Gy in 25 fractions, Left chest wall scar boost/ 10 Gy in 5 fractions.    Hyperlipemia, mixed    Intermittent palpitations    Malignant neoplasm of overlapping sites of left breast in female, estrogen receptor positive ([place]) 05/06/2018   oncologist-  dr Lindi Adie--  Invasive Lobular Cancer, CIS, Stage IB, Grade 2 (pT3,pN1a,cM0), ER+---  s/p  left mastectomy with sln dissections and right mastectomy (benign)   PONV (postoperative nausea and vomiting)    Right thyroid nodule    Shingles 12/2017   Tennis elbow     Past Surgical History:  Procedure  Laterality Date   AXILLARY LYMPH NODE DISSECTION Left 07/09/2018   Procedure: COMPLETION OF LEFT AXILLARY LYMPH NODE DISSECTION;  Surgeon: [name], MD;  Location: [facility];  Service: General;  Laterality: Left;   BREAST EXCISIONAL BIOPSY Left 1995   benign   CARDIOVASCULAR STRESS TEST  06/21/2017   normal nuclear stress study w/ no ischemia/  normal LV function and wall function, nuclear stress ef 70%   ELBOW SURGERY Left child   MASTECTOMY W/ SENTINEL NODE BIOPSY Left 06/13/2018   MASTECTOMY WITH RADIOACTIVE SEED GUIDED EXCISION AND AXILLARY SENTINEL LYMPH NODE BIOPSY Bilateral 06/13/2018   Procedure: LEFT MASTECTOMY WITH SENTINEL LYMPH NODE MAPPING AND TARGETED NODE DISSECTION AND RIGHT PROPHYLATIC MASTECTOMY;  Surgeon: Dawn Kussmaul, MD;  Location: [facility];  Service: General;  Laterality: Bilateral;   THYROIDECTOMY Left 10-31-2001   dr gerkin _0    TRANSTHORACIC ECHOCARDIOGRAM  06/21/2017   ef 60-65%/  trivial MR (no evidence mvp)   TUBAL LIGATION  yrs ago   VAGINAL HYSTERECTOMY  1990s    Family History  Problem Relation Age of Onset   Hypertension Mother    Heart attack Mother    Breast cancer Mother    Bipolar disorder Father    Breast cancer Maternal Grandmother    Breast cancer Cousin     Social History   Socioeconomic History   Marital status: Married    Spouse name: Not on file   Number of children: 2   Years of education: 26  Highest education level: High school graduate  Occupational History   Not on file  Tobacco Use   Smoking status: Former    Packs/day: 0.25    Years: 0.00    Pack years: 0.00    Types: Cigarettes    Quit date: 12/05/2017    Years since quitting: 3.7   Smokeless tobacco: Never  Vaping Use   Vaping Use: Never used  Substance and Sexual Activity   Alcohol use: Not Currently   Drug use: No   Sexual activity: Yes    Partners: Male    Birth control/protection: None  Other Topics Concern   Not on file  Social History Narrative    Lives at home with husband.   Right-handed.   1 cup coffee per day, occasional soda or tea.   Social Determinants of Health   Financial Resource Strain: Not on file  Food Insecurity: Not on file  Transportation Needs: Not on file  Physical Activity: Not on file  Stress: Not on file  Social Connections: Not on file  Intimate Partner Violence: Not on file    Outpatient Medications Prior to Visit  Medication Sig Dispense Refill   acetaminophen (TYLENOL) 500 MG tablet Take 1,000 mg by mouth at bedtime.     Calcium Carbonate (CALCIUM 500 PO) Take 1,500 mg by mouth daily. Vegan     Coenzyme Q10 (COQ10) 100 MG CAPS Take 1 capsule by mouth daily.     diphenhydrAMINE (BENADRYL) 25 mg capsule Take 25 mg by mouth at bedtime.     folic acid (FOLVITE) 882 MCG tablet Take 400 mcg by mouth daily.     Magnesium 400 MG TABS Take 400 mg by mouth.     omeprazole (PRILOSEC) 40 MG capsule TAKE 1 CAPSULE BY MOUTH DAILY 90 capsule 0   OVER THE COUNTER MEDICATION 1 tablet daily. Vegan Calcium 1000     PROLIA 60 MG/ML SOSY injection Inject into the skin once.     rosuvastatin (CRESTOR) 10 MG tablet TAKE 1 TABLET BY MOUTH DAILY AT BEDTIME 90 tablet 0   buPROPion (WELLBUTRIN XL) 150 MG 24 hr tablet Take 1 tablet (150 mg total) by mouth daily. 90 tablet 0   Multiple Vitamins-Minerals (VITAMIN D3 COMPLETE PO) Take 2,000 Units by mouth.     temazepam (RESTORIL) 30 MG capsule Take 1 capsule (30 mg total) by mouth at bedtime. 90 capsule 0   No facility-administered medications prior to visit.    Allergies  Allergen Reactions   Lamictal [Lamotrigine] Other (See Comments)    Katherina Right Syndrome   Cymbalta [Duloxetine Hcl]     Katherina Right syndrome   Hydrocodone Nausea Only   Mobic [Meloxicam]     Same ingredients as lamictal   Sulfa Antibiotics Hives   Brintellix [Vortioxetine] Anxiety   Corticosteroids Rash   Morphine Palpitations and Other (See Comments)   Other     "MYCINS" - severe abdominal  cramping    Prednisone Rash        Prozac [Fluoxetine Hcl] Palpitations   Zoloft [Sertraline] Anxiety    ROS Review of Systems Review of Systems:  A fourteen system review of systems was performed and found to be positive as per HPI.   Objective:    Physical Exam General:  Well Developed, well nourished, appropriate for stated age.  Neuro:  Alert and oriented,  extra-ocular muscles intact  HEENT:  Normocephalic, atraumatic, neck supple  Skin:  no gross rash, warm, pink. Cardiac:  RRR, S1 S2  Respiratory: CTA B/L Vascular:  Ext warm, no cyanosis apprec.; cap RF less 2 sec. Psych:  No HI/SI, judgement and insight good, Euthymic mood. Full Affect.  BP 119/84    Pulse 90    Temp 98 F (36.7 C)    Ht _0  (1.676 m)    Wt 157 lb (71.2 kg)    SpO2 97%    BMI 25.34 kg/m  Wt Readings from Last 3 Encounters:  09/12/21 157 lb (71.2 kg)  07/20/21 154 lb (69.9 kg)  05/30/21 152 lb 11.2 oz (69.3 kg)     Health Maintenance Due  Topic Date Due   HIV Screening  Never done   Hepatitis C Screening  Never done   COVID-19 Vaccine (4 - Booster for Moderna series) 10/06/2020    There are no preventive care reminders to display for this Dawn.  Lab Results  Component Value Date   TSH 2.980 05/12/2021   Lab Results  Component Value Date   WBC 4.6 05/12/2021   HGB 14.6 05/12/2021   HCT 44.2 05/12/2021   MCV 95 05/12/2021   PLT 185 05/12/2021   Lab Results  Component Value Date   NA 141 05/12/2021   K 4.3 05/12/2021   CO2 24 05/12/2021   GLUCOSE 109 (H) 05/12/2021   BUN 16 05/12/2021   CREATININE 0.90 05/12/2021   BILITOT 0.5 05/12/2021   ALKPHOS 81 05/12/2021   AST 20 05/12/2021   ALT 22 05/12/2021   PROT 6.6 05/12/2021   ALBUMIN 4.7 05/12/2021   CALCIUM 9.3 05/12/2021   ANIONGAP 9 04/29/2019   EGFR 71 05/12/2021   Lab Results  Component Value Date   CHOL 178 05/12/2021   Lab Results  Component Value Date   HDL 61 05/12/2021   Lab Results  Component Value  Date   LDLCALC 97 05/12/2021   Lab Results  Component Value Date   TRIG 110 05/12/2021   Lab Results  Component Value Date   CHOLHDL 2.9 05/12/2021   Lab Results  Component Value Date   HGBA1C 5.9 (H) 05/12/2021   Depression screen PHQ 2/9 09/12/2021 05/17/2021 01/10/2021 08/25/2020 02/22/2020  Decreased Interest _1 Down, Depressed, Hopeless _2 PHQ - 2 Score _3 Altered sleeping 0 0 1 1 0  Tired, decreased energy 1 0 _4 Change in appetite 1 0 1 1 0  Feeling bad or failure about yourself  1 0 0 0 0  Trouble concentrating 0 0 1 0 0  Moving slowly or fidgety/restless 0 0 0 0 0  Suicidal thoughts 0 0 0 0 0  PHQ-9 Score _5 Difficult doing work/chores Somewhat difficult Somewhat difficult Somewhat difficult - Somewhat difficult  Some encounter information is confidential and restricted. Go to Review Flowsheets activity to see all data.  Some recent data might be hidden   GAD 7 : Generalized Anxiety Score 09/12/2021 05/17/2021  Nervous, Anxious, on Edge 1 1  Control/stop worrying 0 0  Worry too much - different things 0 1  Trouble relaxing 1 0  Restless 0 0  Easily annoyed or irritable 1 0  Afraid - awful might happen 0 0  Total GAD 7 Score 3 2  Anxiety Difficulty Somewhat difficult Somewhat difficult  Some encounter information is confidential and restricted. Go to Review Flowsheets activity to see all data.        Assessment &  Plan:   Problem List Items Addressed This Visit       Other   Malignant neoplasm of overlapping sites of left breast in female, estrogen receptor positive ([place])   MDD (major depressive disorder), recurrent episode, moderate ([place])    -Discussed with Dawn treatment adjustments to help improve mood and possibly also help reduce emotional eating, Dawn is agreeable to change from Wellbutrin XL 150 mg to Wellbutrin SR 100 mg BID. Reports in the past did not tolerate Wellbutrin XL 300 mg. Encourage to start exercise  routine such as yoga. Advised to let me know if unable to tolerate medication and will resume Wellbutrin XL 150 mg. Pt verbalized understanding. -Will continue to monitor.      Relevant Medications   buPROPion ER (WELLBUTRIN SR) 100 MG 12 hr tablet   temazepam (RESTORIL) 30 MG capsule   GAD (generalized anxiety disorder)    -Stable. -Wellbutrin XL 150 mg changed to Wellbutrin SR 100 mg BID. -Will continue to monitor.      Relevant Medications   buPROPion ER (WELLBUTRIN SR) 100 MG 12 hr tablet   temazepam (RESTORIL) 30 MG capsule   Insomnia due to mental condition    -Stable. -Continue current medication regimen. -Will continue to monitor.      Relevant Medications   temazepam (RESTORIL) 30 MG capsule   Hyperlipidemia - Primary    -Last lipid panel: Total cholesterol 178, triglycerides 110, HDL 64, LDL 97 -Continue current medication regimen.  Advised Dawn to schedule lab visit for fasting blood work to repeat lipid panel and hepatic function. -Will continue to monitor.       Other Visit Diagnoses     Prediabetes       Osteoporosis without current pathological fracture, unspecified osteoporosis type       Relevant Medications   PROLIA 60 MG/ML SOSY injection      Osteoporosis without current pathological fracture, unspecified osteoporosis type: -Discussed with Dawn risk versus benefit of medication therapy including Prolia.  Advised if continues to have significant side effects then recommend to discontinue.  Dawn verbalized understanding.  Discussed calcium and vitamin D supplement.  Prediabetes: -Last A1c 5.9, mildly increased from 5.8.  Will repeat A1c with fasting blood work. -Discussed reducing simple carbohydrate and glucose intake including late night snacks. -Will continue to monitor.   Meds ordered this encounter  Medications   buPROPion ER (WELLBUTRIN SR) 100 MG 12 hr tablet    Sig: Take 1 tablet (100 mg total) by mouth 2 (two) times daily.     Dispense:  60 tablet    Refill:  0    Order Specific Question:   Supervising Provider    Answer:   Beatrice Lecher D [[id number]]   temazepam (RESTORIL) 30 MG capsule    Sig: Take 1 capsule (30 mg total) by mouth at bedtime.    Dispense:  90 capsule    Refill:  0    Order Specific Question:   Supervising Provider    Answer:   Beatrice Lecher D [[id number]]    Follow-up: Return in about 4 months (around 01/10/2022) for HLD, Mood, PreDM, Insomnia; lab visit this week or next for FBW .    Lorrene Reid, PA-C

## 2021-09-12 NOTE — Assessment & Plan Note (Signed)
-  Stable. -Wellbutrin XL 150 mg changed to Wellbutrin SR 100 mg BID. -Will continue to monitor.

## 2021-09-12 NOTE — Assessment & Plan Note (Signed)
06/13/2018:Bilateral mastectomies: Left: ILC, grade 2, 5.4 cm, LCIS, margins negative, lymphovascular invasion present, 2/7 lymph nodes positive with extranodal extension (1 additional lymph node had isolated tumor cells), right mastectomy benign; T3N1a ER 100%, PR 10%, HER-2 -1+, Ki-67 10 to 15%, stage Ib MammaPrint: Low risk, luminal type a Adjuvant radiation therapy: 08/14/2018- MRI back: L4 and L5 small lesions could not rule out metastatic disease. PET CT negative for bone metastases.  Treatment: Tamoxifen 20 mg dailyStarted 11/07/2018-stopped 01/01/2019 due toprofounddepression/anxiety Couldn't tolerate Anastrozole  10/28/2018 PET CT scan: No evidence of metastatic disease, no hypermetabolism at L4-L5 vertebral bodies to correspond to the MRI findings.  CT abdomen 06/30/2019: No findings to suggest the cause of abdominal discomfort. No metastatic disease Osteoporosis: T score -3.1

## 2021-09-12 NOTE — Assessment & Plan Note (Signed)
-  Discussed with patient treatment adjustments to help improve mood and possibly also help reduce emotional eating, patient is agreeable to change from Wellbutrin XL 150 mg to Wellbutrin SR 100 mg BID. Reports in the past did not tolerate Wellbutrin XL 300 mg. Encourage to start exercise routine such as yoga. Advised to let me know if unable to tolerate medication and will resume Wellbutrin XL 150 mg. Pt verbalized understanding. -Will continue to monitor.

## 2021-09-12 NOTE — Assessment & Plan Note (Signed)
-  Stable. -Continue current medication regimen.  -Will continue to monitor. 

## 2021-09-12 NOTE — Assessment & Plan Note (Signed)
-  Last lipid panel: Total cholesterol 178, triglycerides 110, HDL 64, LDL 97 -Continue current medication regimen.  Advised patient to schedule lab visit for fasting blood work to repeat lipid panel and hepatic function. -Will continue to monitor.

## 2021-09-12 NOTE — Patient Instructions (Signed)
Eating Plan for Osteoporosis Osteoporosis causes your bones to become weak and brittle. This puts you at greater risk for bone breaks (fractures) from small bumps or falls. Making changes to your diet and increasing your physical activity can help strengthen your bones and improve your overall health. Calcium and vitamin D are nutrients that play an important role in bone health. Vitamin D helps your body use calcium and strengthen bones. It is important to get enough calcium and vitamin D as part of your eating plan for osteoporosis. What are tips for following this plan? Reading food labels Try to get at least 1,000 milligrams (mg) of calcium each day. Look for foods that have at least 50 mg of calcium per serving. Talk with your health care provider about taking a calcium supplement if you do not get enough calcium from food. Do not have more than 2,500 mg of calcium each day. This is the upper limit for food and nutritional supplements combined. Too much calcium may cause constipation and prevent you from absorbing other important nutrients. Choose foods that contain vitamin D. Take a daily vitamin supplement that contains 800-1,000 international units (IU) of vitamin D. The amount may be different depending on your age, body weight, and where you live. Talk with your dietitian or health care provider about how much vitamin D is right for you. Avoid foods that have more than 300 mg of sodium per serving. Too much sodium can cause your body to lose calcium. Talk with your dietitian or health care provider about how much sodium you are allowed each day. Shopping Do not buy foods with added salt, including: Salted snacks. Angie Fava. Canned soups. Canned meats. Processed meats, such as bacon or precooked or cured meat like sausages or meat loaves. Smoked fish. Meal planning Eat balanced meals that contain protein foods, fruits and vegetables, and foods rich in calcium and vitamin D. Eat at least  5 servings of fruits and vegetables each day. Eat 5-6 oz (142-170 g) of lean meat, poultry, fish, eggs, or beans each day. Lifestyle Do not use any products that contain nicotine or tobacco, such as cigarettes, e-cigarettes, and chewing tobacco. If you need help quitting, ask your health care provider. If your health care provider recommends that you lose weight: Work with a dietitian to develop an eating plan that will help you reach your desired weight goal. Exercise for at least 30 minutes a day, 5 or more days a week, or as told by your health care provider. Work with a physical therapist to develop an exercise plan that includes flexibility, balance, and strength exercises. Do not focus only on aerobic exercise. Do not drink alcohol if: Your health care provider tells you not to drink. You are pregnant, may be pregnant, or are planning to become pregnant. If you drink alcohol: Limit how much you use to: 0-1 drink a day for women. 0-2 drinks a day for men. Be aware of how much alcohol is in your drink. In the [place] one drink equals one 12 oz bottle of beer (355 mL), one 5 oz glass of wine (148 mL), or one 1 oz glass of hard liquor (44 mL). What foods should I eat? Foods high in calcium  Yogurt. Yogurt with fruit. Milk. Evaporated skim milk. Dry milk powder. Calcium-fortified orange juice. Parmesan cheese. Part-skim ricotta cheese. Natural hard cheese. Cream cheese. Cottage cheese. Canned sardines. Canned salmon. Calcium-treated tofu. Calcium-fortified cereal bar. Calcium-fortified cereal. Calcium-fortified graham crackers. Cooked collard greens. Turnip greens. Broccoli.  Kale. Almonds. White beans. Corn tortilla. Foods high in vitamin D Cod liver oil. Fatty fish, such as tuna, mackerel, and salmon. Milk. Fortified soy milk. Fortified fruit juice. Yogurt. Margarine. Egg yolks. Foods high in protein Beef. Lamb. Pork tenderloin. Chicken breast. Tuna (canned). Fish  fillet. Tofu. Cooked soy beans. Soy patty. Beans (canned or cooked). Cottage cheese. Yogurt. Peanut butter. Pumpkin seeds. Nuts. Sunflower seeds. Hard cheese. Milk or other milk products, such as soy milk. The items listed above may not be a complete list of foods and beverages you can eat. Contact a dietitian for more options. Summary Calcium and vitamin D are nutrients that play an important role in bone health and are an important part of your eating plan for osteoporosis. Eat balanced meals that contain protein foods, fruits and vegetables, and foods rich in calcium and vitamin D. Avoid foods that have more than 300 mg of sodium per serving. Too much sodium can cause your body to lose calcium. Exercise is an important part of prevention and treatment of osteoporosis. Aim for at least 30 minutes a day, 5 days a week. This information is not intended to replace advice given to you by your health care provider. Make sure you discuss any questions you have with your health care provider. Document Revised: 02/04/2020 Document Reviewed: 02/04/2020 Elsevier Patient Education  [employer].

## 2021-09-13 ENCOUNTER — Inpatient Hospital Stay: Payer: PPO | Attending: Hematology and Oncology | Admitting: Hematology and Oncology

## 2021-09-13 DIAGNOSIS — C50812 Malignant neoplasm of overlapping sites of left female breast: Secondary | ICD-10-CM | POA: Diagnosis not present

## 2021-09-13 DIAGNOSIS — Z9013 Acquired absence of bilateral breasts and nipples: Secondary | ICD-10-CM | POA: Diagnosis not present

## 2021-09-13 DIAGNOSIS — Z923 Personal history of irradiation: Secondary | ICD-10-CM | POA: Diagnosis not present

## 2021-09-13 DIAGNOSIS — Z170 Estrogen receptor positive status [ER+]: Secondary | ICD-10-CM | POA: Diagnosis not present

## 2021-09-13 NOTE — Progress Notes (Signed)
Patient Care Team: Lorrene Reid, PA-C as PCP - General Nicholas Lose, MD as Consulting Physician (Hematology and Oncology) Jovita Kussmaul, MD as Consulting Physician (General Surgery) Eppie Gibson, MD as Attending Physician (Radiation Oncology) Ursula Alert, MD as Consulting Physician (Psychiatry) Delice Bison Charlestine Massed, NP as Nurse Practitioner (Hematology and Oncology) Marlou Sa Tonna Corner, MD as Consulting Physician (Orthopedic Surgery)  DIAGNOSIS:  Encounter Diagnosis  Name Primary?   Malignant neoplasm of overlapping sites of left breast in female, estrogen receptor positive ([place])     SUMMARY OF ONCOLOGIC HISTORY: Oncology History  Malignant neoplasm of overlapping sites of left breast in female, estrogen receptor positive ([place])  07/2015 Miscellaneous   Genetics negative.  Genes tested: APC, ATM, BARD1, BMPR1A, BRCA1, BRCA2, BRIP1, CDH1, CDK4, CDKN2A, CHEK2, EPCAM, GREM1, MLH1, MSH2, MSH6, MUTYH, PALB2, PMS2, POLD1, POLE, PTEN, RAD51C, RAD51D, RET, SMAD4, STK11, and TP53.      05/06/2018 Initial Diagnosis   Screening detected left breast mass anteriorly 1030 to 11 o'clock position 2 masses 6 mm and 7 mm, 12:30 position 2.6 cm both of these masses biopsy-proven grade 2 invasive lobular cancer ER 100%, PR 10%, Ki-67 15%, HER-2 negative 1+ by IHC, lymph node positive for malignancy T2N1 stage IIa clinical stage   05/21/2018 Cancer Staging   Staging form: Breast, AJCC 8th Edition - Pathologic: Stage IB (pT3, pN1a, cM0, G2, ER+, PR+, HER2-) - Signed by Nicholas Lose, MD on 06/24/2018    06/13/2018 Surgery   Bilateral mastectomies: Left: ILC, grade 2, 5.4 cm, LCIS, margins negative, lymphovascular invasion present, 2/7 lymph nodes positive with extranodal extension (1 additional lymph node had isolated tumor cells), right mastectomy benign; T3N1a ER 100%, PR 10%, HER-2 -1+, Ki-67 10 to 15%, stage Ib   08/06/2018 - 09/25/2018 Radiation Therapy   Adjuvant XRT   10/09/2018 -   Anti-estrogen oral therapy   Anastrozole 57m daily, plan for 7 years; switched to tamoxifen 11/07/18 for bone health     CHIEF COMPLIANT: Surveillance of breast cancer  INTERVAL HISTORY: Dawn HLAVATYis a 66year old with 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  She is here for annual follow-up and reports no new problems or concerns.  Denies any pain or discomfort in the chest wall or axilla.   ALLERGIES:  is allergic to lamictal [lamotrigine], cymbalta [duloxetine hcl], hydrocodone, mobic [meloxicam], sulfa antibiotics, brintellix [vortioxetine], corticosteroids, morphine, other, prednisone, prozac [fluoxetine hcl], and zoloft [sertraline].  MEDICATIONS:  Current Outpatient Medications  Medication Sig Dispense Refill   acetaminophen (TYLENOL) 500 MG tablet Take 1,000 mg by mouth at bedtime.     buPROPion ER (WELLBUTRIN SR) 100 MG 12 hr tablet Take 1 tablet (100 mg total) by mouth 2 (two) times daily. 60 tablet 0   Calcium Carbonate (CALCIUM 500 PO) Take 1,500 mg by mouth daily. Vegan     Coenzyme Q10 (COQ10) 100 MG CAPS Take 1 capsule by mouth daily.     diphenhydrAMINE (BENADRYL) 25 mg capsule Take 25 mg by mouth at bedtime.     folic acid (FOLVITE) 4010MCG tablet Take 400 mcg by mouth daily.     Magnesium 400 MG TABS Take 400 mg by mouth.     omeprazole (PRILOSEC) 40 MG capsule TAKE 1 CAPSULE BY MOUTH DAILY 90 capsule 0   OVER THE COUNTER MEDICATION 1 tablet daily. Vegan Calcium 1000     PROLIA 60 MG/ML SOSY injection Inject into the skin once.  rosuvastatin (CRESTOR) 10 MG tablet TAKE 1 TABLET BY MOUTH DAILY AT BEDTIME 90 tablet 0   temazepam (RESTORIL) 30 MG capsule Take 1 capsule (30 mg total) by mouth at bedtime. 90 capsule 0   No current facility-administered medications for this visit.    PHYSICAL EXAMINATION: ECOG PERFORMANCE STATUS: 0 - Asymptomatic  Vitals:    09/13/21 1144  BP: 138/78  Pulse: 87  Resp: 17  Temp: (!) 97.5 F (36.4 C)  SpO2: 99%   Filed Weights   09/13/21 1144  Weight: 155 lb 14.4 oz (70.7 kg)    BREAST:  . (exam performed in the presence of a chaperone)  LABORATORY DATA:  I have reviewed the data as listed CMP Latest Ref Rng & Units 05/12/2021 12/23/2020 02/19/2020  Glucose 65 - 99 mg/dL 109(H) 104(H) 116(H)  BUN 8 - 27 mg/dL '16 11 13  ' Creatinine 0.57 - 1.00 mg/dL 0.90 0.97 0.81  Sodium 134 - 144 mmol/L 141 140 139  Potassium 3.5 - 5.2 mmol/L 4.3 4.3 4.2  Chloride 96 - 106 mmol/L 103 102 104  CO2 20 - 29 mmol/L '24 23 23  ' Calcium 8.7 - 10.3 mg/dL 9.3 9.2 8.9  Total Protein 6.0 - 8.5 g/dL 6.6 6.7 6.5  Total Bilirubin 0.0 - 1.2 mg/dL 0.5 0.6 0.4  Alkaline Phos 44 - 121 IU/L 81 88 68  AST 0 - 40 IU/L '20 16 14  ' ALT 0 - 32 IU/L '22 24 21    ' Lab Results  Component Value Date   WBC 4.6 05/12/2021   HGB 14.6 05/12/2021   HCT 44.2 05/12/2021   MCV 95 05/12/2021   PLT 185 05/12/2021   NEUTROABS 2.7 05/12/2021    ASSESSMENT & PLAN:  Malignant neoplasm of overlapping sites of left breast in female, estrogen receptor positive ([place]) 06/13/2018:Bilateral mastectomies: Left: ILC, grade 2, 5.4 cm, LCIS, margins negative, lymphovascular invasion present, 2/7 lymph nodes positive with extranodal extension (1 additional lymph node had isolated tumor cells), right mastectomy benign; T3N1a ER 100%, PR 10%, HER-2 -1+, Ki-67 10 to 15%, stage Ib MammaPrint: Low risk, luminal type a Adjuvant radiation therapy: 08/14/2018- MRI back: L4 and L5 small lesions could not rule out metastatic disease.  PET CT negative for bone metastases.   Treatment: Tamoxifen 20 mg daily Started 11/07/2018-stopped 01/01/2019 due to profound depression/anxiety Couldn't tolerate Anastrozole   10/28/2018 PET CT scan: No evidence of metastatic disease, no hypermetabolism at L4-L5 vertebral bodies to correspond to the MRI findings.   CT abdomen 06/30/2019: No  findings to suggest the cause of abdominal discomfort.  No metastatic disease Osteoporosis: T score -3.1  Breast cancer surveillance: No role of imaging since she had bilateral mastectomies Chest exam 09/13/2021: Benign Return to clinic in 1 year for follow-up and after that she could be seen on an as-needed basis  No orders of the defined types were placed in this encounter.  The patient has a good understanding of the overall plan. she agrees with it. she will call with any problems that may develop before the next visit here. Total time spent: 30 mins including face to face time and time spent for planning, charting and co-ordination of care   Harriette Ohara, MD 09/13/21

## 2021-09-14 ENCOUNTER — Encounter: Payer: Self-pay | Admitting: Physician Assistant

## 2021-09-14 DIAGNOSIS — Z853 Personal history of malignant neoplasm of breast: Secondary | ICD-10-CM | POA: Diagnosis not present

## 2021-09-14 DIAGNOSIS — Z01419 Encounter for gynecological examination (general) (routine) without abnormal findings: Secondary | ICD-10-CM | POA: Diagnosis not present

## 2021-09-14 DIAGNOSIS — Z9013 Acquired absence of bilateral breasts and nipples: Secondary | ICD-10-CM | POA: Diagnosis not present

## 2021-09-14 DIAGNOSIS — Z90710 Acquired absence of both cervix and uterus: Secondary | ICD-10-CM | POA: Diagnosis not present

## 2021-09-14 DIAGNOSIS — M810 Age-related osteoporosis without current pathological fracture: Secondary | ICD-10-CM | POA: Diagnosis not present

## 2021-09-18 ENCOUNTER — Other Ambulatory Visit: Payer: Self-pay

## 2021-09-18 DIAGNOSIS — R7303 Prediabetes: Secondary | ICD-10-CM

## 2021-09-18 DIAGNOSIS — E785 Hyperlipidemia, unspecified: Secondary | ICD-10-CM

## 2021-09-19 ENCOUNTER — Other Ambulatory Visit: Payer: PPO

## 2021-09-21 ENCOUNTER — Other Ambulatory Visit: Payer: PPO

## 2021-09-21 ENCOUNTER — Other Ambulatory Visit: Payer: Self-pay

## 2021-09-21 DIAGNOSIS — R7303 Prediabetes: Secondary | ICD-10-CM

## 2021-09-21 DIAGNOSIS — E785 Hyperlipidemia, unspecified: Secondary | ICD-10-CM

## 2021-09-22 LAB — LIPID PANEL
Chol/HDL Ratio: 3.2 ratio (ref 0.0–4.4)
Cholesterol, Total: 174 mg/dL (ref 100–199)
HDL: 54 mg/dL (ref >39)
LDL Chol Calc (NIH): 97 mg/dL (ref 0–99)
Triglycerides: 131 mg/dL (ref 0–149)
VLDL Cholesterol Cal: 23 mg/dL (ref 5–40)

## 2021-09-22 LAB — HEPATIC FUNCTION PANEL
ALT: 23 IU/L (ref 0–32)
AST: 18 IU/L (ref 0–40)
Albumin: 4.6 g/dL (ref 3.8–4.8)
Alkaline Phosphatase: 57 IU/L (ref 44–121)
Bilirubin Total: 0.5 mg/dL (ref 0.0–1.2)
Bilirubin, Direct: 0.16 mg/dL (ref 0.00–0.40)
Total Protein: 6.6 g/dL (ref 6.0–8.5)

## 2021-09-22 LAB — HEMOGLOBIN A1C
Est. average glucose Bld gHb Est-mCnc: 117 mg/dL
Hgb A1c MFr Bld: 5.7 % — ABNORMAL HIGH (ref 4.8–5.6)

## 2021-10-02 ENCOUNTER — Other Ambulatory Visit: Payer: Self-pay | Admitting: Physician Assistant

## 2021-10-02 DIAGNOSIS — E785 Hyperlipidemia, unspecified: Secondary | ICD-10-CM

## 2021-10-10 ENCOUNTER — Other Ambulatory Visit: Payer: Self-pay | Admitting: Physician Assistant

## 2021-10-11 ENCOUNTER — Other Ambulatory Visit: Payer: Self-pay

## 2021-10-11 ENCOUNTER — Encounter: Payer: Self-pay | Admitting: Physician Assistant

## 2021-10-11 ENCOUNTER — Ambulatory Visit (INDEPENDENT_AMBULATORY_CARE_PROVIDER_SITE_OTHER): Payer: PPO | Admitting: Physician Assistant

## 2021-10-11 VITALS — BP 118/72 | HR 74 | Temp 97.9°F | Ht 66.0 in | Wt 156.0 lb

## 2021-10-11 DIAGNOSIS — F331 Major depressive disorder, recurrent, moderate: Secondary | ICD-10-CM

## 2021-10-11 DIAGNOSIS — F411 Generalized anxiety disorder: Secondary | ICD-10-CM | POA: Diagnosis not present

## 2021-10-11 DIAGNOSIS — M545 Low back pain, unspecified: Secondary | ICD-10-CM | POA: Diagnosis not present

## 2021-10-11 MED ORDER — METHOCARBAMOL 500 MG PO TABS: 500.0000 mg | ORAL_TABLET | Freq: Three times a day (TID) | ORAL | 0 refills | Status: DC | PRN

## 2021-10-11 MED ORDER — BUPROPION HCL ER (XL) 150 MG PO TB24: 150.0000 mg | ORAL_TABLET | Freq: Every day | ORAL | 0 refills | Status: DC

## 2021-10-11 NOTE — Progress Notes (Signed)
Established patient acute visit   Patient: Dawn Booker   DOB: November 17, 1955   66 y.o. Female  MRN: [id number] Visit Date: 10/11/2021  Chief Complaint  Patient presents with   Acute Visit   Subjective    HPI  Patient presents for c/o low back pain x 5 days. Reports before pain onset was taking the headboard off but when pulling out an item felt a twinge in her low back and went about her day including vacuuming but later that night had back spasms. Pain is left sided and radiated down lower leg. Has applied heat and ice which did not provide significant relief. Also took some Tylenol which provided some relief. States pain comes and goes and is some better today. No fever, paresthesia, bladder or bowel dysfunction. Has been careful with bending and over-reaching. Also reports Wellbutrin SR 100 mg BID was keeping her awake and wants to try Wellbutrin XL 150 mg once daily. States her oncologist recommended to take Prolia injection annually instead of every 6 months to minimize side effects (joint pain).    Medications: Outpatient Medications Prior to Visit  Medication Sig   acetaminophen (TYLENOL) 500 MG tablet Take 1,000 mg by mouth at bedtime.   Calcium Carbonate (CALCIUM 500 PO) Take 1,500 mg by mouth daily. Vegan   Coenzyme Q10 (COQ10) 100 MG CAPS Take 1 capsule by mouth daily.   diphenhydrAMINE (BENADRYL) 25 mg capsule Take 25 mg by mouth at bedtime.   folic acid (FOLVITE) 867 MCG tablet Take 400 mcg by mouth daily.   Magnesium 400 MG TABS Take 400 mg by mouth.   omeprazole (PRILOSEC) 40 MG capsule TAKE 1 CAPSULE BY MOUTH DAILY   OVER THE COUNTER MEDICATION 1 tablet daily. Vegan Calcium 1000   PROLIA 60 MG/ML SOSY injection Inject into the skin once.   rosuvastatin (CRESTOR) 10 MG tablet TAKE 1 TABLET BY MOUTH DAILY AT BEDTIME   temazepam (RESTORIL) 30 MG capsule Take 1 capsule (30 mg total) by mouth at bedtime.   [DISCONTINUED] buPROPion ER (WELLBUTRIN SR) 100 MG 12 hr tablet Take  1 tablet (100 mg total) by mouth 2 (two) times daily.   No facility-administered medications prior to visit.    Review of Systems Review of Systems:  A fourteen system review of systems was performed and found to be positive as per HPI.  Depression screen [facility] 2/9 10/11/2021 09/12/2021 05/17/2021 01/10/2021 08/25/2020  Decreased Interest 1 1 1 1 1   Down, Depressed, Hopeless 1 1 1 1 1   PHQ - 2 Score 2 2 2 2 2   Altered sleeping 1 0 0 1 1  Tired, decreased energy 1 1 0 1 1  Change in appetite 0 1 0 1 1  Feeling bad or failure about yourself  0 1 0 0 0  Trouble concentrating 0 0 0 1 0  Moving slowly or fidgety/restless 0 0 0 0 0  Suicidal thoughts 0 0 0 0 0  PHQ-9 Score 4 5 2 6 5   Difficult doing work/chores Somewhat difficult Somewhat difficult Somewhat difficult Somewhat difficult -  Some encounter information is confidential and restricted. Go to Review Flowsheets activity to see all data.  Some recent data might be hidden   GAD 7 : Generalized Anxiety Score 09/12/2021 05/17/2021  Nervous, Anxious, on Edge 1 1  Control/stop worrying 0 0  Worry too much - different things 0 1  Trouble relaxing 1 0  Restless 0 0  Easily annoyed or irritable 1 0  Afraid -  awful might happen 0 0  Total GAD 7 Score 3 2  Anxiety Difficulty Somewhat difficult Somewhat difficult  Some encounter information is confidential and restricted. Go to Review Flowsheets activity to see all data.         Objective    BP 118/72    Pulse 74    Temp 97.9 F (36.6 C)    Ht 5\' 6"  (1.676 m)    Wt 156 lb (70.8 kg)    SpO2 98%    BMI 25.18 kg/m    Physical Exam Constitutional:      General: She is not in acute distress.    Appearance: She is not toxic-appearing.  HENT:     Head: Normocephalic and atraumatic.  Pulmonary:     Effort: Pulmonary effort is normal.  Musculoskeletal:        General: No swelling.     Cervical back: Normal range of motion and neck supple. No deformity or tenderness.     Thoracic back:  No deformity or tenderness.     Lumbar back: Tenderness present. No deformity. Normal range of motion.     Right lower leg: No edema.     Left lower leg: No edema.     Comments: Left sided lumbar tenderness. No tenderness at greater trochanter region b/l.  Skin:    General: Skin is warm and dry.  Neurological:     General: No focal deficit present.     Mental Status: She is alert.  Psychiatric:        Mood and Affect: Mood normal.        Behavior: Behavior normal.       No results found for any visits on 10/11/21.  Assessment & Plan     Discussed with patient s/sx suggestive of MSK etiology (lumbar sprain). Recommend conservative treatment with ice/heat therapy and gentle stretches/exercises. Offered a low dose corticosteroid injection, pt declined. Prefers to take a muscle relaxer as needed. Continue Tylenol as needed for pain relief.   MDD, GAD: -Will change Wellbutrin SR to XL 150 mg daily. Recommend to keep follow up visit as scheduled.    Return if symptoms worsen or fail to improve.        Lorrene Reid, PA-C  [facility] at [facility] [phone] (phone) [phone] (fax)  [place]

## 2021-10-11 NOTE — Patient Instructions (Addendum)
Back Exercises The following exercises strengthen the muscles that help to support the trunk (torso) and back. They also help to keep the lower back flexible. Doing these exercises can help to prevent or lessen existing low back pain. If you have back pain or discomfort, try doing these exercises 2-3 times each day or as told by your health care provider. As your pain improves, do them once each day, but increase the number of times that you repeat the steps for each exercise (do more repetitions). To prevent the recurrence of back pain, continue to do these exercises once each day or as told by your health care provider. Do exercises exactly as told by your health care provider and adjust them as directed. It is normal to feel mild stretching, pulling, tightness, or discomfort as you do these exercises, but you should stop right away if you feel sudden pain or your pain gets worse. Exercises Single knee to chest Repeat these steps 3-5 times for each leg: Lie on your back on a firm bed or the floor with your legs extended. Bring one knee to your chest. Your other leg should stay extended and in contact with the floor. Hold your knee in place by grabbing your knee or thigh with both hands and hold. Pull on your knee until you feel a gentle stretch in your lower back or buttocks. Hold the stretch for 10-30 seconds. Slowly release and straighten your leg.  Pelvic tilt Repeat these steps 5-10 times: Lie on your back on a firm bed or the floor with your legs extended. Bend your knees so they are pointing toward the ceiling and your feet are flat on the floor. Tighten your lower abdominal muscles to press your lower back against the floor. This motion will tilt your pelvis so your tailbone points up toward the ceiling instead of pointing to your feet or the floor. With gentle tension and even breathing, hold this position for 5-10 seconds.  Cat-cow Repeat these steps until your lower back becomes  more flexible: Get into a hands-and-knees position on a firm bed or the floor. Keep your hands under your shoulders, and keep your knees under your hips. You may place padding under your knees for comfort. Let your head hang down toward your chest. Contract your abdominal muscles and point your tailbone toward the floor so your lower back becomes rounded like the back of a cat. Hold this position for 5 seconds. Slowly lift your head, let your abdominal muscles relax, and point your tailbone up toward the ceiling so your back forms a sagging arch like the back of a cow. Hold this position for 5 seconds.  Press-ups Repeat these steps 5-10 times: Lie on your abdomen (face-down) on a firm bed or the floor. Place your palms near your head, about shoulder-width apart. Keeping your back as relaxed as possible and keeping your hips on the floor, slowly straighten your arms to raise the top half of your body and lift your shoulders. Do not use your back muscles to raise your upper torso. You may adjust the placement of your hands to make yourself more comfortable. Hold this position for 5 seconds while you keep your back relaxed. Slowly return to lying flat on the floor.  Bridges Repeat these steps 10 times: Lie on your back on a firm bed or the floor. Bend your knees so they are pointing toward the ceiling and your feet are flat on the floor. Your arms should be flat  at your sides, next to your body. Tighten your buttocks muscles and lift your buttocks off the floor until your waist is at almost the same height as your knees. You should feel the muscles working in your buttocks and the back of your thighs. If you do not feel these muscles, slide your feet 1-2 inches (2.5-5 cm) farther away from your buttocks. Hold this position for 3-5 seconds. Slowly lower your hips to the starting position, and allow your buttocks muscles to relax completely. If this exercise is too easy, try doing it with your arms  crossed over your chest. Abdominal crunches Repeat these steps 5-10 times: Lie on your back on a firm bed or the floor with your legs extended. Bend your knees so they are pointing toward the ceiling and your feet are flat on the floor. Cross your arms over your chest. Tip your chin slightly toward your chest without bending your neck. Tighten your abdominal muscles and slowly raise your torso high enough to lift your shoulder blades a tiny bit off the floor. Avoid raising your torso higher than that because it can put too much stress on your lower back and does not help to strengthen your abdominal muscles. Slowly return to your starting position.  Back lifts Repeat these steps 5-10 times: Lie on your abdomen (face-down) with your arms at your sides, and rest your forehead on the floor. Tighten the muscles in your legs and your buttocks. Slowly lift your chest off the floor while you keep your hips pressed to the floor. Keep the back of your head in line with the curve in your back. Your eyes should be looking at the floor. Hold this position for 3-5 seconds. Slowly return to your starting position.  Contact a health care provider if: Your back pain or discomfort gets much worse when you do an exercise. Your worsening back pain or discomfort does not lessen within 2 hours after you exercise. If you have any of these problems, stop doing these exercises right away. Do not do them again unless your health care provider says that you can. Get help right away if: You develop sudden, severe back pain. If this happens, stop doing the exercises right away. Do not do them again unless your health care provider says that you can. This information is not intended to replace advice given to you by your health care provider. Make sure you discuss any questions you have with your health care provider. Document Revised: 02/14/2021 Document Reviewed: 11/02/2020 Elsevier Patient Education  2022 [place]. Acute Back Pain, Adult Acute back pain is sudden and usually short-lived. It is often caused by an injury to the muscles and tissues in the back. The injury may result from: A muscle, tendon, or ligament getting overstretched or torn. Ligaments are tissues that connect bones to each other. Lifting something improperly can cause a back strain. Wear and tear (degeneration) of the spinal disks. Spinal disks are circular tissue that provide cushioning between the bones of the spine (vertebrae). Twisting motions, such as while playing sports or doing yard work. A hit to the back. Arthritis. You may have a physical exam, lab tests, and imaging tests to find the cause of your pain. Acute back pain usually goes away with rest and home care. Follow these instructions at home: Managing pain, stiffness, and swelling Take over-the-counter and prescription medicines only as told by your health care provider. Treatment may include medicines for pain and inflammation that are taken  by mouth or applied to the skin, or muscle relaxants. Your health care provider may recommend applying ice during the first 24-48 hours after your pain starts. To do this: Put ice in a plastic bag. Place a towel between your skin and the bag. Leave the ice on for 20 minutes, 2-3 times a day. Remove the ice if your skin turns bright red. This is very important. If you cannot feel pain, heat, or cold, you have a greater risk of damage to the area. If directed, apply heat to the affected area as often as told by your health care provider. Use the heat source that your health care provider recommends, such as a moist heat pack or a heating pad. Place a towel between your skin and the heat source. Leave the heat on for 20-30 minutes. Remove the heat if your skin turns bright red. This is especially important if you are unable to feel pain, heat, or cold. You have a greater risk of getting burned. Activity  Do not stay in bed.  Staying in bed for more than 1-2 days can delay your recovery. Sit up and stand up straight. Avoid leaning forward when you sit or hunching over when you stand. If you work at a desk, sit close to it so you do not need to lean over. Keep your chin tucked in. Keep your neck drawn back, and keep your elbows bent at a 90-degree angle (right angle). Sit high and close to the steering wheel when you drive. Add lower back (lumbar) support to your car seat, if needed. Take short walks on even surfaces as soon as you are able. Try to increase the length of time you walk each day. Do not sit, drive, or stand in one place for more than 30 minutes at a time. Sitting or standing for long periods of time can put stress on your back. Do not drive or use heavy machinery while taking prescription pain medicine. Use proper lifting techniques. When you bend and lift, use positions that put less stress on your back: [place] your knees. Keep the load close to your body. Avoid twisting. Exercise regularly as told by your health care provider. Exercising helps your back heal faster and helps prevent back injuries by keeping muscles strong and flexible. Work with a physical therapist to make a safe exercise program, as recommended by your health care provider. Do any exercises as told by your physical therapist. Lifestyle Maintain a healthy weight. Extra weight puts stress on your back and makes it difficult to have good posture. Avoid activities or situations that make you feel anxious or stressed. Stress and anxiety increase muscle tension and can make back pain worse. Learn ways to manage anxiety and stress, such as through exercise. General instructions Sleep on a firm mattress in a comfortable position. Try lying on your side with your knees slightly bent. If you lie on your back, put a pillow under your knees. Keep your head and neck in a straight line with your spine (neutral position) when using electronic equipment  like smartphones or pads. To do this: Raise your smartphone or pad to look at it instead of bending your head or neck to look down. Put the smartphone or pad at the level of your face while looking at the screen. Follow your treatment plan as told by your health care provider. This may include: Cognitive or behavioral therapy. Acupuncture or massage therapy. Meditation or yoga. Contact a health care provider if: You  have pain that is not relieved with rest or medicine. You have increasing pain going down into your legs or buttocks. Your pain does not improve after 2 weeks. You have pain at night. You lose weight without trying. You have a fever or chills. You develop nausea or vomiting. You develop abdominal pain. Get help right away if: You develop new bowel or bladder control problems. You have unusual weakness or numbness in your arms or legs. You feel faint. These symptoms may represent a serious problem that is an emergency. Do not wait to see if the symptoms will go away. Get medical help right away. Call your local emergency services (911 in the [place] Do not drive yourself to the hospital. Summary Acute back pain is sudden and usually short-lived. Use proper lifting techniques. When you bend and lift, use positions that put less stress on your back. Take over-the-counter and prescription medicines only as told by your health care provider, and apply heat or ice as told. This information is not intended to replace advice given to you by your health care provider. Make sure you discuss any questions you have with your health care provider. Document Revised: 11/11/2020 Document Reviewed: 11/11/2020 Elsevier Patient Education  [place].

## 2021-10-23 ENCOUNTER — Other Ambulatory Visit: Payer: Self-pay

## 2021-10-23 ENCOUNTER — Ambulatory Visit (INDEPENDENT_AMBULATORY_CARE_PROVIDER_SITE_OTHER): Payer: PPO | Admitting: Physician Assistant

## 2021-10-23 VITALS — BP 128/80 | HR 88 | Temp 98.9°F | Ht 66.0 in | Wt 157.1 lb

## 2021-10-23 DIAGNOSIS — M545 Low back pain, unspecified: Secondary | ICD-10-CM | POA: Diagnosis not present

## 2021-10-23 MED ORDER — METHYLPREDNISOLONE SODIUM SUCC 40 MG IJ SOLR: 40.0000 mg | Freq: Once | INTRAMUSCULAR | Status: DC

## 2021-10-23 NOTE — Progress Notes (Signed)
Pt is her for a Solu-Medrol injection  Pt tolerated the injection well  Pt waited in office for 15 min   No reaction to the injection

## 2021-11-01 ENCOUNTER — Encounter: Payer: Self-pay | Admitting: Physician Assistant

## 2021-11-01 DIAGNOSIS — M545 Low back pain, unspecified: Secondary | ICD-10-CM

## 2021-11-01 MED ORDER — METHOCARBAMOL 500 MG PO TABS: 500.0000 mg | ORAL_TABLET | Freq: Three times a day (TID) | ORAL | 0 refills | Status: DC | PRN

## 2021-11-23 ENCOUNTER — Encounter: Payer: Self-pay | Admitting: Hematology and Oncology

## 2021-11-23 ENCOUNTER — Other Ambulatory Visit: Payer: Self-pay

## 2021-11-23 DIAGNOSIS — Z170 Estrogen receptor positive status [ER+]: Secondary | ICD-10-CM

## 2021-11-30 ENCOUNTER — Ambulatory Visit: Payer: PPO

## 2021-11-30 NOTE — Therapy (Incomplete)
?OUTPATIENT PHYSICAL THERAPY ONCOLOGY EVALUATION ? ?Patient Name: Dawn Booker ?MRN: [id number] ?DOB:02-Nov-1955, 66 y.o., female ?Today's Date: 11/30/2021 ? ? ? ?Past Medical History:  ?Diagnosis Date  ? ADD (attention deficit disorder)   ? Anxiety   ? Cancer [place])   ? breast cancer  ? Depression   ? GERD (gastroesophageal reflux disease)   ? Hiatal hernia   ? History of radiation therapy 08/13/2018- 09/25/2018  ? Left Chest wall and IM nodes/ 50 Gy in 25 fractions, Left supraclaicular fossa and PAB/ 50 Gy in 25 fractions, Left chest wall scar boost/ 10 Gy in 5 fractions.   ? Hyperlipemia, mixed   ? Intermittent palpitations   ? Malignant neoplasm of overlapping sites of left breast in female, estrogen receptor positive ([place]) 05/06/2018  ? oncologist-  dr Lindi Adie--  Invasive Lobular Cancer, CIS, Stage IB, Grade 2 (pT3,pN1a,cM0), ER+---  s/p  left mastectomy with sln dissections and right mastectomy (benign)  ? PONV (postoperative nausea and vomiting)   ? Right thyroid nodule   ? Shingles 12/2017  ? Tennis elbow   ? ?Past Surgical History:  ?Procedure Laterality Date  ? AXILLARY LYMPH NODE DISSECTION Left 07/09/2018  ? Procedure: COMPLETION OF LEFT AXILLARY LYMPH NODE DISSECTION;  Surgeon: Jovita Kussmaul, MD;  Location: [facility];  Service: General;  Laterality: Left;  ? BREAST EXCISIONAL BIOPSY Left 1995  ? benign  ? CARDIOVASCULAR STRESS TEST  06/21/2017  ? normal nuclear stress study w/ no ischemia/  normal LV function and wall function, nuclear stress ef 70%  ? ELBOW SURGERY Left child  ? MASTECTOMY W/ SENTINEL NODE BIOPSY Left 06/13/2018  ? MASTECTOMY WITH RADIOACTIVE SEED GUIDED EXCISION AND AXILLARY SENTINEL LYMPH NODE BIOPSY Bilateral 06/13/2018  ? Procedure: LEFT MASTECTOMY WITH SENTINEL LYMPH NODE MAPPING AND TARGETED NODE DISSECTION AND RIGHT PROPHYLATIC MASTECTOMY;  Surgeon: Jovita Kussmaul, MD;  Location: [facility];  Service: General;  Laterality: Bilateral;  ? THYROIDECTOMY Left 10-31-2001   dr gerkin '@[facility]'$   ?  TRANSTHORACIC ECHOCARDIOGRAM  06/21/2017  ? ef 60-65%/  trivial MR (no evidence mvp)  ? TUBAL LIGATION  yrs ago  ? VAGINAL HYSTERECTOMY  1990s  ? ?Patient Active Problem List  ? Diagnosis Date Noted  ? Anal spasm 08/25/2020  ? Biliary dyskinesia 08/25/2020  ? Colon cancer screening 08/25/2020  ? Constipation 08/25/2020  ? Diaphragmatic hernia 08/25/2020  ? Flatulence, eructation and gas pain 08/25/2020  ? Left lower quadrant pain 08/25/2020  ? Nausea 08/25/2020  ? Periumbilical pain 81/85/6314  ? Insomnia 06/26/2019  ? Glucose intolerance (impaired glucose tolerance) 06/26/2019  ? Hyperlipidemia 06/26/2019  ? Vitamin D deficiency 06/26/2019  ? Osteopenia after menopause 06/26/2019  ? Need for vaccination 06/09/2019  ? H/O left mastectomy 06/09/2019  ? MDD (major depressive disorder), recurrent episode, moderate ([place]) 05/06/2019  ? GAD (generalized anxiety disorder) 05/06/2019  ? Insomnia due to mental condition 05/06/2019  ? Cancer of left female breast ([place]) 06/13/2018  ? H/O bilateral mastectomy 06/13/2018  ? Malignant neoplasm of overlapping sites of left breast in female, estrogen receptor positive ([place]) 05/09/2018  ? Blurring of visual image of both eyes 12/06/2017  ? Postherpetic neuralgia 12/06/2017  ? Nasal turbinate hypertrophy 10/09/2016  ? Nasal congestion 08/31/2016  ? Deviated septum 08/31/2016  ? ESOPHAGEAL REFLUX 10/10/2009  ? ? ?PCP: Lorrene Reid, PA-C ? ?REFERRING PROVIDER: Nicholas Lose, MD ? ?REFERRING DIAG: *** ? ?THERAPY DIAG:  ?No diagnosis found. ? ?ONSET DATE: *** ? ?SUBJECTIVE                                                                                                                                                                                          ? ?  SUBJECTIVE STATEMENT: ? ?PERTINENT HISTORY:  ? ?PAIN:  ?Are you having pain? {yes/no:20286} ?NPRS scale: ***/10 ?Pain location: *** ?Pain orientation: {Pain Orientation:25161}  ?PAIN TYPE: {type:313116} ?Pain description: {PAIN  DESCRIPTION:21022940}  ?Aggravating factors: *** ?Relieving factors: *** ? ?PRECAUTIONS: {Therapy precautions:24002} ? ?WEIGHT BEARING RESTRICTIONS {Yes ***/No:24003} ? ?FALLS:  ?Has patient fallen in last 6 months? {fallsyesno:27318} ? ?LIVING ENVIRONMENT: ?Lives with: {[facility] lives with:25569::"lives with their family"} ?Lives in: {Lives in:25570} ?Stairs: {yes/no:20286}; {Stairs:24000} ?Has following equipment at home: {Assistive devices:23999} ? ?OCCUPATION: *** ? ?LEISURE: *** ? ?HAND DOMINANCE : {RIGHT/LEFT:21944}  ? ?PRIOR LEVEL OF FUNCTION: {PLOF:24004} ? ?PATIENT GOALS *** ? ? ?OBJECTIVE ? ?COGNITION: ? Overall cognitive status: {cognition:24006}  ? ?PALPATION: *** ? ?OBSERVATIONS / OTHER ASSESSMENTS: *** ? ?SENSATION: ? Light touch: {intact/deficits:24005} ? Stereognosis: {intact/deficits:24005} ? Hot/Cold: {intact/deficits:24005} ? Proprioception: {intact/deficits:24005} ? ?POSTURE: *** ? ?UPPER EXTREMITY AROM/PROM: ? ?A/PROM RIGHT  11/30/2021 ?  ?Shoulder extension   ?Shoulder flexion   ?Shoulder abduction   ?Shoulder internal rotation   ?Shoulder external rotation   ?  (Blank rows = not tested) ? ?A/PROM LEFT  11/30/2021  ?Shoulder extension   ?Shoulder flexion   ?Shoulder abduction   ?Shoulder internal rotation   ?Shoulder external rotation   ?  (Blank rows = not tested) ? ? ?CERVICAL AROM: ?All within normal limits:  ? ? Percent limited  ?Flexion   ?Extension   ?Right lateral flexion   ?Left lateral flexion   ?Right rotation   ?Left rotation   ? ? ? ?UPPER EXTREMITY STRENGTH: *** ? ? ?LE AROM/PROM: ? ?A/PROM Right ?11/30/2021  ?Hip flexion   ?Hip extension   ?Hip abduction   ?Hip adduction   ?Hip internal rotation   ?Hip external rotation   ?Knee flexion   ?Knee extension   ?Ankle dorsiflexion   ?Ankle plantarflexion   ?Ankle inversion   ?Ankle eversion   ? (Blank rows = not tested) ? ?A/PROM LEFT ?11/30/2021  ?Hip flexion   ?Hip extension   ?Hip abduction   ?Hip adduction   ?Hip internal rotation   ?Hip  external rotation   ?Knee flexion   ?Knee extension   ?Ankle dorsiflexion   ?Ankle plantarflexion   ?Ankle inversion   ?Ankle eversion   ? (Blank rows = not tested) ? ?LE MMT: *** ? ?LYMPHEDEMA ASSESSMENTS:  ? ?SURGERY TYPE/DATE: *** ? ?NUMBER OF LYMPH NODES REMOVED: *** ? ?CHEMOTHERAPY: *** ? ?RADIATION:*** ? ?HORMONE TREATMENT: *** ? ?INFECTIONS: *** ? ?LYMPHEDEMA ASSESSMENTS:  ? ?LANDMARK RIGHT  11/30/2021  ?10 cm proximal to olecranon process   ?Olecranon process   ?10 cm proximal to ulnar styloid process   ?Just proximal to ulnar styloid process   ?Across hand at thumb web space   ?At base of 2nd digit   ?(Blank rows = not tested) ? ?[place] LEFT  11/30/2021  ?10 cm proximal to olecranon process   ?Olecranon process   ?10 cm proximal to ulnar styloid process   ?Just proximal to ulnar styloid process   ?Across hand at thumb web space   ?At base of 2nd digit   ?(Blank rows = not tested) ? ? ? ?(Blank rows = not tested) ? ?FUNCTIONAL TESTS:  ?{Functional tests:24029} ? ?GAIT: ?Distance walked: *** ?Assistive device utilized: {Assistive devices:23999} ?Level of assistance: {Levels of assistance:24026} ?Comments: *** ? ? ? ? ? ?QUICK DASH SURVEY: *** ? ? ?TODAY'S TREATMENT  ?*** ? ?PATIENT EDUCATION:  ?Education details: *** ?Person educated: {Person educated:25204} ?Education method: {Education Method:25205} ?  Education comprehension: {Education Comprehension:25206} ? ? ?HOME EXERCISE PROGRAM: ?*** ? ?ASSESSMENT: ? ?CLINICAL IMPRESSION: ?Patient is a *** y.o. *** who was seen today for physical therapy evaluation and treatment for ***.  ? ? ?OBJECTIVE IMPAIRMENTS {opptimpairments:25111}.  ? ?ACTIVITY LIMITATIONS {activity limitations:25113}.  ? ?PERSONAL FACTORS {Personal factors:25162} are also affecting patient's functional outcome.  ? ? ?REHAB POTENTIAL: {rehabpotential:25112} ? ?CLINICAL DECISION MAKING: {clinical decision making:25114} ? ?EVALUATION COMPLEXITY: {Evaluation complexity:25115} ? ?GOALS: ?Goals  reviewed with patient? {yes/no:20286} ? ?SHORT TERM GOALS: Target date: {follow up:25551} ? ?*** ?Baseline: ?Goal status: {GOALSTATUS:25110} ? ?2.  *** ?Baseline:  ?Goal status: {GOALSTATUS:25110} ? ?3.  *** ?B

## 2021-12-18 ENCOUNTER — Ambulatory Visit (INDEPENDENT_AMBULATORY_CARE_PROVIDER_SITE_OTHER): Payer: PPO | Admitting: Physician Assistant

## 2021-12-18 ENCOUNTER — Encounter: Payer: Self-pay | Admitting: Physician Assistant

## 2021-12-18 VITALS — BP 122/84 | HR 78 | Temp 98.1°F | Ht 66.0 in | Wt 160.0 lb

## 2021-12-18 DIAGNOSIS — F411 Generalized anxiety disorder: Secondary | ICD-10-CM | POA: Diagnosis not present

## 2021-12-18 DIAGNOSIS — F331 Major depressive disorder, recurrent, moderate: Secondary | ICD-10-CM | POA: Diagnosis not present

## 2021-12-18 DIAGNOSIS — R079 Chest pain, unspecified: Secondary | ICD-10-CM

## 2021-12-18 DIAGNOSIS — F5105 Insomnia due to other mental disorder: Secondary | ICD-10-CM

## 2021-12-18 MED ORDER — TEMAZEPAM 30 MG PO CAPS: 30.0000 mg | ORAL_CAPSULE | Freq: Every day | ORAL | 0 refills | Status: DC

## 2021-12-18 NOTE — Progress Notes (Signed)
?Established patient acute visit ? ? ?Patient: Dawn Booker   DOB: June 20, 1956   66 y.o. Female  MRN: [id number] ?Visit Date: 12/18/2021 ? ?Chief Complaint  ?Patient presents with  ? Acute Visit  ? Chest Pain  ? ?Subjective  ?  ?HPI  ?Patient presents with c/o chest pain. Patient reports for several years has had right sided chest pain which radiates to the neck. States started having left sided chest pain x 2-3 months that comes and goes, lasts <1 min and sometimes did radiate to the neck, upper back and lower ribcage. Reports has a hx of palpitations. No vomiting or syncope. Denies changes in dizziness from baseline. Sometimes does have mild nausea and jaw pain with episodes. Reports has reduced caffeine intake to 1 cup of coffee in the morning and 1 in the afternoon which is decaffeinated. Does report increased stress. Pt reports has shortness of breath which is now new, has been chronic. Requesting refill of temazepam.  ? ? ?  12/18/2021  ?  2:13 PM 10/11/2021  ?  2:01 PM 09/12/2021  ?  2:33 PM 05/17/2021  ?  1:36 PM 01/10/2021  ?  3:27 PM  ?Depression screen PHQ 2/9  ?Decreased Interest '1 1 1 1 1  '$ ?Down, Depressed, Hopeless '1 1 1 1 1  '$ ?PHQ - 2 Score '2 2 2 2 2  '$ ?Altered sleeping 0 1 0 0 1  ?Tired, decreased energy '1 1 1 '$ 0 1  ?Change in appetite 0 0 1 0 1  ?Feeling bad or failure about yourself  1 0 1 0 0  ?Trouble concentrating 1 0 0 0 1  ?Moving slowly or fidgety/restless 0 0 0 0 0  ?Suicidal thoughts 0 0 0 0 0  ?PHQ-9 Score '5 4 5 2 6  '$ ?Difficult doing work/chores Somewhat difficult Somewhat difficult Somewhat difficult Somewhat difficult Somewhat difficult  ? ? ?  12/18/2021  ?  2:13 PM 09/12/2021  ?  2:33 PM 05/17/2021  ?  1:36 PM 05/06/2019  ?  3:27 PM  ?GAD 7 : Generalized Anxiety Score  ?Nervous, Anxious, on Edge '1 1 1   '$ ?Control/stop worrying 1 0 0   ?Worry too much - different things 1 0 1   ?Trouble relaxing 1 1 0   ?Restless 0 0 0   ?Easily annoyed or irritable 1 1 0   ?Afraid - awful might happen 0 0 0    ?Total GAD 7 Score '5 3 2   '$ ?Anxiety Difficulty Somewhat difficult Somewhat difficult Somewhat difficult   ?  ? Information is confidential and restricted. Go to Review Flowsheets to unlock data.  ? ? ? ? ?Medications: ?Outpatient Medications Prior to Visit  ?Medication Sig  ? acetaminophen (TYLENOL) 500 MG tablet Take 1,000 mg by mouth at bedtime.  ? buPROPion (WELLBUTRIN XL) 150 MG 24 hr tablet Take 1 tablet (150 mg total) by mouth daily.  ? Calcium Carbonate (CALCIUM 500 PO) Take 1,500 mg by mouth daily. Vegan  ? Coenzyme Q10 (COQ10) 100 MG CAPS Take 1 capsule by mouth daily.  ? diphenhydrAMINE (BENADRYL) 25 mg capsule Take 25 mg by mouth at bedtime.  ? folic acid (FOLVITE) 458 MCG tablet Take 400 mcg by mouth daily.  ? Magnesium 400 MG TABS Take 400 mg by mouth.  ? omeprazole (PRILOSEC) 40 MG capsule TAKE 1 CAPSULE BY MOUTH DAILY  ? OVER THE COUNTER MEDICATION 1 tablet daily. Vegan Calcium 1000  ? PROLIA 60 MG/ML SOSY injection Inject into  the skin once.  ? rosuvastatin (CRESTOR) 10 MG tablet TAKE 1 TABLET BY MOUTH DAILY AT BEDTIME  ? [DISCONTINUED] methocarbamol (ROBAXIN) 500 MG tablet Take 1 tablet (500 mg total) by mouth every 8 (eight) hours as needed for muscle spasms.  ? [DISCONTINUED] temazepam (RESTORIL) 30 MG capsule Take 1 capsule (30 mg total) by mouth at bedtime.  ? ?Facility-Administered Medications Prior to Visit  ?Medication Dose Route Frequency Provider  ? methylPREDNISolone sodium succinate (SOLU-MEDROL) 40 mg/mL injection 40 mg  40 mg Intramuscular Once Lorrene Reid, PA-C  ? ? ?Review of Systems ?Review of Systems:  ?A fourteen system review of systems was performed and found to be positive as per HPI. ? ? ?  Objective  ?  ?BP 122/84   Pulse 78   Temp 98.1 ?F (36.7 ?C)   Ht '5\' 6"'$  (1.676 m)   Wt 160 lb (72.6 kg)   SpO2 98%   BMI 25.82 kg/m?  ? ? ?Physical Exam  ?General:  Well Developed, well nourished, appropriate for stated age.  ?Neuro:  Alert and oriented,  extra-ocular muscles  intact, no focal deficits  ?HEENT:  Normocephalic, atraumatic, PERRL, neck supple  ?Skin:  no gross rash, warm, pink. ?Cardiac:  RRR, S1 S2 ?Respiratory: CTA B/L  ?Vascular:  Ext warm, no cyanosis apprec.; cap RF less 2 sec. ?Psych:  No HI/SI, judgement and insight good, Euthymic mood. Full Affect. ? ? ?No results found for any visits on 12/18/21. ? Assessment & Plan  ?  ? ?Chest pain, unspecified: ?-Etiology unclear. EKG obtained: NSR, possible left atrial enlargement, left ventricular hypertrophy with repolarization abnormality, rate 85 bpm, no acute ST-T wave changes. Compared to previous EKGs, left ventricular hypertrophy w/ repolarization is new finding. Will place referral to cardiology for further evaluation. Pt is currently asx and discussed red flag s/sx to monitor for and should seek immediate medical care/call 911. Pt verbalized understanding. Will collect labs to evaluate for other etiologies. Pt has been evaluated for dyspnea by cardiology in the past. Reviewed echocardiogram 06/21/2017: LVEF 60-65%, wall motion normal, left ventricular diastolic function parameters normal. ? ? ?Return if symptoms worsen or fail to improve.  ?   ? ? ? ?Lorrene Reid, PA-C  ?[facility] ?[phone] (phone) ?[phone] (fax) ? ?[facility] ?

## 2021-12-18 NOTE — Patient Instructions (Signed)
Nonspecific Chest Pain Chest pain can be caused by many different conditions. Some causes of chest pain can be life-threatening. These will require treatment right away. Serious causes of chest pain include: Heart attack. A tear in the body's main blood vessel. Redness and swelling (inflammation) around your heart. Blood clot in your lungs. Other causes of chest pain may not be so serious. These include: Heartburn. Anxiety or stress. Damage to bones or muscles in your chest. Lung infections. Chest pain can feel like: Pain or discomfort in your chest. Crushing, pressure, aching, or squeezing pain. Burning or tingling. Dull or sharp pain that is worse when you move, cough, or take a deep breath. Pain or discomfort that is also felt in your back, neck, jaw, shoulder, or arm, or pain that spreads to any of these areas. It is hard to know whether your pain is caused by something that is serious or something that is not so serious. So it is important to see your doctor right away if you have chest pain. Follow these instructions at home: Medicines Take over-the-counter and prescription medicines only as told by your doctor. If you were prescribed an antibiotic medicine, take it as told by your doctor. Do not stop taking the antibiotic even if you start to feel better. Lifestyle  Rest as told by your doctor. Do not use any products that contain nicotine or tobacco, such as cigarettes, e-cigarettes, and chewing tobacco. If you need help quitting, ask your doctor. Do not drink alcohol. Make lifestyle changes as told by your doctor. These may include: Getting regular exercise. Ask your doctor what activities are safe for you. Eating a heart-healthy diet. A diet and nutrition specialist (dietitian) can help you to learn healthy eating options. Staying at a healthy weight. Treating diabetes or high blood pressure, if needed. Lowering your stress. Activities such as yoga and relaxation techniques  can help. General instructions Pay attention to any changes in your symptoms. Tell your doctor about them or any new symptoms. Avoid any activities that cause chest pain. Keep all follow-up visits as told by your doctor. This is important. You may need more testing if your chest pain does not go away. Contact a doctor if: Your chest pain does not go away. You feel depressed. You have a fever. Get help right away if: Your chest pain is worse. You have a cough that gets worse, or you cough up blood. You have very bad (severe) pain in your belly (abdomen). You pass out (faint). You have either of these for no clear reason: Sudden chest discomfort. Sudden discomfort in your arms, back, neck, or jaw. You have shortness of breath at any time. You suddenly start to sweat, or your skin gets clammy. You feel sick to your stomach (nauseous). You throw up (vomit). You suddenly feel lightheaded or dizzy. You feel very weak or tired. Your heart starts to beat fast, or it feels like it is skipping beats. These symptoms may be an emergency. Do not wait to see if the symptoms will go away. Get medical help right away. Call your local emergency services (911 in the [place] Do not drive yourself to the hospital. Summary Chest pain can be caused by many different conditions. The cause may be serious and need treatment right away. If you have chest pain, see your doctor right away. Follow your doctor's instructions for taking medicines and making lifestyle changes. Keep all follow-up visits as told by your doctor. This includes visits for any further   testing if your chest pain does not go away. Be sure to know the signs that show that your condition has become worse. Get help right away if you have these symptoms. This information is not intended to replace advice given to you by your health care provider. Make sure you discuss any questions you have with your health care provider. Document Revised:  11/03/2020 Document Reviewed: 11/03/2020 Elsevier Patient Education  2023 Elsevier Inc.  

## 2021-12-19 ENCOUNTER — Encounter: Payer: Self-pay | Admitting: Physician Assistant

## 2021-12-19 DIAGNOSIS — H43812 Vitreous degeneration, left eye: Secondary | ICD-10-CM | POA: Diagnosis not present

## 2021-12-19 DIAGNOSIS — H2513 Age-related nuclear cataract, bilateral: Secondary | ICD-10-CM | POA: Diagnosis not present

## 2021-12-19 DIAGNOSIS — H524 Presbyopia: Secondary | ICD-10-CM | POA: Diagnosis not present

## 2021-12-19 DIAGNOSIS — H04123 Dry eye syndrome of bilateral lacrimal glands: Secondary | ICD-10-CM | POA: Diagnosis not present

## 2021-12-19 DIAGNOSIS — H52221 Regular astigmatism, right eye: Secondary | ICD-10-CM | POA: Diagnosis not present

## 2021-12-19 DIAGNOSIS — H40013 Open angle with borderline findings, low risk, bilateral: Secondary | ICD-10-CM | POA: Diagnosis not present

## 2021-12-19 LAB — TSH: TSH: 2 u[IU]/mL (ref 0.450–4.500)

## 2021-12-19 LAB — CBC WITH DIFFERENTIAL/PLATELET
Basophils Absolute: 0 10*3/uL (ref 0.0–0.2)
Basos: 0 %
EOS (ABSOLUTE): 0.1 10*3/uL (ref 0.0–0.4)
Eos: 1 %
Hematocrit: 42.4 % (ref 34.0–46.6)
Hemoglobin: 14.2 g/dL (ref 11.1–15.9)
Immature Grans (Abs): 0 10*3/uL (ref 0.0–0.1)
Immature Granulocytes: 0 %
Lymphocytes Absolute: 1.6 10*3/uL (ref 0.7–3.1)
Lymphs: 30 %
MCH: 31.5 pg (ref 26.6–33.0)
MCHC: 33.5 g/dL (ref 31.5–35.7)
MCV: 94 fL (ref 79–97)
Monocytes Absolute: 0.4 10*3/uL (ref 0.1–0.9)
Monocytes: 7 %
Neutrophils Absolute: 3.3 10*3/uL (ref 1.4–7.0)
Neutrophils: 62 %
Platelets: 207 10*3/uL (ref 150–450)
RBC: 4.51 x10E6/uL (ref 3.77–5.28)
RDW: 12.5 % (ref 11.7–15.4)
WBC: 5.4 10*3/uL (ref 3.4–10.8)

## 2021-12-19 LAB — COMPREHENSIVE METABOLIC PANEL
ALT: 23 IU/L (ref 0–32)
AST: 21 IU/L (ref 0–40)
Albumin/Globulin Ratio: 2 (ref 1.2–2.2)
Albumin: 4.4 g/dL (ref 3.8–4.8)
Alkaline Phosphatase: 48 IU/L (ref 44–121)
BUN/Creatinine Ratio: 16 (ref 12–28)
BUN: 13 mg/dL (ref 8–27)
Bilirubin Total: 0.4 mg/dL (ref 0.0–1.2)
CO2: 23 mmol/L (ref 20–29)
Calcium: 8.8 mg/dL (ref 8.7–10.3)
Chloride: 105 mmol/L (ref 96–106)
Creatinine, Ser: 0.79 mg/dL (ref 0.57–1.00)
Globulin, Total: 2.2 g/dL (ref 1.5–4.5)
Glucose: 109 mg/dL — ABNORMAL HIGH (ref 70–99)
Potassium: 4 mmol/L (ref 3.5–5.2)
Sodium: 141 mmol/L (ref 134–144)
Total Protein: 6.6 g/dL (ref 6.0–8.5)
eGFR: 83 mL/min/{1.73_m2} (ref >59)

## 2021-12-19 LAB — B12 AND FOLATE PANEL
Folate: >20 ng/mL (ref >3.0)
Vitamin B-12: 273 pg/mL (ref 232–1245)

## 2022-01-08 ENCOUNTER — Other Ambulatory Visit: Payer: Self-pay | Admitting: Physician Assistant

## 2022-01-08 DIAGNOSIS — F411 Generalized anxiety disorder: Secondary | ICD-10-CM

## 2022-01-08 DIAGNOSIS — F331 Major depressive disorder, recurrent, moderate: Secondary | ICD-10-CM

## 2022-01-09 ENCOUNTER — Encounter: Payer: Self-pay | Admitting: Internal Medicine

## 2022-01-09 ENCOUNTER — Ambulatory Visit: Payer: PPO | Admitting: Internal Medicine

## 2022-01-09 VITALS — BP 110/78 | HR 84 | Ht 66.0 in | Wt 161.2 lb

## 2022-01-09 DIAGNOSIS — R079 Chest pain, unspecified: Secondary | ICD-10-CM | POA: Diagnosis not present

## 2022-01-09 NOTE — Patient Instructions (Signed)
Medication Instructions:   *If you need a refill on your cardiac medications before your next appointment, please call your pharmacy*   Lab Work:  If you have labs (blood work) drawn today and your tests are completely normal, you will receive your results only by: MyChart Message (if you have MyChart) OR A paper copy in the mail If you have any lab test that is abnormal or we need to change your treatment, we will call you to review the results.   Testing/Procedures: Your physician has requested that you have an echocardiogram. Echocardiography is a painless test that uses sound waves to create images of your heart. It provides your doctor with information about the size and shape of your heart and how well your heart's chambers and valves are working. This procedure takes approximately one hour. There are no restrictions for this procedure.    Follow-Up: At [facility], you and your health needs are our priority.  As part of our continuing mission to provide you with exceptional heart care, we have created designated Provider Care Teams.  These Care Teams include your primary Cardiologist (physician) and Advanced Practice Providers (APPs -  Physician Assistants and Nurse Practitioners) who all work together to provide you with the care you need, when you need it.  We recommend signing up for the patient portal called "MyChart".  Sign up information is provided on this After Visit Summary.  MyChart is used to connect with patients for Virtual Visits (Telemedicine).  Patients are able to view lab/test results, encounter notes, upcoming appointments, etc.  Non-urgent messages can be sent to your provider as well.   To learn more about what you can do with MyChart, go to [email or web address].     Important Information About Sugar       

## 2022-01-09 NOTE — Patient Instructions (Signed)
Insomnia Insomnia is a sleep disorder that makes it difficult to fall asleep or stay asleep. Insomnia can cause fatigue, low energy, difficulty concentrating, mood swings, and poor performance at work or school. There are three different ways to classify insomnia: Difficulty falling asleep. Difficulty staying asleep. Waking up too early in the morning. Any type of insomnia can be long-term (chronic) or short-term (acute). Both are common. Short-term insomnia usually lasts for 3 months or less. Chronic insomnia occurs at least three times a week for longer than 3 months. What are the causes? Insomnia may be caused by another condition, situation, or substance, such as: Having certain mental health conditions, such as anxiety and depression. Using caffeine, alcohol, tobacco, or drugs. Having gastrointestinal conditions, such as gastroesophageal reflux disease (GERD). Having certain medical conditions. These include: Asthma. Alzheimer's disease. Stroke. Chronic pain. An overactive thyroid gland (hyperthyroidism). Other sleep disorders, such as restless legs syndrome and sleep apnea. Menopause. Sometimes, the cause of insomnia may not be known. What increases the risk? Risk factors for insomnia include: Gender. Females are affected more often than males. Age. Insomnia is more common as people get older. Stress and certain medical and mental health conditions. Lack of exercise. Having an irregular work schedule. This may include working night shifts and traveling between different time zones. What are the signs or symptoms? If you have insomnia, the main symptom is having trouble falling asleep or having trouble staying asleep. This may lead to other symptoms, such as: Feeling tired or having low energy. Feeling nervous about going to sleep. Not feeling rested in the morning. Having trouble concentrating. Feeling irritable, anxious, or depressed. How is this diagnosed? This condition  may be diagnosed based on: Your symptoms and medical history. Your health care provider may ask about: Your sleep habits. Any medical conditions you have. Your mental health. A physical exam. How is this treated? Treatment for insomnia depends on the cause. Treatment may focus on treating an underlying condition that is causing the insomnia. Treatment may also include: Medicines to help you sleep. Counseling or therapy. Lifestyle adjustments to help you sleep better. Follow these instructions at home: Eating and drinking  Limit or avoid alcohol, caffeinated beverages, and products that contain nicotine and tobacco, especially close to bedtime. These can disrupt your sleep. Do not eat a large meal or eat spicy foods right before bedtime. This can lead to digestive discomfort that can make it hard for you to sleep. Sleep habits  Keep a sleep diary to help you and your health care provider figure out what could be causing your insomnia. Write down: When you sleep. When you wake up during the night. How well you sleep and how rested you feel the next day. Any side effects of medicines you are taking. What you eat and drink. Make your bedroom a dark, comfortable place where it is easy to fall asleep. Put up shades or blackout curtains to block light from outside. Use a white noise machine to block noise. Keep the temperature cool. Limit screen use before bedtime. This includes: Not watching TV. Not using your smartphone, tablet, or computer. Stick to a routine that includes going to bed and waking up at the same times every day and night. This can help you fall asleep faster. Consider making a quiet activity, such as reading, part of your nighttime routine. Try to avoid taking naps during the day so that you sleep better at night. Get out of bed if you are still awake after   15 minutes of trying to sleep. Keep the lights down, but try reading or doing a quiet activity. When you feel  sleepy, go back to bed. General instructions Take over-the-counter and prescription medicines only as told by your health care provider. Exercise regularly as told by your health care provider. However, avoid exercising in the hours right before bedtime. Use relaxation techniques to manage stress. Ask your health care provider to suggest some techniques that may work well for you. These may include: Breathing exercises. Routines to release muscle tension. Visualizing peaceful scenes. Make sure that you drive carefully. Do not drive if you feel very sleepy. Keep all follow-up visits. This is important. Contact a health care provider if: You are tired throughout the day. You have trouble in your daily routine due to sleepiness. You continue to have sleep problems, or your sleep problems get worse. Get help right away if: You have thoughts about hurting yourself or someone else. Get help right away if you feel like you may hurt yourself or others, or have thoughts about taking your own life. Go to your nearest emergency room or: Call 911. Call the National Suicide Prevention Lifeline at 1-800-273-8255 or 988. This is open 24 hours a day. Text the Crisis Text Line at 741741. Summary Insomnia is a sleep disorder that makes it difficult to fall asleep or stay asleep. Insomnia can be long-term (chronic) or short-term (acute). Treatment for insomnia depends on the cause. Treatment may focus on treating an underlying condition that is causing the insomnia. Keep a sleep diary to help you and your health care provider figure out what could be causing your insomnia. This information is not intended to replace advice given to you by your health care provider. Make sure you discuss any questions you have with your health care provider. Document Revised: 07/31/2021 Document Reviewed: 07/31/2021 Elsevier Patient Education  2023 [employer]  

## 2022-01-09 NOTE — Progress Notes (Signed)
? ?Cardiology Office Note ? ? ?Date:  01/09/2022  ? ?ID:  Dawn Booker, DOB [birth year], MRN [id number] ? ?PCP:   Serina Cowper ?Cardiologist:   Dorris Carnes, MD  ? ?Pt present ? ?  ?History of Present Illness: ?Dawn Booker is a 66 y.o. female with hx of breast CA  CP in past  saw her in 2018 when she complained of irregular heart beat, CP  and DOE  ?CT of chest at time showed some plaquing of aorta   I reocmm a lexiscna myoview and echo    Both were normal ? ?Since I saw the pt last she has been diagnosed with L  breast cancer and she underwent bilateral mastectomy and XRT  ? ? ?The pt now presents with CP   She says it is left sided  Starte d3 months ago  She says her last spell was 3 wks ago   had 3 spells total   Not associated with activity    lasted 1 t o2 minutes  ? ?The pt days she piddles around the house   Denies PND   [facility] dog about 2 miles activity   ? ? ?Current Meds  ?Medication Sig  ? acetaminophen (TYLENOL) 500 MG tablet Take 1,000 mg by mouth at bedtime.  ? buPROPion (WELLBUTRIN XL) 150 MG 24 hr tablet TAKE 1 TABLET BY MOUTH DAILY  ? Calcium Carbonate (CALCIUM 500 PO) Take 1,500 mg by mouth daily. Vegan  ? Cholecalciferol (VITAMIN D) 50 MCG (2000 UT) CAPS Take by mouth. 1 capsule daily  ? Coenzyme Q10 (COQ10) 100 MG CAPS Take 1 capsule by mouth daily.  ? Cyanocobalamin (VITAMIN B12) 500 MCG TABS Take by mouth. 1 tablet by mouth daily  ? diphenhydrAMINE (BENADRYL) 25 mg capsule Take 25 mg by mouth at bedtime.  ? folic acid (FOLVITE) 850 MCG tablet Take 400 mcg by mouth daily.  ? Magnesium 400 MG TABS Take 400 mg by mouth.  ? omeprazole (PRILOSEC) 40 MG capsule TAKE 1 CAPSULE BY MOUTH DAILY  ? OVER THE COUNTER MEDICATION 1 tablet daily. Vegan Calcium 1000  ? PROLIA 60 MG/ML SOSY injection Inject into the skin once.  ? rosuvastatin (CRESTOR) 10 MG tablet TAKE 1 TABLET BY MOUTH DAILY AT BEDTIME  ? temazepam (RESTORIL) 30 MG capsule Take 1 capsule (30 mg total) by mouth at bedtime.  ? ?Current  Facility-Administered Medications for the 01/09/22 encounter (Office Visit) with Fay Records, MD  ?Medication  ? methylPREDNISolone sodium succinate (SOLU-MEDROL) 40 mg/mL injection 40 mg  ? ? ? ?Allergies:   Lamictal [lamotrigine], Cymbalta [duloxetine hcl], Hydrocodone, Mobic [meloxicam], Sulfa antibiotics, Brintellix [vortioxetine], Corticosteroids, Morphine, Other, Prednisone, Prozac [fluoxetine hcl], and Zoloft [sertraline]  ? ?Past Medical History:  ?Diagnosis Date  ? ADD (attention deficit disorder)   ? Anxiety   ? Cancer [facility])   ? breast cancer  ? Depression   ? GERD (gastroesophageal reflux disease)   ? Hiatal hernia   ? History of radiation therapy 08/13/2018- 09/25/2018  ? Left Chest wall and IM nodes/ 50 Gy in 25 fractions, Left supraclaicular fossa and PAB/ 50 Gy in 25 fractions, Left chest wall scar boost/ 10 Gy in 5 fractions.   ? Hyperlipemia, mixed   ? Intermittent palpitations   ? Malignant neoplasm of overlapping sites of left breast in female, estrogen receptor positive (Dawn Booker) 05/06/2018  ? oncologist-  dr Lindi Adie--  Invasive Lobular Cancer, CIS, Stage IB, Grade 2 (pT3,pN1a,cM0), ER+---  s/p  left  mastectomy with sln dissections and right mastectomy (benign)  ? PONV (postoperative nausea and vomiting)   ? Right thyroid nodule   ? Shingles 12/2017  ? Tennis elbow   ? ? ?Past Surgical History:  ?Procedure Laterality Date  ? AXILLARY LYMPH NODE DISSECTION Left 07/09/2018  ? Procedure: COMPLETION OF LEFT AXILLARY LYMPH NODE DISSECTION;  Surgeon: Jovita Kussmaul, MD;  Location: [facility];  Service: General;  Laterality: Left;  ? BREAST EXCISIONAL BIOPSY Left 1995  ? benign  ? CARDIOVASCULAR STRESS TEST  06/21/2017  ? normal nuclear stress study w/ no ischemia/  normal LV function and wall function, nuclear stress ef 70%  ? ELBOW SURGERY Left child  ? MASTECTOMY W/ SENTINEL NODE BIOPSY Left 06/13/2018  ? MASTECTOMY WITH RADIOACTIVE SEED GUIDED EXCISION AND AXILLARY SENTINEL LYMPH NODE BIOPSY Bilateral  06/13/2018  ? Procedure: LEFT MASTECTOMY WITH SENTINEL LYMPH NODE MAPPING AND TARGETED NODE DISSECTION AND RIGHT PROPHYLATIC MASTECTOMY;  Surgeon: Jovita Kussmaul, MD;  Location: [place];  Service: General;  Laterality: Bilateral;  ? THYROIDECTOMY Left 10-31-2001   dr gerkin '@[facility]'$   ? TRANSTHORACIC ECHOCARDIOGRAM  06/21/2017  ? ef 60-65%/  trivial MR (no evidence mvp)  ? TUBAL LIGATION  yrs ago  ? VAGINAL HYSTERECTOMY  1990s  ? ? ? ?Social History:  The patient  reports that she quit smoking about 4 years ago. Her smoking use included cigarettes. She smoked an average of 0.25 packs per day. She has never used smokeless tobacco. She reports that she does not currently use alcohol. She reports that she does not use drugs.  ? ?Family History:  The patient's family history includes Bipolar disorder in her father; Breast cancer in her cousin, maternal grandmother, and mother; Heart attack in her mother; Hypertension in her mother.  ? ? ?ROS:  Please see the history of present illness. All other systems are reviewed and  Negative to the above problem except as noted.  ? ? ?PHYSICAL EXAM: ?VS:  BP 110/78   Pulse 84   Ht '5\' 6"'$  (1.676 m)   Wt 161 lb 3.2 oz (73.1 kg)   SpO2 99%   BMI 26.02 kg/m?   ?GEN: Well nourished, well developed, in no acute distress  ?HEENT: normal  ?Neck: no JVD, carotid bruits, or masses ?Cardiac: RRR; no murmurs   No LE  edema  ?Chest   s/p bilateral mastectomy    ?Respiratory:  clear to auscultation bilaterally, ?GI: soft, nontender, nondistended, + BS  No hepatomegaly  ?MS: no deformity Moving all extremities   ?Skin: warm and dry, no rash ?Neuro:  Strength and sensation are intact ?Psych: euthymic mood, full affect ? ? ?EKG:  EKG is not  ordered today.   ? ?Echo 2018    ? ?- Left ventricle: The cavity size was normal. Systolic function was  ?  normal. The estimated ejection fraction was in the range of 60%  ?  to 65%. Wall motion was normal; there were no regional wall  ?  motion abnormalities.  Left ventricular diastolic function  ?  parameters were normal.  ?- Aortic valve: Transvalvular velocity was within the normal range.  ?  There was no stenosis. There was no regurgitation.  ?- Mitral valve: Transvalvular velocity was within the normal range.  ?  There was no evidence for stenosis. There was trivial  ?  regurgitation.  ?- Right ventricle: The cavity size was normal. Wall thickness was  ?  normal. Systolic function was normal.  ?-  Tricuspid valve: There was no regurgitation.  ?- Pericardium, extracardiac: A trivial pericardial effusion was  ?  identified.  ?- Global longitudinal strain -17.8%.  ? ? ?Myoview   2018  ?Nuclear stress EF: 70%. ?Blood pressure demonstrated a normal response to exercise. ?There was no ST segment deviation noted during stress. ?The study is normal. ?This is a low risk study. ?The left ventricular ejection fraction is hyperdynamic (>65%). ?  ?Normal stress nuclear study with no ischemia or infarction; EF 70 with normal wall motion. ? ?Lipid Panel ?   ?Component Value Date/Time  ? CHOL 174 09/21/2021 1047  ? TRIG 131 09/21/2021 1047  ? HDL 54 09/21/2021 1047  ? CHOLHDL 3.2 09/21/2021 1047  ? CHOLHDL 2.4 04/29/2019 0946  ? VLDL 15 04/29/2019 0946  ? [place] 97 09/21/2021 1047  ? ?  ? ?Wt Readings from Last 3 Encounters:  ?01/09/22 161 lb 3.2 oz (73.1 kg)  ?12/18/21 160 lb (72.6 kg)  ?10/23/21 157 lb 1.9 oz (71.3 kg)  ?  ? ? ?ASSESSMENT AND PLAN: ? ?1  CP Atypical for cardiac    She does have some SOB    had XRT to L left chest  though     Will et echo to evaluate     ? ?2  HL   Pt has plaquing of aorta    Currently on Crestor 10 mg   LIpids in Jan LDL 97   HDL 54   I would increase to 20 mg   Dicussed diet   ? ? ?Current medicines are reviewed at length with the patient today.  The patient does not have concerns regarding medicines. ? ?Signed, ?Dorris Carnes, MD  ?01/09/2022 1:03 PM    ?[facility] ?[address], [place], [place]  [address] ?Phone: [phone]; Fax: [phone]  ? ? ? ?

## 2022-01-10 ENCOUNTER — Encounter: Payer: Self-pay | Admitting: Physician Assistant

## 2022-01-10 ENCOUNTER — Ambulatory Visit (INDEPENDENT_AMBULATORY_CARE_PROVIDER_SITE_OTHER): Payer: PPO | Admitting: Physician Assistant

## 2022-01-10 ENCOUNTER — Encounter: Payer: Self-pay | Admitting: Internal Medicine

## 2022-01-10 VITALS — BP 114/78 | HR 79 | Temp 97.3°F | Wt 161.8 lb

## 2022-01-10 DIAGNOSIS — F411 Generalized anxiety disorder: Secondary | ICD-10-CM | POA: Diagnosis not present

## 2022-01-10 DIAGNOSIS — F331 Major depressive disorder, recurrent, moderate: Secondary | ICD-10-CM

## 2022-01-10 DIAGNOSIS — F5105 Insomnia due to other mental disorder: Secondary | ICD-10-CM

## 2022-01-10 DIAGNOSIS — R7303 Prediabetes: Secondary | ICD-10-CM

## 2022-01-10 LAB — POCT GLYCOSYLATED HEMOGLOBIN (HGB A1C): Hemoglobin A1C: 5.8 % — AB (ref 4.0–5.6)

## 2022-01-10 NOTE — Assessment & Plan Note (Signed)
-  Stable. -Continue current medication regimen.  -Will continue to monitor. 

## 2022-01-10 NOTE — Assessment & Plan Note (Signed)
-  PHQ-2 score of 2, PHQ-9 score of 6. ?-Will continue current medication regimen. Encourage to increase physical activity and resume enjoyable activities since her energy levels have improved. ?-Will continue to monitor. ?

## 2022-01-10 NOTE — Assessment & Plan Note (Signed)
-  Stable.  ?-Continue current mediation regimen.  ?-Will continue to monitor. ?

## 2022-01-10 NOTE — Progress Notes (Signed)
?Established patient visit ? ? ?Patient: Dawn Booker   DOB: [birth year]   66 y.o. Female  MRN: [id number] ?Visit Date: 01/10/2022 ? ?Chief Complaint  ?Patient presents with  ? Follow-up  ? ?Subjective  ?  ?HPI  ?Patient presents for chronic follow-up. Patient has no acute concerns. Patient reports sleep is slightly better than usual. Tries to maintain her sleep schedule. Reports her fatigue has improved since she started taking a B12 supplement. Her mood has been stable. Tolerating Wellbutrin XL 150 mg better than SR. Denies labile mood, SI/HI. States has reduced late night snacks and trying to be more active.  ? ? ? ? ?  01/10/2022  ?  1:25 PM 12/18/2021  ?  2:13 PM 10/11/2021  ?  2:01 PM 09/12/2021  ?  2:33 PM 05/17/2021  ?  1:36 PM  ?Depression screen PHQ 2/9  ?Decreased Interest '1 1 1 1 1  ' ?Down, Depressed, Hopeless '1 1 1 1 1  ' ?PHQ - 2 Score '2 2 2 2 2  ' ?Altered sleeping 1 0 1 0 0  ?Tired, decreased energy 0 '1 1 1 ' 0  ?Change in appetite 1 0 0 1 0  ?Feeling bad or failure about yourself  1 1 0 1 0  ?Trouble concentrating 1 1 0 0 0  ?Moving slowly or fidgety/restless 0 0 0 0 0  ?Suicidal thoughts 0 0 0 0 0  ?PHQ-9 Score '6 5 4 5 2  ' ?Difficult doing work/chores  Somewhat difficult Somewhat difficult Somewhat difficult Somewhat difficult  ? ? ?  01/10/2022  ?  1:26 PM 12/18/2021  ?  2:13 PM 09/12/2021  ?  2:33 PM 05/17/2021  ?  1:36 PM  ?GAD 7 : Generalized Anxiety Score  ?Nervous, Anxious, on Edge '1 1 1 1  ' ?Control/stop worrying 1 1 0 0  ?Worry too much - different things 1 1 0 1  ?Trouble relaxing '1 1 1 ' 0  ?Restless 0 0 0 0  ?Easily annoyed or irritable '1 1 1 ' 0  ?Afraid - awful might happen 0 0 0 0  ?Total GAD 7 Score '5 5 3 2  ' ?Anxiety Difficulty  Somewhat difficult Somewhat difficult Somewhat difficult  ? ? ?  ? ? ?Medications: ?Outpatient Medications Prior to Visit  ?Medication Sig  ? acetaminophen (TYLENOL) 500 MG tablet Take 1,000 mg by mouth at bedtime.  ? buPROPion (WELLBUTRIN XL) 150 MG 24 hr tablet TAKE 1 TABLET BY  MOUTH DAILY  ? Calcium Carbonate (CALCIUM 500 PO) Take 1,500 mg by mouth daily. Vegan  ? Cholecalciferol (VITAMIN D) 50 MCG (2000 UT) CAPS Take by mouth. 1 capsule daily  ? Coenzyme Q10 (COQ10) 100 MG CAPS Take 1 capsule by mouth daily.  ? Cyanocobalamin (VITAMIN B12) 500 MCG TABS Take by mouth. 1 tablet by mouth daily  ? diphenhydrAMINE (BENADRYL) 25 mg capsule Take 25 mg by mouth at bedtime.  ? folic acid (FOLVITE) 572 MCG tablet Take 400 mcg by mouth daily.  ? Magnesium 400 MG TABS Take 400 mg by mouth.  ? omeprazole (PRILOSEC) 40 MG capsule TAKE 1 CAPSULE BY MOUTH DAILY  ? OVER THE COUNTER MEDICATION 1 tablet daily. Vegan Calcium 1000  ? PROLIA 60 MG/ML SOSY injection Inject into the skin once.  ? rosuvastatin (CRESTOR) 10 MG tablet TAKE 1 TABLET BY MOUTH DAILY AT BEDTIME  ? temazepam (RESTORIL) 30 MG capsule Take 1 capsule (30 mg total) by mouth at bedtime.  ? ?Facility-Administered Medications Prior to Visit  ?Medication  Dose Route Frequency Provider  ? methylPREDNISolone sodium succinate (SOLU-MEDROL) 40 mg/mL injection 40 mg  40 mg Intramuscular Once Lorrene Reid, PA-C  ? ? ?Review of Systems ?Review of Systems:  ?A fourteen system review of systems was performed and found to be positive as per HPI. ? ?Last CBC ?Lab Results  ?Component Value Date  ? WBC 5.4 12/18/2021  ? HGB 14.2 12/18/2021  ? HCT 42.4 12/18/2021  ? MCV 94 12/18/2021  ? MCH 31.5 12/18/2021  ? RDW 12.5 12/18/2021  ? PLT 207 12/18/2021  ? ?Last metabolic panel ?Lab Results  ?Component Value Date  ? GLUCOSE 109 (H) 12/18/2021  ? NA 141 12/18/2021  ? K 4.0 12/18/2021  ? CL 105 12/18/2021  ? CO2 23 12/18/2021  ? BUN 13 12/18/2021  ? CREATININE 0.79 12/18/2021  ? EGFR 83 12/18/2021  ? CALCIUM 8.8 12/18/2021  ? PROT 6.6 12/18/2021  ? ALBUMIN 4.4 12/18/2021  ? LABGLOB 2.2 12/18/2021  ? AGRATIO 2.0 12/18/2021  ? BILITOT 0.4 12/18/2021  ? ALKPHOS 48 12/18/2021  ? AST 21 12/18/2021  ? ALT 23 12/18/2021  ? ANIONGAP 9 04/29/2019  ? ?Last lipids ?Lab  Results  ?Component Value Date  ? CHOL 174 09/21/2021  ? HDL 54 09/21/2021  ? [place] 97 09/21/2021  ? TRIG 131 09/21/2021  ? CHOLHDL 3.2 09/21/2021  ? ?Last hemoglobin A1c ?Lab Results  ?Component Value Date  ? HGBA1C 5.8 (A) 01/10/2022  ? ?Last thyroid functions ?Lab Results  ?Component Value Date  ? TSH 2.000 12/18/2021  ? T3TOTAL 114 06/23/2019  ? ?Last vitamin D ?Lab Results  ?Component Value Date  ? VD25OH 78.8 01/10/2021  ? ?Last vitamin B12 and Folate ?Lab Results  ?Component Value Date  ? XVQMGQQP61 273 12/18/2021  ? FOLATE >20.0 12/18/2021  ? ?  Objective  ?  ?BP 114/78   Pulse 79   Temp (!) 97.3 ?F (36.3 ?C)   Wt 161 lb 12.8 oz (73.4 kg)   SpO2 97%   BMI 26.12 kg/m?  ?BP Readings from Last 3 Encounters:  ?01/10/22 114/78  ?01/09/22 110/78  ?12/18/21 122/84  ? ?Wt Readings from Last 3 Encounters:  ?01/10/22 161 lb 12.8 oz (73.4 kg)  ?01/09/22 161 lb 3.2 oz (73.1 kg)  ?12/18/21 160 lb (72.6 kg)  ? ? ?Physical Exam  ?General:  Well Developed, well nourished, appropriate for stated age.  ?Neuro:  Alert and oriented,  extra-ocular muscles intact  ?HEENT:  Normocephalic, atraumatic, neck supple  ?Skin:  no gross rash, warm, pink. ?Cardiac:  RRR, S1 S2 ?Respiratory: CTA B/L  ?Vascular:  Ext warm, no cyanosis apprec.; cap RF less 2 sec. ?Psych:  No HI/SI, judgement and insight good, Euthymic mood. Full Affect. ? ? ?Results for orders placed or performed in visit on 01/10/22  ?POCT glycosylated hemoglobin (Hb A1C)  ?Result Value Ref Range  ? Hemoglobin A1C 5.8 (A) 4.0 - 5.6 %  ? HbA1c POC (<> result, manual entry)    ? HbA1c, POC (prediabetic range)    ? HbA1c, POC (controlled diabetic range)    ? ? Assessment & Plan  ?  ? ? ?Problem List Items Addressed This Visit   ? ?  ? Other  ? MDD (major depressive disorder), recurrent episode, moderate ([place])  ?  -PHQ-2 score of 2, PHQ-9 score of 6. ?-Will continue current medication regimen. Encourage to increase physical activity and resume enjoyable activities since  her energy levels have improved. ?-Will continue to monitor. ? ?  ?  ?  GAD (generalized anxiety disorder)  ?  -Stable.  ?-Continue current medication regimen. ?-Will continue to monitor.  ? ?  ?  ? Insomnia due to mental condition - Primary  ?  -Stable.  ?-Continue current mediation regimen.  ?-Will continue to monitor. ? ?  ?  ? Prediabetes  ?  -A1c stable at 5.8, recommend to continue with avoiding late night snacks/eating and monitor simple carbohydrates/sugar intake. Will continue to monitor. ? ?  ?  ? Relevant Orders  ? POCT glycosylated hemoglobin (Hb A1C) (Completed)  ? ?As patient's PCP discussed her EKG to the extent appropriate, however, it would be appropriate for patient to seek cardiology recommendation/clarification regarding EKG findings since it is within their scope and field. Patient had cardiology consult yesterday for evaluation of chest pain. Patient is scheduled for echocardiogram 01/30/2022.  ? ?Return in about 4 months (around 05/13/2022) for CPE and FBW.  ?   ? ? ? ?Lorrene Reid, PA-C  ?[facility] ?[phone] (phone) ?[phone] (fax) ? ?[facility] ?

## 2022-01-10 NOTE — Assessment & Plan Note (Signed)
-  A1c stable at 5.8, recommend to continue with avoiding late night snacks/eating and monitor simple carbohydrates/sugar intake. Will continue to monitor. ?

## 2022-01-15 ENCOUNTER — Ambulatory Visit: Payer: PPO

## 2022-01-18 DIAGNOSIS — Z9011 Acquired absence of right breast and nipple: Secondary | ICD-10-CM | POA: Diagnosis not present

## 2022-01-18 DIAGNOSIS — C50912 Malignant neoplasm of unspecified site of left female breast: Secondary | ICD-10-CM | POA: Diagnosis not present

## 2022-01-30 ENCOUNTER — Ambulatory Visit (HOSPITAL_COMMUNITY): Payer: PPO | Attending: Cardiovascular Disease

## 2022-01-30 DIAGNOSIS — R079 Chest pain, unspecified: Secondary | ICD-10-CM | POA: Diagnosis not present

## 2022-01-30 LAB — ECHOCARDIOGRAM COMPLETE
Area-P 1/2: 3.65 cm2
S' Lateral: 2.5 cm

## 2022-02-08 DIAGNOSIS — H02413 Mechanical ptosis of bilateral eyelids: Secondary | ICD-10-CM | POA: Diagnosis not present

## 2022-02-08 DIAGNOSIS — H57813 Brow ptosis, bilateral: Secondary | ICD-10-CM | POA: Diagnosis not present

## 2022-02-08 DIAGNOSIS — D485 Neoplasm of uncertain behavior of skin: Secondary | ICD-10-CM | POA: Diagnosis not present

## 2022-02-08 DIAGNOSIS — H02834 Dermatochalasis of left upper eyelid: Secondary | ICD-10-CM | POA: Diagnosis not present

## 2022-02-08 DIAGNOSIS — H02831 Dermatochalasis of right upper eyelid: Secondary | ICD-10-CM | POA: Diagnosis not present

## 2022-03-12 ENCOUNTER — Other Ambulatory Visit: Payer: Self-pay | Admitting: Physician Assistant

## 2022-03-12 DIAGNOSIS — F331 Major depressive disorder, recurrent, moderate: Secondary | ICD-10-CM

## 2022-03-12 DIAGNOSIS — F411 Generalized anxiety disorder: Secondary | ICD-10-CM

## 2022-03-12 DIAGNOSIS — F5105 Insomnia due to other mental disorder: Secondary | ICD-10-CM

## 2022-03-16 DIAGNOSIS — Z170 Estrogen receptor positive status [ER+]: Secondary | ICD-10-CM | POA: Diagnosis not present

## 2022-03-16 DIAGNOSIS — C50912 Malignant neoplasm of unspecified site of left female breast: Secondary | ICD-10-CM | POA: Diagnosis not present

## 2022-03-21 NOTE — Patient Instructions (Addendum)
Eustachian Tube Dysfunction  Eustachian tube dysfunction refers to a condition in which a blockage develops in the narrow passage that connects the middle ear to the back of the nose (eustachian tube). The eustachian tube regulates air pressure in the middle ear by letting air move between the ear and nose. It also helps to drain fluid from the middle ear space. Eustachian tube dysfunction can affect one or both ears. When the eustachian tube does not function properly, air pressure, fluid, or both can build up in the middle ear. What are the causes? This condition occurs when the eustachian tube becomes blocked or cannot open normally. Common causes of this condition include: Ear infections. Colds and other infections that affect the nose, mouth, and throat (upper respiratory tract). Allergies. Irritation from cigarette smoke. Irritation from stomach acid coming up into the esophagus (gastroesophageal reflux). The esophagus is the part of the body that moves food from the mouth to the stomach. Sudden changes in air pressure, such as from descending in an airplane or scuba diving. Abnormal growths in the nose or throat, such as: Growths that line the nose (nasal polyps). Abnormal growth of cells (tumors). Enlarged tissue at the back of the throat (adenoids). What increases the risk? You are more likely to develop this condition if: You smoke. You are overweight. You are a child who has: Certain birth defects of the mouth, such as cleft palate. Large tonsils or adenoids. What are the signs or symptoms? Common symptoms of this condition include: A feeling of fullness in the ear. Ear pain. Clicking or popping noises in the ear. Ringing in the ear (tinnitus). Hearing loss. Loss of balance. Dizziness. Symptoms may get worse when the air pressure around you changes, such as when you travel to an area of high elevation, fly on an airplane, or go scuba diving. How is this diagnosed? This  condition may be diagnosed based on: Your symptoms. A physical exam of your ears, nose, and throat. Tests, such as those that measure: The movement of your eardrum. Your hearing (audiometry). How is this treated? Treatment depends on the cause and severity of your condition. In mild cases, you may relieve your symptoms by moving air into your ears. This is called "popping the ears." In more severe cases, or if you have symptoms of fluid in your ears, treatment may include: Medicines to relieve congestion (decongestants). Medicines that treat allergies (antihistamines). Nasal sprays or ear drops that contain medicines that reduce swelling (steroids). A procedure to drain the fluid in your eardrum. In this procedure, a small tube may be placed in the eardrum to: Drain the fluid. Restore the air in the middle ear space. A procedure to insert a balloon device through the nose to inflate the opening of the eustachian tube (balloon dilation). Follow these instructions at home: Lifestyle Do not do any of the following until your health care provider approves: Travel to high altitudes. Fly in airplanes. Work in a pressurized cabin or room. Scuba dive. Do not use any products that contain nicotine or tobacco. These products include cigarettes, chewing tobacco, and vaping devices, such as e-cigarettes. If you need help quitting, ask your health care provider. Keep your ears dry. Wear fitted earplugs during showering and bathing. Dry your ears completely after. General instructions Take over-the-counter and prescription medicines only as told by your health care provider. Use techniques to help pop your ears as recommended by your health care provider. These may include: Chewing gum. Yawning. Frequent, forceful swallowing.   Closing your mouth, holding your nose closed, and gently blowing as if you are trying to blow air out of your nose. Keep all follow-up visits. This is important. Contact a  health care provider if: Your symptoms do not go away after treatment. Your symptoms come back after treatment. You are unable to pop your ears. You have: A fever. Pain in your ear. Pain in your head or neck. Fluid draining from your ear. Your hearing suddenly changes. You become very dizzy. You lose your balance. Get help right away if: You have a sudden, severe increase in any of your symptoms. Summary Eustachian tube dysfunction refers to a condition in which a blockage develops in the eustachian tube. It can be caused by ear infections, allergies, inhaled irritants, or abnormal growths in the nose or throat. Symptoms may include ear pain or fullness, hearing loss, or ringing in the ears. Mild cases are treated with techniques to unblock the ears, such as yawning or chewing gum. More severe cases are treated with medicines or procedures. This information is not intended to replace advice given to you by your health care provider. Make sure you discuss any questions you have with your health care provider. Document Revised: 10/31/2020 Document Reviewed: 10/31/2020 Elsevier Patient Education  2023 [employer]  

## 2022-03-23 ENCOUNTER — Encounter: Payer: Self-pay | Admitting: Physician Assistant

## 2022-03-23 ENCOUNTER — Ambulatory Visit (INDEPENDENT_AMBULATORY_CARE_PROVIDER_SITE_OTHER): Payer: PPO | Admitting: Physician Assistant

## 2022-03-23 VITALS — BP 133/82 | HR 88 | Temp 97.9°F | Wt 161.0 lb

## 2022-03-23 DIAGNOSIS — H6593 Unspecified nonsuppurative otitis media, bilateral: Secondary | ICD-10-CM

## 2022-03-23 DIAGNOSIS — H6983 Other specified disorders of Eustachian tube, bilateral: Secondary | ICD-10-CM | POA: Diagnosis not present

## 2022-03-23 MED ORDER — HYDROCORTISONE-ACETIC ACID 1-2 % OT SOLN: 4.0000 [drp] | Freq: Three times a day (TID) | OTIC | 0 refills | Status: DC

## 2022-03-23 NOTE — Progress Notes (Signed)
Established patient acute visit   Patient: Dawn Booker   DOB: 04/20/1956   66 y.o. Female  MRN: [id number] Visit Date: 03/23/2022  Chief Complaint  Patient presents with   Follow-up    Fluid in right ear x 3 weeks, feels like something is moving in it and sometimes feel pressure   Subjective    HPI HPI     Follow-up    Additional comments: Fluid in right ear x 3 weeks, feels like something is moving in it and sometimes feel pressure      Last edited by Adelfa Koh, CMA on 03/23/2022  9:54 AM.      Patient presents for c/o bilateral ear fluid x 3 weeks. Feels like there is something in there moving. Denies fever, otorrhea, runny nose, tenderness. Does report some itching. Has not tried anything yet.    Medications: Outpatient Medications Prior to Visit  Medication Sig   acetaminophen (TYLENOL) 500 MG tablet Take 1,000 mg by mouth at bedtime.   buPROPion (WELLBUTRIN XL) 150 MG 24 hr tablet TAKE 1 TABLET BY MOUTH DAILY   Calcium Carbonate (CALCIUM 500 PO) Take 1,500 mg by mouth daily. Vegan   Cholecalciferol (VITAMIN D) 50 MCG (2000 UT) CAPS Take by mouth. 1 capsule daily   Coenzyme Q10 (COQ10) 100 MG CAPS Take 1 capsule by mouth daily.   Cyanocobalamin (VITAMIN B12) 500 MCG TABS Take by mouth. 1 tablet by mouth daily   diphenhydrAMINE (BENADRYL) 25 mg capsule Take 25 mg by mouth at bedtime.   folic acid (FOLVITE) 160 MCG tablet Take 400 mcg by mouth daily.   Magnesium 400 MG TABS Take 400 mg by mouth.   omeprazole (PRILOSEC) 40 MG capsule TAKE 1 CAPSULE BY MOUTH DAILY   OVER THE COUNTER MEDICATION 1 tablet daily. Vegan Calcium 1000   PROLIA 60 MG/ML SOSY injection Inject into the skin once.   rosuvastatin (CRESTOR) 10 MG tablet TAKE 1 TABLET BY MOUTH DAILY AT BEDTIME   temazepam (RESTORIL) 30 MG capsule TAKE 1 CAPSULE BY MOUTH EACH NIGHT AT BEDTIME   Facility-Administered Medications Prior to Visit  Medication Dose Route Frequency Provider   methylPREDNISolone  sodium succinate (SOLU-MEDROL) 40 mg/mL injection 40 mg  40 mg Intramuscular Once Brynden Thune, PA-C    Review of Systems Review of Systems:  A fourteen system review of systems was performed and found to be positive as per HPI.  Last CBC Lab Results  Component Value Date   WBC 5.4 12/18/2021   HGB 14.2 12/18/2021   HCT 42.4 12/18/2021   MCV 94 12/18/2021   MCH 31.5 12/18/2021   RDW 12.5 12/18/2021   PLT 207 73/71/0626   Last metabolic panel Lab Results  Component Value Date   GLUCOSE 109 (H) 12/18/2021   NA 141 12/18/2021   K 4.0 12/18/2021   CL 105 12/18/2021   CO2 23 12/18/2021   BUN 13 12/18/2021   CREATININE 0.79 12/18/2021   EGFR 83 12/18/2021   CALCIUM 8.8 12/18/2021   PROT 6.6 12/18/2021   ALBUMIN 4.4 12/18/2021   LABGLOB 2.2 12/18/2021   AGRATIO 2.0 12/18/2021   BILITOT 0.4 12/18/2021   ALKPHOS 48 12/18/2021   AST 21 12/18/2021   ALT 23 12/18/2021   ANIONGAP 9 04/29/2019   Last lipids Lab Results  Component Value Date   CHOL 174 09/21/2021   HDL 54 09/21/2021   LDLCALC 97 09/21/2021   TRIG 131 09/21/2021   CHOLHDL 3.2 09/21/2021   Last hemoglobin  A1c Lab Results  Component Value Date   HGBA1C 5.8 (A) 01/10/2022   Last thyroid functions Lab Results  Component Value Date   TSH 2.000 12/18/2021   T3TOTAL 114 06/23/2019     Objective    BP 133/82   Pulse 88   Temp 97.9 F (36.6 C) (Oral)   Wt 161 lb (73 kg)   BMI 25.99 kg/m    Physical Exam  General:  Pleasant and cooperative, in no acute distress, appropriate for stated age.  Neuro:  Alert and oriented,  extra-ocular muscles intact  HEENT:  Normocephalic, atraumatic, PERRL, no sinus tenderness, bilateral middle ear effusion (mild), no redness or tenderness, neck supple, no adenopathy   Skin:  no gross rash, warm, pink. Cardiac:  RRR, S1 S2 Respiratory: CTA B/L  Vascular:  Ext warm, no cyanosis apprec.; cap RF less 2 sec. Psych:  No HI/SI, judgement and insight good, Euthymic  mood. Full Affect.   No results found for any visits on 03/23/22.  Assessment & Plan     Discussed with patient has s/sx suggestive of ETD. Will send rx for acetic acid-hydrocortisone 4 gtt in both ears TID x 7 days. Advised to start oral antihistamine such as Claritin or Allegra. Reassurance provided no signs of ear infection present at this time, advised to monitor for new symptoms such as fever, tenderness or otorrhea which could associated with otitis media and need antibiotic therapy. Pt verbalized understanding.    Return if symptoms worsen or fail to improve.        Lorrene Reid, PA-C  [facility] [phone] (phone) [phone] (fax)  [place]

## 2022-03-25 ENCOUNTER — Encounter: Payer: Self-pay | Admitting: Physician Assistant

## 2022-03-25 DIAGNOSIS — H6593 Unspecified nonsuppurative otitis media, bilateral: Secondary | ICD-10-CM

## 2022-03-26 ENCOUNTER — Other Ambulatory Visit: Payer: Self-pay

## 2022-03-26 DIAGNOSIS — H6593 Unspecified nonsuppurative otitis media, bilateral: Secondary | ICD-10-CM

## 2022-03-26 MED ORDER — HYDROCORTISONE-ACETIC ACID 1-2 % OT SOLN: 4.0000 [drp] | Freq: Three times a day (TID) | OTIC | 0 refills | Status: DC

## 2022-03-28 ENCOUNTER — Encounter: Payer: Self-pay | Admitting: Physical Therapy

## 2022-03-28 ENCOUNTER — Other Ambulatory Visit: Payer: Self-pay

## 2022-03-28 ENCOUNTER — Ambulatory Visit: Payer: PPO | Attending: Hematology and Oncology | Admitting: Physical Therapy

## 2022-03-28 DIAGNOSIS — M62838 Other muscle spasm: Secondary | ICD-10-CM | POA: Diagnosis not present

## 2022-03-28 DIAGNOSIS — Z170 Estrogen receptor positive status [ER+]: Secondary | ICD-10-CM

## 2022-03-28 DIAGNOSIS — L599 Disorder of the skin and subcutaneous tissue related to radiation, unspecified: Secondary | ICD-10-CM

## 2022-03-28 DIAGNOSIS — C50812 Malignant neoplasm of overlapping sites of left female breast: Secondary | ICD-10-CM | POA: Diagnosis not present

## 2022-03-28 DIAGNOSIS — I890 Lymphedema, not elsewhere classified: Secondary | ICD-10-CM

## 2022-03-28 NOTE — Therapy (Signed)
OUTPATIENT PHYSICAL THERAPY ONCOLOGY EVALUATION  Patient Name: Dawn Booker MRN: [id number] DOB:08/09/1956, 66 y.o., female Today's Date: 03/28/2022   PT End of Session - 03/28/22 1557     Visit Number 1    Number of Visits 9    Date for PT Re-Evaluation 04/25/22    PT Start Time 1505    PT Stop Time 1552    PT Time Calculation (min) 47 min    Activity Tolerance Patient tolerated treatment well    Behavior During Therapy [facility] for tasks assessed/performed             Past Medical History:  Diagnosis Date   ADD (attention deficit disorder)    Anxiety    Cancer ([place])    breast cancer   Depression    GERD (gastroesophageal reflux disease)    Hiatal hernia    History of radiation therapy 08/13/2018- 09/25/2018   Left Chest wall and IM nodes/ 50 Gy in 25 fractions, Left supraclaicular fossa and PAB/ 50 Gy in 25 fractions, Left chest wall scar boost/ 10 Gy in 5 fractions.    Hyperlipemia, mixed    Intermittent palpitations    Malignant neoplasm of overlapping sites of left breast in female, estrogen receptor positive ([place]) 05/06/2018   oncologist-  dr Lindi Adie--  Invasive Lobular Cancer, CIS, Stage IB, Grade 2 (pT3,pN1a,cM0), ER+---  s/p  left mastectomy with sln dissections and right mastectomy (benign)   PONV (postoperative nausea and vomiting)    Right thyroid nodule    Shingles 12/2017   Tennis elbow    Past Surgical History:  Procedure Laterality Date   AXILLARY LYMPH NODE DISSECTION Left 07/09/2018   Procedure: COMPLETION OF LEFT AXILLARY LYMPH NODE DISSECTION;  Surgeon: [name], MD;  Location: [facility];  Service: General;  Laterality: Left;   BREAST EXCISIONAL BIOPSY Left 1995   benign   CARDIOVASCULAR STRESS TEST  06/21/2017   normal nuclear stress study w/ no ischemia/  normal LV function and wall function, nuclear stress ef 70%   ELBOW SURGERY Left child   MASTECTOMY W/ SENTINEL NODE BIOPSY Left 06/13/2018   MASTECTOMY WITH RADIOACTIVE SEED GUIDED EXCISION  AND AXILLARY SENTINEL LYMPH NODE BIOPSY Bilateral 06/13/2018   Procedure: LEFT MASTECTOMY WITH SENTINEL LYMPH NODE MAPPING AND TARGETED NODE DISSECTION AND RIGHT PROPHYLATIC MASTECTOMY;  Surgeon: Jovita Kussmaul, MD;  Location: [place] [facility];  Service: General;  Laterality: Bilateral;   THYROIDECTOMY Left 10-31-2001   dr gerkin _0    TRANSTHORACIC ECHOCARDIOGRAM  06/21/2017   ef 60-65%/  trivial MR (no evidence mvp)   TUBAL LIGATION  yrs ago   [place]   Patient Active Problem List   Diagnosis Date Noted   Prediabetes 01/10/2022   Anal spasm 08/25/2020   Biliary dyskinesia 08/25/2020   Colon cancer screening 08/25/2020   Constipation 08/25/2020   Diaphragmatic hernia 08/25/2020   Flatulence, eructation and gas pain 08/25/2020   Left lower quadrant pain 08/25/2020   Nausea 09/62/8366   Periumbilical pain 29/47/6546   Insomnia 06/26/2019   Glucose intolerance (impaired glucose tolerance) 06/26/2019   Hyperlipidemia 06/26/2019   Vitamin D deficiency 06/26/2019   Osteopenia after menopause 06/26/2019   Need for vaccination 06/09/2019   H/O left mastectomy 06/09/2019   MDD (major depressive disorder), recurrent episode, moderate ([place]) 05/06/2019   GAD (generalized anxiety disorder) 05/06/2019   Insomnia due to mental condition 05/06/2019   Cancer of left female breast ([place]) 06/13/2018   H/O bilateral mastectomy 06/13/2018  Malignant neoplasm of overlapping sites of left breast in female, estrogen receptor positive ([employer]) 05/09/2018   Blurring of visual image of both eyes 12/06/2017   Postherpetic neuralgia 12/06/2017   Nasal turbinate hypertrophy 10/09/2016   Nasal congestion 08/31/2016   Deviated septum 08/31/2016   ESOPHAGEAL REFLUX 10/10/2009    PCP: Lorrene Reid, PA-C  REFERRING PROVIDER: Nicholas Lose, MD   REFERRING DIAG: C50.812,Z17.0 (ICD-10-CM) - Malignant neoplasm of overlapping sites of left breast in female, estrogen receptor positive ([place])    THERAPY DIAG:  Other muscle spasm  Disorder of the skin and subcutaneous tissue related to radiation, unspecified  Lymphedema, not elsewhere classified  Malignant neoplasm of overlapping sites of left breast in female, estrogen receptor positive ([place])  ONSET DATE: 04/25/18  Rationale for Evaluation and Treatment Rehabilitation  SUBJECTIVE                                                                                                                                                                                           SUBJECTIVE STATEMENT: I have been having some pain in the back especially when reaching backwards. I have been using the [place] 5-6x/wk.  PERTINENT HISTORY:  Patient was diagnosed on 04/25/18 with left grade II invasive lobular carci1noma breast cancer. There are 2 areas: 2.6 cm in the upper outer quadrant and 6 mm in the upper inner quadrant. Both are ER/PR positive and HER2 negative with a Ki67 of 10% and she has a positive axillary node. Bilateral mastectomy on 06/13/2018 wiht 7 nodes removed and the 10 in second surgery on 07/09/2018 She had radiation and did well.  PAIN:  Are you having pain? No pt reports she has discomfort in her left lateral trunk but it is not pain  PRECAUTIONS: Other: at risk for lymphedema on L side  WEIGHT BEARING RESTRICTIONS No  FALLS:  Has patient fallen in last 6 months? No  LIVING ENVIRONMENT: Lives with: lives with their spouse Lives in: House/apartment Stairs: Yes; Internal: 13 steps; on right going up and External: 5 steps; can reach both Has following equipment at home: None  OCCUPATION: retired  LEISURE: pt does not exercise  HAND DOMINANCE : right   PRIOR LEVEL OF FUNCTION: Independent  PATIENT GOALS to try to get relief from discomfort in L lateral trunk   OBJECTIVE  COGNITION:  Overall cognitive status: Within functional limits for tasks assessed   PALPATION: Some fullness/ muscle tightness in L lateral  trunk and increased muscle tightness in L scapular muscles   POSTURE: rounded head and forward shoulders   UPPER EXTREMITY AROM/PROM: WFL bilaterally    LYMPHEDEMA ASSESSMENTS:  SURGERY TYPE/DATE: 06/13/18  NUMBER OF LYMPH NODES REMOVED: 7 with surgery on 06/13/18 then another 10 during second surgery on 07/09/2018  CHEMOTHERAPY: did not receive  RADIATION:completed  HORMONE TREATMENT: unable to tolerate  INFECTIONS: none  LYMPHEDEMA ASSESSMENTS:   LANDMARK RIGHT  eval  10 cm proximal to olecranon process 28.3  Olecranon process 24  10 cm proximal to ulnar styloid process 21  Just proximal to ulnar styloid process 15  Across hand at thumb web space 18.5  At base of 2nd digit 5.8  (Blank rows = not tested)  LANDMARK LEFT  eval  10 cm proximal to olecranon process 27.5  Olecranon process 23.5  10 cm proximal to ulnar styloid process 20  Just proximal to ulnar styloid process 15.3  Across hand at thumb web space 18.1  At base of 2nd digit 5.8  (Blank rows = not tested)    QUICK DASH SURVEY:   Dawn Booker - 03/28/22 0001     Open a tight or new jar Mild difficulty    Do heavy household chores (wash walls, wash floors) Mild difficulty    Carry a shopping bag or briefcase No difficulty    Wash your back Mild difficulty    Use a knife to cut food No difficulty    Recreational activities in which you take some force or impact through your arm, shoulder, or hand (golf, hammering, tennis) Severe difficulty    During the past week, to what extent has your arm, shoulder or hand problem interfered with your normal social activities with family, friends, neighbors, or groups? Not at all    During the past week, to what extent has your arm, shoulder or hand problem limited your work or other regular daily activities Slightly    Arm, shoulder, or hand pain. None    Tingling (pins and needles) in your arm, shoulder, or hand None    Difficulty Sleeping No difficulty    DASH  Score 15.91 %               TODAY'S TREATMENT  03/28/22: STM in R S/L to left lateral trunk including serratus, edge of lats and teres using cocoa butter to help decrease muscle tightness  PATIENT EDUCATION:  Education details: self massage with tennis ball on wall Person educated: Patient Education method: Explanation Education comprehension: verbalized understanding   HOME EXERCISE PROGRAM: Try self massage with tennis ball on wall  ASSESSMENT:  CLINICAL IMPRESSION: Patient is a 66 y.o. female who was seen today for physical therapy evaluation and treatment for left lateral trunk and scapular discomfort. She reports this began about a year ago. Every time she reaches backwards she has a spasm in her scapular muscles. Her bilateral shoulder ROM is WFL. She may have some minor lymphedema in her left lateral trunk. She does use the flexitouch compression pump 5-6x/wk. She has increased muscle tightness per palpation in area of lats and serratus. Pt would benefit from skilled PT services to decrease L lateral trunk pain and to help pt with independent management of her edema.     OBJECTIVE IMPAIRMENTS increased edema, increased fascial restrictions, increased muscle spasms, postural dysfunction, and pain.   ACTIVITY LIMITATIONS  reaching backwards  PARTICIPATION LIMITATIONS:  none  PERSONAL FACTORS  none  are also affecting patient's functional outcome.   REHAB POTENTIAL: Good  CLINICAL DECISION MAKING: Stable/uncomplicated  EVALUATION COMPLEXITY: Low  GOALS: Goals reviewed with patient? Yes  SHORT TERM GOALS = LONG TERM GOALS  Target  date: 04/25/2022    Pt will report a 75% improvement in pain and discomfort in L lateral trunk when reaching backwards Baseline: Goal status: INITIAL  2.  Pt will be independent in a home exercise program for continued stretching to decrease muscle spasms.  Baseline:  Goal status: INITIAL  PLAN: PT FREQUENCY: 2x/week  PT DURATION:  4 weeks  PLANNED INTERVENTIONS: Therapeutic exercises, Therapeutic activity, Patient/Family education, Self Care, Manual lymph drainage, Compression bandaging, scar mobilization, Vasopneumatic device, and Manual therapy  PLAN FOR NEXT SESSION: STM to L lateral trunk, stretching for lats and serratus   [employer], PT 03/28/2022, 4:00 PM

## 2022-03-29 ENCOUNTER — Encounter: Payer: Self-pay | Admitting: Physical Therapy

## 2022-03-29 ENCOUNTER — Ambulatory Visit: Payer: PPO | Admitting: Physical Therapy

## 2022-03-29 DIAGNOSIS — S1096XA Insect bite of unspecified part of neck, initial encounter: Secondary | ICD-10-CM | POA: Diagnosis not present

## 2022-03-29 DIAGNOSIS — I890 Lymphedema, not elsewhere classified: Secondary | ICD-10-CM

## 2022-03-29 DIAGNOSIS — D225 Melanocytic nevi of trunk: Secondary | ICD-10-CM | POA: Diagnosis not present

## 2022-03-29 DIAGNOSIS — C50812 Malignant neoplasm of overlapping sites of left female breast: Secondary | ICD-10-CM | POA: Diagnosis not present

## 2022-03-29 DIAGNOSIS — D1801 Hemangioma of skin and subcutaneous tissue: Secondary | ICD-10-CM | POA: Diagnosis not present

## 2022-03-29 DIAGNOSIS — Z170 Estrogen receptor positive status [ER+]: Secondary | ICD-10-CM

## 2022-03-29 DIAGNOSIS — L700 Acne vulgaris: Secondary | ICD-10-CM | POA: Diagnosis not present

## 2022-03-29 DIAGNOSIS — L821 Other seborrheic keratosis: Secondary | ICD-10-CM | POA: Diagnosis not present

## 2022-03-29 DIAGNOSIS — M62838 Other muscle spasm: Secondary | ICD-10-CM

## 2022-03-29 DIAGNOSIS — L599 Disorder of the skin and subcutaneous tissue related to radiation, unspecified: Secondary | ICD-10-CM

## 2022-03-29 DIAGNOSIS — L814 Other melanin hyperpigmentation: Secondary | ICD-10-CM | POA: Diagnosis not present

## 2022-03-29 DIAGNOSIS — L578 Other skin changes due to chronic exposure to nonionizing radiation: Secondary | ICD-10-CM | POA: Diagnosis not present

## 2022-03-29 NOTE — Therapy (Signed)
OUTPATIENT PHYSICAL THERAPY ONCOLOGY TREATMENT  Patient Name: Dawn Booker MRN: [id number] DOB:08/30/1956, 66 y.o., female Today's Date: 03/29/2022   PT End of Session - 03/29/22 1006     Visit Number 2    Number of Visits 9    Date for PT Re-Evaluation 04/25/22    PT Start Time 1005    PT Stop Time 1052    PT Time Calculation (min) 47 min    Activity Tolerance Patient tolerated treatment well    Behavior During Therapy WFL for tasks assessed/performed             Past Medical History:  Diagnosis Date   ADD (attention deficit disorder)    Anxiety    Cancer ([place])    breast cancer   Depression    GERD (gastroesophageal reflux disease)    Hiatal hernia    History of radiation therapy 08/13/2018- 09/25/2018   Left Chest wall and IM nodes/ 50 Gy in 25 fractions, Left supraclaicular fossa and PAB/ 50 Gy in 25 fractions, Left chest wall scar boost/ 10 Gy in 5 fractions.    Hyperlipemia, mixed    Intermittent palpitations    Malignant neoplasm of overlapping sites of left breast in female, estrogen receptor positive ([place]) 05/06/2018   oncologist-  dr Lindi Adie--  Invasive Lobular Cancer, CIS, Stage IB, Grade 2 (pT3,pN1a,cM0), ER+---  s/p  left mastectomy with sln dissections and right mastectomy (benign)   PONV (postoperative nausea and vomiting)    Right thyroid nodule    Shingles 12/2017   Tennis elbow    Past Surgical History:  Procedure Laterality Date   AXILLARY LYMPH NODE DISSECTION Left 07/09/2018   Procedure: COMPLETION OF LEFT AXILLARY LYMPH NODE DISSECTION;  Surgeon: [name], MD;  [facility];  Service: General;  Laterality: Left;   BREAST EXCISIONAL BIOPSY Left 1995   benign   CARDIOVASCULAR STRESS TEST  06/21/2017   normal nuclear stress study w/ no ischemia/  normal LV function and wall function, nuclear stress ef 70%   ELBOW SURGERY Left child   MASTECTOMY W/ SENTINEL NODE BIOPSY Left 06/13/2018   MASTECTOMY WITH RADIOACTIVE SEED GUIDED EXCISION  AND AXILLARY SENTINEL LYMPH NODE BIOPSY Bilateral 06/13/2018   Procedure: LEFT MASTECTOMY WITH SENTINEL LYMPH NODE MAPPING AND TARGETED NODE DISSECTION AND RIGHT PROPHYLATIC MASTECTOMY;  Surgeon: Jovita Kussmaul, MD;  Location: [place] [facility];  Service: General;  Laterality: Bilateral;   THYROIDECTOMY Left 10-31-2001   dr gerkin '@[facility]'    TRANSTHORACIC ECHOCARDIOGRAM  06/21/2017   ef 60-65%/  trivial MR (no evidence mvp)   TUBAL LIGATION  yrs ago   [place]   Patient Active Problem List   Diagnosis Date Noted   Prediabetes 01/10/2022   Anal spasm 08/25/2020   Biliary dyskinesia 08/25/2020   Colon cancer screening 08/25/2020   Constipation 08/25/2020   Diaphragmatic hernia 08/25/2020   Flatulence, eructation and gas pain 08/25/2020   Left lower quadrant pain 08/25/2020   Nausea 83/66/2947   Periumbilical pain 65/46/5035   Insomnia 06/26/2019   Glucose intolerance (impaired glucose tolerance) 06/26/2019   Hyperlipidemia 06/26/2019   Vitamin D deficiency 06/26/2019   Osteopenia after menopause 06/26/2019   Need for vaccination 06/09/2019   H/O left mastectomy 06/09/2019   MDD (major depressive disorder), recurrent episode, moderate ([place]) 05/06/2019   GAD (generalized anxiety disorder) 05/06/2019   Insomnia due to mental condition 05/06/2019   Cancer of left female breast ([place]) 06/13/2018   H/O bilateral mastectomy 06/13/2018  Malignant neoplasm of overlapping sites of left breast in female, estrogen receptor positive ([place]) 05/09/2018   Blurring of visual image of both eyes 12/06/2017   Postherpetic neuralgia 12/06/2017   Nasal turbinate hypertrophy 10/09/2016   Nasal congestion 08/31/2016   Deviated septum 08/31/2016   ESOPHAGEAL REFLUX 10/10/2009    PCP: Lorrene Reid, PA-C  REFERRING PROVIDER: Nicholas Lose, MD   REFERRING DIAG: C50.812,Z17.0 (ICD-10-CM) - Malignant neoplasm of overlapping sites of left breast in female, estrogen receptor positive ([place])    THERAPY DIAG:  Other muscle spasm  Disorder of the skin and subcutaneous tissue related to radiation, unspecified  Lymphedema, not elsewhere classified  Malignant neoplasm of overlapping sites of left breast in female, estrogen receptor positive ([place])  ONSET DATE: 04/25/18  Rationale for Evaluation and Treatment Rehabilitation  SUBJECTIVE                                                                                                                                                                                           SUBJECTIVE STATEMENT: When I took my shower this morning I did not have any pain.   PERTINENT HISTORY:  Patient was diagnosed on 04/25/18 with left grade II invasive lobular carci1noma breast cancer. There are 2 areas: 2.6 cm in the upper outer quadrant and 6 mm in the upper inner quadrant. Both are ER/PR positive and HER2 negative with a Ki67 of 10% and she has a positive axillary node. Bilateral mastectomy on 06/13/2018 wiht 7 nodes removed and the 10 in second surgery on 07/09/2018 She had radiation and did well.  PAIN:  Are you having pain? No pt reports she has discomfort in her left lateral trunk but it is not pain  PRECAUTIONS: Other: at risk for lymphedema on L side  WEIGHT BEARING RESTRICTIONS No  FALLS:  Has patient fallen in last 6 months? No  LIVING ENVIRONMENT: Lives with: lives with their spouse Lives in: House/apartment Stairs: Yes; Internal: 13 steps; on right going up and External: 5 steps; can reach both Has following equipment at home: None  OCCUPATION: retired  LEISURE: pt does not exercise  HAND DOMINANCE : right   PRIOR LEVEL OF FUNCTION: Independent  PATIENT GOALS to try to get relief from discomfort in L lateral trunk   OBJECTIVE  COGNITION:  Overall cognitive status: Within functional limits for tasks assessed   PALPATION: Some fullness/ muscle tightness in L lateral trunk and increased muscle tightness in L scapular  muscles   POSTURE: rounded head and forward shoulders   UPPER EXTREMITY AROM/PROM: [facility] bilaterally    LYMPHEDEMA ASSESSMENTS:   SURGERY TYPE/DATE: 06/13/18  NUMBER OF  LYMPH NODES REMOVED: 7 with surgery on 06/13/18 then another 10 during second surgery on 07/09/2018  CHEMOTHERAPY: did not receive  RADIATION:completed  HORMONE TREATMENT: unable to tolerate  INFECTIONS: none  LYMPHEDEMA ASSESSMENTS:   LANDMARK RIGHT  eval  10 cm proximal to olecranon process 28.3  Olecranon process 24  10 cm proximal to ulnar styloid process 21  Just proximal to ulnar styloid process 15  Across hand at thumb web space 18.5  At base of 2nd digit 5.8  (Blank rows = not tested)  LANDMARK LEFT  eval  10 cm proximal to olecranon process 27.5  Olecranon process 23.5  10 cm proximal to ulnar styloid process 20  Just proximal to ulnar styloid process 15.3  Across hand at thumb web space 18.1  At base of 2nd digit 5.8  (Blank rows = not tested)    QUICK DASH SURVEY:        TODAY'S TREATMENT  03/29/22: STM in R S/L to left lateral trunk including serratus, edge of lats and teres using cocoa butter to help decrease muscle tightness with improvement noted by end of session  03/28/22: STM in R S/L to left lateral trunk including serratus, edge of lats and teres using cocoa butter to help decrease muscle tightness  PATIENT EDUCATION:  Education details: self massage with tennis ball on wall Person educated: Patient Education method: Explanation Education comprehension: verbalized understanding   HOME EXERCISE PROGRAM: Try self massage with tennis ball on wall  ASSESSMENT:  CLINICAL IMPRESSION: Continued with soft tissue mobilization to L lateral trunk in area of serratus and lats to help decrease muscle tightness. Pt felt improvement by end of session. She also reports she did not have pain this morning in the shower after yesterdays treatment.    OBJECTIVE IMPAIRMENTS increased  edema, increased fascial restrictions, increased muscle spasms, postural dysfunction, and pain.   ACTIVITY LIMITATIONS  reaching backwards  PARTICIPATION LIMITATIONS:  none  PERSONAL FACTORS  none  are also affecting patient's functional outcome.   REHAB POTENTIAL: Good  CLINICAL DECISION MAKING: Stable/uncomplicated  EVALUATION COMPLEXITY: Low  GOALS: Goals reviewed with patient? Yes  SHORT TERM GOALS = LONG TERM GOALS  Target date: 04/26/2022    Pt will report a 75% improvement in pain and discomfort in L lateral trunk when reaching backwards Baseline: Goal status: INITIAL  2.  Pt will be independent in a home exercise program for continued stretching to decrease muscle spasms.  Baseline:  Goal status: INITIAL  PLAN: PT FREQUENCY: 2x/week  PT DURATION: 4 weeks  PLANNED INTERVENTIONS: Therapeutic exercises, Therapeutic activity, Patient/Family education, Self Care, Manual lymph drainage, Compression bandaging, scar mobilization, Vasopneumatic device, and Manual therapy  PLAN FOR NEXT SESSION: STM to L lateral trunk, stretching for lats and serratus   [employer], PT 03/29/2022, 10:57 AM

## 2022-04-02 ENCOUNTER — Ambulatory Visit: Payer: PPO | Admitting: Rehabilitation

## 2022-04-02 ENCOUNTER — Encounter: Payer: Self-pay | Admitting: Rehabilitation

## 2022-04-02 ENCOUNTER — Other Ambulatory Visit: Payer: Self-pay | Admitting: Physician Assistant

## 2022-04-02 DIAGNOSIS — Z170 Estrogen receptor positive status [ER+]: Secondary | ICD-10-CM

## 2022-04-02 DIAGNOSIS — L599 Disorder of the skin and subcutaneous tissue related to radiation, unspecified: Secondary | ICD-10-CM

## 2022-04-02 DIAGNOSIS — C50812 Malignant neoplasm of overlapping sites of left female breast: Secondary | ICD-10-CM | POA: Diagnosis not present

## 2022-04-02 DIAGNOSIS — E785 Hyperlipidemia, unspecified: Secondary | ICD-10-CM

## 2022-04-02 DIAGNOSIS — I890 Lymphedema, not elsewhere classified: Secondary | ICD-10-CM

## 2022-04-02 DIAGNOSIS — M62838 Other muscle spasm: Secondary | ICD-10-CM

## 2022-04-02 NOTE — Therapy (Signed)
OUTPATIENT PHYSICAL THERAPY ONCOLOGY TREATMENT  Patient Name: Dawn Booker MRN: [id number] DOB:05/19/1956, 66 y.o., female Today's Date: 04/02/2022   PT End of Session - 04/02/22 0959     Visit Number 3    Number of Visits 9    Date for PT Re-Evaluation 04/25/22    PT Start Time 1000    PT Stop Time 1057    PT Time Calculation (min) 57 min    Activity Tolerance Patient tolerated treatment well    Behavior During Therapy [facility] for tasks assessed/performed             Past Medical History:  Diagnosis Date   ADD (attention deficit disorder)    Anxiety    Cancer ([place])    breast cancer   Depression    GERD (gastroesophageal reflux disease)    Hiatal hernia    History of radiation therapy 08/13/2018- 09/25/2018   Left Chest wall and IM nodes/ 50 Gy in 25 fractions, Left supraclaicular fossa and PAB/ 50 Gy in 25 fractions, Left chest wall scar boost/ 10 Gy in 5 fractions.    Hyperlipemia, mixed    Intermittent palpitations    Malignant neoplasm of overlapping sites of left breast in female, estrogen receptor positive ([place]) 05/06/2018   oncologist-  dr Lindi Adie--  Invasive Lobular Cancer, CIS, Stage IB, Grade 2 (pT3,pN1a,cM0), ER+---  s/p  left mastectomy with sln dissections and right mastectomy (benign)   PONV (postoperative nausea and vomiting)    Right thyroid nodule    Shingles 12/2017   Tennis elbow    Past Surgical History:  Procedure Laterality Date   AXILLARY LYMPH NODE DISSECTION Left 07/09/2018   Procedure: COMPLETION OF LEFT AXILLARY LYMPH NODE DISSECTION;  Surgeon: [name], MD;  Location: [facility];  Service: General;  Laterality: Left;   BREAST EXCISIONAL BIOPSY Left 1995   benign   CARDIOVASCULAR STRESS TEST  06/21/2017   normal nuclear stress study w/ no ischemia/  normal LV function and wall function, nuclear stress ef 70%   ELBOW SURGERY Left child   MASTECTOMY W/ SENTINEL NODE BIOPSY Left 06/13/2018   MASTECTOMY WITH RADIOACTIVE SEED GUIDED EXCISION  AND AXILLARY SENTINEL LYMPH NODE BIOPSY Bilateral 06/13/2018   Procedure: LEFT MASTECTOMY WITH SENTINEL LYMPH NODE MAPPING AND TARGETED NODE DISSECTION AND RIGHT PROPHYLATIC MASTECTOMY;  Surgeon: Jovita Kussmaul, MD;  Location: [place] [facility];  Service: General;  Laterality: Bilateral;   THYROIDECTOMY Left 10-31-2001   dr gerkin _0    TRANSTHORACIC ECHOCARDIOGRAM  06/21/2017   ef 60-65%/  trivial MR (no evidence mvp)   TUBAL LIGATION  yrs ago   [place]   Patient Active Problem List   Diagnosis Date Noted   Prediabetes 01/10/2022   Anal spasm 08/25/2020   Biliary dyskinesia 08/25/2020   Colon cancer screening 08/25/2020   Constipation 08/25/2020   Diaphragmatic hernia 08/25/2020   Flatulence, eructation and gas pain 08/25/2020   Left lower quadrant pain 08/25/2020   Nausea 56/43/3295   Periumbilical pain 18/84/1660   Insomnia 06/26/2019   Glucose intolerance (impaired glucose tolerance) 06/26/2019   Hyperlipidemia 06/26/2019   Vitamin D deficiency 06/26/2019   Osteopenia after menopause 06/26/2019   Need for vaccination 06/09/2019   H/O left mastectomy 06/09/2019   MDD (major depressive disorder), recurrent episode, moderate ([place]) 05/06/2019   GAD (generalized anxiety disorder) 05/06/2019   Insomnia due to mental condition 05/06/2019   Cancer of left female breast ([place]) 06/13/2018   H/O bilateral mastectomy 06/13/2018  Malignant neoplasm of overlapping sites of left breast in female, estrogen receptor positive ([place]) 05/09/2018   Blurring of visual image of both eyes 12/06/2017   Postherpetic neuralgia 12/06/2017   Nasal turbinate hypertrophy 10/09/2016   Nasal congestion 08/31/2016   Deviated septum 08/31/2016   ESOPHAGEAL REFLUX 10/10/2009    PCP: Lorrene Reid, PA-C  REFERRING PROVIDER: Nicholas Lose, MD   REFERRING DIAG: C50.812,Z17.0 (ICD-10-CM) - Malignant neoplasm of overlapping sites of left breast in female, estrogen receptor positive ([place])    THERAPY DIAG:  Other muscle spasm  Disorder of the skin and subcutaneous tissue related to radiation, unspecified  Lymphedema, not elsewhere classified  Malignant neoplasm of overlapping sites of left breast in female, estrogen receptor positive ([place])  ONSET DATE: 04/25/18  Rationale for Evaluation and Treatment Rehabilitation  SUBJECTIVE                                                                                                                                                                                           SUBJECTIVE STATEMENT: I guess its better.  I have not had it catch in the back but I did have it catch in the front.    PERTINENT HISTORY:  Patient was diagnosed on 04/25/18 with left grade II invasive lobular carci1noma breast cancer. There are 2 areas: 2.6 cm in the upper outer quadrant and 6 mm in the upper inner quadrant. Both are ER/PR positive and HER2 negative with a Ki67 of 10% and she has a positive axillary node. Bilateral mastectomy on 06/13/2018 wiht 7 nodes removed and the 10 in second surgery on 07/09/2018 She had radiation and did well.  PAIN:  Are you having pain? No pt reports she has discomfort in her left lateral trunk but it is not pain  PRECAUTIONS: Other: at risk for lymphedema on L side , osteoporosis    WEIGHT BEARING RESTRICTIONS No  FALLS:  Has patient fallen in last 6 months? No  LIVING ENVIRONMENT: Lives with: lives with their spouse Lives in: House/apartment Stairs: Yes; Internal: 13 steps; on right going up and External: 5 steps; can reach both Has following equipment at home: None  OCCUPATION: retired  LEISURE: pt does not exercise  HAND DOMINANCE : right   PRIOR LEVEL OF FUNCTION: Independent  PATIENT GOALS to try to get relief from discomfort in L lateral trunk   OBJECTIVE  COGNITION:  Overall cognitive status: Within functional limits for tasks assessed   PALPATION: Some fullness/ muscle tightness in L lateral  trunk and increased muscle tightness in L scapular muscles   POSTURE: rounded head and forward shoulders   UPPER EXTREMITY AROM/PROM:  WFL bilaterally    LYMPHEDEMA ASSESSMENTS:   SURGERY TYPE/DATE: 06/13/18  NUMBER OF LYMPH NODES REMOVED: 7 with surgery on 06/13/18 then another 10 during second surgery on 07/09/2018  CHEMOTHERAPY: did not receive  RADIATION:completed  HORMONE TREATMENT: unable to tolerate  INFECTIONS: none  LYMPHEDEMA ASSESSMENTS:   LANDMARK RIGHT  eval  10 cm proximal to olecranon process 28.3  Olecranon process 24  10 cm proximal to ulnar styloid process 21  Just proximal to ulnar styloid process 15  Across hand at thumb web space 18.5  At base of 2nd digit 5.8  (Blank rows = not tested)  LANDMARK LEFT  eval  10 cm proximal to olecranon process 27.5  Olecranon process 23.5  10 cm proximal to ulnar styloid process 20  Just proximal to ulnar styloid process 15.3  Across hand at thumb web space 18.1  At base of 2nd digit 5.8  (Blank rows = not tested)  TODAY'S TREATMENT  04/02/22: STM in R S/L to left lateral trunk including serratus, edge of lats and teres using cocoa butter to help decrease muscle tightness with improvement noted by end of session and then in supine assessed incision mobility with some lateral inferior stiffness and tightness so included some work here as well.  Discussed breathing, and added exercises to HEP to start per below.   03/29/22: STM in R S/L to left lateral trunk including serratus, edge of lats and teres using cocoa butter to help decrease muscle tightness with improvement noted by end of session  03/28/22: STM in R S/L to left lateral trunk including serratus, edge of lats and teres using cocoa butter to help decrease muscle tightness  PATIENT EDUCATION:  Education details: self massage with tennis ball on wall Person educated: Patient Education method: Explanation Education comprehension: verbalized  understanding   HOME EXERCISE PROGRAM: Try self massage with tennis ball on wall Access Code: [email or web address] URL: [email or web address] Date: 04/02/2022 Prepared by: Shan Levans  Exercises - Hooklying Rib Cage Breathing  - 1 x daily - 7 x weekly - 1 sets - 10 reps - Supine Lower Trapezius Strengthening  - 1 x daily - 7 x weekly - 1 sets - 5 reps - Supine Bilateral Shoulder Protraction  - 1 x daily - 7 x weekly - 1 sets - 10 reps - Supine Alternating Shoulder Flexion  - 1 x daily - 7 x weekly - 1- sets - 10 reps - no hold - Supine Chest Stretch with Elbows Bent  - 1 x daily - 7 x weekly - 1 sets - 3 reps - 30-60seconds hold - Hooklying Shoulder T  - 1 x daily - 7 x weekly - 1 sets - 10 reps - 2-3 second hold ASSESSMENT:  CLINICAL IMPRESSION: Pt is having much less posterior pain but now noticed some anterior chest muscle pain mainly with deep laughing.  Added some breath work and exercises to address mobility with difficulty performing seated protraction/retraction noting that the left side felt hard to do.  Also added some anterior STM and gentle MFR.   OBJECTIVE IMPAIRMENTS increased edema, increased fascial restrictions, increased muscle spasms, postural dysfunction, and pain.   ACTIVITY LIMITATIONS  reaching backwards  PARTICIPATION LIMITATIONS:  none  PERSONAL FACTORS  none  are also affecting patient's functional outcome.   REHAB POTENTIAL: Good  CLINICAL DECISION MAKING: Stable/uncomplicated  EVALUATION COMPLEXITY: Low  GOALS: Goals reviewed with patient? Yes  SHORT TERM GOALS = LONG TERM GOALS  Target date: 04/30/2022  Pt will report a 75% improvement in pain and discomfort in L lateral trunk when reaching backwards Baseline: Goal status: INITIAL  2.  Pt will be independent in a home exercise program for continued stretching to decrease muscle spasms.  Baseline:  Goal status: INITIAL  PLAN: PT FREQUENCY: 2x/week  PT DURATION: 4 weeks  PLANNED  INTERVENTIONS: Therapeutic exercises, Therapeutic activity, Patient/Family education, Self Care, Manual lymph drainage, Compression bandaging, scar mobilization, Vasopneumatic device, and Manual therapy  PLAN FOR NEXT SESSION: STM to L lateral trunk, stretching for lats and serratus   Jenina Moening, Adrian Prince, PT 04/02/2022, 11:01 AM

## 2022-04-05 ENCOUNTER — Encounter: Payer: Self-pay | Admitting: Nurse Practitioner

## 2022-04-05 ENCOUNTER — Ambulatory Visit (INDEPENDENT_AMBULATORY_CARE_PROVIDER_SITE_OTHER): Payer: PPO | Admitting: Nurse Practitioner

## 2022-04-05 VITALS — BP 127/85 | HR 84 | Temp 98.7°F | Ht 65.0 in | Wt 161.0 lb

## 2022-04-05 DIAGNOSIS — M545 Low back pain, unspecified: Secondary | ICD-10-CM | POA: Diagnosis not present

## 2022-04-05 MED ORDER — METHYLPREDNISOLONE SODIUM SUCC 40 MG IJ SOLR
40.0000 mg | Freq: Once | INTRAMUSCULAR | Status: AC
  Administered 2022-04-05: 40 mg via INTRAMUSCULAR

## 2022-04-05 NOTE — Progress Notes (Signed)
Established patient visit   Patient: Dawn Booker   DOB: November 19, 1955   66 y.o. Female  MRN: [id number] Visit Date: 04/05/2022  Chief Complaint  Patient presents with   Acute Visit    Patient presents today for back pain x 1 week. She has been using tylenol with a little help. She feels this happened from working I her flower bed.    Subjective    HPI Acute right sided low back pain.  -started after doing a great deal of yard work and working in her garden. States that this pain feels exactly the same as it did the last time.  -bending and lifting make this worse.  -initally, hwn waking up, pain is worse.  -taking tylenol does help some -when this happened in the past, solu-medrol 40 mg injection helped the most  -muscle relaxer did not help at all.   HPI     Acute Visit    Additional comments: Patient presents today for back pain x 1 week. She has been using tylenol with a little help. She feels this happened from working I her flower bed.       Last edited by Pennelope Bracken, CMA on 04/05/2022  3:17 PM.        Medications: Outpatient Medications Prior to Visit  Medication Sig   acetaminophen (TYLENOL) 500 MG tablet Take 1,000 mg by mouth at bedtime.   buPROPion (WELLBUTRIN XL) 150 MG 24 hr tablet TAKE 1 TABLET BY MOUTH DAILY   Calcium Carbonate (CALCIUM 500 PO) Take 1,500 mg by mouth daily. Vegan   Cholecalciferol (VITAMIN D) 50 MCG (2000 UT) CAPS Take by mouth. 1 capsule daily   Coenzyme Q10 (COQ10) 100 MG CAPS Take 1 capsule by mouth daily.   Cyanocobalamin (VITAMIN B12) 500 MCG TABS Take by mouth. 1 tablet by mouth daily   diphenhydrAMINE (BENADRYL) 25 mg capsule Take 25 mg by mouth at bedtime.   folic acid (FOLVITE) 867 MCG tablet Take 400 mcg by mouth daily.   Magnesium 400 MG TABS Take 400 mg by mouth.   omeprazole (PRILOSEC) 40 MG capsule TAKE 1 CAPSULE BY MOUTH DAILY   OVER THE COUNTER MEDICATION 1 tablet daily. Vegan Calcium 1000   PROLIA 60 MG/ML SOSY  injection Inject into the skin once.   rosuvastatin (CRESTOR) 10 MG tablet TAKE 1 TABLET BY MOUTH DAILY AT BEDTIME   temazepam (RESTORIL) 30 MG capsule TAKE 1 CAPSULE BY MOUTH EACH NIGHT AT BEDTIME   acetic acid-hydrocortisone (VOSOL-HC) OTIC solution Place 4 drops into both ears 3 (three) times daily.   [DISCONTINUED] methylPREDNISolone sodium succinate (SOLU-MEDROL) 40 mg/mL injection 40 mg    No facility-administered medications prior to visit.    Review of Systems  Constitutional:  Positive for activity change. Negative for appetite change, chills, fatigue and fever.       Activity decrease due to severe back pain   HENT:  Negative for congestion, postnasal drip, rhinorrhea, sinus pressure, sinus pain, sneezing and sore throat.   Eyes: Negative.   Respiratory:  Negative for cough, chest tightness, shortness of breath and wheezing.   Cardiovascular:  Negative for chest pain and palpitations.  Gastrointestinal:  Negative for abdominal pain, constipation, diarrhea, nausea and vomiting.  Endocrine: Negative for cold intolerance, heat intolerance, polydipsia and polyuria.  Genitourinary:  Negative for dyspareunia, dysuria, flank pain, frequency and urgency.  Musculoskeletal:  Positive for back pain, gait problem and myalgias. Negative for arthralgias.  Skin:  Negative for rash.  Allergic/Immunologic:  Negative for environmental allergies.  Neurological:  Negative for dizziness, weakness and headaches.  Hematological:  Negative for adenopathy.  Psychiatric/Behavioral:  The patient is not nervous/anxious.      Objective     Today's Vitals   04/05/22 1518  BP: 127/85  Pulse: 84  Temp: 98.7 F (37.1 C)  SpO2: 99%  Weight: 161 lb (73 kg)  Height: '5\' 5"'$  (1.651 m)   Body mass index is 26.79 kg/m.   Physical Exam Vitals and nursing note reviewed.  Constitutional:      Appearance: Normal appearance. She is well-developed.  HENT:     Head: Normocephalic and atraumatic.  Eyes:      Pupils: Pupils are equal, round, and reactive to light.  Cardiovascular:     Rate and Rhythm: Normal rate and regular rhythm.     Pulses: Normal pulses.     Heart sounds: Normal heart sounds.  Pulmonary:     Effort: Pulmonary effort is normal.     Breath sounds: Normal breath sounds.  Abdominal:     Palpations: Abdomen is soft.  Musculoskeletal:        General: Normal range of motion.     Cervical back: Normal range of motion and neck supple.     Lumbar back: Tenderness present.       Back:  Lymphadenopathy:     Cervical: No cervical adenopathy.  Skin:    General: Skin is warm and dry.     Capillary Refill: Capillary refill takes less than 2 seconds.  Neurological:     General: No focal deficit present.     Mental Status: She is alert and oriented to person, place, and time.  Psychiatric:        Mood and Affect: Mood normal.        Behavior: Behavior normal.        Thought Content: Thought content normal.        Judgment: Judgment normal.       Assessment & Plan     1. Acute right-sided low back pain without sciatica Solu-Medrol 40 mg IM injection given during today's visit.  She should continue to take Tylenol as needed and as indicated for acute pain.  Reviewed gentle stretches to help relieve low back pain and sciatica.  Written instructions were given to support discussion.  She should contact the office for persistent symptoms. - methylPREDNISolone sodium succinate (SOLU-MEDROL) 40 mg/mL injection 40 mg   Return for prn worsening or persistent symptoms.        Ronnell Freshwater, NP  [facility] at [facility] [phone] (phone) [phone] (fax)  [place]

## 2022-04-15 DIAGNOSIS — M545 Low back pain, unspecified: Secondary | ICD-10-CM | POA: Insufficient documentation

## 2022-04-16 ENCOUNTER — Ambulatory Visit: Payer: PPO | Admitting: Rehabilitation

## 2022-04-16 ENCOUNTER — Other Ambulatory Visit: Payer: Self-pay | Admitting: Physician Assistant

## 2022-04-16 ENCOUNTER — Encounter: Payer: Self-pay | Admitting: Physician Assistant

## 2022-04-16 DIAGNOSIS — F331 Major depressive disorder, recurrent, moderate: Secondary | ICD-10-CM

## 2022-04-16 DIAGNOSIS — F411 Generalized anxiety disorder: Secondary | ICD-10-CM

## 2022-04-18 ENCOUNTER — Ambulatory Visit: Payer: PPO | Admitting: Physical Therapy

## 2022-04-19 ENCOUNTER — Ambulatory Visit: Payer: PPO

## 2022-04-23 ENCOUNTER — Ambulatory Visit: Payer: PPO | Attending: Hematology and Oncology | Admitting: Physical Therapy

## 2022-04-23 ENCOUNTER — Encounter: Payer: Self-pay | Admitting: Physical Therapy

## 2022-04-23 DIAGNOSIS — I890 Lymphedema, not elsewhere classified: Secondary | ICD-10-CM | POA: Diagnosis not present

## 2022-04-23 DIAGNOSIS — M62838 Other muscle spasm: Secondary | ICD-10-CM | POA: Diagnosis not present

## 2022-04-23 DIAGNOSIS — L599 Disorder of the skin and subcutaneous tissue related to radiation, unspecified: Secondary | ICD-10-CM | POA: Diagnosis not present

## 2022-04-23 DIAGNOSIS — Z170 Estrogen receptor positive status [ER+]: Secondary | ICD-10-CM | POA: Insufficient documentation

## 2022-04-23 DIAGNOSIS — C50812 Malignant neoplasm of overlapping sites of left female breast: Secondary | ICD-10-CM | POA: Diagnosis not present

## 2022-04-23 NOTE — Therapy (Signed)
OUTPATIENT PHYSICAL THERAPY ONCOLOGY TREATMENT  Patient Name: Dawn Booker MRN: [id number] DOB:[birth year], 66 y.o., female Today's Date: 04/23/2022   PT End of Session - 04/23/22 1405     Visit Number 4    Number of Visits 9    Date for PT Re-Evaluation 04/25/22    PT Start Time 1404    PT Stop Time 1446    PT Time Calculation (min) 42 min    Activity Tolerance Patient tolerated treatment well    Behavior During Therapy [facility] for tasks assessed/performed             Past Medical History:  Diagnosis Date   ADD (attention deficit disorder)    Anxiety    Cancer ([place])    breast cancer   Depression    GERD (gastroesophageal reflux disease)    Hiatal hernia    History of radiation therapy 08/13/2018- 09/25/2018   Left Chest wall and IM nodes/ 50 Gy in 25 fractions, Left supraclaicular fossa and PAB/ 50 Gy in 25 fractions, Left chest wall scar boost/ 10 Gy in 5 fractions.    Hyperlipemia, mixed    Intermittent palpitations    Malignant neoplasm of overlapping sites of left breast in female, estrogen receptor positive ([place]) 05/06/2018   oncologist-  dr Lindi Adie--  Invasive Lobular Cancer, CIS, Stage IB, Grade 2 (pT3,pN1a,cM0), ER+---  s/p  left mastectomy with sln dissections and right mastectomy (benign)   PONV (postoperative nausea and vomiting)    Right thyroid nodule    Shingles 12/2017   Tennis elbow    Past Surgical History:  Procedure Laterality Date   AXILLARY LYMPH NODE DISSECTION Left 07/09/2018   Procedure: COMPLETION OF LEFT AXILLARY LYMPH NODE DISSECTION;  Surgeon: [name], MD;  Location: [facility];  Service: General;  Laterality: Left;   BREAST EXCISIONAL BIOPSY Left 1995   benign   CARDIOVASCULAR STRESS TEST  06/21/2017   normal nuclear stress study w/ no ischemia/  normal LV function and wall function, nuclear stress ef 70%   ELBOW SURGERY Left child   MASTECTOMY W/ SENTINEL NODE BIOPSY Left 06/13/2018   MASTECTOMY WITH RADIOACTIVE SEED GUIDED EXCISION  AND AXILLARY SENTINEL LYMPH NODE BIOPSY Bilateral 06/13/2018   Procedure: LEFT MASTECTOMY WITH SENTINEL LYMPH NODE MAPPING AND TARGETED NODE DISSECTION AND RIGHT PROPHYLATIC MASTECTOMY;  Surgeon: Jovita Kussmaul, MD;  Location: [facility];  Service: General;  Laterality: Bilateral;   THYROIDECTOMY Left 10-31-2001   dr gerkin '@[facility]'    TRANSTHORACIC ECHOCARDIOGRAM  06/21/2017   ef 60-65%/  trivial MR (no evidence mvp)   TUBAL LIGATION  yrs ago   [employer]   Patient Active Problem List   Diagnosis Date Noted   Acute right-sided low back pain without sciatica 04/15/2022   Prediabetes 01/10/2022   Anal spasm 08/25/2020   Biliary dyskinesia 08/25/2020   Colon cancer screening 08/25/2020   Constipation 08/25/2020   Diaphragmatic hernia 08/25/2020   Flatulence, eructation and gas pain 08/25/2020   Left lower quadrant pain 08/25/2020   Nausea 43/60/6770   Periumbilical pain 34/11/5246   Insomnia 06/26/2019   Glucose intolerance (impaired glucose tolerance) 06/26/2019   Hyperlipidemia 06/26/2019   Vitamin D deficiency 06/26/2019   Osteopenia after menopause 06/26/2019   Need for vaccination 06/09/2019   H/O left mastectomy 06/09/2019   MDD (major depressive disorder), recurrent episode, moderate ([place]) 05/06/2019   GAD (generalized anxiety disorder) 05/06/2019   Insomnia due to mental condition 05/06/2019   Cancer of left female breast (  [place]) 06/13/2018   H/O bilateral mastectomy 06/13/2018   Malignant neoplasm of overlapping sites of left breast in female, estrogen receptor positive ([place]) 05/09/2018   Blurring of visual image of both eyes 12/06/2017   Postherpetic neuralgia 12/06/2017   Nasal turbinate hypertrophy 10/09/2016   Nasal congestion 08/31/2016   Deviated septum 08/31/2016   ESOPHAGEAL REFLUX 10/10/2009    PCP: Lorrene Reid, PA-C  REFERRING PROVIDER: Nicholas Lose, MD   REFERRING DIAG: C50.812,Z17.0 (ICD-10-CM) - Malignant neoplasm of overlapping sites of  left breast in female, estrogen receptor positive ([place])   THERAPY DIAG:  Other muscle spasm  Disorder of the skin and subcutaneous tissue related to radiation, unspecified  Lymphedema, not elsewhere classified  Malignant neoplasm of overlapping sites of left breast in female, estrogen receptor positive ([place])  ONSET DATE: 04/25/18  Rationale for Evaluation and Treatment Rehabilitation  SUBJECTIVE                                                                                                                                                                                           SUBJECTIVE STATEMENT: I am doing much much better. I can't complain. The only time it was slightly there I had not done my exercises.   PERTINENT HISTORY:  Patient was diagnosed on 04/25/18 with left grade II invasive lobular carci1noma breast cancer. There are 2 areas: 2.6 cm in the upper outer quadrant and 6 mm in the upper inner quadrant. Both are ER/PR positive and HER2 negative with a Ki67 of 10% and she has a positive axillary node. Bilateral mastectomy on 06/13/2018 wiht 7 nodes removed and the 10 in second surgery on 07/09/2018 She had radiation and did well.  PAIN:  Are you having pain? No has occasional stinging on L lateral trunk  PRECAUTIONS: Other: at risk for lymphedema on L side , osteoporosis    WEIGHT BEARING RESTRICTIONS No  FALLS:  Has patient fallen in last 6 months? No  LIVING ENVIRONMENT: Lives with: lives with their spouse Lives in: House/apartment Stairs: Yes; Internal: 13 steps; on right going up and External: 5 steps; can reach both Has following equipment at home: None  OCCUPATION: retired  LEISURE: pt does not exercise  HAND DOMINANCE : right   PRIOR LEVEL OF FUNCTION: Independent  PATIENT GOALS to try to get relief from discomfort in L lateral trunk   OBJECTIVE  COGNITION:  Overall cognitive status: Within functional limits for tasks assessed   PALPATION: Some  fullness/ muscle tightness in L lateral trunk and increased muscle tightness in L scapular muscles   POSTURE: rounded head and forward shoulders   UPPER EXTREMITY AROM/PROM:  WFL bilaterally    LYMPHEDEMA ASSESSMENTS:   SURGERY TYPE/DATE: 06/13/18  NUMBER OF LYMPH NODES REMOVED: 7 with surgery on 06/13/18 then another 10 during second surgery on 07/09/2018  CHEMOTHERAPY: did not receive  RADIATION:completed  HORMONE TREATMENT: unable to tolerate  INFECTIONS: none  LYMPHEDEMA ASSESSMENTS:   LANDMARK RIGHT  eval  10 cm proximal to olecranon process 28.3  Olecranon process 24  10 cm proximal to ulnar styloid process 21  Just proximal to ulnar styloid process 15  Across hand at thumb web space 18.5  At base of 2nd digit 5.8  (Blank rows = not tested)  LANDMARK LEFT  eval  10 cm proximal to olecranon process 27.5  Olecranon process 23.5  10 cm proximal to ulnar styloid process 20  Just proximal to ulnar styloid process 15.3  Across hand at thumb web space 18.1  At base of 2nd digit 5.8  (Blank rows = not tested)  TODAY'S TREATMENT  04/23/22: STM in R S/L to left lateral trunk including serratus, edge of lats and teres using cocoa butter to help decrease muscle tightness with improvement noted by end of session. Answered pt's 2 questions about her HEP. Educated pt to use a tennis ball on the wall for self massage of lateral edge of lats.   04/02/22: STM in R S/L to left lateral trunk including serratus, edge of lats and teres using cocoa butter to help decrease muscle tightness with improvement noted by end of session and then in supine assessed incision mobility with some lateral inferior stiffness and tightness so included some work here as well.  Discussed breathing, and added exercises to HEP to start per below.   03/29/22: STM in R S/L to left lateral trunk including serratus, edge of lats and teres using cocoa butter to help decrease muscle tightness with improvement noted  by end of session  03/28/22: STM in R S/L to left lateral trunk including serratus, edge of lats and teres using cocoa butter to help decrease muscle tightness  PATIENT EDUCATION:  Education details: self massage with tennis ball on wall Person educated: Patient Education method: Explanation Education comprehension: verbalized understanding   HOME EXERCISE PROGRAM: Try self massage with tennis ball on wall Access Code: [email or web address] URL: [email or web address] Date: 04/02/2022 Prepared by: Shan Levans  Exercises - Hooklying Rib Cage Breathing  - 1 x daily - 7 x weekly - 1 sets - 10 reps - Supine Lower Trapezius Strengthening  - 1 x daily - 7 x weekly - 1 sets - 5 reps - Supine Bilateral Shoulder Protraction  - 1 x daily - 7 x weekly - 1 sets - 10 reps - Supine Alternating Shoulder Flexion  - 1 x daily - 7 x weekly - 1- sets - 10 reps - no hold - Supine Chest Stretch with Elbows Bent  - 1 x daily - 7 x weekly - 1 sets - 3 reps - 30-60seconds hold - Hooklying Shoulder T  - 1 x daily - 7 x weekly - 1 sets - 10 reps - 2-3 second hold ASSESSMENT:  CLINICAL IMPRESSION: Continued with soft tissue mobilization to L lateral trunk with focus on serratus and lateral edge of lats. Pt reports that her pain is gone. She is independent in her home exercise program and is no longer having pain in her left lateral trunk. She has met all goals and is ready to d/c from skilled PT services.    OBJECTIVE IMPAIRMENTS increased edema, increased fascial restrictions, increased muscle  spasms, postural dysfunction, and pain.   ACTIVITY LIMITATIONS  reaching backwards  PARTICIPATION LIMITATIONS:  none  PERSONAL FACTORS  none  are also affecting patient's functional outcome.   REHAB POTENTIAL: Good  CLINICAL DECISION MAKING: Stable/uncomplicated  EVALUATION COMPLEXITY: Low  GOALS: Goals reviewed with patient? Yes  SHORT TERM GOALS = LONG TERM GOALS  Target date: 05/21/2022    Pt will  report a 75% improvement in pain and discomfort in L lateral trunk when reaching backwards Baseline: Goal status: MET 04/23/22- 100% improvement  2.  Pt will be independent in a home exercise program for continued stretching to decrease muscle spasms.  Baseline:  Goal status: MET 04/23/22- pt is independent with this  PLAN: PT FREQUENCY: 2x/week  PT DURATION: 4 weeks  PLANNED INTERVENTIONS: Therapeutic exercises, Therapeutic activity, Patient/Family education, Self Care, Manual lymph drainage, Compression bandaging, scar mobilization, Vasopneumatic device, and Manual therapy  PLAN FOR NEXT SESSION: D/C today  PHYSICAL THERAPY DISCHARGE SUMMARY  Visits from Start of Care: 4  Current functional level related to goals / functional outcomes: All goals met   Remaining deficits: None   Education / Equipment: HEP   Patient agrees to discharge. Patient goals were met. Patient is being discharged due to meeting the stated rehab goals.    Allyson Sabal [place], PT 04/23/2022, 2:46 PM

## 2022-04-24 ENCOUNTER — Encounter: Payer: Self-pay | Admitting: Physician Assistant

## 2022-04-25 ENCOUNTER — Ambulatory Visit: Payer: PPO | Admitting: Physical Therapy

## 2022-05-01 ENCOUNTER — Encounter: Payer: Self-pay | Admitting: Physical Therapy

## 2022-05-03 ENCOUNTER — Encounter: Payer: Self-pay | Admitting: Physical Therapy

## 2022-05-15 ENCOUNTER — Other Ambulatory Visit: Payer: PPO

## 2022-05-17 ENCOUNTER — Other Ambulatory Visit: Payer: Self-pay

## 2022-05-17 DIAGNOSIS — Z0000 Encounter for general adult medical examination without abnormal findings: Secondary | ICD-10-CM

## 2022-05-17 DIAGNOSIS — E785 Hyperlipidemia, unspecified: Secondary | ICD-10-CM

## 2022-05-17 DIAGNOSIS — E039 Hypothyroidism, unspecified: Secondary | ICD-10-CM

## 2022-05-21 ENCOUNTER — Other Ambulatory Visit: Payer: PPO

## 2022-05-21 DIAGNOSIS — E039 Hypothyroidism, unspecified: Secondary | ICD-10-CM | POA: Diagnosis not present

## 2022-05-21 DIAGNOSIS — Z0000 Encounter for general adult medical examination without abnormal findings: Secondary | ICD-10-CM | POA: Diagnosis not present

## 2022-05-21 DIAGNOSIS — E785 Hyperlipidemia, unspecified: Secondary | ICD-10-CM | POA: Diagnosis not present

## 2022-05-22 ENCOUNTER — Ambulatory Visit (INDEPENDENT_AMBULATORY_CARE_PROVIDER_SITE_OTHER): Payer: PPO | Admitting: Physician Assistant

## 2022-05-22 ENCOUNTER — Encounter: Payer: Self-pay | Admitting: Physician Assistant

## 2022-05-22 VITALS — BP 140/83 | HR 80 | Ht 65.0 in | Wt 158.0 lb

## 2022-05-22 DIAGNOSIS — E785 Hyperlipidemia, unspecified: Secondary | ICD-10-CM

## 2022-05-22 DIAGNOSIS — Z0000 Encounter for general adult medical examination without abnormal findings: Secondary | ICD-10-CM

## 2022-05-22 DIAGNOSIS — E039 Hypothyroidism, unspecified: Secondary | ICD-10-CM | POA: Diagnosis not present

## 2022-05-22 LAB — COMPREHENSIVE METABOLIC PANEL
ALT: 23 IU/L (ref 0–32)
AST: 17 IU/L (ref 0–40)
Albumin/Globulin Ratio: 2.5 — ABNORMAL HIGH (ref 1.2–2.2)
Albumin: 4.7 g/dL (ref 3.9–4.9)
Alkaline Phosphatase: 58 IU/L (ref 44–121)
BUN/Creatinine Ratio: 12 (ref 12–28)
BUN: 12 mg/dL (ref 8–27)
Bilirubin Total: 0.5 mg/dL (ref 0.0–1.2)
CO2: 23 mmol/L (ref 20–29)
Calcium: 9.5 mg/dL (ref 8.7–10.3)
Chloride: 102 mmol/L (ref 96–106)
Creatinine, Ser: 0.98 mg/dL (ref 0.57–1.00)
Globulin, Total: 1.9 g/dL (ref 1.5–4.5)
Glucose: 110 mg/dL — ABNORMAL HIGH (ref 70–99)
Potassium: 4.2 mmol/L (ref 3.5–5.2)
Sodium: 141 mmol/L (ref 134–144)
Total Protein: 6.6 g/dL (ref 6.0–8.5)
eGFR: 64 mL/min/{1.73_m2} (ref >59)

## 2022-05-22 LAB — LIPID PANEL
Chol/HDL Ratio: 2.9 ratio (ref 0.0–4.4)
Cholesterol, Total: 160 mg/dL (ref 100–199)
HDL: 55 mg/dL (ref >39)
LDL Chol Calc (NIH): 86 mg/dL (ref 0–99)
Triglycerides: 103 mg/dL (ref 0–149)
VLDL Cholesterol Cal: 19 mg/dL (ref 5–40)

## 2022-05-22 LAB — CBC WITH DIFFERENTIAL/PLATELET
Basophils Absolute: 0 10*3/uL (ref 0.0–0.2)
Basos: 1 %
EOS (ABSOLUTE): 0.1 10*3/uL (ref 0.0–0.4)
Eos: 1 %
Hematocrit: 43 % (ref 34.0–46.6)
Hemoglobin: 14.7 g/dL (ref 11.1–15.9)
Immature Grans (Abs): 0 10*3/uL (ref 0.0–0.1)
Immature Granulocytes: 0 %
Lymphocytes Absolute: 1.7 10*3/uL (ref 0.7–3.1)
Lymphs: 36 %
MCH: 31.8 pg (ref 26.6–33.0)
MCHC: 34.2 g/dL (ref 31.5–35.7)
MCV: 93 fL (ref 79–97)
Monocytes Absolute: 0.3 10*3/uL (ref 0.1–0.9)
Monocytes: 7 %
Neutrophils Absolute: 2.5 10*3/uL (ref 1.4–7.0)
Neutrophils: 55 %
Platelets: 195 10*3/uL (ref 150–450)
RBC: 4.62 x10E6/uL (ref 3.77–5.28)
RDW: 12.5 % (ref 11.7–15.4)
WBC: 4.6 10*3/uL (ref 3.4–10.8)

## 2022-05-22 LAB — HEMOGLOBIN A1C
Est. average glucose Bld gHb Est-mCnc: 126 mg/dL
Hgb A1c MFr Bld: 6 % — ABNORMAL HIGH (ref 4.8–5.6)

## 2022-05-22 LAB — TSH: TSH: 4.09 u[IU]/mL (ref 0.450–4.500)

## 2022-05-22 NOTE — Patient Instructions (Signed)
Preventive Care 65 Years and Older, Female Preventive care refers to lifestyle choices and visits with your health care provider that can promote health and wellness. Preventive care visits are also called wellness exams. What can I expect for my preventive care visit? Counseling Your health care provider may ask you questions about your: Medical history, including: Past medical problems. Family medical history. Pregnancy and menstrual history. History of falls. Current health, including: Memory and ability to understand (cognition). Emotional well-being. Home life and relationship well-being. Sexual activity and sexual health. Lifestyle, including: Alcohol, nicotine or tobacco, and drug use. Access to firearms. Diet, exercise, and sleep habits. Work and work environment. Sunscreen use. Safety issues such as seatbelt and bike helmet use. Physical exam Your health care provider will check your: Height and weight. These may be used to calculate your BMI (body mass index). BMI is a measurement that tells if you are at a healthy weight. Waist circumference. This measures the distance around your waistline. This measurement also tells if you are at a healthy weight and may help predict your risk of certain diseases, such as type 2 diabetes and high blood pressure. Heart rate and blood pressure. Body temperature. Skin for abnormal spots. What immunizations do I need?  Vaccines are usually given at various ages, according to a schedule. Your health care provider will recommend vaccines for you based on your age, medical history, and lifestyle or other factors, such as travel or where you work. What tests do I need? Screening Your health care provider may recommend screening tests for certain conditions. This may include: Lipid and cholesterol levels. Hepatitis C test. Hepatitis B test. HIV (human immunodeficiency virus) test. STI (sexually transmitted infection) testing, if you are at  risk. Lung cancer screening. Colorectal cancer screening. Diabetes screening. This is done by checking your blood sugar (glucose) after you have not eaten for a while (fasting). Mammogram. Talk with your health care provider about how often you should have regular mammograms. BRCA-related cancer screening. This may be done if you have a family history of breast, ovarian, tubal, or peritoneal cancers. Bone density scan. This is done to screen for osteoporosis. Talk with your health care provider about your test results, treatment options, and if necessary, the need for more tests. Follow these instructions at home: Eating and drinking  Eat a diet that includes fresh fruits and vegetables, whole grains, lean protein, and low-fat dairy products. Limit your intake of foods with high amounts of sugar, saturated fats, and salt. Take vitamin and mineral supplements as recommended by your health care provider. Do not drink alcohol if your health care provider tells you not to drink. If you drink alcohol: Limit how much you have to 0-1 drink a day. Know how much alcohol is in your drink. In the [place] one drink equals one 12 oz bottle of beer (355 mL), one 5 oz glass of wine (148 mL), or one 1 oz glass of hard liquor (44 mL). Lifestyle Brush your teeth every morning and night with fluoride toothpaste. Floss one time each day. Exercise for at least 30 minutes 5 or more days each week. Do not use any products that contain nicotine or tobacco. These products include cigarettes, chewing tobacco, and vaping devices, such as e-cigarettes. If you need help quitting, ask your health care provider. Do not use drugs. If you are sexually active, practice safe sex. Use a condom or other form of protection in order to prevent STIs. Take aspirin only as told by   your health care provider. Make sure that you understand how much to take and what form to take. Work with your health care provider to find out whether it  is safe and beneficial for you to take aspirin daily. Ask your health care provider if you need to take a cholesterol-lowering medicine (statin). Find healthy ways to manage stress, such as: Meditation, yoga, or listening to music. Journaling. Talking to a trusted person. Spending time with friends and family. Minimize exposure to UV radiation to reduce your risk of skin cancer. Safety Always wear your seat belt while driving or riding in a vehicle. Do not drive: If you have been drinking alcohol. Do not ride with someone who has been drinking. When you are tired or distracted. While texting. If you have been using any mind-altering substances or drugs. Wear a helmet and other protective equipment during sports activities. If you have firearms in your house, make sure you follow all gun safety procedures. What's next? Visit your health care provider once a year for an annual wellness visit. Ask your health care provider how often you should have your eyes and teeth checked. Stay up to date on all vaccines. This information is not intended to replace advice given to you by your health care provider. Make sure you discuss any questions you have with your health care provider. Document Revised: 02/15/2021 Document Reviewed: 02/15/2021 Else[employer] Patient Education  2023 [employer]  

## 2022-05-22 NOTE — Progress Notes (Signed)
Complete physical exam   Patient: Dawn Booker   DOB: 1955/12/13   66 y.o. Female  MRN: [id number] Visit Date: 05/22/2022   Chief Complaint  Patient presents with   Annual Exam    Patient presents today annual exam. She had fasting labs yesterday.   Subjective    Dawn Booker is a 66 y.o. female who presents today for a complete physical exam.  She reports consuming a low fat diet.  Stretches and light-moderate exercise.  She generally feels fairly well. She does not have additional problems to discuss today.     Past Medical History:  Diagnosis Date   ADD (attention deficit disorder)    Anxiety    Cancer ([place])    breast cancer   Depression    GERD (gastroesophageal reflux disease)    Hiatal hernia    History of radiation therapy 08/13/2018- 09/25/2018   Left Chest wall and IM nodes/ 50 Gy in 25 fractions, Left supraclaicular fossa and PAB/ 50 Gy in 25 fractions, Left chest wall scar boost/ 10 Gy in 5 fractions.    Hyperlipemia, mixed    Intermittent palpitations    Malignant neoplasm of overlapping sites of left breast in female, estrogen receptor positive ([place]) 05/06/2018   oncologist-  dr Lindi Adie--  Invasive Lobular Cancer, CIS, Stage IB, Grade 2 (pT3,pN1a,cM0), ER+---  s/p  left mastectomy with sln dissections and right mastectomy (benign)   PONV (postoperative nausea and vomiting)    Right thyroid nodule    Shingles 12/2017   Tennis elbow    Past Surgical History:  Procedure Laterality Date   AXILLARY LYMPH NODE DISSECTION Left 07/09/2018   Procedure: COMPLETION OF LEFT AXILLARY LYMPH NODE DISSECTION;  Surgeon: [name], MD;  Location: [facility];  Service: General;  Laterality: Left;   BREAST EXCISIONAL BIOPSY Left 1995   benign   CARDIOVASCULAR STRESS TEST  06/21/2017   normal nuclear stress study w/ no ischemia/  normal LV function and wall function, nuclear stress ef 70%   ELBOW SURGERY Left child   MASTECTOMY W/ SENTINEL NODE BIOPSY Left 06/13/2018    MASTECTOMY WITH RADIOACTIVE SEED GUIDED EXCISION AND AXILLARY SENTINEL LYMPH NODE BIOPSY Bilateral 06/13/2018   Procedure: LEFT MASTECTOMY WITH SENTINEL LYMPH NODE MAPPING AND TARGETED NODE DISSECTION AND RIGHT PROPHYLATIC MASTECTOMY;  Surgeon: Jovita Kussmaul, MD;  Location: [place];  Service: General;  Laterality: Bilateral;   THYROIDECTOMY Left 10-31-2001   dr gerkin _0    TRANSTHORACIC ECHOCARDIOGRAM  06/21/2017   ef 60-65%/  trivial MR (no evidence mvp)   TUBAL LIGATION  yrs ago   VAGINAL HYSTERECTOMY  1990s   Social History   Socioeconomic History   Marital status: Married    Spouse name: Not on file   Number of children: 2   Years of education: 12   Highest education level: High school graduate  Occupational History   Not on file  Tobacco Use   Smoking status: Former    Packs/day: 0.25    Years: 0.00    Total pack years: 0.00    Types: Cigarettes    Quit date: 12/05/2017    Years since quitting: 4.4   Smokeless tobacco: Never  Vaping Use   Vaping Use: Never used  Substance and Sexual Activity   Alcohol use: Not Currently   Drug use: No   Sexual activity: Yes    Partners: Male    Birth control/protection: None  Other Topics Concern   Not on file  Social History Narrative   Lives at home with husband.   Right-handed.   1 cup coffee per day, occasional soda or tea.   Social Determinants of Health   Financial Resource Strain: Not on file  Food Insecurity: Not on file  Transportation Needs: No Transportation Needs (08/06/2018)   PRAPARE - Hydrologist (Medical): No    Lack of Transportation (Non-Medical): No  Physical Activity: Not on file  Stress: Not on file  Social Connections: Not on file  Intimate Partner Violence: Not At Risk (10/31/2018)   Humiliation, Afraid, Rape, and Kick questionnaire    Fear of Current or Ex-Partner: No    Emotionally Abused: No    Physically Abused: No    Sexually Abused: No      Medications: Outpatient Medications Prior to Visit  Medication Sig   acetaminophen (TYLENOL) 500 MG tablet Take 1,000 mg by mouth at bedtime.   buPROPion (WELLBUTRIN XL) 150 MG 24 hr tablet TAKE 1 TABLET BY MOUTH DAILY   Cholecalciferol (VITAMIN D) 50 MCG (2000 UT) CAPS Take by mouth. 1 capsule daily   Coenzyme Q10 (COQ10) 100 MG CAPS Take 1 capsule by mouth daily.   Cyanocobalamin (VITAMIN B12) 500 MCG TABS Take by mouth. 1 tablet by mouth daily   diphenhydrAMINE (BENADRYL) 25 mg capsule Take 25 mg by mouth at bedtime.   folic acid (FOLVITE) 694 MCG tablet Take 400 mcg by mouth daily.   Magnesium 400 MG TABS Take 400 mg by mouth.   omeprazole (PRILOSEC) 40 MG capsule TAKE 1 CAPSULE BY MOUTH DAILY   OVER THE COUNTER MEDICATION 1 tablet daily. Vegan Calcium 1000   PROLIA 60 MG/ML SOSY injection Inject into the skin once.   rosuvastatin (CRESTOR) 10 MG tablet TAKE 1 TABLET BY MOUTH DAILY AT BEDTIME   temazepam (RESTORIL) 30 MG capsule TAKE 1 CAPSULE BY MOUTH EACH NIGHT AT BEDTIME   Calcium Carbonate (CALCIUM 500 PO) Take 1,500 mg by mouth daily. Vegan   No facility-administered medications prior to visit.    Review of Systems Review of Systems:  A fourteen system review of systems was performed and found to be positive as per HPI.  Last CBC Lab Results  Component Value Date   WBC 4.6 05/21/2022   HGB 14.7 05/21/2022   HCT 43.0 05/21/2022   MCV 93 05/21/2022   MCH 31.8 05/21/2022   RDW 12.5 05/21/2022   PLT 195 50/38/8828   Last metabolic panel Lab Results  Component Value Date   GLUCOSE 110 (H) 05/21/2022   NA 141 05/21/2022   K 4.2 05/21/2022   CL 102 05/21/2022   CO2 23 05/21/2022   BUN 12 05/21/2022   CREATININE 0.98 05/21/2022   EGFR 64 05/21/2022   CALCIUM 9.5 05/21/2022   PROT 6.6 05/21/2022   ALBUMIN 4.7 05/21/2022   LABGLOB 1.9 05/21/2022   AGRATIO 2.5 (H) 05/21/2022   BILITOT 0.5 05/21/2022   ALKPHOS 58 05/21/2022   AST 17 05/21/2022   ALT 23  05/21/2022   ANIONGAP 9 04/29/2019   Last lipids Lab Results  Component Value Date   CHOL 160 05/21/2022   HDL 55 05/21/2022   LDLCALC 86 05/21/2022   TRIG 103 05/21/2022   CHOLHDL 2.9 05/21/2022   Last hemoglobin A1c Lab Results  Component Value Date   HGBA1C 6.0 (H) 05/21/2022   Last thyroid functions Lab Results  Component Value Date   TSH 4.090 05/21/2022   T3TOTAL 114 06/23/2019  Last vitamin D Lab Results  Component Value Date   VD25OH 78.8 01/10/2021      Objective     BP (!) 140/83   Pulse 80   Ht _0  (1.651 m)   Wt 158 lb (71.7 kg)   SpO2 98%   BMI 26.29 kg/m  BP Readings from Last 3 Encounters:  05/22/22 (!) 140/83  04/05/22 127/85  03/23/22 133/82   Wt Readings from Last 3 Encounters:  05/22/22 158 lb (71.7 kg)  04/05/22 161 lb (73 kg)  03/23/22 161 lb (73 kg)      Physical Exam   General Appearance:       Alert, cooperative, in no acute distress, appears stated age   Head:    Normocephalic, without obvious abnormality, atraumatic  Eyes:    PERRL, conjunctiva/corneas clear, EOM's intact, fundi    benign, both eyes  Ears:    Normal TM's and external ear canals, both ears  Nose:   Nares normal, septum midline, mucosa normal, no drainage    or sinus tenderness  Throat:   Lips, mucosa, and tongue normal; teeth and gums normal  Neck:   Supple, symmetrical, trachea midline, no adenopathy;    thyroid:  no tenderness/nodules; no JVD  Back:     Symmetric, no curvature, ROM normal, no CVA tenderness  Lungs:     Clear to auscultation bilaterally, respirations unlabored  Chest Wall:    No tenderness or deformity   Heart:    Normal heart rate. Normal rhythm. No murmurs, rubs, or gallops.   Breast Exam:    deferred  Abdomen:     Soft, non-tender, bowel sounds active all four quadrants,    no masses, no organomegaly  Pelvic:    deferred  Extremities:   All extremities are intact. No cyanosis or edema  Pulses:   2+ and symmetric all extremities   Skin:   Skin color, texture, turgor normal, no suspicious rashes or lesions  Lymph nodes:   Cervical and supraclavicular nodes normal  Neurologic:   CNII-XII grossly intact.     Last depression screening scores    05/22/2022    2:00 PM 03/23/2022    9:58 AM 01/10/2022    1:25 PM  PHQ 2/9 Scores  PHQ - 2 Score _1 PHQ- 9 Score _2 Last fall risk screening    05/22/2022    1:59 PM  Fall Risk   Falls in the past year? 0  Injury with Fall? 0  Risk for fall due to : No Fall Risks  Follow up Falls evaluation completed;Education provided     No results found for any visits on 05/22/22.  Assessment & Plan    Routine Health Maintenance and Physical Exam  Exercise Activities and Dietary recommendations -Discussed heart healthy diet low in fat and carbohydrates. Recommend moderate exercise 150 mins/wk.  Immunization History  Administered Date(s) Administered   Influenza,inj,Quad PF,6+ Mos 06/09/2019, 06/08/2020, 05/17/2021   Moderna Sars-Covid-2 Vaccination 11/20/2019, 12/22/2019, 08/11/2020   PNEUMOCOCCAL CONJUGATE-20 05/30/2021   Tdap 06/14/2014   Zoster Recombinat (Shingrix) 06/26/2019, 09/01/2019    Health Maintenance  Topic Date Due   HIV Screening  Never done   Hepatitis C Screening  Never done   COVID-19 Vaccine (4 - Moderna risk series) 10/06/2020   INFLUENZA VACCINE  04/03/2022   TETANUS/TDAP  06/14/2024   COLONOSCOPY (Pts 45-2yr Insurance coverage will need to be confirmed)  07/23/2029   Pneumonia Vaccine 66 Years old  Completed   DEXA SCAN  Completed   Zoster Vaccines- Shingrix  Completed   HPV VACCINES  Aged Out   MAMMOGRAM  Discontinued   PAP SMEAR-Modifier  Discontinued    Discussed health benefits of physical activity, and encouraged her to engage in regular exercise appropriate for her age and condition.  Problem List Items Addressed This Visit       Other   Hyperlipidemia   Other Visit Diagnoses     Healthcare maintenance    -   Primary   Acquired hypothyroidism          Discussed with patient most recent lab results which are essentially within normal limits or stable from prior. A1c increased from 5.8 to 6.0 which is possibly secondary to corticosteroid therapy. Recommend repeating A1c in 3-4 months. TSH is normal but higher than baseline so will repeat in 2 weeks as well as collect free T4 and T3, pending lab results will consider medication therapy if indicated. UTD bone density, Tdap and colonoscopy.   Return in about 4 months (around 09/21/2022) for Mood, thyroid, HLD; lab visit in 2 weeks for TSH, free T4,T3.       Lorrene Reid, PA-C  [facility] at [facility] [phone] (phone) [phone] (fax)  [place]

## 2022-05-28 DIAGNOSIS — L292 Pruritus vulvae: Secondary | ICD-10-CM | POA: Diagnosis not present

## 2022-06-04 ENCOUNTER — Other Ambulatory Visit: Payer: Self-pay

## 2022-06-04 DIAGNOSIS — E039 Hypothyroidism, unspecified: Secondary | ICD-10-CM

## 2022-06-05 ENCOUNTER — Other Ambulatory Visit (INDEPENDENT_AMBULATORY_CARE_PROVIDER_SITE_OTHER): Payer: PPO

## 2022-06-05 DIAGNOSIS — R102 Pelvic and perineal pain: Secondary | ICD-10-CM

## 2022-06-05 DIAGNOSIS — E039 Hypothyroidism, unspecified: Secondary | ICD-10-CM

## 2022-06-05 DIAGNOSIS — Z23 Encounter for immunization: Secondary | ICD-10-CM

## 2022-06-05 LAB — POCT URINALYSIS DIP (CLINITEK)
Bilirubin, UA: NEGATIVE
Blood, UA: NEGATIVE
Glucose, UA: NEGATIVE mg/dL
Ketones, POC UA: NEGATIVE mg/dL
Leukocytes, UA: NEGATIVE
Nitrite, UA: NEGATIVE
POC PROTEIN,UA: NEGATIVE
Spec Grav, UA: 1.015
Urobilinogen, UA: 0.2 U/dL
pH, UA: 7

## 2022-06-05 NOTE — Progress Notes (Signed)
p 

## 2022-06-05 NOTE — Addendum Note (Signed)
Addended by: Vivia Birmingham on: 06/05/2022 11:29 AM   Modules accepted: Orders

## 2022-06-06 LAB — T4, FREE: Free T4: 1.14 ng/dL (ref 0.82–1.77)

## 2022-06-06 LAB — TSH: TSH: 2.69 u[IU]/mL (ref 0.450–4.500)

## 2022-06-06 LAB — T3: T3, Total: 88 ng/dL (ref 71–180)

## 2022-06-07 LAB — URINE CULTURE: Organism ID, Bacteria: NO GROWTH

## 2022-06-11 ENCOUNTER — Other Ambulatory Visit: Payer: Self-pay | Admitting: Physician Assistant

## 2022-06-11 DIAGNOSIS — F411 Generalized anxiety disorder: Secondary | ICD-10-CM

## 2022-06-11 DIAGNOSIS — F331 Major depressive disorder, recurrent, moderate: Secondary | ICD-10-CM

## 2022-06-11 DIAGNOSIS — F5105 Insomnia due to other mental disorder: Secondary | ICD-10-CM

## 2022-07-03 ENCOUNTER — Ambulatory Visit: Payer: PPO | Admitting: Nurse Practitioner

## 2022-07-05 ENCOUNTER — Other Ambulatory Visit: Payer: Self-pay | Admitting: Physician Assistant

## 2022-07-09 ENCOUNTER — Other Ambulatory Visit: Payer: Self-pay

## 2022-07-09 ENCOUNTER — Inpatient Hospital Stay: Payer: PPO | Attending: Adult Health | Admitting: Adult Health

## 2022-07-09 ENCOUNTER — Ambulatory Visit: Payer: PPO | Admitting: Nurse Practitioner

## 2022-07-09 ENCOUNTER — Inpatient Hospital Stay: Payer: PPO

## 2022-07-09 ENCOUNTER — Telehealth: Payer: Self-pay

## 2022-07-09 ENCOUNTER — Telehealth: Payer: Self-pay | Admitting: *Deleted

## 2022-07-09 ENCOUNTER — Encounter: Payer: Self-pay | Admitting: Adult Health

## 2022-07-09 VITALS — BP 155/107 | HR 91 | Temp 97.9°F | Resp 18 | Ht 65.0 in | Wt 159.4 lb

## 2022-07-09 DIAGNOSIS — Z923 Personal history of irradiation: Secondary | ICD-10-CM | POA: Diagnosis not present

## 2022-07-09 DIAGNOSIS — Z170 Estrogen receptor positive status [ER+]: Secondary | ICD-10-CM

## 2022-07-09 DIAGNOSIS — C50812 Malignant neoplasm of overlapping sites of left female breast: Secondary | ICD-10-CM | POA: Insufficient documentation

## 2022-07-09 DIAGNOSIS — Z79811 Long term (current) use of aromatase inhibitors: Secondary | ICD-10-CM | POA: Diagnosis not present

## 2022-07-09 DIAGNOSIS — R102 Pelvic and perineal pain: Secondary | ICD-10-CM | POA: Insufficient documentation

## 2022-07-09 DIAGNOSIS — Z9013 Acquired absence of bilateral breasts and nipples: Secondary | ICD-10-CM | POA: Insufficient documentation

## 2022-07-09 LAB — CBC WITH DIFFERENTIAL (CANCER CENTER ONLY)
Abs Immature Granulocytes: 0.01 10*3/uL (ref 0.00–0.07)
Basophils Absolute: 0 10*3/uL (ref 0.0–0.1)
Basophils Relative: 1 %
Eosinophils Absolute: 0.1 10*3/uL (ref 0.0–0.5)
Eosinophils Relative: 1 %
HCT: 42.9 % (ref 36.0–46.0)
Hemoglobin: 14.6 g/dL (ref 12.0–15.0)
Immature Granulocytes: 0 %
Lymphocytes Relative: 30 %
Lymphs Abs: 2 10*3/uL (ref 0.7–4.0)
MCH: 31.7 pg (ref 26.0–34.0)
MCHC: 34 g/dL (ref 30.0–36.0)
MCV: 93.3 fL (ref 80.0–100.0)
Monocytes Absolute: 0.4 10*3/uL (ref 0.1–1.0)
Monocytes Relative: 6 %
Neutro Abs: 4.1 10*3/uL (ref 1.7–7.7)
Neutrophils Relative %: 62 %
Platelet Count: 221 10*3/uL (ref 150–400)
RBC: 4.6 MIL/uL (ref 3.87–5.11)
RDW: 12.8 % (ref 11.5–15.5)
WBC Count: 6.6 10*3/uL (ref 4.0–10.5)
nRBC: 0 % (ref 0.0–0.2)

## 2022-07-09 LAB — CMP (CANCER CENTER ONLY)
ALT: 19 U/L (ref 0–44)
AST: 16 U/L (ref 15–41)
Albumin: 4.6 g/dL (ref 3.5–5.0)
Alkaline Phosphatase: 57 U/L (ref 38–126)
Anion gap: 7 (ref 5–15)
BUN: 12 mg/dL (ref 8–23)
CO2: 29 mmol/L (ref 22–32)
Calcium: 9.6 mg/dL (ref 8.9–10.3)
Chloride: 104 mmol/L (ref 98–111)
Creatinine: 0.84 mg/dL (ref 0.44–1.00)
GFR, Estimated: >60 mL/min (ref >60)
Glucose, Bld: 98 mg/dL (ref 70–99)
Potassium: 3.9 mmol/L (ref 3.5–5.1)
Sodium: 140 mmol/L (ref 135–145)
Total Bilirubin: 0.7 mg/dL (ref 0.3–1.2)
Total Protein: 7.4 g/dL (ref 6.5–8.1)

## 2022-07-09 NOTE — Assessment & Plan Note (Signed)
Dawn Booker is a 66 year old woman with history of stage Ib estrogen positive breast cancer diagnosed in September 2019 status post bilateral mastectomies, adjuvant radiation, and did not undergo antiestrogen therapy.  Dawn Booker has had this interesting left lower quadrant abdominal discomfort for several years however it is at its height right now.  The left ovary tissue that was noted on imaging last year has been worrisome to her as she knows that it is abnormal to have things on your ovaries after you have gone through menopause.  She has undergone GI work-up with Dr. Collene Mares including colonoscopy which did not show diverticulosis or an etiology for this pain.  I ordered CT scan of the abdomen and pelvis with contrast both oral and IV to evaluate for any peritoneal metastasis of her cancer or anything going on in that left ovary in particular the area where the indeterminate tissue was noted, or any potential GI source such as diverticulosis/diverticulitis.  Sandy and I will touch base in 1 week to have a phone visit to review these results and next steps.

## 2022-07-09 NOTE — Progress Notes (Signed)
[facility] Follow up:    Lorrene Reid, PA-C [place] [address]   DIAGNOSIS:  Cancer Staging  Malignant neoplasm of overlapping sites of left breast in female, estrogen receptor positive ([place]) Staging form: Breast, AJCC 8th Edition - Clinical stage from 05/21/2018: Stage IIA (cT2, cN1, cM0, G2, ER+, PR+, HER2-) - Unsigned Histologic grading system: 3 grade system Laterality: Left Stage used in treatment planning: Yes National guidelines used in treatment planning: Yes Type of national guideline used in treatment planning: NCCN - Pathologic: Stage IB (pT3, pN1a, cM0, G2, ER+, PR+, HER2-) - Signed by Nicholas Lose, MD on 06/24/2018 Neoadjuvant therapy: No Multigene prognostic tests performed: MammaPrint Histologic grading system: 3 grade system Laterality: Left Tumor size (mm): 54   SUMMARY OF ONCOLOGIC HISTORY: Oncology History  Malignant neoplasm of overlapping sites of left breast in female, estrogen receptor positive ([place])  07/2015 Miscellaneous   Genetics negative.  Genes tested: APC, ATM, BARD1, BMPR1A, BRCA1, BRCA2, BRIP1, CDH1, CDK4, CDKN2A, CHEK2, EPCAM, GREM1, MLH1, MSH2, MSH6, MUTYH, PALB2, PMS2, POLD1, POLE, PTEN, RAD51C, RAD51D, RET, SMAD4, STK11, and TP53.      05/06/2018 Initial Diagnosis   Screening detected left breast mass anteriorly 1030 to 11 o'clock position 2 masses 6 mm and 7 mm, 12:30 position 2.6 cm both of these masses biopsy-proven grade 2 invasive lobular cancer ER 100%, PR 10%, Ki-67 15%, HER-2 negative 1+ by IHC, lymph node positive for malignancy T2N1 stage IIa clinical stage   05/21/2018 Cancer Staging   Staging form: Breast, AJCC 8th Edition - Pathologic: Stage IB (pT3, pN1a, cM0, G2, ER+, PR+, HER2-) - Signed by Nicholas Lose, MD on 06/24/2018   06/13/2018 Surgery   Bilateral mastectomies: Left: ILC, grade 2, 5.4 cm, LCIS, margins negative, lymphovascular invasion present, 2/7 lymph nodes  positive with extranodal extension (1 additional lymph node had isolated tumor cells), right mastectomy benign; T3N1a ER 100%, PR 10%, HER-2 -1+, Ki-67 10 to 15%, stage Ib   08/06/2018 - 09/25/2018 Radiation Therapy   Adjuvant XRT   10/09/2018 -  Anti-estrogen oral therapy   Anastrozole 48m daily, plan for 7 years; switched to tamoxifen 11/07/18 for bone health; opted to forego all antiestrogen therapy     CURRENT THERAPY: observation  INTERVAL HISTORY: SLORYN HAACKE66y.o. female returns for f/u and evaluation of pain in her left pelvis.  She notes that this has been going on chronically for the past 3-4 years however it has been worsening over the past 1-2 months.  She notes the pressure and pain is intermittent and, and the fullness is constant.  She has undergone   Sandi has a bowel movement every day.  She is taking Magnesium in the evenings and doesn't experience constipation.  She denies diarrhea.  She has no dysuria and was tested recently for UTI which was negative.      Patient Active Problem List   Diagnosis Date Noted   Acute right-sided low back pain without sciatica 04/15/2022   Prediabetes 01/10/2022   Anal spasm 08/25/2020   Biliary dyskinesia 08/25/2020   Colon cancer screening 08/25/2020   Constipation 08/25/2020   Diaphragmatic hernia 08/25/2020   Flatulence, eructation and gas pain 08/25/2020   Left lower quadrant pain 08/25/2020   Nausea 137/12/8887  Periumbilical pain 116/94/5038  Insomnia 06/26/2019   Glucose intolerance (impaired glucose tolerance) 06/26/2019   Hyperlipidemia 06/26/2019   Vitamin D deficiency 06/26/2019   Osteopenia after menopause 06/26/2019  Need for vaccination 06/09/2019   H/O left mastectomy 06/09/2019   MDD (major depressive disorder), recurrent episode, moderate ([place]) 05/06/2019   GAD (generalized anxiety disorder) 05/06/2019   Insomnia due to mental condition 05/06/2019   Cancer of left female breast ([place]) 06/13/2018   H/O  bilateral mastectomy 06/13/2018   Malignant neoplasm of overlapping sites of left breast in female, estrogen receptor positive ([place]) 05/09/2018   Blurring of visual image of both eyes 12/06/2017   Postherpetic neuralgia 12/06/2017   Nasal turbinate hypertrophy 10/09/2016   Nasal congestion 08/31/2016   Deviated septum 08/31/2016   ESOPHAGEAL REFLUX 10/10/2009    is allergic to lamictal [lamotrigine], cymbalta [duloxetine hcl], hydrocodone, mobic [meloxicam], sulfa antibiotics, brintellix [vortioxetine], corticosteroids, morphine, other, prednisone, prozac [fluoxetine hcl], and zoloft [sertraline].  MEDICAL HISTORY: Past Medical History:  Diagnosis Date   ADD (attention deficit disorder)    Anxiety    Cancer ([place])    breast cancer   Depression    GERD (gastroesophageal reflux disease)    Hiatal hernia    History of radiation therapy 08/13/2018- 09/25/2018   Left Chest wall and IM nodes/ 50 Gy in 25 fractions, Left supraclaicular fossa and PAB/ 50 Gy in 25 fractions, Left chest wall scar boost/ 10 Gy in 5 fractions.    Hyperlipemia, mixed    Intermittent palpitations    Malignant neoplasm of overlapping sites of left breast in female, estrogen receptor positive ([place]) 05/06/2018   oncologist-  dr Lindi Adie--  Invasive Lobular Cancer, CIS, Stage IB, Grade 2 (pT3,pN1a,cM0), ER+---  s/p  left mastectomy with sln dissections and right mastectomy (benign)   PONV (postoperative nausea and vomiting)    Right thyroid nodule    Shingles 12/2017   Tennis elbow     SURGICAL HISTORY: Past Surgical History:  Procedure Laterality Date   AXILLARY LYMPH NODE DISSECTION Left 07/09/2018   Procedure: COMPLETION OF LEFT AXILLARY LYMPH NODE DISSECTION;  Surgeon: [name], MD;  Location: [facility];  Service: General;  Laterality: Left;   BREAST EXCISIONAL BIOPSY Left 1995   benign   CARDIOVASCULAR STRESS TEST  06/21/2017   normal nuclear stress study w/ no ischemia/  normal LV function and wall  function, nuclear stress ef 70%   ELBOW SURGERY Left child   MASTECTOMY W/ SENTINEL NODE BIOPSY Left 06/13/2018   MASTECTOMY WITH RADIOACTIVE SEED GUIDED EXCISION AND AXILLARY SENTINEL LYMPH NODE BIOPSY Bilateral 06/13/2018   Procedure: LEFT MASTECTOMY WITH SENTINEL LYMPH NODE MAPPING AND TARGETED NODE DISSECTION AND RIGHT PROPHYLATIC MASTECTOMY;  Surgeon: Jovita Kussmaul, MD;  Location: [facility];  Service: General;  Laterality: Bilateral;   THYROIDECTOMY Left 10-31-2001   dr gerkin _0    TRANSTHORACIC ECHOCARDIOGRAM  06/21/2017   ef 60-65%/  trivial MR (no evidence mvp)   TUBAL LIGATION  yrs ago   VAGINAL HYSTERECTOMY  1990s    SOCIAL HISTORY: Social History   Socioeconomic History   Marital status: Married    Spouse name: Not on file   Number of children: 2   Years of education: 12   Highest education level: High school graduate  Occupational History   Not on file  Tobacco Use   Smoking status: Former    Packs/day: 0.25    Years: 0.00    Total pack years: 0.00    Types: Cigarettes    Quit date: 12/05/2017    Years since quitting: 4.5   Smokeless tobacco: Never  Vaping Use   Vaping Use: Never used  Substance and Sexual  Activity   Alcohol use: Not Currently   Drug use: No   Sexual activity: Yes    Partners: Male    Birth control/protection: None  Other Topics Concern   Not on file  Social History Narrative   Lives at home with husband.   Right-handed.   1 cup coffee per day, occasional soda or tea.   Social Determinants of Health   Financial Resource Strain: Not on file  Food Insecurity: Not on file  Transportation Needs: No Transportation Needs (08/06/2018)   PRAPARE - Hydrologist (Medical): No    Lack of Transportation (Non-Medical): No  Physical Activity: Not on file  Stress: Not on file  Social Connections: Not on file  Intimate Partner Violence: Not At Risk (10/31/2018)   Humiliation, Afraid, Rape, and Kick questionnaire    Fear  of Current or Ex-Partner: No    Emotionally Abused: No    Physically Abused: No    Sexually Abused: No    FAMILY HISTORY: Family History  Problem Relation Age of Onset   Hypertension Mother    Heart attack Mother    Breast cancer Mother    Bipolar disorder Father    Breast cancer Maternal Grandmother    Breast cancer Cousin     Review of Systems  Constitutional:  Negative for appetite change, chills, fatigue, fever and unexpected weight change.  HENT:   Negative for hearing loss, lump/mass and trouble swallowing.   Eyes:  Negative for eye problems and icterus.  Respiratory:  Negative for chest tightness, cough and shortness of breath.   Cardiovascular:  Negative for chest pain, leg swelling and palpitations.  Gastrointestinal:  Negative for abdominal distention, abdominal pain, constipation, diarrhea, nausea and vomiting.  Endocrine: Negative for hot flashes.  Genitourinary:  Positive for pelvic pain. Negative for difficulty urinating, dysuria, frequency, hematuria, menstrual problem, vaginal bleeding and vaginal discharge.   Musculoskeletal:  Negative for arthralgias.  Skin:  Negative for itching and rash.  Neurological:  Negative for dizziness, extremity weakness, headaches and numbness.  Hematological:  Negative for adenopathy. Does not bruise/bleed easily.  Psychiatric/Behavioral:  Negative for depression. The patient is not nervous/anxious.     PHYSICAL EXAMINATION  ECOG PERFORMANCE STATUS: 1 - Symptomatic but completely ambulatory  Vitals:   07/09/22 1245  BP: (!) 155/107  Pulse: 91  Resp: 18  Temp: 97.9 F (36.6 C)  SpO2: 100%    Physical Exam Constitutional:      General: She is not in acute distress.    Appearance: Normal appearance. She is not toxic-appearing.  HENT:     Head: Normocephalic and atraumatic.  Eyes:     General: No scleral icterus. Cardiovascular:     Rate and Rhythm: Normal rate and regular rhythm.     Pulses: Normal pulses.     Heart  sounds: Normal heart sounds.  Pulmonary:     Effort: Pulmonary effort is normal.     Breath sounds: Normal breath sounds.  Abdominal:     General: Abdomen is flat. Bowel sounds are normal. There is no distension.     Palpations: Abdomen is soft. There is no mass.     Tenderness: There is abdominal tenderness (+TTP in LLQ). There is guarding (to LLQ). There is no right CVA tenderness, left CVA tenderness or rebound.  Musculoskeletal:        General: No swelling.     Cervical back: Neck supple.  Lymphadenopathy:     Cervical: No cervical  adenopathy.  Skin:    General: Skin is warm and dry.     Findings: No rash.  Neurological:     General: No focal deficit present.     Mental Status: She is alert.  Psychiatric:        Mood and Affect: Mood normal.        Behavior: Behavior normal.     LABORATORY DATA:  CBC    Component Value Date/Time   WBC 6.6 07/09/2022 1327   WBC 6.4 07/04/2018 1423   RBC 4.60 07/09/2022 1327   HGB 14.6 07/09/2022 1327   HGB 14.7 05/21/2022 0906   HCT 42.9 07/09/2022 1327   HCT 43.0 05/21/2022 0906   PLT 221 07/09/2022 1327   PLT 195 05/21/2022 0906   MCV 93.3 07/09/2022 1327   MCV 93 05/21/2022 0906   MCH 31.7 07/09/2022 1327   MCHC 34.0 07/09/2022 1327   RDW 12.8 07/09/2022 1327   RDW 12.5 05/21/2022 0906   LYMPHSABS 2.0 07/09/2022 1327   LYMPHSABS 1.7 05/21/2022 0906   MONOABS 0.4 07/09/2022 1327   EOSABS 0.1 07/09/2022 1327   EOSABS 0.1 05/21/2022 0906   BASOSABS 0.0 07/09/2022 1327   BASOSABS 0.0 05/21/2022 0906    CMP     Component Value Date/Time   NA 141 05/21/2022 0906   K 4.2 05/21/2022 0906   CL 102 05/21/2022 0906   CO2 23 05/21/2022 0906   GLUCOSE 110 (H) 05/21/2022 0906   GLUCOSE 108 (H) 04/29/2019 0946   BUN 12 05/21/2022 0906   CREATININE 0.98 05/21/2022 0906   CREATININE 0.82 04/29/2019 0946   CALCIUM 9.5 05/21/2022 0906   PROT 6.6 05/21/2022 0906   ALBUMIN 4.7 05/21/2022 0906   AST 17 05/21/2022 0906   AST 19  04/29/2019 0946   ALT 23 05/21/2022 0906   ALT 29 04/29/2019 0946   ALKPHOS 58 05/21/2022 0906   BILITOT 0.5 05/21/2022 0906   BILITOT 0.5 04/29/2019 0946   GFRNONAA 78 02/19/2020 0902   GFRNONAA >60 04/29/2019 0946   GFRAA 89 02/19/2020 0902   GFRAA >60 04/29/2019 0946       ASSESSMENT and THERAPY PLAN:   Malignant neoplasm of overlapping sites of left breast in female, estrogen receptor positive ([place]) Dawn Booker is a 66 year old woman with history of stage Ib estrogen positive breast cancer diagnosed in September 2019 status post bilateral mastectomies, adjuvant radiation, and did not undergo antiestrogen therapy.  Dawn Booker has had this interesting left lower quadrant abdominal discomfort for several years however it is at its height right now.  The left ovary tissue that was noted on imaging last year has been worrisome to her as she knows that it is abnormal to have things on your ovaries after you have gone through menopause.  She has undergone GI work-up with Dr. Collene Mares including colonoscopy which did not show diverticulosis or an etiology for this pain.  I ordered CT scan of the abdomen and pelvis with contrast both oral and IV to evaluate for any peritoneal metastasis of her cancer or anything going on in that left ovary in particular the area where the indeterminate tissue was noted, or any potential GI source such as diverticulosis/diverticulitis.  Sandy and I will touch base in 1 week to have a phone visit to review these results and next steps.    All questions were answered. The patient knows to call the clinic with any problems, questions or concerns. We can certainly see the patient much sooner if necessary.  Total encounter time:30 minutes*in face-to-face visit time, chart review, lab review, care coordination, order entry, and documentation of the encounter time.    Wilber Bihari, NP 07/09/22 1:53 PM Medical Oncology and Hematology [facility] [place], [place] [address] Tel. [phone]    Fax. [phone]  *Total Encounter Time as defined by the Centers for Medicare and Medicaid Services includes, in addition to the face-to-face time of a patient visit (documented in the note above) non-face-to-face time: obtaining and reviewing outside history, ordering and reviewing medications, tests or procedures, care coordination (communications with other health care professionals or caregivers) and documentation in the medical record.

## 2022-07-09 NOTE — Telephone Encounter (Signed)
Pt called and states she is experiencing left lower abdominal discomfort. States she is having normal Bms, denies bloody stools. Denies UTI sx. States she saw Dr Garwin Brothers in September and was told she had a fissure or a fistula; pt was not certain which one, and Dr Garwin Brothers told her it was leaking but this was nothing to be concerned about. She verbalizes she is anxious about this. Pt was offered appt with our NP for [facility] visit today at 1245 and accepted. Placed call to Dr Garwin Brothers office to obtain most recent office note to better understand what Dr Garwin Brothers discussed with pt.

## 2022-07-09 NOTE — Telephone Encounter (Signed)
-----   Message from Gardenia Phlegm, NP sent at 07/09/2022  2:16 PM EST ----- Please let patient know that the labs that we tested today are normal.  We will wait to find out more with the CT scan. ----- Message ----- From: Interface, Lab In [facility] Sent: 07/09/2022   1:38 PM EST To: Gardenia Phlegm, NP

## 2022-07-09 NOTE — Telephone Encounter (Signed)
Per Wilber Bihari, DNP, called pt with message below. Pt verbalized understanding

## 2022-07-10 ENCOUNTER — Other Ambulatory Visit: Payer: Self-pay | Admitting: Physician Assistant

## 2022-07-10 DIAGNOSIS — F331 Major depressive disorder, recurrent, moderate: Secondary | ICD-10-CM

## 2022-07-10 DIAGNOSIS — F411 Generalized anxiety disorder: Secondary | ICD-10-CM

## 2022-07-12 ENCOUNTER — Encounter: Payer: Self-pay | Admitting: Nurse Practitioner

## 2022-07-12 ENCOUNTER — Other Ambulatory Visit: Payer: Self-pay | Admitting: Nurse Practitioner

## 2022-07-12 ENCOUNTER — Ambulatory Visit: Payer: PPO

## 2022-07-12 DIAGNOSIS — M810 Age-related osteoporosis without current pathological fracture: Secondary | ICD-10-CM

## 2022-07-12 MED ORDER — PROLIA 60 MG/ML ~~LOC~~ SOSY: 60.0000 mg | PREFILLED_SYRINGE | Freq: Once | SUBCUTANEOUS | 0 refills | Status: AC

## 2022-07-16 ENCOUNTER — Telehealth: Payer: PPO | Admitting: Adult Health

## 2022-07-17 ENCOUNTER — Ambulatory Visit (HOSPITAL_COMMUNITY)
Admission: RE | Admit: 2022-07-17 | Discharge: 2022-07-17 | Disposition: A | Discharge status: Home / Self Care | Payer: PPO | Source: Ambulatory Visit | Attending: Adult Health | Admitting: Adult Health

## 2022-07-17 DIAGNOSIS — C50812 Malignant neoplasm of overlapping sites of left female breast: Secondary | ICD-10-CM | POA: Diagnosis not present

## 2022-07-17 DIAGNOSIS — Z170 Estrogen receptor positive status [ER+]: Secondary | ICD-10-CM | POA: Diagnosis not present

## 2022-07-17 DIAGNOSIS — R109 Unspecified abdominal pain: Secondary | ICD-10-CM | POA: Diagnosis not present

## 2022-07-17 DIAGNOSIS — K760 Fatty (change of) liver, not elsewhere classified: Secondary | ICD-10-CM | POA: Diagnosis not present

## 2022-07-17 DIAGNOSIS — I700 Atherosclerosis of aorta: Secondary | ICD-10-CM | POA: Diagnosis not present

## 2022-07-17 MED ORDER — IOHEXOL 9 MG/ML PO SOLN
1000.0000 mL | Freq: Once | ORAL | Status: AC
  Administered 2022-07-17: 1000 mL via ORAL

## 2022-07-17 MED ORDER — IOHEXOL 300 MG/ML  SOLN
100.0000 mL | Freq: Once | INTRAMUSCULAR | Status: AC | PRN
  Administered 2022-07-17: 100 mL via INTRAVENOUS

## 2022-07-20 ENCOUNTER — Telehealth: Payer: Self-pay | Admitting: Adult Health

## 2022-07-20 ENCOUNTER — Ambulatory Visit (HOSPITAL_COMMUNITY): Payer: PPO

## 2022-07-20 NOTE — Telephone Encounter (Signed)
Pt called relayed message. Verbalized understanding.

## 2022-07-20 NOTE — Telephone Encounter (Signed)
-----   Message from Merril Abbe, LPN sent at 51/70/0174  3:42 PM EST -----  ----- Message ----- From: Gardenia Phlegm, NP Sent: 07/19/2022   8:33 AM EST To: [facility] 4  Ct scans shows no cancer.  Would recommend f/u with PCP and/or GI to discuss her abdominal concerns.   ----- Message ----- From: Interface, Rad Results In Sent: 07/18/2022   3:42 PM EST To: Gardenia Phlegm, NP

## 2022-07-23 ENCOUNTER — Inpatient Hospital Stay (HOSPITAL_BASED_OUTPATIENT_CLINIC_OR_DEPARTMENT_OTHER): Payer: PPO | Admitting: Adult Health

## 2022-07-23 DIAGNOSIS — C50812 Malignant neoplasm of overlapping sites of left female breast: Secondary | ICD-10-CM

## 2022-07-23 DIAGNOSIS — Z170 Estrogen receptor positive status [ER+]: Secondary | ICD-10-CM

## 2022-07-24 ENCOUNTER — Ambulatory Visit (INDEPENDENT_AMBULATORY_CARE_PROVIDER_SITE_OTHER): Payer: PPO | Admitting: Nurse Practitioner

## 2022-07-24 ENCOUNTER — Ambulatory Visit: Payer: PPO | Admitting: Nurse Practitioner

## 2022-07-24 DIAGNOSIS — M810 Age-related osteoporosis without current pathological fracture: Secondary | ICD-10-CM | POA: Diagnosis not present

## 2022-07-24 MED ORDER — DENOSUMAB 60 MG/ML ~~LOC~~ SOSY
60.0000 mg | PREFILLED_SYRINGE | Freq: Once | SUBCUTANEOUS | Status: AC
  Administered 2022-07-24: 60 mg via SUBCUTANEOUS

## 2022-07-24 NOTE — Progress Notes (Deleted)
Patient tolerated well. Declined 15 min wait for eval.  

## 2022-07-27 NOTE — Progress Notes (Signed)
I connected with Dawn Booker 07/27/22 at  2:45 PM EST by telephone and verified that I am speaking with the correct person using two identifiers.  I discussed the limitations, risks, security and privacy concerns of performing an evaluation and management service by telephone and the availability of in person appointments.  I also discussed with the patient that there may be a patient responsible charge related to this service. The patient expressed understanding and agreed to proceed.   Dawn Booker is a 66 year old woman with stage IIa estrogen progesterone positive left-sided breast cancer diagnosed in September 2019 status post bilateral mastectomies, adjuvant radiation, and anastrozole daily later switched to tamoxifen--and then opted to forego antiestrogen therapy.  At her last visit she was complaining of abdominal fullness and stomach pain and underwent CT chest abdomen and pelvis on November 15 demonstrated no acute findings or evidence of metastatic disease in the abdomen and pelvis.  She tells me that she has been feeling similar to slightly better since the CT scan.  Meds, PMH, FH, Social hx, all reviewed   O.  Patient sounds well in no apparent distress.  Mood and behavior normal  CT A/P from 11/15 reviewed  A/P  Dawn Booker is a 66 year old with stage IIa estrogen progesterone positive breast cancer currently on observation alone.  We are discussing the results of her CT abdomen and pelvis today which reviewed no metastatic disease in the abdomen.  Considering her continued fullness and GI issues I recommended that she follow-up with her primary care or her GI specialist to discuss this further as she could have an element of irritable bowel disease or colitis that could be evaluated further if necessary.  [name] verbalized understanding of this and was thankful for the call.  We will see her back in January for follow-up.  Follow up instructions:    -Return to cancer center 09/2022 for  f/u with Dr. Lindi Adie  -f/u with PCP or GI about the above   The patient was provided an opportunity to ask questions and all were answered. The patient agreed with the plan and demonstrated an understanding of the instructions.   The patient was advised to call back or seek an in-person evaluation if the symptoms worsen or if the condition fails to improve as anticipated.   I provided 5 minutes of non face-to-face telephone visit time during this encounter, and > 50% was spent counseling as documented under my assessment & plan.  Wilber Bihari, NP 07/27/22 8:34 AM Medical Oncology and Hematology [facility] [place], [place] [address] Tel. [phone]    Fax. [phone]

## 2022-09-04 ENCOUNTER — Other Ambulatory Visit: Payer: Self-pay | Admitting: *Deleted

## 2022-09-04 DIAGNOSIS — F331 Major depressive disorder, recurrent, moderate: Secondary | ICD-10-CM

## 2022-09-04 DIAGNOSIS — F411 Generalized anxiety disorder: Secondary | ICD-10-CM

## 2022-09-04 DIAGNOSIS — F5105 Insomnia due to other mental disorder: Secondary | ICD-10-CM

## 2022-09-04 MED ORDER — TEMAZEPAM 30 MG PO CAPS: ORAL_CAPSULE | ORAL | 0 refills | Status: DC

## 2022-09-04 NOTE — Telephone Encounter (Signed)
Request has been forwarded to Leretha Pol, FNP for review.

## 2022-09-04 NOTE — Telephone Encounter (Signed)
Pt calling to request a refill on below.  She would like it to be sent in as a 100 day supply.  She said that her insurance covers 100 day supply now. Taeshawn Helfman Zimmerman Rumple, CMA     temazepam (RESTORIL) 30 MG capsule   [place], [place] - [address]   LOV:05/23/23 ROV:09/25/22

## 2022-09-18 ENCOUNTER — Telehealth: Payer: Self-pay | Admitting: Hematology and Oncology

## 2022-09-18 ENCOUNTER — Inpatient Hospital Stay: Payer: PPO | Attending: Adult Health | Admitting: Hematology and Oncology

## 2022-09-18 VITALS — BP 151/88 | HR 100 | Temp 97.5°F | Resp 17 | Wt 161.7 lb

## 2022-09-18 DIAGNOSIS — Z9013 Acquired absence of bilateral breasts and nipples: Secondary | ICD-10-CM | POA: Insufficient documentation

## 2022-09-18 DIAGNOSIS — Z79899 Other long term (current) drug therapy: Secondary | ICD-10-CM | POA: Insufficient documentation

## 2022-09-18 DIAGNOSIS — Z170 Estrogen receptor positive status [ER+]: Secondary | ICD-10-CM | POA: Diagnosis not present

## 2022-09-18 DIAGNOSIS — C50812 Malignant neoplasm of overlapping sites of left female breast: Secondary | ICD-10-CM | POA: Insufficient documentation

## 2022-09-18 DIAGNOSIS — Z923 Personal history of irradiation: Secondary | ICD-10-CM | POA: Insufficient documentation

## 2022-09-18 DIAGNOSIS — Z79811 Long term (current) use of aromatase inhibitors: Secondary | ICD-10-CM | POA: Diagnosis not present

## 2022-09-18 NOTE — Assessment & Plan Note (Signed)
06/13/2018:Bilateral mastectomies: Left: ILC, grade 2, 5.4 cm, LCIS, margins negative, lymphovascular invasion present, 2/7 lymph nodes positive with extranodal extension (1 additional lymph node had isolated tumor cells), right mastectomy benign; T3N1a ER 100%, PR 10%, HER-2 -1+, Ki-67 10 to 15%, stage Ib MammaPrint: Low risk, luminal type a Adjuvant radiation therapy: 08/14/2018- MRI back: L4 and L5 small lesions could not rule out metastatic disease.  PET CT negative for bone metastases.   Treatment: Tamoxifen 20 mg daily Started 11/07/2018-stopped 01/01/2019 due to profound depression/anxiety Couldn't tolerate Anastrozole   10/28/2018 PET CT scan: No evidence of metastatic disease, no hypermetabolism at L4-L5 vertebral bodies to correspond to the MRI findings.   CT abdomen 06/30/2019: No findings to suggest the cause of abdominal discomfort.  No metastatic disease Osteoporosis: T score -3.1   Breast cancer surveillance: No role of imaging since she had bilateral mastectomies Chest exam 09/18/2022: Benign Return to clinic on an as-needed basis

## 2022-09-18 NOTE — Progress Notes (Signed)
Patient Care Team: Lorrene Reid, PA-C as PCP - General Nicholas Lose, MD as Consulting Physician (Hematology and Oncology) Jovita Kussmaul, MD as Consulting Physician (General Surgery) Eppie Gibson, MD as Attending Physician (Radiation Oncology) Ursula Alert, MD as Consulting Physician (Psychiatry) Delice Bison Charlestine Massed, NP as Nurse Practitioner (Hematology and Oncology) Marlou Sa Tonna Corner, MD as Consulting Physician (Orthopedic Surgery) Juanita Craver, MD as Consulting Physician (Gastroenterology)  DIAGNOSIS:  Encounter Diagnosis  Name Primary?   Malignant neoplasm of overlapping sites of left breast in female, estrogen receptor positive ([place]) Yes    SUMMARY OF ONCOLOGIC HISTORY: Oncology History  Malignant neoplasm of overlapping sites of left breast in female, estrogen receptor positive ([place])  07/2015 Miscellaneous   Genetics negative.  Genes tested: APC, ATM, BARD1, BMPR1A, BRCA1, BRCA2, BRIP1, CDH1, CDK4, CDKN2A, CHEK2, EPCAM, GREM1, MLH1, MSH2, MSH6, MUTYH, PALB2, PMS2, POLD1, POLE, PTEN, RAD51C, RAD51D, RET, SMAD4, STK11, and TP53.      05/06/2018 Initial Diagnosis   Screening detected left breast mass anteriorly 1030 to 11 o'clock position 2 masses 6 mm and 7 mm, 12:30 position 2.6 cm both of these masses biopsy-proven grade 2 invasive lobular cancer ER 100%, PR 10%, Ki-67 15%, HER-2 negative 1+ by IHC, lymph node positive for malignancy T2N1 stage IIa clinical stage   05/21/2018 Cancer Staging   Staging form: Breast, AJCC 8th Edition - Pathologic: Stage IB (pT3, pN1a, cM0, G2, ER+, PR+, HER2-) - Signed by Nicholas Lose, MD on 06/24/2018   06/13/2018 Surgery   Bilateral mastectomies: Left: ILC, grade 2, 5.4 cm, LCIS, margins negative, lymphovascular invasion present, 2/7 lymph nodes positive with extranodal extension (1 additional lymph node had isolated tumor cells), right mastectomy benign; T3N1a ER 100%, PR 10%, HER-2 -1+, Ki-67 10 to 15%, stage Ib   08/06/2018 -  09/25/2018 Radiation Therapy   Adjuvant XRT   10/09/2018 -  Anti-estrogen oral therapy   Anastrozole '1mg'$  daily, plan for 7 years; switched to tamoxifen 11/07/18 for bone health; opted to forego all antiestrogen therapy     CHIEF COMPLIANT: Surveillance of breast cancer   INTERVAL HISTORY: Dawn Booker is a 67 year old with 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  She presents to the clinic for a follow-up. She states that she is doing very good. She has no issues or concerns to report to the clinic today.    ALLERGIES:  is allergic to lamictal [lamotrigine], cymbalta [duloxetine hcl], hydrocodone, mobic [meloxicam], sulfa antibiotics, brintellix [vortioxetine], corticosteroids, morphine, other, prednisone, prozac [fluoxetine hcl], and zoloft [sertraline].  MEDICATIONS:  Current Outpatient Medications  Medication Sig Dispense Refill   acetaminophen (TYLENOL) 500 MG tablet Take 1,000 mg by mouth at bedtime.     buPROPion (WELLBUTRIN XL) 150 MG 24 hr tablet TAKE 1 TABLET BY MOUTH DAILY 90 tablet 0   Cholecalciferol (VITAMIN D) 50 MCG (2000 UT) CAPS Take by mouth. 1 capsule daily     Coenzyme Q10 (COQ10) 100 MG CAPS Take 1 capsule by mouth daily.     Cyanocobalamin (VITAMIN B12) 500 MCG TABS Take by mouth. 1 tablet by mouth daily     diphenhydrAMINE (BENADRYL) 25 mg capsule Take 25 mg by mouth at bedtime.     folic acid (FOLVITE) 270 MCG tablet Take 400 mcg by mouth daily.     Magnesium 400 MG TABS Take 400 mg by mouth.     omeprazole (PRILOSEC) 40 MG capsule TAKE 1 CAPSULE BY MOUTH DAILY 90  capsule 0   OVER THE COUNTER MEDICATION 1 tablet daily. Vegan Calcium 1000     rosuvastatin (CRESTOR) 10 MG tablet TAKE 1 TABLET BY MOUTH DAILY AT BEDTIME 90 tablet 1   temazepam (RESTORIL) 30 MG capsule TAKE 1 CAPSULE BY MOUTH EACH NIGHT AT BEDTIME 100 capsule 0   No current facility-administered  medications for this visit.    PHYSICAL EXAMINATION: ECOG PERFORMANCE STATUS: 0 - Asymptomatic  Vitals:   09/18/22 1332  BP: (!) 151/88  Pulse: 100  Resp: 17  Temp: (!) 97.5 F (36.4 C)  SpO2: 100%   Filed Weights   09/18/22 1332  Weight: 161 lb 11.2 oz (73.3 kg)    BREAST: No palpable lumps nodules bilateral chest wall or axilla (exam performed in the presence of a chaperone)  LABORATORY DATA:  I have reviewed the data as listed    Latest Ref Rng & Units 07/09/2022    1:27 PM 05/21/2022    9:06 AM 12/18/2021    2:31 PM  CMP  Glucose 70 - 99 mg/dL 98  110  109   BUN 8 - 23 mg/dL '12  12  13   '$ Creatinine 0.44 - 1.00 mg/dL 0.84  0.98  0.79   Sodium 135 - 145 mmol/L 140  141  141   Potassium 3.5 - 5.1 mmol/L 3.9  4.2  4.0   Chloride 98 - 111 mmol/L 104  102  105   CO2 22 - 32 mmol/L '29  23  23   '$ Calcium 8.9 - 10.3 mg/dL 9.6  9.5  8.8   Total Protein 6.5 - 8.1 g/dL 7.4  6.6  6.6   Total Bilirubin 0.3 - 1.2 mg/dL 0.7  0.5  0.4   Alkaline Phos 38 - 126 U/L 57  58  48   AST 15 - 41 U/L '16  17  21   '$ ALT 0 - 44 U/L '19  23  23     '$ Lab Results  Component Value Date   WBC 6.6 07/09/2022   HGB 14.6 07/09/2022   HCT 42.9 07/09/2022   MCV 93.3 07/09/2022   PLT 221 07/09/2022   NEUTROABS 4.1 07/09/2022    ASSESSMENT & PLAN:  Malignant neoplasm of overlapping sites of left breast in female, estrogen receptor positive ([place]) 06/13/2018:Bilateral mastectomies: Left: ILC, grade 2, 5.4 cm, LCIS, margins negative, lymphovascular invasion present, 2/7 lymph nodes positive with extranodal extension (1 additional lymph node had isolated tumor cells), right mastectomy benign; T3N1a ER 100%, PR 10%, HER-2 -1+, Ki-67 10 to 15%, stage Ib MammaPrint: Low risk, luminal type a Adjuvant radiation therapy: 08/14/2018- MRI back: L4 and L5 small lesions could not rule out metastatic disease.  PET CT negative for bone metastases.   Treatment: Tamoxifen 20 mg daily Started 11/07/2018-stopped  01/01/2019 due to profound depression/anxiety Couldn't tolerate Anastrozole   10/28/2018 PET CT scan: No evidence of metastatic disease, no hypermetabolism at L4-L5 vertebral bodies to correspond to the MRI findings.   CT abdomen 06/30/2019: No findings to suggest the cause of abdominal discomfort.  No metastatic disease Osteoporosis: T score -3.1   Breast cancer surveillance: No role of imaging since she had bilateral mastectomies Chest exam 09/18/2022: Benign Return to clinic in 1 year for follow-up and after that she could be seen on an as-needed basis.     No orders of the defined types were placed in this encounter.  The patient has a good understanding of the overall plan. she agrees with it.  she will call with any problems that may develop before the next visit here. Total time spent: 30 mins including face to face time and time spent for planning, charting and co-ordination of care   Harriette Ohara, MD 09/18/22    I Gardiner Coins am acting as a Education administrator for [employer]  I have reviewed the above documentation for accuracy and completeness, and I agree with the above.

## 2022-09-19 DIAGNOSIS — Z90710 Acquired absence of both cervix and uterus: Secondary | ICD-10-CM | POA: Diagnosis not present

## 2022-09-19 DIAGNOSIS — M810 Age-related osteoporosis without current pathological fracture: Secondary | ICD-10-CM | POA: Diagnosis not present

## 2022-09-19 DIAGNOSIS — Z9013 Acquired absence of bilateral breasts and nipples: Secondary | ICD-10-CM | POA: Diagnosis not present

## 2022-09-19 DIAGNOSIS — Z01419 Encounter for gynecological examination (general) (routine) without abnormal findings: Secondary | ICD-10-CM | POA: Diagnosis not present

## 2022-09-19 DIAGNOSIS — K219 Gastro-esophageal reflux disease without esophagitis: Secondary | ICD-10-CM | POA: Diagnosis not present

## 2022-09-25 ENCOUNTER — Other Ambulatory Visit: Payer: Self-pay

## 2022-09-25 ENCOUNTER — Encounter: Payer: Self-pay | Admitting: Nurse Practitioner

## 2022-09-25 ENCOUNTER — Ambulatory Visit (INDEPENDENT_AMBULATORY_CARE_PROVIDER_SITE_OTHER): Payer: PPO | Admitting: Nurse Practitioner

## 2022-09-25 VITALS — BP 117/79 | HR 85 | Ht 65.0 in | Wt 161.4 lb

## 2022-09-25 DIAGNOSIS — E785 Hyperlipidemia, unspecified: Secondary | ICD-10-CM

## 2022-09-25 DIAGNOSIS — M542 Cervicalgia: Secondary | ICD-10-CM | POA: Diagnosis not present

## 2022-09-25 DIAGNOSIS — F411 Generalized anxiety disorder: Secondary | ICD-10-CM | POA: Diagnosis not present

## 2022-09-25 DIAGNOSIS — F331 Major depressive disorder, recurrent, moderate: Secondary | ICD-10-CM

## 2022-09-25 DIAGNOSIS — Z23 Encounter for immunization: Secondary | ICD-10-CM | POA: Diagnosis not present

## 2022-09-25 MED ORDER — ROSUVASTATIN CALCIUM 10 MG PO TABS: 10.0000 mg | ORAL_TABLET | Freq: Every day | ORAL | 1 refills | Status: DC

## 2022-09-25 MED ORDER — BUPROPION HCL ER (XL) 150 MG PO TB24: 150.0000 mg | ORAL_TABLET | Freq: Every day | ORAL | 1 refills | Status: DC

## 2022-09-25 NOTE — Progress Notes (Signed)
Established patient visit   Patient: Dawn Booker   DOB: [birth year]   67 y.o. Female  MRN: [id number] Visit Date: 09/25/2022   Chief Complaint  Patient presents with   Follow-up   Subjective    HPI  Follow up  -anxiety  -sleep difficulty  --doing well on current doses of wellbutrin and taking temazepam at bedtime.  Joint pain in the thumbs of both hands  -has noted some weakness in both arms.  -no new concerns or complaints.  -She denies chest pain, chest pressure, or shortness of breath. She denies headaches or visual disturbances. She denies abdominal pain, nausea, vomiting, or changes in bowel or bladder habits.   -muscle tightness in neck and shoulders.    Medications: Outpatient Medications Prior to Visit  Medication Sig   acetaminophen (TYLENOL) 500 MG tablet Take 1,000 mg by mouth at bedtime.   Cholecalciferol (VITAMIN D) 50 MCG (2000 UT) CAPS Take by mouth. 1 capsule daily   Coenzyme Q10 (COQ10) 100 MG CAPS Take 1 capsule by mouth daily.   Cyanocobalamin (VITAMIN B12) 500 MCG TABS Take by mouth. 1 tablet by mouth daily   diphenhydrAMINE (BENADRYL) 25 mg capsule Take 25 mg by mouth at bedtime.   folic acid (FOLVITE) A999333 MCG tablet Take 400 mcg by mouth daily.   Magnesium 400 MG TABS Take 400 mg by mouth.   OVER THE COUNTER MEDICATION 1 tablet daily. Vegan Calcium 1000   temazepam (RESTORIL) 30 MG capsule TAKE 1 CAPSULE BY MOUTH EACH NIGHT AT BEDTIME   [DISCONTINUED] buPROPion (WELLBUTRIN XL) 150 MG 24 hr tablet TAKE 1 TABLET BY MOUTH DAILY   [DISCONTINUED] omeprazole (PRILOSEC) 40 MG capsule TAKE 1 CAPSULE BY MOUTH DAILY   [DISCONTINUED] rosuvastatin (CRESTOR) 10 MG tablet TAKE 1 TABLET BY MOUTH DAILY AT BEDTIME   No facility-administered medications prior to visit.    Review of Systems  Constitutional:  Negative for activity change, appetite change, chills, fatigue and fever.  HENT:  Negative for congestion, postnasal drip, rhinorrhea, sinus pressure, sinus  pain, sneezing and sore throat.   Eyes: Negative.   Respiratory:  Negative for cough, chest tightness, shortness of breath and wheezing.   Cardiovascular:  Negative for chest pain and palpitations.  Gastrointestinal:  Negative for abdominal pain, constipation, diarrhea, nausea and vomiting.  Endocrine: Negative for cold intolerance, heat intolerance, polydipsia and polyuria.  Genitourinary:  Negative for dyspareunia, dysuria, flank pain, frequency and urgency.  Musculoskeletal:  Positive for arthralgias, joint swelling and neck pain. Negative for back pain and myalgias.  Skin:  Negative for rash.  Allergic/Immunologic: Negative for environmental allergies.  Neurological:  Negative for dizziness, weakness and headaches.  Hematological:  Negative for adenopathy.  Psychiatric/Behavioral:  Positive for dysphoric mood and sleep disturbance. The patient is nervous/anxious.     Last CBC Lab Results  Component Value Date   WBC 6.6 07/09/2022   HGB 14.6 07/09/2022   HCT 42.9 07/09/2022   MCV 93.3 07/09/2022   MCH 31.7 07/09/2022   RDW 12.8 07/09/2022   PLT 221 0000000   Last metabolic panel Lab Results  Component Value Date   GLUCOSE 98 07/09/2022   NA 140 07/09/2022   K 3.9 07/09/2022   CL 104 07/09/2022   CO2 29 07/09/2022   BUN 12 07/09/2022   CREATININE 0.84 07/09/2022   GFRNONAA >60 07/09/2022   CALCIUM 9.6 07/09/2022   PROT 7.4 07/09/2022   ALBUMIN 4.6 07/09/2022   LABGLOB 1.9 05/21/2022   AGRATIO 2.5 (H) 05/21/2022  BILITOT 0.7 07/09/2022   ALKPHOS 57 07/09/2022   AST 16 07/09/2022   ALT 19 07/09/2022   ANIONGAP 7 07/09/2022   Last lipids Lab Results  Component Value Date   CHOL 160 05/21/2022   HDL 55 05/21/2022   LDLCALC 86 05/21/2022   TRIG 103 05/21/2022   CHOLHDL 2.9 05/21/2022   Last hemoglobin A1c Lab Results  Component Value Date   HGBA1C 6.0 (H) 05/21/2022   Last thyroid functions Lab Results  Component Value Date   TSH 2.690 06/05/2022    T3TOTAL 88 06/05/2022   Last vitamin D Lab Results  Component Value Date   VD25OH 78.8 01/10/2021   Last vitamin B12 and Folate Lab Results  Component Value Date   VITAMINB12 273 12/18/2021   FOLATE >20.0 12/18/2021       Objective     Today's Vitals   09/25/22 1338  BP: 117/79  Pulse: 85  SpO2: 97%  Weight: 161 lb 6.4 oz (73.2 kg)  Height: '5\' 5"'$  (1.651 m)   Body mass index is 26.86 kg/m.  BP Readings from Last 3 Encounters:  09/25/22 117/79  09/18/22 (Abnormal) 151/88  07/09/22 (Abnormal) 155/107    Wt Readings from Last 3 Encounters:  09/25/22 161 lb 6.4 oz (73.2 kg)  09/18/22 161 lb 11.2 oz (73.3 kg)  07/09/22 159 lb 6.4 oz (72.3 kg)    Physical Exam Vitals and nursing note reviewed.  Constitutional:      Appearance: Normal appearance. She is well-developed.  HENT:     Head: Normocephalic and atraumatic.     Nose: Nose normal.     Mouth/Throat:     Mouth: Mucous membranes are moist.     Pharynx: Oropharynx is clear.  Eyes:     Extraocular Movements: Extraocular movements intact.     Conjunctiva/sclera: Conjunctivae normal.     Pupils: Pupils are equal, round, and reactive to light.  Cardiovascular:     Rate and Rhythm: Normal rate and regular rhythm.     Pulses: Normal pulses.     Heart sounds: Normal heart sounds.  Pulmonary:     Effort: Pulmonary effort is normal.     Breath sounds: Normal breath sounds.  Abdominal:     Palpations: Abdomen is soft.  Musculoskeletal:     Cervical back: Neck supple. Pain with movement and muscular tenderness present. Decreased range of motion.  Lymphadenopathy:     Cervical: No cervical adenopathy.  Skin:    General: Skin is warm and dry.     Capillary Refill: Capillary refill takes less than 2 seconds.  Neurological:     General: No focal deficit present.     Mental Status: She is alert and oriented to person, place, and time.  Psychiatric:        Mood and Affect: Mood normal.        Behavior: Behavior  normal.        Thought Content: Thought content normal.        Judgment: Judgment normal.      Assessment & Plan    1. Hyperlipidemia, unspecified hyperlipidemia type Lipid panel stable. Continue crestor 10 mg daily  - rosuvastatin (CRESTOR) 10 MG tablet; Take 1 tablet (10 mg total) by mouth at bedtime.  Dispense: 90 tablet; Refill: 1  2. GAD (generalized anxiety disorder) Stable. Continue bupropion XL 150 mg daily.  - buPROPion (WELLBUTRIN XL) 150 MG 24 hr tablet; Take 1 tablet (150 mg total) by mouth daily.  Dispense: 90 tablet;  Refill: 1  3. MDD (major depressive disorder), recurrent episode, moderate (HCC) Stable. Continue bupropion XL 150 mg daily.  - buPROPion (WELLBUTRIN XL) 150 MG 24 hr tablet; Take 1 tablet (150 mg total) by mouth daily.  Dispense: 90 tablet; Refill: 1  4. Neck pain Written information regarding neck and shoulder stretches given at check out.     Problem List Items Addressed This Visit       Other   MDD (major depressive disorder), recurrent episode, moderate (HCC)   Relevant Medications   buPROPion (WELLBUTRIN XL) 150 MG 24 hr tablet   GAD (generalized anxiety disorder)   Relevant Medications   buPROPion (WELLBUTRIN XL) 150 MG 24 hr tablet   Hyperlipidemia - Primary   Relevant Medications   rosuvastatin (CRESTOR) 10 MG tablet   Neck pain     Return in about 4 months (around 01/24/2023) for medicare wellness, FBW at time of visit.         Ronnell Freshwater, NP  [facility] [phone] (phone) [phone] (fax)  [place]

## 2022-09-26 ENCOUNTER — Encounter: Payer: Self-pay | Admitting: Nurse Practitioner

## 2022-09-26 ENCOUNTER — Other Ambulatory Visit: Payer: Self-pay

## 2022-09-26 MED ORDER — OMEPRAZOLE 40 MG PO CPDR: 40.0000 mg | DELAYED_RELEASE_CAPSULE | Freq: Every day | ORAL | 0 refills | Status: DC

## 2022-10-26 DIAGNOSIS — M542 Cervicalgia: Secondary | ICD-10-CM | POA: Insufficient documentation

## 2022-11-05 ENCOUNTER — Other Ambulatory Visit: Payer: Self-pay | Admitting: *Deleted

## 2022-11-05 DIAGNOSIS — C50812 Malignant neoplasm of overlapping sites of left female breast: Secondary | ICD-10-CM

## 2022-11-05 NOTE — Progress Notes (Signed)
Received call from pt with complaint of new left chest wall nodule and ongoing right thoracic pain.  Verbal orders received from MD to obtain left breast US and possible biopsy as well as CT chest with contrast. Orders placed, GI notified to schedule pt.  RN scheduled CT and notified pt of appt dates and time.  Pt verbalized understanding.

## 2022-11-08 ENCOUNTER — Inpatient Hospital Stay: Payer: PPO | Attending: Adult Health

## 2022-11-08 DIAGNOSIS — Z170 Estrogen receptor positive status [ER+]: Secondary | ICD-10-CM | POA: Insufficient documentation

## 2022-11-08 DIAGNOSIS — C50812 Malignant neoplasm of overlapping sites of left female breast: Secondary | ICD-10-CM | POA: Diagnosis not present

## 2022-11-08 DIAGNOSIS — Z79811 Long term (current) use of aromatase inhibitors: Secondary | ICD-10-CM | POA: Insufficient documentation

## 2022-11-08 DIAGNOSIS — Z9013 Acquired absence of bilateral breasts and nipples: Secondary | ICD-10-CM | POA: Diagnosis not present

## 2022-11-08 DIAGNOSIS — Z79899 Other long term (current) drug therapy: Secondary | ICD-10-CM | POA: Diagnosis not present

## 2022-11-08 LAB — CBC WITH DIFFERENTIAL (CANCER CENTER ONLY)
Abs Immature Granulocytes: 0.02 10*3/uL (ref 0.00–0.07)
Basophils Absolute: 0 10*3/uL (ref 0.0–0.1)
Basophils Relative: 0 %
Eosinophils Absolute: 0.1 10*3/uL (ref 0.0–0.5)
Eosinophils Relative: 1 %
HCT: 44.2 % (ref 36.0–46.0)
Hemoglobin: 14.8 g/dL (ref 12.0–15.0)
Immature Granulocytes: 0 %
Lymphocytes Relative: 30 %
Lymphs Abs: 1.9 10*3/uL (ref 0.7–4.0)
MCH: 31.6 pg (ref 26.0–34.0)
MCHC: 33.5 g/dL (ref 30.0–36.0)
MCV: 94.4 fL (ref 80.0–100.0)
Monocytes Absolute: 0.4 10*3/uL (ref 0.1–1.0)
Monocytes Relative: 7 %
Neutro Abs: 3.8 10*3/uL (ref 1.7–7.7)
Neutrophils Relative %: 62 %
Platelet Count: 224 10*3/uL (ref 150–400)
RBC: 4.68 MIL/uL (ref 3.87–5.11)
RDW: 12.7 % (ref 11.5–15.5)
WBC Count: 6.3 10*3/uL (ref 4.0–10.5)
nRBC: 0 % (ref 0.0–0.2)

## 2022-11-08 LAB — CMP (CANCER CENTER ONLY)
ALT: 19 U/L (ref 0–44)
AST: 16 U/L (ref 15–41)
Albumin: 4.8 g/dL (ref 3.5–5.0)
Alkaline Phosphatase: 45 U/L (ref 38–126)
Anion gap: 7 (ref 5–15)
BUN: 11 mg/dL (ref 8–23)
CO2: 28 mmol/L (ref 22–32)
Calcium: 9.1 mg/dL (ref 8.9–10.3)
Chloride: 105 mmol/L (ref 98–111)
Creatinine: 0.92 mg/dL (ref 0.44–1.00)
GFR, Estimated: >60 mL/min (ref >60)
Glucose, Bld: 94 mg/dL (ref 70–99)
Potassium: 4 mmol/L (ref 3.5–5.1)
Sodium: 140 mmol/L (ref 135–145)
Total Bilirubin: 0.5 mg/dL (ref 0.3–1.2)
Total Protein: 7.4 g/dL (ref 6.5–8.1)

## 2022-11-10 ENCOUNTER — Ambulatory Visit
Admission: RE | Admit: 2022-11-10 | Discharge: 2022-11-10 | Disposition: A | Discharge status: Home / Self Care | Payer: PPO | Source: Ambulatory Visit | Attending: Hematology and Oncology | Admitting: Hematology and Oncology

## 2022-11-10 DIAGNOSIS — N6489 Other specified disorders of breast: Secondary | ICD-10-CM | POA: Diagnosis not present

## 2022-11-10 DIAGNOSIS — Z170 Estrogen receptor positive status [ER+]: Secondary | ICD-10-CM

## 2022-11-13 NOTE — Progress Notes (Signed)
Patient Care Team: Pcp, No as PCP - General Nicholas Lose, MD as Consulting Physician (Hematology and Oncology) Jovita Kussmaul, MD as Consulting Physician (General Surgery) Eppie Gibson, MD as Attending Physician (Radiation Oncology) Ursula Alert, MD as Consulting Physician (Psychiatry) Delice Bison Charlestine Massed, NP as Nurse Practitioner (Hematology and Oncology) Marlou Sa Tonna Corner, MD as Consulting Physician (Orthopedic Surgery) Juanita Craver, MD as Consulting Physician (Gastroenterology)  DIAGNOSIS:  Encounter Diagnosis  Name Primary?   Malignant neoplasm of overlapping sites of left breast in female, estrogen receptor positive ([place]) Yes    SUMMARY OF ONCOLOGIC HISTORY: Oncology History  Malignant neoplasm of overlapping sites of left breast in female, estrogen receptor positive ([place])  07/2015 Miscellaneous   Genetics negative.  Genes tested: APC, ATM, BARD1, BMPR1A, BRCA1, BRCA2, BRIP1, CDH1, CDK4, CDKN2A, CHEK2, EPCAM, GREM1, MLH1, MSH2, MSH6, MUTYH, PALB2, PMS2, POLD1, POLE, PTEN, RAD51C, RAD51D, RET, SMAD4, STK11, and TP53.      05/06/2018 Initial Diagnosis   Screening detected left breast mass anteriorly 1030 to 11 o'clock position 2 masses 6 mm and 7 mm, 12:30 position 2.6 cm both of these masses biopsy-proven grade 2 invasive lobular cancer ER 100%, PR 10%, Ki-67 15%, HER-2 negative 1+ by IHC, lymph node positive for malignancy T2N1 stage IIa clinical stage   05/21/2018 Cancer Staging   Staging form: Breast, AJCC 8th Edition - Pathologic: Stage IB (pT3, pN1a, cM0, G2, ER+, PR+, HER2-) - Signed by Nicholas Lose, MD on 06/24/2018   06/13/2018 Surgery   Bilateral mastectomies: Left: ILC, grade 2, 5.4 cm, LCIS, margins negative, lymphovascular invasion present, 2/7 lymph nodes positive with extranodal extension (1 additional lymph node had isolated tumor cells), right mastectomy benign; T3N1a ER 100%, PR 10%, HER-2 -1+, Ki-67 10 to 15%, stage Ib   08/06/2018 - 09/25/2018  Radiation Therapy   Adjuvant XRT   10/09/2018 -  Anti-estrogen oral therapy   Anastrozole 1mg  daily, plan for 7 years; switched to tamoxifen 11/07/18 for bone health; opted to forego all antiestrogen therapy     CHIEF COMPLIANT: Surveillance of breast cancer    INTERVAL HISTORY: Dawn Booker is a 67 year old with 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  She presents to the clinic for a follow-up.  She had recent CT scans and is here to discuss the results.  She has had some upper back pain issues but the CT scan does not show any evidence of metastatic disease.   ALLERGIES:  is allergic to lamictal [lamotrigine], cymbalta [duloxetine hcl], hydrocodone, mobic [meloxicam], sulfa antibiotics, brintellix [vortioxetine], corticosteroids, morphine, other, prednisone, prozac [fluoxetine hcl], and zoloft [sertraline].  MEDICATIONS:  Current Outpatient Medications  Medication Sig Dispense Refill   acetaminophen (TYLENOL) 500 MG tablet Take 1,000 mg by mouth at bedtime.     buPROPion (WELLBUTRIN XL) 150 MG 24 hr tablet Take 1 tablet (150 mg total) by mouth daily. 90 tablet 1   Cholecalciferol (VITAMIN D) 50 MCG (2000 UT) CAPS Take by mouth. 1 capsule daily     Coenzyme Q10 (COQ10) 100 MG CAPS Take 1 capsule by mouth daily.     Cyanocobalamin (VITAMIN B12) 500 MCG TABS Take by mouth. 1 tablet by mouth daily     diphenhydrAMINE (BENADRYL) 25 mg capsule Take 25 mg by mouth at bedtime.     folic acid (FOLVITE) A999333 MCG tablet Take 400 mcg by mouth daily.     Magnesium 400 MG TABS Take 400 mg by mouth.  omeprazole (PRILOSEC) 40 MG capsule Take 1 capsule (40 mg total) by mouth daily. 90 capsule 0   OVER THE COUNTER MEDICATION 1 tablet daily. Vegan Calcium 1000     rosuvastatin (CRESTOR) 10 MG tablet Take 1 tablet (10 mg total) by mouth at bedtime. 90 tablet 1   temazepam (RESTORIL) 30 MG capsule TAKE 1  CAPSULE BY MOUTH EACH NIGHT AT BEDTIME 100 capsule 0   No current facility-administered medications for this visit.    PHYSICAL EXAMINATION: ECOG PERFORMANCE STATUS: 1 - Symptomatic but completely ambulatory  Vitals:   11/19/22 1327  BP: 136/87  Pulse: 90  Resp: 17  Temp: (!) 97.3 F (36.3 C)  SpO2: 97%   Filed Weights   11/19/22 1327  Weight: 160 lb 14.4 oz (73 kg)      LABORATORY DATA:  I have reviewed the data as listed    Latest Ref Rng & Units 11/08/2022    1:49 PM 07/09/2022    1:27 PM 05/21/2022    9:06 AM  CMP  Glucose 70 - 99 mg/dL 94  98  110   BUN 8 - 23 mg/dL 11  12  12    Creatinine 0.44 - 1.00 mg/dL 0.92  0.84  0.98   Sodium 135 - 145 mmol/L 140  140  141   Potassium 3.5 - 5.1 mmol/L 4.0  3.9  4.2   Chloride 98 - 111 mmol/L 105  104  102   CO2 22 - 32 mmol/L 28  29  23    Calcium 8.9 - 10.3 mg/dL 9.1  9.6  9.5   Total Protein 6.5 - 8.1 g/dL 7.4  7.4  6.6   Total Bilirubin 0.3 - 1.2 mg/dL 0.5  0.7  0.5   Alkaline Phos 38 - 126 U/L 45  57  58   AST 15 - 41 U/L 16  16  17    ALT 0 - 44 U/L 19  19  23      Lab Results  Component Value Date   WBC 6.3 11/08/2022   HGB 14.8 11/08/2022   HCT 44.2 11/08/2022   MCV 94.4 11/08/2022   PLT 224 11/08/2022   NEUTROABS 3.8 11/08/2022    ASSESSMENT & PLAN:  Malignant neoplasm of overlapping sites of left breast in female, estrogen receptor positive ([place]) 06/13/2018:Bilateral mastectomies: Left: ILC, grade 2, 5.4 cm, LCIS, margins negative, lymphovascular invasion present, 2/7 lymph nodes positive with extranodal extension (1 additional lymph node had isolated tumor cells), right mastectomy benign; T3N1a ER 100%, PR 10%, HER-2 -1+, Ki-67 10 to 15%, stage Ib MammaPrint: Low risk, luminal type a Adjuvant radiation therapy: 08/14/2018- MRI back: L4 and L5 small lesions could not rule out metastatic disease.  PET CT negative for bone metastases.   Treatment: Tamoxifen 20 mg daily Started 11/07/2018-stopped 01/01/2019 due  to profound depression/anxiety Couldn't tolerate Anastrozole   10/28/2018 PET CT scan: No evidence of metastatic disease, no hypermetabolism at L4-L5 vertebral bodies to correspond to the MRI findings.   CT abdomen 06/30/2019: No findings to suggest the cause of abdominal discomfort.  No metastatic disease Osteoporosis: T score -3.1   Breast cancer surveillance: Palpable chest wall nodule:  11/10/2022: Ultrasound: No suspicious masses 11/16/2022: CT chest: No findings concerning for metastatic disease.  No chest wall tumor  I reassured her that based upon the imaging studies there does not appear to be any palpable abnormalities at the site of concern.  Return to clinic in 1 year  No orders of the defined types were placed in this encounter.  The patient has a good understanding of the overall plan. she agrees with it. she will call with any problems that may develop before the next visit here. Total time spent: 30 mins including face to face time and time spent for planning, charting and co-ordination of care   Harriette Ohara, MD 11/19/22    I Gardiner Coins am acting as a Education administrator for [employer]  I have reviewed the above documentation for accuracy and completeness, and I agree with the above.

## 2022-11-14 ENCOUNTER — Ambulatory Visit (HOSPITAL_COMMUNITY)
Admission: RE | Admit: 2022-11-14 | Discharge: 2022-11-14 | Disposition: A | Discharge status: Home / Self Care | Payer: PPO | Source: Ambulatory Visit | Attending: Hematology and Oncology | Admitting: Hematology and Oncology

## 2022-11-14 ENCOUNTER — Encounter (HOSPITAL_COMMUNITY): Payer: Self-pay

## 2022-11-14 DIAGNOSIS — C50812 Malignant neoplasm of overlapping sites of left female breast: Secondary | ICD-10-CM | POA: Insufficient documentation

## 2022-11-14 DIAGNOSIS — Z170 Estrogen receptor positive status [ER+]: Secondary | ICD-10-CM | POA: Insufficient documentation

## 2022-11-14 DIAGNOSIS — R918 Other nonspecific abnormal finding of lung field: Secondary | ICD-10-CM | POA: Diagnosis not present

## 2022-11-14 MED ORDER — SODIUM CHLORIDE (PF) 0.9 % IJ SOLN
INTRAMUSCULAR | Status: AC
  Filled 2022-11-14: qty 50

## 2022-11-14 MED ORDER — IOHEXOL 300 MG/ML  SOLN
75.0000 mL | Freq: Once | INTRAMUSCULAR | Status: AC | PRN
  Administered 2022-11-14: 75 mL via INTRAVENOUS

## 2022-11-19 ENCOUNTER — Inpatient Hospital Stay (HOSPITAL_BASED_OUTPATIENT_CLINIC_OR_DEPARTMENT_OTHER): Payer: PPO | Admitting: Hematology and Oncology

## 2022-11-19 ENCOUNTER — Other Ambulatory Visit: Payer: Self-pay

## 2022-11-19 VITALS — BP 136/87 | HR 90 | Temp 97.3°F | Resp 17 | Wt 160.9 lb

## 2022-11-19 DIAGNOSIS — C50812 Malignant neoplasm of overlapping sites of left female breast: Secondary | ICD-10-CM | POA: Diagnosis not present

## 2022-11-19 DIAGNOSIS — Z170 Estrogen receptor positive status [ER+]: Secondary | ICD-10-CM

## 2022-11-19 NOTE — Assessment & Plan Note (Addendum)
06/13/2018:Bilateral mastectomies: Left: ILC, grade 2, 5.4 cm, LCIS, margins negative, lymphovascular invasion present, 2/7 lymph nodes positive with extranodal extension (1 additional lymph node had isolated tumor cells), right mastectomy benign; T3N1a ER 100%, PR 10%, HER-2 -1+, Ki-67 10 to 15%, stage Ib MammaPrint: Low risk, luminal type a Adjuvant radiation therapy: 08/14/2018- MRI back: L4 and L5 small lesions could not rule out metastatic disease.  PET CT negative for bone metastases.   Treatment: Tamoxifen 20 mg daily Started 11/07/2018-stopped 01/01/2019 due to profound depression/anxiety Couldn't tolerate Anastrozole   10/28/2018 PET CT scan: No evidence of metastatic disease, no hypermetabolism at L4-L5 vertebral bodies to correspond to the MRI findings.   CT abdomen 06/30/2019: No findings to suggest the cause of abdominal discomfort.  No metastatic disease Osteoporosis: T score -3.1   Breast cancer surveillance: Palpable chest wall nodule:  11/10/2022: Ultrasound: No suspicious masses 11/16/2022: CT chest: No findings concerning for metastatic disease.  No chest wall tumor  I reassured her that based upon the imaging studies there does not appear to be any palpable abnormalities at the site of concern.  Return to clinic in 1 year

## 2022-12-14 ENCOUNTER — Other Ambulatory Visit: Payer: Self-pay | Admitting: Nurse Practitioner

## 2022-12-14 DIAGNOSIS — F5105 Insomnia due to other mental disorder: Secondary | ICD-10-CM

## 2022-12-14 DIAGNOSIS — F411 Generalized anxiety disorder: Secondary | ICD-10-CM

## 2022-12-14 DIAGNOSIS — F331 Major depressive disorder, recurrent, moderate: Secondary | ICD-10-CM

## 2022-12-17 MED ORDER — TEMAZEPAM 30 MG PO CAPS: ORAL_CAPSULE | ORAL | 0 refills | Status: DC

## 2022-12-17 NOTE — Addendum Note (Signed)
Addended by: Tonny Bollman on: 12/17/2022 08:08 AM   Modules accepted: Orders

## 2022-12-17 NOTE — Addendum Note (Signed)
Addended by: Tonny Bollman on: 12/17/2022 08:06 AM   Modules accepted: Orders

## 2022-12-17 NOTE — Addendum Note (Signed)
Addended by: Saralyn Pilar on: 12/17/2022 09:12 AM   Modules accepted: Orders

## 2022-12-18 ENCOUNTER — Telehealth: Payer: Self-pay

## 2022-12-18 DIAGNOSIS — K219 Gastro-esophageal reflux disease without esophagitis: Secondary | ICD-10-CM

## 2022-12-18 MED ORDER — OMEPRAZOLE 40 MG PO CPDR: 40.0000 mg | DELAYED_RELEASE_CAPSULE | Freq: Every day | ORAL | 0 refills | Status: DC

## 2022-12-18 NOTE — Telephone Encounter (Signed)
Refill sent.

## 2022-12-18 NOTE — Telephone Encounter (Signed)
Pt is requesting 100 day supply as requested by insurance.   omeprazole (PRILOSEC) 40 MG capsule   Pharmacy: [facility] [address] [employer] [facility], [place] - [address]   LOV 09/25/22 ROV 01/24/23

## 2022-12-19 DIAGNOSIS — C50912 Malignant neoplasm of unspecified site of left female breast: Secondary | ICD-10-CM | POA: Diagnosis not present

## 2022-12-19 DIAGNOSIS — Z9011 Acquired absence of right breast and nipple: Secondary | ICD-10-CM | POA: Diagnosis not present

## 2022-12-20 ENCOUNTER — Encounter: Payer: Self-pay | Admitting: Hematology and Oncology

## 2022-12-21 ENCOUNTER — Encounter: Payer: Self-pay | Admitting: Nurse Practitioner

## 2022-12-21 DIAGNOSIS — K219 Gastro-esophageal reflux disease without esophagitis: Secondary | ICD-10-CM

## 2022-12-24 MED ORDER — OMEPRAZOLE 40 MG PO CPDR
40.0000 mg | DELAYED_RELEASE_CAPSULE | Freq: Every day | ORAL | 0 refills | Status: DC
Start: 2022-12-24 — End: 2023-04-01

## 2022-12-25 DIAGNOSIS — C50912 Malignant neoplasm of unspecified site of left female breast: Secondary | ICD-10-CM | POA: Diagnosis not present

## 2022-12-25 DIAGNOSIS — Z9011 Acquired absence of right breast and nipple: Secondary | ICD-10-CM | POA: Diagnosis not present

## 2023-01-13 ENCOUNTER — Encounter: Payer: Self-pay | Admitting: Nurse Practitioner

## 2023-01-13 DIAGNOSIS — F411 Generalized anxiety disorder: Secondary | ICD-10-CM

## 2023-01-13 DIAGNOSIS — F331 Major depressive disorder, recurrent, moderate: Secondary | ICD-10-CM

## 2023-01-13 DIAGNOSIS — F5105 Insomnia due to other mental disorder: Secondary | ICD-10-CM

## 2023-01-14 ENCOUNTER — Other Ambulatory Visit: Payer: Self-pay

## 2023-01-14 DIAGNOSIS — Z130 Encounter for screening for diseases of the blood and blood-forming organs and certain disorders involving the immune mechanism: Secondary | ICD-10-CM

## 2023-01-14 DIAGNOSIS — Z0000 Encounter for general adult medical examination without abnormal findings: Secondary | ICD-10-CM

## 2023-01-14 MED ORDER — TEMAZEPAM 30 MG PO CAPS
ORAL_CAPSULE | ORAL | 0 refills | Status: DC
Start: 2023-01-14 — End: 2023-04-01

## 2023-01-14 MED ORDER — BUPROPION HCL ER (XL) 150 MG PO TB24
150.0000 mg | ORAL_TABLET | Freq: Every day | ORAL | 1 refills | Status: DC
Start: 2023-01-14 — End: 2023-08-05

## 2023-01-16 ENCOUNTER — Other Ambulatory Visit: Payer: PPO

## 2023-01-16 DIAGNOSIS — Z1321 Encounter for screening for nutritional disorder: Secondary | ICD-10-CM | POA: Diagnosis not present

## 2023-01-16 DIAGNOSIS — Z13228 Encounter for screening for other metabolic disorders: Secondary | ICD-10-CM | POA: Diagnosis not present

## 2023-01-16 DIAGNOSIS — Z130 Encounter for screening for diseases of the blood and blood-forming organs and certain disorders involving the immune mechanism: Secondary | ICD-10-CM

## 2023-01-16 DIAGNOSIS — Z0000 Encounter for general adult medical examination without abnormal findings: Secondary | ICD-10-CM | POA: Diagnosis not present

## 2023-01-16 DIAGNOSIS — Z1329 Encounter for screening for other suspected endocrine disorder: Secondary | ICD-10-CM | POA: Diagnosis not present

## 2023-01-17 ENCOUNTER — Encounter: Payer: Self-pay | Admitting: Family Medicine

## 2023-01-17 LAB — CBC WITH DIFFERENTIAL/PLATELET
Basophils Absolute: 0 10*3/uL (ref 0.0–0.2)
Basos: 1 %
EOS (ABSOLUTE): 0.1 10*3/uL (ref 0.0–0.4)
Eos: 2 %
Hematocrit: 42.6 % (ref 34.0–46.6)
Hemoglobin: 14.2 g/dL (ref 11.1–15.9)
Immature Grans (Abs): 0 10*3/uL (ref 0.0–0.1)
Immature Granulocytes: 0 %
Lymphocytes Absolute: 1.4 10*3/uL (ref 0.7–3.1)
Lymphs: 37 %
MCH: 31.2 pg (ref 26.6–33.0)
MCHC: 33.3 g/dL (ref 31.5–35.7)
MCV: 94 fL (ref 79–97)
Monocytes Absolute: 0.3 10*3/uL (ref 0.1–0.9)
Monocytes: 7 %
Neutrophils Absolute: 2 10*3/uL (ref 1.4–7.0)
Neutrophils: 53 %
Platelets: 201 10*3/uL (ref 150–450)
RBC: 4.55 x10E6/uL (ref 3.77–5.28)
RDW: 12.7 % (ref 11.7–15.4)
WBC: 3.8 10*3/uL (ref 3.4–10.8)

## 2023-01-17 LAB — COMPREHENSIVE METABOLIC PANEL
ALT: 15 IU/L (ref 0–32)
AST: 14 IU/L (ref 0–40)
Albumin/Globulin Ratio: 2 (ref 1.2–2.2)
Albumin: 4.3 g/dL (ref 3.9–4.9)
Alkaline Phosphatase: 50 IU/L (ref 44–121)
BUN/Creatinine Ratio: 13 (ref 12–28)
BUN: 11 mg/dL (ref 8–27)
Bilirubin Total: 0.4 mg/dL (ref 0.0–1.2)
CO2: 24 mmol/L (ref 20–29)
Calcium: 9.2 mg/dL (ref 8.7–10.3)
Chloride: 104 mmol/L (ref 96–106)
Creatinine, Ser: 0.83 mg/dL (ref 0.57–1.00)
Globulin, Total: 2.2 g/dL (ref 1.5–4.5)
Glucose: 93 mg/dL (ref 70–99)
Potassium: 4.3 mmol/L (ref 3.5–5.2)
Sodium: 142 mmol/L (ref 134–144)
Total Protein: 6.5 g/dL (ref 6.0–8.5)
eGFR: 78 mL/min/{1.73_m2} (ref >59)

## 2023-01-17 LAB — HEMOGLOBIN A1C
Est. average glucose Bld gHb Est-mCnc: 123 mg/dL
Hgb A1c MFr Bld: 5.9 % — ABNORMAL HIGH (ref 4.8–5.6)

## 2023-01-17 LAB — LIPID PANEL
Chol/HDL Ratio: 3.4 ratio (ref 0.0–4.4)
Cholesterol, Total: 207 mg/dL — ABNORMAL HIGH (ref 100–199)
HDL: 61 mg/dL (ref >39)
LDL Chol Calc (NIH): 123 mg/dL — ABNORMAL HIGH (ref 0–99)
Triglycerides: 128 mg/dL (ref 0–149)
VLDL Cholesterol Cal: 23 mg/dL (ref 5–40)

## 2023-01-17 LAB — TSH: TSH: 2.43 u[IU]/mL (ref 0.450–4.500)

## 2023-01-21 ENCOUNTER — Telehealth (INDEPENDENT_AMBULATORY_CARE_PROVIDER_SITE_OTHER): Payer: PPO | Admitting: Family Medicine

## 2023-01-21 DIAGNOSIS — E7800 Pure hypercholesterolemia, unspecified: Secondary | ICD-10-CM

## 2023-01-21 MED ORDER — SIMVASTATIN 10 MG PO TABS
10.0000 mg | ORAL_TABLET | Freq: Every day | ORAL | 2 refills | Status: DC
Start: 2023-01-21 — End: 2023-03-22

## 2023-01-21 NOTE — Telephone Encounter (Signed)
Called the patient to review lab work and answer questions.  Patient has developed bilateral myalgias and arthralgias while taking rosuvastatin.  We discussed switching to simvastatin, patient is agreeable.  Sent prescription for simvastatin 10 mg daily.  If she does not tolerate this well, consider Livalo for side effect profile.  It is important that we keep cholesterol levels low given history of cancer.  Discussed slight downward trend in AST/ALT likely can be attributed to her decrease in rosuvastatin prior to her lab and office appointments.  White blood cells decreased from 6.3-3.8.  Remains within normal limits, but we will need to monitor.  I spent 10 minutes on the phone with the patient reviewing lab results and answering questions.

## 2023-01-24 ENCOUNTER — Encounter: Payer: PPO | Admitting: Family Medicine

## 2023-01-24 ENCOUNTER — Ambulatory Visit (INDEPENDENT_AMBULATORY_CARE_PROVIDER_SITE_OTHER): Payer: PPO

## 2023-01-24 VITALS — Ht 65.0 in | Wt 157.0 lb

## 2023-01-24 DIAGNOSIS — Z1159 Encounter for screening for other viral diseases: Secondary | ICD-10-CM

## 2023-01-24 DIAGNOSIS — Z0000 Encounter for general adult medical examination without abnormal findings: Secondary | ICD-10-CM

## 2023-01-24 NOTE — Addendum Note (Signed)
Addended by: Saralyn Pilar on: 01/24/2023 11:57 AM   Modules accepted: Orders

## 2023-01-24 NOTE — Patient Instructions (Addendum)
Dawn Booker , Thank you for taking time to come for your Medicare Wellness Visit. I appreciate your ongoing commitment to your health goals. Please review the following plan we discussed and let me know if I can assist you in the future.   These are the goals we discussed:  Goals       Increase physical activity (pt-stated)      Lose weight.        This is a list of the screening recommended for you and due dates:  Health Maintenance  Topic Date Due   COVID-19 Vaccine (4 - 2023-24 season) 02/09/2023*   Hepatitis C Screening: USPSTF Recommendation to screen - Ages 18-79 yo.  01/24/2024*   Flu Shot  04/04/2023   Medicare Annual Wellness Visit  01/24/2024   DTaP/Tdap/Td vaccine (2 - Td or Tdap) 06/14/2024   Colon Cancer Screening  07/23/2029   Pneumonia Vaccine  Completed   DEXA scan (bone density measurement)  Completed   Zoster (Shingles) Vaccine  Completed   HPV Vaccine  Aged Out   Mammogram  Discontinued  *Topic was postponed. The date shown is not the original due date.    Advanced directives: Advance directive discussed with you today. Even though you declined this today, please call our office should you change your mind, and we can give you the proper paperwork for you to fill out.   Conditions/risks identified: None  Next appointment: Follow up in one year for your annual wellness visit    Preventive Care 65 Years and Older, Female Preventive care refers to lifestyle choices and visits with your health care provider that can promote health and wellness. What does preventive care include? A yearly physical exam. This is also called an annual well check. Dental exams once or twice a year. Routine eye exams. Ask your health care provider how often you should have your eyes checked. Personal lifestyle choices, including: Daily care of your teeth and gums. Regular physical activity. Eating a healthy diet. Avoiding tobacco and drug use. Limiting alcohol use. Practicing  safe sex. Taking low-dose aspirin every day. Taking vitamin and mineral supplements as recommended by your health care provider. What happens during an annual well check? The services and screenings done by your health care provider during your annual well check will depend on your age, overall health, lifestyle risk factors, and family history of disease. Counseling  Your health care provider may ask you questions about your: Alcohol use. Tobacco use. Drug use. Emotional well-being. Home and relationship well-being. Sexual activity. Eating habits. History of falls. Memory and ability to understand (cognition). Work and work Astronomer. Reproductive health. Screening  You may have the following tests or measurements: Height, weight, and BMI. Blood pressure. Lipid and cholesterol levels. These may be checked every 5 years, or more frequently if you are over 35 years old. Skin check. Lung cancer screening. You may have this screening every year starting at age 88 if you have a 30-pack-year history of smoking and currently smoke or have quit within the past 15 years. Fecal occult blood test (FOBT) of the stool. You may have this test every year starting at age 69. Flexible sigmoidoscopy or colonoscopy. You may have a sigmoidoscopy every 5 years or a colonoscopy every 10 years starting at age 41. Hepatitis C blood test. Hepatitis B blood test. Sexually transmitted disease (STD) testing. Diabetes screening. This is done by checking your blood sugar (glucose) after you have not eaten for a while (fasting). You may  have this done every 1-3 years. Bone density scan. This is done to screen for osteoporosis. You may have this done starting at age 11. Mammogram. This may be done every 1-2 years. Talk to your health care provider about how often you should have regular mammograms. Talk with your health care provider about your test results, treatment options, and if necessary, the need for more  tests. Vaccines  Your health care provider may recommend certain vaccines, such as: Influenza vaccine. This is recommended every year. Tetanus, diphtheria, and acellular pertussis (Tdap, Td) vaccine. You may need a Td booster every 10 years. Zoster vaccine. You may need this after age 23. Pneumococcal 13-valent conjugate (PCV13) vaccine. One dose is recommended after age 61. Pneumococcal polysaccharide (PPSV23) vaccine. One dose is recommended after age 13. Talk to your health care provider about which screenings and vaccines you need and how often you need them. This information is not intended to replace advice given to you by your health care provider. Make sure you discuss any questions you have with your health care provider. Document Released: 09/16/2015 Document Revised: 05/09/2016 Document Reviewed: 06/21/2015 Elsevier Interactive Patient Education  2017 [employer].  Fall Prevention in the Home Falls can cause injuries. They can happen to people of all ages. There are many things you can do to make your home safe and to help prevent falls. What can I do on the outside of my home? Regularly fix the edges of walkways and driveways and fix any cracks. Remove anything that might make you trip as you walk through a door, such as a raised step or threshold. Trim any bushes or trees on the path to your home. Use bright outdoor lighting. Clear any walking paths of anything that might make someone trip, such as rocks or tools. Regularly check to see if handrails are loose or broken. Make sure that both sides of any steps have handrails. Any raised decks and porches should have guardrails on the edges. Have any leaves, snow, or ice cleared regularly. Use sand or salt on walking paths during winter. Clean up any spills in your garage right away. This includes oil or grease spills. What can I do in the bathroom? Use night lights. Install grab bars by the toilet and in the tub and shower.  Do not use towel bars as grab bars. Use non-skid mats or decals in the tub or shower. If you need to sit down in the shower, use a plastic, non-slip stool. Keep the floor dry. Clean up any water that spills on the floor as soon as it happens. Remove soap buildup in the tub or shower regularly. Attach bath mats securely with double-sided non-slip rug tape. Do not have throw rugs and other things on the floor that can make you trip. What can I do in the bedroom? Use night lights. Make sure that you have a light by your bed that is easy to reach. Do not use any sheets or blankets that are too big for your bed. They should not hang down onto the floor. Have a firm chair that has side arms. You can use this for support while you get dressed. Do not have throw rugs and other things on the floor that can make you trip. What can I do in the kitchen? Clean up any spills right away. Avoid walking on wet floors. Keep items that you use a lot in easy-to-reach places. If you need to reach something above you, use a strong step  stool that has a grab bar. Keep electrical cords out of the way. Do not use floor polish or wax that makes floors slippery. If you must use wax, use non-skid floor wax. Do not have throw rugs and other things on the floor that can make you trip. What can I do with my stairs? Do not leave any items on the stairs. Make sure that there are handrails on both sides of the stairs and use them. Fix handrails that are broken or loose. Make sure that handrails are as long as the stairways. Check any carpeting to make sure that it is firmly attached to the stairs. Fix any carpet that is loose or worn. Avoid having throw rugs at the top or bottom of the stairs. If you do have throw rugs, attach them to the floor with carpet tape. Make sure that you have a light switch at the top of the stairs and the bottom of the stairs. If you do not have them, ask someone to add them for you. What else  can I do to help prevent falls? Wear shoes that: Do not have high heels. Have rubber bottoms. Are comfortable and fit you well. Are closed at the toe. Do not wear sandals. If you use a stepladder: Make sure that it is fully opened. Do not climb a closed stepladder. Make sure that both sides of the stepladder are locked into place. Ask someone to hold it for you, if possible. Clearly mark and make sure that you can see: Any grab bars or handrails. First and last steps. Where the edge of each step is. Use tools that help you move around (mobility aids) if they are needed. These include: Canes. Walkers. Scooters. Crutches. Turn on the lights when you go into a dark area. Replace any light bulbs as soon as they burn out. Set up your furniture so you have a clear path. Avoid moving your furniture around. If any of your floors are uneven, fix them. If there are any pets around you, be aware of where they are. Review your medicines with your doctor. Some medicines can make you feel dizzy. This can increase your chance of falling. Ask your doctor what other things that you can do to help prevent falls. This information is not intended to replace advice given to you by your health care provider. Make sure you discuss any questions you have with your health care provider. Document Released: 06/16/2009 Document Revised: 01/26/2016 Document Reviewed: 09/24/2014 Elsevier Interactive Patient Education  2017 [employer].

## 2023-01-24 NOTE — Progress Notes (Signed)
Subjective:   Dawn Booker is a 67 y.o. female who presents for Medicare Annual (Subsequent) preventive examination.  Review of Systems    Virtual Visit via Telephone Note  I connected with  Dawn Booker on 01/24/23 at 10:00 AM EDT by telephone and verified that I am speaking with the correct person using two identifiers.  Location: Patient: Home Provider: Office Persons participating in the virtual visit: patient/Nurse Health Advisor   I discussed the limitations, risks, security and privacy concerns of performing an evaluation and management service by telephone and the availability of in person appointments. The patient expressed understanding and agreed to proceed.  Interactive audio and video telecommunications were attempted between this nurse and patient, however failed, due to patient having technical difficulties OR patient did not have access to video capability.  We continued and completed visit with audio only.  Some vital signs may be absent or patient reported.   Tillie Rung, LPN  Cardiac Risk Factors include: advanced age (>70men, >15 women)     Objective:    Today's Vitals   01/24/23 0947  Weight: 157 lb (71.2 kg)  Height: 5\' 5"  (1.651 m)   Body mass index is 26.13 kg/m.     01/24/2023    9:55 AM 09/18/2022    1:32 PM 07/09/2022   12:49 PM 03/28/2022    3:06 PM 12/08/2019    4:01 PM 09/23/2019    4:08 PM 06/09/2019   10:49 AM  Advanced Directives  Does Patient Have a Medical Advance Directive? No Yes Yes No No No Yes  Type of Science writer of [employer] Power of Attorney  Would patient like information on creating a medical advance directive? No - Patient declined   No - Patient declined No - Patient declined No - Patient declined     Current Medications (verified) Outpatient Encounter Medications as of 01/24/2023  Medication Sig   acetaminophen (TYLENOL) 500 MG tablet Take 1,000 mg  by mouth at bedtime.   buPROPion (WELLBUTRIN XL) 150 MG 24 hr tablet Take 1 tablet (150 mg total) by mouth daily.   Cholecalciferol (VITAMIN D) 50 MCG (2000 UT) CAPS Take by mouth. 1 capsule daily   Coenzyme Q10 (COQ10) 100 MG CAPS Take 1 capsule by mouth daily.   Cyanocobalamin (VITAMIN B12) 500 MCG TABS Take by mouth. 1 tablet by mouth daily   diphenhydrAMINE (BENADRYL) 25 mg capsule Take 25 mg by mouth at bedtime.   folic acid (FOLVITE) 400 MCG tablet Take 400 mcg by mouth daily.   Magnesium 400 MG TABS Take 400 mg by mouth.   omeprazole (PRILOSEC) 40 MG capsule Take 1 capsule (40 mg total) by mouth daily.   OVER THE COUNTER MEDICATION 1 tablet daily. Vegan Calcium 1000   simvastatin (ZOCOR) 10 MG tablet Take 1 tablet (10 mg total) by mouth at bedtime.   temazepam (RESTORIL) 30 MG capsule TAKE 1 CAPSULE BY MOUTH ONCE DAILY AT NIGHT AT BEDTIME   No facility-administered encounter medications on file as of 01/24/2023.    Allergies (verified) Lamictal [lamotrigine], Cymbalta [duloxetine hcl], Hydrocodone, Mobic [meloxicam], Sulfa antibiotics, Corticosteroids, Morphine, Other, Prednisone, Prozac [fluoxetine hcl], Vortioxetine, and Zoloft [sertraline]   History: Past Medical History:  Diagnosis Date   ADD (attention deficit disorder)    Anxiety    breast ca 2019   Left breast cancer   Depression    GERD (gastroesophageal reflux disease)  Hiatal hernia    History of radiation therapy 08/13/2018- 09/25/2018   Left Chest wall and IM nodes/ 50 Gy in 25 fractions, Left supraclaicular fossa and PAB/ 50 Gy in 25 fractions, Left chest wall scar boost/ 10 Gy in 5 fractions.    Hyperlipemia, mixed    Intermittent palpitations    Malignant neoplasm of overlapping sites of left breast in female, estrogen receptor positive (HCC) 05/06/2018   oncologist-  dr Pamelia Hoit--  Invasive Lobular Cancer, CIS, Stage IB, Grade 2 (pT3,pN1a,cM0), ER+---  s/p  left mastectomy with sln dissections and right  mastectomy (benign)   PONV (postoperative nausea and vomiting)    Right thyroid nodule    Shingles 12/2017   Tennis elbow    Past Surgical History:  Procedure Laterality Date   AXILLARY LYMPH NODE DISSECTION Left 07/09/2018   Procedure: COMPLETION OF LEFT AXILLARY LYMPH NODE DISSECTION;  Surgeon: Chevis Pretty III, MD;  Location: [facility];  Service: General;  Laterality: Left;   BREAST EXCISIONAL BIOPSY Left 1995   benign   CARDIOVASCULAR STRESS TEST  06/21/2017   normal nuclear stress study w/ no ischemia/  normal LV function and wall function, nuclear stress ef 70%   ELBOW SURGERY Left child   MASTECTOMY W/ SENTINEL NODE BIOPSY Left 06/13/2018   MASTECTOMY WITH RADIOACTIVE SEED GUIDED EXCISION AND AXILLARY SENTINEL LYMPH NODE BIOPSY Bilateral 06/13/2018   Procedure: LEFT MASTECTOMY WITH SENTINEL LYMPH NODE MAPPING AND TARGETED NODE DISSECTION AND RIGHT PROPHYLATIC MASTECTOMY;  Surgeon: Griselda Miner, MD;  Location: [facility];  Service: General;  Laterality: Bilateral;   THYROIDECTOMY Left 10-31-2001   dr gerkin @[facility]    TRANSTHORACIC ECHOCARDIOGRAM  06/21/2017   ef 60-65%/  trivial MR (no evidence mvp)   TUBAL LIGATION  yrs ago   VAGINAL HYSTERECTOMY  1990s   Family History  Problem Relation Age of Onset   Hypertension Mother    Heart attack Mother    Breast cancer Mother    Bipolar disorder Father    Breast cancer Maternal Grandmother    Breast cancer Cousin    Social History   Socioeconomic History   Marital status: Married    Spouse name: Not on file   Number of children: 2   Years of education: 12   Highest education level: High school graduate  Occupational History   Not on file  Tobacco Use   Smoking status: Former    Packs/day: 0.25    Years: 0.00    Additional pack years: 0.00    Total pack years: 0.00    Types: Cigarettes    Quit date: 12/05/2017    Years since quitting: 5.1   Smokeless tobacco: Never  Vaping Use   Vaping Use: Never used  Substance and Sexual  Activity   Alcohol use: Not Currently   Drug use: No   Sexual activity: Yes    Partners: Male    Birth control/protection: None  Other Topics Concern   Not on file  Social History Narrative   Lives at home with husband.   Right-handed.   1 cup coffee per day, occasional soda or tea.   Social Determinants of Health   Financial Resource Strain: Low Risk  (01/24/2023)   Overall Financial Resource Strain (CARDIA)    Difficulty of Paying Living Expenses: Not Booker at all  Food Insecurity: No Food Insecurity (01/24/2023)   Hunger Vital Sign    Worried About Running Out of Food in the Last Year: Never true    Ran  Out of Food in the Last Year: Never true  Transportation Needs: No Transportation Needs (01/24/2023)   PRAPARE - Administrator, Civil Service (Medical): No    Lack of Transportation (Non-Medical): No  Physical Activity: Inactive (01/24/2023)   Exercise Vital Sign    Days of Exercise per Week: 0 days    Minutes of Exercise per Session: 0 min  Stress: No Stress Concern Present (01/24/2023)   [employer] of Occupational Health - Occupational Stress Questionnaire    Feeling of Stress : Not at all  Social Connections: Socially Integrated (01/24/2023)   Social Connection and Isolation Panel [NHANES]    Frequency of Communication with Friends and Family: More than three times a week    Frequency of Social Gatherings with Friends and Family: More than three times a week    Attends Religious Services: More than 4 times per year    Active Member of [employer] or Organizations: Yes    Attends Engineer, structural: More than 4 times per year    Marital Status: Married    Tobacco Counseling Counseling given: Not Answered   Clinical Intake:  Pre-visit preparation completed: No  Pain : No/denies pain     BMI - recorded: 26.13 Nutritional Status: BMI 25 -29 Overweight Nutritional Risks: None Diabetes: No  How often do you need to have someone help you when  you read instructions, pamphlets, or other written materials from your doctor or pharmacy?: 1 - Never  Diabetic?  No  Interpreter Needed?: No  Information entered by :: Theresa Mulligan LPN   Activities of Daily Living    01/24/2023    9:54 AM 09/25/2022    1:40 PM  In your present state of health, do you have any difficulty performing the following activities:  Hearing? 0 0  Vision? 0 0  Difficulty concentrating or making decisions? 0 1  Walking or climbing stairs? 0 0  Dressing or bathing? 0 0  Doing errands, shopping? 0 0  Preparing Food and eating ? N   Using the Toilet? N   In the past six months, have you accidently leaked urine? N   Do you have problems with loss of bowel control? N   Managing your Medications? N   Managing your Finances? N   Housekeeping or managing your Housekeeping? N     Patient Care Team: Melida Quitter, PA as PCP - General (Family Medicine) Serena Croissant, MD as Consulting Physician (Hematology and Oncology) Griselda Miner, MD as Consulting Physician (General Surgery) Lonie Peak, MD as Attending Physician (Radiation Oncology) Jomarie Longs, MD as Consulting Physician (Psychiatry) Axel Filler Larna Daughters, NP as Nurse Practitioner (Hematology and Oncology) August Saucer Corrie Mckusick, MD as Consulting Physician (Orthopedic Surgery) Charna Elizabeth, MD as Consulting Physician (Gastroenterology)  Indicate any recent Medical Services you may have received from other than [facility] providers in the past year (date may be approximate).     Assessment:   This is a routine wellness examination for Dawn Booker.  Hearing/Vision screen Hearing Screening - Comments:: Denies hearing difficulties   Vision Screening - Comments:: Wears rx glasses - up to date with routine eye exams with  Dr Jimmey Ralph  Dietary issues and exercise activities discussed: Exercise limited by: None identified   Goals Addressed               This Visit's Progress     Increase physical  activity (pt-stated)        Lose weight.  Depression Screen    09/25/2022    1:39 PM 05/22/2022    2:00 PM 03/23/2022    9:58 AM 01/10/2022    1:25 PM 12/18/2021    2:13 PM 10/11/2021    2:01 PM 09/12/2021    2:33 PM  PHQ 2/9 Scores  PHQ - 2 Score 0 2 2 2 2 2 2   PHQ- 9 Score 3 6 5 6 5 4 5     Fall Risk    01/24/2023    9:54 AM 09/25/2022    1:40 PM 05/22/2022    1:59 PM 03/23/2022    9:57 AM 12/18/2021    2:02 PM  Fall Risk   Falls in the past year? 0 1 0 0 0  Number falls in past yr: 0 0   0  Injury with Fall? 0 0 0 0 0  Risk for fall due to : No Fall Risks  No Fall Risks No Fall Risks No Fall Risks  Follow up Falls prevention discussed Falls evaluation completed Falls evaluation completed;Education provided Falls evaluation completed Falls evaluation completed    FALL RISK PREVENTION PERTAINING TO THE HOME:  Any stairs in or around the home? Yes  If so, are there any without handrails? No  Home free of loose throw rugs in walkways, pet beds, electrical cords, etc? Yes  Adequate lighting in your home to reduce risk of falls? Yes   ASSISTIVE DEVICES UTILIZED TO PREVENT FALLS:  Life alert? No  Use of a cane, walker or w/c? No  Grab bars in the bathroom? Yes Shower chair or bench in shower? Yes Elevated toilet seat or a handicapped toilet? No  TIMED UP AND GO:  Was the test performed? No . Audio Visit  Cognitive Function:        01/24/2023    9:56 AM  6CIT Screen  What Year? 0 points  What month? 0 points  What time? 0 points  Count back from 20 0 points  Months in reverse 0 points  Repeat phrase 0 points  Total Score 0 points    Immunizations Immunization History  Administered Date(s) Administered   Influenza,inj,Quad PF,6+ Mos 06/09/2019, 06/08/2020, 05/17/2021, 09/25/2022   Moderna Sars-Covid-2 Vaccination 11/20/2019, 12/22/2019, 08/11/2020   PNEUMOCOCCAL CONJUGATE-20 05/30/2021   Tdap 06/14/2014   Zoster Recombinat (Shingrix) 06/26/2019,  09/01/2019    TDAP status: Up to date  Flu Vaccine status: Up to date  Pneumococcal vaccine status: Up to date  Covid-19 vaccine status: Completed vaccines  Qualifies for Shingles Vaccine? Yes   Zostavax completed Yes   Shingrix Completed?: Yes  Screening Tests Health Maintenance  Topic Date Due   COVID-19 Vaccine (4 - 2023-24 season) 02/09/2023 (Originally 05/04/2022)   Hepatitis C Screening  01/24/2024 (Originally 06/25/1974)   INFLUENZA VACCINE  04/04/2023   Medicare Annual Wellness (AWV)  01/24/2024   DTaP/Tdap/Td (2 - Td or Tdap) 06/14/2024   COLONOSCOPY (Pts 45-55yrs Insurance coverage will need to be confirmed)  07/23/2029   Pneumonia Vaccine 22+ Years old  Completed   DEXA SCAN  Completed   Zoster Vaccines- Shingrix  Completed   HPV VACCINES  Aged Out   MAMMOGRAM  Discontinued    Health Maintenance  There are no preventive care reminders to display for this patient.   Colorectal cancer screening: Type of screening: Colonoscopy. Completed 07/24/19. Repeat every 10 years    Bone Density status: Completed 05/30/21. Results reflect: Bone density results: OSTEOPOROSIS. Repeat every   years.  Lung Cancer Screening: (Low Dose  CT Chest recommended if Age 14-80 years, 30 pack-year currently smoking OR have quit w/in 15years.) does not qualify.     Additional Screening:  Hepatitis C Screening: does qualify;  Deferred  Vision Screening: Recommended annual ophthalmology exams for early detection of glaucoma and other disorders of the eye. Is the patient up to date with their annual eye exam?  Yes  Who is the provider or what is the name of the office in which the patient attends annual eye exams? Dr Jimmey Ralph If pt is not established with a provider, would they like to be referred to a provider to establish care? No .   Dental Screening: Recommended annual dental exams for proper oral hygiene  Community Resource Referral / Chronic Care Management:  CRR required this  visit?  No   CCM required this visit?  No      Plan:     I have personally reviewed and noted the following in the patient's chart:   Medical and social history Use of alcohol, tobacco or illicit drugs  Current medications and supplements including opioid prescriptions. Patient is not currently taking opioid prescriptions. Functional ability and status Nutritional status Physical activity Advanced directives List of other physicians Hospitalizations, surgeries, and ER visits in previous 12 months Vitals Screenings to include cognitive, depression, and falls Referrals and appointments  In addition, I have reviewed and discussed with patient certain preventive protocols, quality metrics, and best practice recommendations. A written personalized care plan for preventive services as well as general preventive health recommendations were provided to patient.     Tillie Rung, LPN   1/61/0960   Nurse Notes: Patient due Hep-C Screening

## 2023-02-15 DIAGNOSIS — H2513 Age-related nuclear cataract, bilateral: Secondary | ICD-10-CM | POA: Diagnosis not present

## 2023-02-15 DIAGNOSIS — H02834 Dermatochalasis of left upper eyelid: Secondary | ICD-10-CM | POA: Diagnosis not present

## 2023-02-15 DIAGNOSIS — H5211 Myopia, right eye: Secondary | ICD-10-CM | POA: Diagnosis not present

## 2023-02-15 DIAGNOSIS — H40013 Open angle with borderline findings, low risk, bilateral: Secondary | ICD-10-CM | POA: Diagnosis not present

## 2023-02-15 DIAGNOSIS — Z83511 Family history of glaucoma: Secondary | ICD-10-CM | POA: Diagnosis not present

## 2023-02-15 DIAGNOSIS — H524 Presbyopia: Secondary | ICD-10-CM | POA: Diagnosis not present

## 2023-02-15 DIAGNOSIS — H52221 Regular astigmatism, right eye: Secondary | ICD-10-CM | POA: Diagnosis not present

## 2023-03-22 ENCOUNTER — Encounter: Payer: Self-pay | Admitting: Family Medicine

## 2023-03-22 ENCOUNTER — Ambulatory Visit (INDEPENDENT_AMBULATORY_CARE_PROVIDER_SITE_OTHER): Payer: PPO | Admitting: Family Medicine

## 2023-03-22 VITALS — BP 116/77 | HR 80 | Ht 65.0 in | Wt 152.8 lb

## 2023-03-22 DIAGNOSIS — R0683 Snoring: Secondary | ICD-10-CM

## 2023-03-22 DIAGNOSIS — R0681 Apnea, not elsewhere classified: Secondary | ICD-10-CM | POA: Diagnosis not present

## 2023-03-22 DIAGNOSIS — F411 Generalized anxiety disorder: Secondary | ICD-10-CM

## 2023-03-22 DIAGNOSIS — F331 Major depressive disorder, recurrent, moderate: Secondary | ICD-10-CM | POA: Diagnosis not present

## 2023-03-22 DIAGNOSIS — H04123 Dry eye syndrome of bilateral lacrimal glands: Secondary | ICD-10-CM | POA: Diagnosis not present

## 2023-03-22 DIAGNOSIS — H2513 Age-related nuclear cataract, bilateral: Secondary | ICD-10-CM | POA: Diagnosis not present

## 2023-03-22 DIAGNOSIS — M25551 Pain in right hip: Secondary | ICD-10-CM | POA: Diagnosis not present

## 2023-03-22 DIAGNOSIS — F5105 Insomnia due to other mental disorder: Secondary | ICD-10-CM

## 2023-03-22 DIAGNOSIS — H40013 Open angle with borderline findings, low risk, bilateral: Secondary | ICD-10-CM | POA: Diagnosis not present

## 2023-03-22 DIAGNOSIS — E7800 Pure hypercholesterolemia, unspecified: Secondary | ICD-10-CM | POA: Diagnosis not present

## 2023-03-22 MED ORDER — SIMVASTATIN 10 MG PO TABS
10.0000 mg | ORAL_TABLET | Freq: Every day | ORAL | 2 refills | Status: DC
Start: 2023-03-22 — End: 2023-05-28

## 2023-03-22 NOTE — Progress Notes (Signed)
Established Patient Office Visit  Subjective   Patient ID: Dawn Booker, female    DOB: 20-May-1956  Age: 66 y.o. MRN: [id number]  Chief Complaint  Patient presents with   Medical Management of Chronic Issues    HPI Dawn Booker is a 67 y.o. female presenting today for follow up of hyperlipidemia, sleep, mood. Hyperlipidemia: tolerating simvastatin well with no myalgias or significant side effects. Currently consuming a low fat diet.  She is staying active. The 10-year ASCVD risk score (Arnett DK, et al., 2019) is: 5% Insomnia: She struggles with sleep induction and sleep maintenance and has for many years. She is taking temazepam, Benadryl, 2 Tylenol PM, and magnesium nightly, denies side effects. She has never had a sleep study done before in all her years of suffering from insomnia. Mood: Patient is here to follow up for depression and anxiety, currently managing with bupropion. Taking medication without side effects, reports excellent compliance with treatment. Denies mood changes or SI/HI. She feels mood is stable since last visit.     03/22/2023   10:10 AM 09/25/2022    1:39 PM 05/22/2022    2:00 PM  Depression screen PHQ 2/9  Decreased Interest 0 0 1  Down, Depressed, Hopeless 0 0 1  PHQ - 2 Score 0 0 2  Altered sleeping 1 1 1   Tired, decreased energy 0 1 1  Change in appetite 0 0 0  Feeling bad or failure about yourself  0 0 1  Trouble concentrating 0 1 1  Moving slowly or fidgety/restless 0 0 0  Suicidal thoughts 0 0 0  PHQ-9 Score 1 3 6   Difficult doing work/chores Not difficult at all  Somewhat difficult       09/25/2022    1:39 PM 03/23/2022    9:58 AM 01/10/2022    1:26 PM 12/18/2021    2:13 PM  GAD 7 : Generalized Anxiety Score  Nervous, Anxious, on Edge 1 1 1 1   Control/stop worrying 1 1 1 1   Worry too much - different things 1 1 1 1   Trouble relaxing 1 1 1 1   Restless 0 0 0 0  Easily annoyed or irritable 0 1 1 1   Afraid - awful might happen 0 1 0 0   Total GAD 7 Score 4 6 5 5   Anxiety Difficulty  Somewhat difficult  Somewhat difficult    ROS Negative unless otherwise noted in HPI   Objective:     BP 116/77   Pulse 80   Ht 5\' 5"  (1.651 m)   Wt 152 lb 12.8 oz (69.3 kg)   SpO2 99%   BMI 25.43 kg/m   Physical Exam Constitutional:      General: She is not in acute distress.    Appearance: Normal appearance.  HENT:     Head: Normocephalic and atraumatic.  Cardiovascular:     Rate and Rhythm: Normal rate and regular rhythm.     Heart sounds: No murmur heard.    No friction rub. No gallop.  Pulmonary:     Effort: Pulmonary effort is normal. No respiratory distress.     Breath sounds: No wheezing, rhonchi or rales.  Musculoskeletal:     Comments: Limited AROM and PROM due to pain on internal rotation of right hip. Lower extremity strength 5/5 bilaterally. No tenderness to palpation of right hip or leg.  Skin:    General: Skin is warm and dry.  Neurological:     Mental Status: She is  alert and oriented to person, place, and time.      Assessment & Plan:  MDD (major depressive disorder), recurrent episode, moderate (HCC)  GAD (generalized anxiety disorder) Assessment & Plan: Stable, GAD-7 score 2. Continue Wellbutrin 150 mg. Will continue to monitor.   Hypercholesterolemia Assessment & Plan: Last lipid panel: LDL 123, HDL 61, triglycerides 128. Continue simvastatin 10 mg daily. Will recheck lipids at next appointment to assess need for any changes.  Orders: -     Simvastatin; Take 1 tablet (10 mg total) by mouth at bedtime.  Dispense: 100 tablet; Refill: 2  Right hip pain -     DG HIP UNILAT W OR W/O PELVIS 2-3 VIEWS RIGHT; Future  Insomnia due to mental condition Assessment & Plan: Patient has never been evaluated with sleep study. Her husband has noted that she snores and has episodes of apnea during the night. Her current regimen of temazepam, Benadryl, 2 Tylenol PM especially now that she is over 54 years  old puts her at increased risk of adverse effects including cognitive decline, respiratory depression, hypotension, tachycardia. She has also been taking Restoril nightly for so long that abrupt discontinuing may cause withdrawal, so will need to taper off gradually. She is agreeable to home sleep study to assess for sleep apnea and other sleep disorders. Depending on results, may suggest seeing sleep specialist for further management.  Orders: -     Home sleep test; Future  Snoring -     Home sleep test; Future  Apnea -     Home sleep test; Future    Return in about 3 months (around 06/22/2023) for follow-up for sleep and pain, fasting blood work 1 week before.    Melida Quitter, PA

## 2023-03-22 NOTE — Assessment & Plan Note (Signed)
Stable, GAD-7 score 2. Continue Wellbutrin 150 mg. Will continue to monitor.

## 2023-03-22 NOTE — Assessment & Plan Note (Signed)
Last lipid panel: LDL 123, HDL 61, triglycerides 128. Continue simvastatin 10 mg daily. Will recheck lipids at next appointment to assess need for any changes.

## 2023-03-22 NOTE — Assessment & Plan Note (Signed)
Patient has never been evaluated with sleep study. Her husband has noted that she snores and has episodes of apnea during the night. Her current regimen of temazepam, Benadryl, 2 Tylenol PM especially now that she is over 67 years old puts her at increased risk of adverse effects including cognitive decline, respiratory depression, hypotension, tachycardia. She has also been taking Restoril nightly for so long that abrupt discontinuing may cause withdrawal, so will need to taper off gradually. She is agreeable to home sleep study to assess for sleep apnea and other sleep disorders. Depending on results, may suggest seeing sleep specialist for further management.

## 2023-03-25 ENCOUNTER — Encounter: Payer: Self-pay | Admitting: Family Medicine

## 2023-03-31 ENCOUNTER — Other Ambulatory Visit: Payer: Self-pay | Admitting: Family Medicine

## 2023-03-31 DIAGNOSIS — K219 Gastro-esophageal reflux disease without esophagitis: Secondary | ICD-10-CM

## 2023-03-31 DIAGNOSIS — F5105 Insomnia due to other mental disorder: Secondary | ICD-10-CM

## 2023-03-31 DIAGNOSIS — F411 Generalized anxiety disorder: Secondary | ICD-10-CM

## 2023-03-31 DIAGNOSIS — F331 Major depressive disorder, recurrent, moderate: Secondary | ICD-10-CM

## 2023-04-01 ENCOUNTER — Encounter: Payer: Self-pay | Admitting: Family Medicine

## 2023-04-18 ENCOUNTER — Encounter (INDEPENDENT_AMBULATORY_CARE_PROVIDER_SITE_OTHER): Payer: Self-pay

## 2023-04-24 DIAGNOSIS — M25551 Pain in right hip: Secondary | ICD-10-CM | POA: Diagnosis not present

## 2023-04-24 DIAGNOSIS — M79644 Pain in right finger(s): Secondary | ICD-10-CM | POA: Diagnosis not present

## 2023-04-24 DIAGNOSIS — M5136 Other intervertebral disc degeneration, lumbar region: Secondary | ICD-10-CM | POA: Diagnosis not present

## 2023-04-30 DIAGNOSIS — C50912 Malignant neoplasm of unspecified site of left female breast: Secondary | ICD-10-CM | POA: Diagnosis not present

## 2023-04-30 DIAGNOSIS — Z170 Estrogen receptor positive status [ER+]: Secondary | ICD-10-CM | POA: Diagnosis not present

## 2023-05-13 ENCOUNTER — Encounter: Payer: Self-pay | Admitting: Family Medicine

## 2023-05-13 ENCOUNTER — Ambulatory Visit (INDEPENDENT_AMBULATORY_CARE_PROVIDER_SITE_OTHER): Payer: PPO | Admitting: Family Medicine

## 2023-05-13 VITALS — BP 124/78 | HR 73 | Temp 98.3°F | Ht 65.0 in | Wt 151.4 lb

## 2023-05-13 DIAGNOSIS — F331 Major depressive disorder, recurrent, moderate: Secondary | ICD-10-CM

## 2023-05-13 DIAGNOSIS — Z8639 Personal history of other endocrine, nutritional and metabolic disease: Secondary | ICD-10-CM

## 2023-05-13 DIAGNOSIS — Z7689 Persons encountering health services in other specified circumstances: Secondary | ICD-10-CM

## 2023-05-13 DIAGNOSIS — Z1159 Encounter for screening for other viral diseases: Secondary | ICD-10-CM | POA: Diagnosis not present

## 2023-05-13 DIAGNOSIS — E348 Other specified endocrine disorders: Secondary | ICD-10-CM | POA: Diagnosis not present

## 2023-05-13 DIAGNOSIS — E559 Vitamin D deficiency, unspecified: Secondary | ICD-10-CM | POA: Diagnosis not present

## 2023-05-13 DIAGNOSIS — K219 Gastro-esophageal reflux disease without esophagitis: Secondary | ICD-10-CM | POA: Diagnosis not present

## 2023-05-13 DIAGNOSIS — F5105 Insomnia due to other mental disorder: Secondary | ICD-10-CM | POA: Diagnosis not present

## 2023-05-13 DIAGNOSIS — F411 Generalized anxiety disorder: Secondary | ICD-10-CM | POA: Diagnosis not present

## 2023-05-13 DIAGNOSIS — E7800 Pure hypercholesterolemia, unspecified: Secondary | ICD-10-CM | POA: Diagnosis not present

## 2023-05-13 DIAGNOSIS — R252 Cramp and spasm: Secondary | ICD-10-CM | POA: Diagnosis not present

## 2023-05-13 LAB — POCT URINE DRUG SCREEN
POC Amphetamine UR: NOT DETECTED
POC BENZODIAZEPINES UR: POSITIVE — AB
POC Barbiturate UR: NOT DETECTED — AB
POC Cocaine UR: NOT DETECTED
POC Ecstasy UR: NOT DETECTED
POC Marijuana UR: NOT DETECTED
POC Methadone UR: NOT DETECTED
POC Methamphetamine UR: NOT DETECTED
POC Opiate Ur: NOT DETECTED
POC Oxycodone UR: NOT DETECTED
POC PHENCYCLIDINE UR: NOT DETECTED
POC TRICYCLICS UR: NOT DETECTED
URINE TEMPERATURE: 94 [degF] (ref 90.0–100.0)

## 2023-05-13 NOTE — Progress Notes (Unsigned)
New Patient Office Visit  Subjective    Patient ID: Dawn Booker, female    DOB: 05/30/1956  Age: 67 y.o. MRN: [id number]  CC:  Chief Complaint  Patient presents with   Establish Care    Pt is here today to Est.Care Pt reports she has been unhappy with prior PCP Dawn [facility] Pt would like to she is currently taking  simvastatin (ZOCOR) 10 MG, pt reports nightmares for 2 months for a couple of months. Pt would like to do lazy fit and wonder if this is okay?  HX of [place] Pt reports she has been on edge. Pt reports she has been on edge.    HPI Dawn [facility] presents to establish care with new provider.   Patients previous primary care provider was with [facility], [place], [place]. Last seen 03/22/2023. Patient reports she has been unhappy with her care and wanted to transition to another provider.   Specialist: Dawn Booker: Dawn [facility] with Dawn. [facility]-Dawn. Elmon Else  [facility] with Dawn. [facility]. [facility] Dawn Anes, NP  [facility] with Dawn. Charna [facility]  [facility]-(previously, one visit for right hip)  [facility] with Dawn. Dietrich [facility] (previously seen-last viist was 01/09/2022)   Depression/Anxiety: Chronic. Patient is taking Wellbutrin 150mg /24hr daily. Patient reports this medication is effective. In the past, patient has been on several other psychiatric medications.   GERD: Chronic. Patient is prescribed Omeprazole 40mg  daily. She reports this is effective. She reports she previous came off medication. But, when she had her endoscopy, she was recommended to restart medication.   Hyperlipidemia: Chronic. Patient is taking Simvastatin 10mg  daily. Patient was previously on Rosuvastatin, but has changed to Simvastatin. She reports she has been having dreams  that are unusual. For example, she has dreamed that her spouse was hitting her and he has never done this and this is not his nature, and Garnet Koyanagi has been in her dreams. Also, she reports she has been experiencing muscle cramps in her calf's and muscle pain in her arms.  Lab Results  Component Value Date   CHOL 207 (H) 01/16/2023   HDL 61 01/16/2023   LDLCALC 123 (H) 01/16/2023   TRIG 128 01/16/2023   CHOLHDL 3.4 01/16/2023    Insomnia: Chronic. Patient is taking Temazepam 30mg  at night, along with Benadryl, Tylenol, and magnesium. She reports this is effective to help her sleep. She reports she has previously been on Ambien and Alprazolam for insomnia.   Outpatient Encounter Medications as of 05/13/2023  Medication Sig   acetaminophen (TYLENOL) 500 MG tablet Take 1,000 mg by mouth at bedtime.   buPROPion (WELLBUTRIN XL) 150 MG 24 hr tablet Take 1 tablet (150 mg total) by mouth daily.   Cholecalciferol (VITAMIN D) 50 MCG (2000 UT) CAPS Take by mouth. 1 capsule daily   Coenzyme Q10 (COQ10) 100 MG CAPS Take 1 capsule by mouth daily.   Cyanocobalamin (VITAMIN B12) 500 MCG TABS Take by mouth. 1 tablet by mouth daily   denosumab (PROLIA) 60 MG/ML SOSY injection Inject 60 mg into the skin once. Once a year   diphenhydrAMINE (BENADRYL) 25 mg capsule Take 25 mg by mouth at bedtime.   folic acid (FOLVITE) 400 MCG tablet Take 400 mcg by mouth daily.   Magnesium 400 MG TABS Take 400 mg by mouth.  omeprazole (PRILOSEC) 40 MG capsule Take 1 capsule by mouth once daily   OVER THE COUNTER MEDICATION 1 tablet daily. Vegan Calcium 1500   simvastatin (ZOCOR) 10 MG tablet Take 1 tablet (10 mg total) by mouth at bedtime.   temazepam (RESTORIL) 30 MG capsule TAKE 1 CAPSULE BY MOUTH ONCE DAILY AT NIGHT AT BEDTIME   No facility-administered encounter medications on file as of 05/13/2023.    Past Medical History:  Diagnosis Date   ADD (attention deficit disorder)    Anxiety    breast ca 2019   Left  breast cancer   Depression    GERD (gastroesophageal reflux disease)    Hiatal hernia    History of radiation therapy 08/13/2018- 09/25/2018   Left Chest wall and IM nodes/ 50 Gy in 25 fractions, Left supraclaicular fossa and PAB/ 50 Gy in 25 fractions, Left chest wall scar boost/ 10 Gy in 5 fractions.    Hyperlipemia, mixed    Intermittent palpitations    Malignant neoplasm of overlapping sites of left breast in female, estrogen receptor positive (HCC) 05/06/2018   oncologist-  Dawn P[facility]ia [facility]--  Invasive Lobular Cancer, CIS, Stage IB, Grade 2 (pT3,pN1a,cM0), ER+---  s/p  left mastectomy with sln dissections and right mastectomy (benign)   PONV (postoperative nausea and vomiting)    Right thyroid nodule    Shingles 12/2017   Tennis elbow     Past Surgical History:  Procedure Laterality Date   AXILLARY LYMPH NODE DISSECTION Left 07/09/2018   Procedure: COMPLETION OF LEFT AXILLARY LYMPH NODE DISSECTION;  Surgeon: Dawn Pretty III, MD;  Location: [facility];  Service: General;  Laterality: Left;   BREAST EXCISIONAL BIOPSY Left 1995   benign   CARDIOVASCULAR STRESS TEST  06/21/2017   normal nuclear stress study w/ no ischemia/  normal LV function and wall function, nuclear stress ef 70%   ELBOW [facility] Left child   MASTECTOMY W/ SENTINEL NODE BIOPSY Left 06/13/2018   MASTECTOMY WITH RADIOACTIVE SEED GUIDED EXCISION AND AXILLARY SENTINEL LYMPH NODE BIOPSY Bilateral 06/13/2018   Procedure: LEFT MASTECTOMY WITH SENTINEL LYMPH NODE MAPPING AND TARGETED NODE DISSECTION AND RIGHT PROPHYLATIC MASTECTOMY;  Surgeon: Dawn Miner, MD;  Location: [facility];  Service: General;  Laterality: Bilateral;   THYROIDECTOMY Left 10-31-2001   [facility] @[facility]    TRANSTHORACIC ECHOCARDIOGRAM  06/21/2017   ef 60-65%/  trivial MR (no evidence mvp)   TUBAL LIGATION  yrs ago   VAGINAL HYSTERECTOMY  1990s    Family History  Problem Relation Age of Onset   Hypertension Mother    Heart attack Mother    Breast cancer Mother     Bipolar disorder Father        Sucide   Other Sister        Passed at birth   Breast cancer Maternal Grandmother    Breast cancer Cousin    Emphysema Half-Sister    Alcohol abuse Half-Brother    Stroke Half-Brother     Social History   Socioeconomic History   Marital status: Married    Spouse name: Not on file   Number of children: 2   Years of education: 12   Highest education level: High school graduate  Occupational History   Not on file  Tobacco Use   Smoking status: Former    Current packs/day: 0.50    Average packs/day: 0.5 packs/day for 50.7 years (25.3 ttl pk-yrs)    Types: Cigarettes    Start date: 1974   Smokeless tobacco:  Never  Vaping Use   Vaping status: Never Used  Substance and Sexual Activity   Alcohol use: Not Currently   Drug use: No   Sexual activity: Yes    Partners: Male    Birth control/protection: None  Other Topics Concern   Not on file  Social History Narrative   Lives at home with husband.   Social Determinants of Health   Financial Resource Strain: Low Risk  (01/24/2023)   Overall Financial Resource Strain (CARDIA)    Difficulty of Paying Living Expenses: Not hard at all  Food Insecurity: No Food Insecurity (01/24/2023)   Hunger Vital Sign    Worried About Running Out of Food in the Last Year: Never true    Ran Out of Food in the Last Year: Never true  Transportation Needs: No Transportation Needs (01/24/2023)   PRAPARE - Administrator, Civil Service (Medical): No    Lack of Transportation (Non-Medical): No  Physical Activity: Inactive (01/24/2023)   Exercise Vital Sign    Days of Exercise per Week: 0 days    Minutes of Exercise per Session: 0 min  Stress: No Stress Concern Present (01/24/2023)   [employer] of Occupational Health - Occupational Stress Questionnaire    Feeling of Stress : Not at all  Social Connections: Socially Integrated (01/24/2023)   Social Connection and Isolation Panel [NHANES]    Frequency of  Communication with Friends and Family: More than three times a week    Frequency of Social Gatherings with Friends and Family: More than three times a week    Attends Religious Services: More than 4 times per year    Active Member of [employer] [facility] Organizations: Yes    Attends Engineer, structural: More than 4 times per year    Marital Status: Married  Catering manager Violence: Not At Risk (01/24/2023)   Humiliation, Afraid, Rape, and Kick questionnaire    Fear of Current Booker Ex-Partner: No    Emotionally Abused: No    Physically Abused: No    Sexually Abused: No    ROS See HPI above    Objective   BP 124/78   Pulse 73   Temp 98.3 F (36.8 C)   Ht 5\' 5"  (1.651 m)   Wt 151 lb 6 oz (68.7 kg)   SpO2 98%   BMI 25.19 kg/m   Physical Exam Vitals reviewed.  Constitutional:      General: She is not in acute distress.    Appearance: Normal appearance. She is not ill-appearing, toxic-appearing Booker diaphoretic.  HENT:     Head: Normocephalic and atraumatic.  Eyes:     General:        Right eye: No discharge.        Left eye: No discharge.     Conjunctiva/sclera: Conjunctivae normal.  Cardiovascular:     Rate and Rhythm: Normal rate and regular rhythm.     Heart sounds: Normal heart sounds. No murmur heard.    No friction rub. No gallop.  Pulmonary:     Effort: Pulmonary effort is normal. No respiratory distress.     Breath sounds: Normal breath sounds.  Musculoskeletal:        General: Normal range of motion.     Right lower leg: No edema.     Left lower leg: No edema.  Skin:    General: Skin is warm and dry.  Neurological:     General: No focal deficit present.  Mental Status: She is alert and oriented to person, place, and time. Mental status is at baseline.  Psychiatric:        Mood and Affect: Mood normal.        Behavior: Behavior normal.        Thought Content: Thought content normal.        Judgment: Judgment normal.     Latest Reference Range & Units  05/13/23 15:46  POC Amphetamine UR None Detected  None Detected  POC Cocaine UR None Detected  None Detected  POC Methamphetamine UR None Detected  None Detected  POC Ecstasy UR None Detected  None Detected  POC Oxycodone UR None Detected  None Detected  POC Methadone UR None Detected  None Detected  POC Marijuana UR None Detected  None Detected  POC Opiate Ur None Detected  None Detected  POC PHENCYCLIDINE UR None Detected  None Detected  POC TRICYCLICS UR None Detected  None Detected  POC Barbiturate UR None Detected  None Detected !  POC BENZODIAZEPINES UR None Detected  Positive !  URINE TEMPERATURE 90.0 - 100.0 Degrees F 94.0  !: Data is abnormal    Assessment & Plan:  Encounter to establish care  Estradiol deficiency -     DG Bone Density; Future  Personal history of endocrine disorder -     VITAMIN D 25 Hydroxy (Vit-D Deficiency, Fractures); Future  Need for hepatitis C screening test -     Hepatitis C antibody; Future  Leg cramps -     Magnesium; Future -     Comprehensive metabolic panel; Future -     Vitamin B12; Future -     Iron, TIBC and Ferritin Panel; Future -     CBC with Differential/Platelet; Future  Gastroesophageal reflux disease without esophagitis Assessment & Plan: Stable. Continue with Omeprazole 40mg  daily. Ordered magnesium since Omeprazole can deplete magnesium and she is experiencing muscle cramps.   Orders: -     Magnesium; Future  MDD (major depressive disorder), recurrent episode, moderate (HCC) Assessment & Plan: Stable. Continue taking Wellbutrin 150mg /24 hr daily. Effective.    Insomnia due to mental condition Assessment & Plan: Stable. Continue Temazepam 30mg  nightly. Urine drug screen completed to take over the management of controlled substance. Benzodiazepine positive, which is correct for medication she is prescribed, no concerns. UDS will need to be completed periodically. Controlled substance agreement signed by patient and  provider. Patient received a copy and will need to updated yearly. Concerned about patient taking Benadryl and Tylenol every night with Temazepam. At next visit, may discuss further with changing her insomnia medication where she is not taking Benadryl and Tylenol.   Orders: -     POCT Urine drug screen  Vitamin D deficiency Assessment & Plan: Ordered vitamin D for previous deficiency. Patient does take Vitamin D daily.   Orders: -     VITAMIN D 25 Hydroxy (Vit-D Deficiency, Fractures); Future  Hypercholesterolemia Assessment & Plan: Ordered lipid panel that patient is going to obtain when fasting. Discussed about possibly changing her Simvastatin to Atorvastatin since she is reporting unusual dreams since starting medication and having muscle cramps. Will look at lab results before changing medication.   Orders: -     Comprehensive metabolic panel; Future -     Lipid panel; Future  GAD (generalized anxiety disorder) Assessment & Plan: Stable. Continue with Wellbutrin 150mg /24 hr daily. Effective.    1.Review health maintenance:  -Covid vaccine/booster: Initial covid vaccine and 1  booster; declines additional boosters;  -Hep C screening-ordered  -Influenza vaccine: Nurse visit in October  -Mammogram: does not have due to [facility]  -Bone density scan: 2022; ordered bone density scan.  2.Ordered labs for reason of muscle cramps: magnesium, CMP, vitamin B12, CBC and iron panel.   Return in about 3 months (around 08/12/2023) for chronic management at [facility];Lab visit when fasting.   Zandra Abts, NP

## 2023-05-13 NOTE — Patient Instructions (Addendum)
-  It was a pleasure to meet you and look forward to taking care of you. -Ordered labs. Recommend to make a lab visit when you are fasting. Office will call with lab results and you will see them on MyChart.  -Urine drug screen completed today to start management of Temazepam. A urine drug screen will be completed periodically. Signed controlled substance agreement and received a copy. This will be updated yearly.  -Follow up in 3 months for chronic management.

## 2023-05-14 ENCOUNTER — Encounter: Payer: Self-pay | Admitting: Family Medicine

## 2023-05-14 NOTE — Assessment & Plan Note (Signed)
Stable. Continue with Omeprazole 40mg  daily. Ordered magnesium since Omeprazole can deplete magnesium and she is experiencing muscle cramps.

## 2023-05-14 NOTE — Assessment & Plan Note (Signed)
Stable. Continue with Wellbutrin 150mg /24 hr daily. Effective.

## 2023-05-14 NOTE — Assessment & Plan Note (Signed)
Ordered lipid panel that patient is going to obtain when fasting. Discussed about possibly changing her Simvastatin to Atorvastatin since she is reporting unusual dreams since starting medication and having muscle cramps. Will look at lab results before changing medication.

## 2023-05-14 NOTE — Assessment & Plan Note (Signed)
Ordered vitamin D for previous deficiency. Patient does take Vitamin D daily.

## 2023-05-14 NOTE — Assessment & Plan Note (Signed)
Stable. Continue taking Wellbutrin 150mg /24 hr daily. Effective.

## 2023-05-14 NOTE — Assessment & Plan Note (Addendum)
Stable. Continue Temazepam 30mg  nightly. Urine drug screen completed to take over the management of controlled substance. Benzodiazepine positive, which is correct for medication she is prescribed, no concerns. UDS will need to be completed periodically. Controlled substance agreement signed by patient and provider. Patient received a copy and will need to updated yearly. Concerned about patient taking Benadryl and Tylenol every night with Temazepam. At next visit, may discuss further with changing her insomnia medication where she is not taking Benadryl and Tylenol.

## 2023-05-16 ENCOUNTER — Encounter: Payer: Self-pay | Admitting: Family Medicine

## 2023-05-17 ENCOUNTER — Telehealth: Payer: Self-pay

## 2023-05-17 ENCOUNTER — Other Ambulatory Visit: Payer: Self-pay

## 2023-05-17 DIAGNOSIS — E2839 Other primary ovarian failure: Secondary | ICD-10-CM

## 2023-05-17 DIAGNOSIS — E289 Ovarian dysfunction, unspecified: Secondary | ICD-10-CM

## 2023-05-17 NOTE — Addendum Note (Signed)
Addended by: Ester Rink on: 05/17/2023 03:28 PM   Modules accepted: Orders

## 2023-05-17 NOTE — Telephone Encounter (Signed)
error 

## 2023-05-17 NOTE — Addendum Note (Signed)
Addended by: Ester Rink on: 05/17/2023 03:19 PM   Modules accepted: Orders

## 2023-05-21 ENCOUNTER — Telehealth: Payer: Self-pay

## 2023-05-21 ENCOUNTER — Other Ambulatory Visit (INDEPENDENT_AMBULATORY_CARE_PROVIDER_SITE_OTHER): Payer: PPO

## 2023-05-21 DIAGNOSIS — E559 Vitamin D deficiency, unspecified: Secondary | ICD-10-CM

## 2023-05-21 DIAGNOSIS — Z1159 Encounter for screening for other viral diseases: Secondary | ICD-10-CM

## 2023-05-21 DIAGNOSIS — E7800 Pure hypercholesterolemia, unspecified: Secondary | ICD-10-CM | POA: Diagnosis not present

## 2023-05-21 DIAGNOSIS — E2839 Other primary ovarian failure: Secondary | ICD-10-CM

## 2023-05-21 DIAGNOSIS — K219 Gastro-esophageal reflux disease without esophagitis: Secondary | ICD-10-CM | POA: Diagnosis not present

## 2023-05-21 DIAGNOSIS — Z8639 Personal history of other endocrine, nutritional and metabolic disease: Secondary | ICD-10-CM | POA: Diagnosis not present

## 2023-05-21 DIAGNOSIS — R252 Cramp and spasm: Secondary | ICD-10-CM | POA: Diagnosis not present

## 2023-05-21 DIAGNOSIS — Z130 Encounter for screening for diseases of the blood and blood-forming organs and certain disorders involving the immune mechanism: Secondary | ICD-10-CM | POA: Diagnosis not present

## 2023-05-21 LAB — COMPREHENSIVE METABOLIC PANEL
ALT: 13 U/L (ref 0–35)
AST: 12 U/L (ref 0–37)
Albumin: 4.3 g/dL (ref 3.5–5.2)
Alkaline Phosphatase: 50 U/L (ref 39–117)
BUN: 16 mg/dL (ref 6–23)
CO2: 28 meq/L (ref 19–32)
Calcium: 9.3 mg/dL (ref 8.4–10.5)
Chloride: 104 meq/L (ref 96–112)
Creatinine, Ser: 0.82 mg/dL (ref 0.40–1.20)
GFR: 74.29 mL/min (ref >60.00)
Glucose, Bld: 96 mg/dL (ref 70–99)
Potassium: 3.9 meq/L (ref 3.5–5.1)
Sodium: 139 meq/L (ref 135–145)
Total Bilirubin: 0.5 mg/dL (ref 0.2–1.2)
Total Protein: 6.7 g/dL (ref 6.0–8.3)

## 2023-05-21 LAB — CBC WITH DIFFERENTIAL/PLATELET
Basophils Absolute: 0 10*3/uL (ref 0.0–0.1)
Basophils Relative: 0.7 % (ref 0.0–3.0)
Eosinophils Absolute: 0.1 10*3/uL (ref 0.0–0.7)
Eosinophils Relative: 1.3 % (ref 0.0–5.0)
HCT: 42 % (ref 36.0–46.0)
Hemoglobin: 13.9 g/dL (ref 12.0–15.0)
Lymphocytes Relative: 32.7 % (ref 12.0–46.0)
Lymphs Abs: 1.5 10*3/uL (ref 0.7–4.0)
MCHC: 33 g/dL (ref 30.0–36.0)
MCV: 94.3 fl (ref 78.0–100.0)
Monocytes Absolute: 0.2 10*3/uL (ref 0.1–1.0)
Monocytes Relative: 5.5 % (ref 3.0–12.0)
Neutro Abs: 2.7 10*3/uL (ref 1.4–7.7)
Neutrophils Relative %: 59.8 % (ref 43.0–77.0)
Platelets: 181 10*3/uL (ref 150.0–400.0)
RBC: 4.45 Mil/uL (ref 3.87–5.11)
RDW: 13.2 % (ref 11.5–15.5)
WBC: 4.5 10*3/uL (ref 4.0–10.5)

## 2023-05-21 LAB — LIPID PANEL
Cholesterol: 188 mg/dL (ref 0–200)
HDL: 61.5 mg/dL (ref >39.00)
LDL Cholesterol: 105 mg/dL — ABNORMAL HIGH (ref 0–99)
NonHDL: 126.15
Total CHOL/HDL Ratio: 3
Triglycerides: 108 mg/dL (ref 0.0–149.0)
VLDL: 21.6 mg/dL (ref 0.0–40.0)

## 2023-05-21 LAB — MAGNESIUM: Magnesium: 1.9 mg/dL (ref 1.5–2.5)

## 2023-05-21 LAB — VITAMIN B12: Vitamin B-12: 902 pg/mL (ref 211–911)

## 2023-05-21 LAB — VITAMIN D 25 HYDROXY (VIT D DEFICIENCY, FRACTURES): VITD: 53.04 ng/mL (ref 30.00–100.00)

## 2023-05-21 NOTE — Telephone Encounter (Signed)
-----   Message from Zandra Abts sent at 05/21/2023  2:01 PM EDT ----- All your labs are stable. If you would like, we can switch your Simvastatin to Atorvastatin 10mg  at night to see if this helps with your dreams that you have noticed since starting Simvastatin.

## 2023-05-22 ENCOUNTER — Telehealth: Payer: Self-pay

## 2023-05-22 ENCOUNTER — Other Ambulatory Visit: Payer: Self-pay

## 2023-05-22 DIAGNOSIS — E7800 Pure hypercholesterolemia, unspecified: Secondary | ICD-10-CM

## 2023-05-22 DIAGNOSIS — Z780 Asymptomatic menopausal state: Secondary | ICD-10-CM

## 2023-05-22 MED ORDER — ATORVASTATIN CALCIUM 10 MG PO TABS
10.0000 mg | ORAL_TABLET | Freq: Every day | ORAL | 0 refills | Status: DC
Start: 2023-05-22 — End: 2023-06-20

## 2023-05-22 NOTE — Telephone Encounter (Signed)
Pt states she likes to take her statin in pm.

## 2023-05-22 NOTE — Telephone Encounter (Signed)
Pt has appt. To talk about insomnia.

## 2023-05-22 NOTE — Telephone Encounter (Signed)
Sent to [facility] Patter

## 2023-05-22 NOTE — Telephone Encounter (Signed)
Left vm to call about labs and medication

## 2023-05-22 NOTE — Telephone Encounter (Signed)
Pt agreed to Atorvastatin 10 mg. Sent in for 30  day. Asked pt to let us know how she is doing in 2 weeks. Told her to call or send MyChart message Pt reports she does not take statins at night and takes in am.  Pt would like to know what to do about Benadryl? She states you and her spoke about this morning.

## 2023-05-22 NOTE — Telephone Encounter (Signed)
Placed new order

## 2023-05-23 ENCOUNTER — Telehealth: Payer: Self-pay

## 2023-05-23 LAB — IRON,TIBC AND FERRITIN PANEL
%SAT: 27 % (calc) (ref 16–45)
Ferritin: 27 ng/mL (ref 16–288)
Iron: 95 ug/dL (ref 45–160)
TIBC: 351 mcg/dL (calc) (ref 250–450)

## 2023-05-23 LAB — HEPATITIS C ANTIBODY: Hepatitis C Ab: NONREACTIVE

## 2023-05-23 NOTE — Telephone Encounter (Signed)
-----   Message from Zandra Abts sent at 05/23/2023  1:58 PM EDT ----- Hep C is non reactive.

## 2023-05-23 NOTE — Telephone Encounter (Signed)
-----   Message from Zandra Abts sent at 05/22/2023  5:16 PM EDT ----- Iron panel is stable.

## 2023-05-28 ENCOUNTER — Encounter: Payer: Self-pay | Admitting: Family Medicine

## 2023-05-28 ENCOUNTER — Telehealth (INDEPENDENT_AMBULATORY_CARE_PROVIDER_SITE_OTHER): Payer: PPO | Admitting: Family Medicine

## 2023-05-28 DIAGNOSIS — F5101 Primary insomnia: Secondary | ICD-10-CM | POA: Diagnosis not present

## 2023-05-28 NOTE — Progress Notes (Signed)
Virtual Visit via Video Note  I connected with Dawn Booker on 05/28/23 at 3:01 PM by a video enabled telemedicine application and verified that I am speaking with the correct person using two identifiers.   Patient Location: Home Provider Location: office - [facility].    I discussed the limitations, risks, security and privacy concerns of performing an evaluation and management service by telephone and the availability of in person appointments. I also discussed with the patient that there may be a patient responsible charge related to this service. The patient expressed understanding and agreed to proceed, consent obtained  Chief Complaint  Patient presents with   Follow-up    Pt is at home via Mychart video appt. Pt reports she is unable check vitals at home. Pt reports she thinks her b12 was high and has been taking only a half of her tablet. Pt thinks this was affecting her sleep. Pt reports she has been getting 5-6 hrs sleep at night.    History of Present Illness: Dawn Booker is a 67 y.o. female to discuss about insomnia.  Patient has been taking Temazepam 30mg , Benadryl 25mg , and Tylenol 1,000 at night time to help with insomnia for the last 3-5 years. Concerned about patient taking Benadryl nightly with her age and side effects of long term use of medication. In the past, she reports she has tried Ambien and Xanax. Since starting this regiment, she has retired and not as stressed.    Patient Active Problem List   Diagnosis Date Noted   Prediabetes 01/10/2022   Anal spasm 08/25/2020   Biliary dyskinesia 08/25/2020   Constipation 08/25/2020   Diaphragmatic hernia 08/25/2020   Glucose intolerance (impaired glucose tolerance) 06/26/2019   Hypercholesterolemia 06/26/2019   Vitamin D deficiency 06/26/2019   Osteopenia after menopause 06/26/2019   H/O left mastectomy 06/09/2019   MDD (major depressive disorder), recurrent episode, moderate (HCC) 05/06/2019   GAD  (generalized anxiety disorder) 05/06/2019   Insomnia due to mental condition 05/06/2019   Cancer of left female breast (HCC) 06/13/2018   H/O bilateral mastectomy 06/13/2018   Malignant neoplasm of overlapping sites of left breast in female, estrogen receptor positive (HCC) 05/09/2018   Blurring of visual image of both eyes 12/06/2017   Postherpetic neuralgia 12/06/2017   Nasal turbinate hypertrophy 10/09/2016   Deviated septum 08/31/2016   Gastroesophageal reflux disease 10/10/2009   Past Medical History:  Diagnosis Date   ADD (attention deficit disorder)    Anxiety    breast ca 2019   Left breast cancer   Depression    GERD (gastroesophageal reflux disease)    Hiatal hernia    History of radiation therapy 08/13/2018- 09/25/2018   Left Chest wall and IM nodes/ 50 Gy in 25 fractions, Left supraclaicular fossa and PAB/ 50 Gy in 25 fractions, Left chest wall scar boost/ 10 Gy in 5 fractions.    Hyperlipemia, mixed    Intermittent palpitations    Malignant neoplasm of overlapping sites of left breast in female, estrogen receptor positive (HCC) 05/06/2018   oncologist-  dr Pamelia Hoit--  Invasive Lobular Cancer, CIS, Stage IB, Grade 2 (pT3,pN1a,cM0), ER+---  s/p  left mastectomy with sln dissections and right mastectomy (benign)   Osteoporosis    PONV (postoperative nausea and vomiting)    Right thyroid nodule    Shingles 12/2017   Tennis elbow    Past Surgical History:  Procedure Laterality Date   AXILLARY LYMPH NODE DISSECTION Left 07/09/2018   Procedure: COMPLETION OF  LEFT AXILLARY LYMPH NODE DISSECTION;  Surgeon: Chevis Pretty III, MD;  Location: [facility];  Service: General;  Laterality: Left;   BREAST EXCISIONAL BIOPSY Left 1995   benign   CARDIOVASCULAR STRESS TEST  06/21/2017   normal nuclear stress study w/ no ischemia/  normal LV function and wall function, nuclear stress ef 70%   ELBOW SURGERY Left child   MASTECTOMY W/ SENTINEL NODE BIOPSY Left 06/13/2018   MASTECTOMY WITH  RADIOACTIVE SEED GUIDED EXCISION AND AXILLARY SENTINEL LYMPH NODE BIOPSY Bilateral 06/13/2018   Procedure: LEFT MASTECTOMY WITH SENTINEL LYMPH NODE MAPPING AND TARGETED NODE DISSECTION AND RIGHT PROPHYLATIC MASTECTOMY;  Surgeon: Griselda Miner, MD;  Location: [facility];  Service: General;  Laterality: Bilateral;   THYROIDECTOMY Left 10-31-2001   dr gerkin @[facility]    TRANSTHORACIC ECHOCARDIOGRAM  06/21/2017   ef 60-65%/  trivial MR (no evidence mvp)   TUBAL LIGATION  yrs ago   VAGINAL HYSTERECTOMY  1990s   Allergies  Allergen Reactions   Lamictal [Lamotrigine] Other (See Comments)    Trudie Buckler Syndrome   Cymbalta [Duloxetine Hcl]     Trudie Buckler syndrome   Hydrocodone Nausea Only   Mobic [Meloxicam]     Same ingredients as lamictal   Sulfa Antibiotics Hives   Corticosteroids Rash   Morphine Palpitations and Other (See Comments)   Other     "MYCINS" - severe abdominal cramping    Prednisone Rash        Prozac [Fluoxetine Hcl] Palpitations   Vortioxetine Anxiety and Other (See Comments)    Increased anxiety   Zoloft [Sertraline] Anxiety   Prior to Admission medications   Medication Sig Start Date End Date Taking? Authorizing Provider  acetaminophen (TYLENOL) 500 MG tablet Take 1,000 mg by mouth at bedtime.   Yes [provider]  atorvastatin (LIPITOR) 10 MG tablet Take 1 tablet (10 mg total) by mouth daily. 05/22/23  Yes Alveria Apley, NP  buPROPion (WELLBUTRIN XL) 150 MG 24 hr tablet Take 1 tablet (150 mg total) by mouth daily. 01/14/23  Yes Saralyn Pilar A, PA  Cholecalciferol (VITAMIN D) 50 MCG (2000 UT) CAPS Take by mouth. 1 capsule daily   Yes [provider]  Coenzyme Q10 (COQ10) 100 MG CAPS Take 1 capsule by mouth daily.   Yes [provider]  Cyanocobalamin (VITAMIN B12) 500 MCG TABS Take by mouth. 1 tablet by mouth daily   Yes [provider]  denosumab (PROLIA) 60 MG/ML SOSY injection Inject 60 mg into the skin once. Once a year    Yes [provider]  diphenhydrAMINE (BENADRYL) 25 mg capsule Take 25 mg by mouth at bedtime.   Yes [provider]  folic acid (FOLVITE) 400 MCG tablet Take 400 mcg by mouth daily.   Yes [provider]  Magnesium 400 MG TABS Take 400 mg by mouth.   Yes [provider]  omeprazole (PRILOSEC) 40 MG capsule Take 1 capsule by mouth once daily 04/01/23  Yes Sandre Kitty, MD  OVER THE COUNTER MEDICATION 1 tablet daily. Vegan Calcium 1500   Yes [provider]  temazepam (RESTORIL) 30 MG capsule TAKE 1 CAPSULE BY MOUTH ONCE DAILY AT NIGHT AT BEDTIME 04/01/23  Yes Sandre Kitty, MD   Social History   Socioeconomic History   Marital status: Married    Spouse name: Not on file   Number of children: 2   Years of education: 12   Highest education level: High school graduate  Occupational  History   Not on file  Tobacco Use   Smoking status: Former    Current packs/day: 0.50    Average packs/day: 0.5 packs/day for 50.7 years (25.4 ttl pk-yrs)    Types: Cigarettes    Start date: 104   Smokeless tobacco: Never  Vaping Use   Vaping status: Never Used  Substance and Sexual Activity   Alcohol use: Not Currently   Drug use: No   Sexual activity: Yes    Partners: Male    Birth control/protection: None  Other Topics Concern   Not on file  Social History Narrative   Lives at home with husband.   Social Determinants of Health   Financial Resource Strain: Low Risk  (01/24/2023)   Overall Financial Resource Strain (CARDIA)    Difficulty of Paying Living Expenses: Not hard at all  Food Insecurity: No Food Insecurity (01/24/2023)   Hunger Vital Sign    Worried About Running Out of Food in the Last Year: Never true    Ran Out of Food in the Last Year: Never true  Transportation Needs: No Transportation Needs (01/24/2023)   PRAPARE - Administrator, Civil Service (Medical): No    Lack of Transportation (Non-Medical): No  Physical  Activity: Inactive (01/24/2023)   Exercise Vital Sign    Days of Exercise per Week: 0 days    Minutes of Exercise per Session: 0 min  Stress: No Stress Concern Present (01/24/2023)   [employer] of Occupational Health - Occupational Stress Questionnaire    Feeling of Stress : Not at all  Social Connections: Socially Integrated (01/24/2023)   Social Connection and Isolation Panel [NHANES]    Frequency of Communication with Friends and Family: More than three times a week    Frequency of Social Gatherings with Friends and Family: More than three times a week    Attends Religious Services: More than 4 times per year    Active Member of [employer] or Organizations: Yes    Attends Banker Meetings: More than 4 times per year    Marital Status: Married  Catering manager Violence: Not At Risk (01/24/2023)   Humiliation, Afraid, Rape, and Kick questionnaire    Fear of Current or Ex-Partner: No    Emotionally Abused: No    Physically Abused: No    Sexually Abused: No    Observations/Objective: There were no vitals filed for this visit. Patient was not able to obtain vital signs due to not having access to equipment to obtain vital signs.  Physical Exam Vitals reviewed.  Constitutional:      General: She is not in acute distress.    Appearance: Normal appearance. She is not ill-appearing, toxic-appearing or diaphoretic.  HENT:     Head: Normocephalic and atraumatic.  Eyes:     General:        Right eye: No discharge.        Left eye: No discharge.     Conjunctiva/sclera: Conjunctivae normal.  Pulmonary:     Effort: Pulmonary effort is normal. No respiratory distress.  Skin:    General: Skin is dry.  Neurological:     General: No focal deficit present.     Mental Status: She is alert and oriented to person, place, and time. Mental status is at baseline.  Psychiatric:        Mood and Affect: Mood normal.        Behavior: Behavior normal.        Thought Content:  Thought  content normal.        Judgment: Judgment normal.     Assessment and Plan: Primary insomnia  Through shared decision making, decided to continue Temazepam 30mg  at night, and transition off of Benadryl and Tylenol. She feels she is not as stressed and thinks this new regiment could work. Advised for the first week, will take Benadryl every other night with Tylenol. Second week, will take Benadryl every 3rd night while taking Tylenol. Once she has discontinued Benadryl, she is going to try to discontinue Tylenol. If she has any complications or feels that she is not sleeping well with Temazepam, she will follow up before her next visit in December. If she needs a follow up appointment, she can have a telehealth visit.    I discussed the assessment and treatment plan with the patient. The patient was provided an opportunity to ask questions and all were answered. The patient agreed with the plan and demonstrated an understanding of the instructions.   The patient was advised to call back or seek an in-person evaluation if the symptoms worsen or if the condition fails to improve as anticipated.  Zandra Abts, NP

## 2023-05-29 DIAGNOSIS — D225 Melanocytic nevi of trunk: Secondary | ICD-10-CM | POA: Diagnosis not present

## 2023-05-29 DIAGNOSIS — L578 Other skin changes due to chronic exposure to nonionizing radiation: Secondary | ICD-10-CM | POA: Diagnosis not present

## 2023-05-29 DIAGNOSIS — L814 Other melanin hyperpigmentation: Secondary | ICD-10-CM | POA: Diagnosis not present

## 2023-05-29 DIAGNOSIS — D1801 Hemangioma of skin and subcutaneous tissue: Secondary | ICD-10-CM | POA: Diagnosis not present

## 2023-05-29 DIAGNOSIS — L821 Other seborrheic keratosis: Secondary | ICD-10-CM | POA: Diagnosis not present

## 2023-05-29 DIAGNOSIS — B078 Other viral warts: Secondary | ICD-10-CM | POA: Diagnosis not present

## 2023-05-30 ENCOUNTER — Telehealth: Payer: Self-pay | Admitting: Family Medicine

## 2023-05-30 NOTE — Telephone Encounter (Signed)
Placed in folder at nurse station

## 2023-05-30 NOTE — Telephone Encounter (Signed)
Received forms from [employer] & placed in provider bin

## 2023-05-31 NOTE — Telephone Encounter (Signed)
Faxed

## 2023-06-03 ENCOUNTER — Encounter: Payer: Self-pay | Admitting: Family Medicine

## 2023-06-18 ENCOUNTER — Other Ambulatory Visit: Payer: PPO

## 2023-06-20 ENCOUNTER — Encounter: Payer: Self-pay | Admitting: Family Medicine

## 2023-06-20 DIAGNOSIS — E7800 Pure hypercholesterolemia, unspecified: Secondary | ICD-10-CM

## 2023-06-20 MED ORDER — ATORVASTATIN CALCIUM 10 MG PO TABS
10.0000 mg | ORAL_TABLET | Freq: Every day | ORAL | 0 refills | Status: DC
Start: 2023-06-20 — End: 2023-10-07

## 2023-06-25 ENCOUNTER — Ambulatory Visit: Payer: PPO | Admitting: Family Medicine

## 2023-07-02 ENCOUNTER — Ambulatory Visit (INDEPENDENT_AMBULATORY_CARE_PROVIDER_SITE_OTHER): Payer: PPO | Admitting: Family Medicine

## 2023-07-02 VITALS — BP 142/88 | HR 90 | Temp 98.2°F | Wt 151.4 lb

## 2023-07-02 DIAGNOSIS — J302 Other seasonal allergic rhinitis: Secondary | ICD-10-CM | POA: Diagnosis not present

## 2023-07-02 DIAGNOSIS — R058 Other specified cough: Secondary | ICD-10-CM

## 2023-07-02 LAB — POC COVID19 BINAXNOW: SARS Coronavirus 2 Ag: NEGATIVE

## 2023-07-02 MED ORDER — CETIRIZINE HCL 10 MG PO TABS: 10.0000 mg | ORAL_TABLET | Freq: Every day | ORAL | 2 refills | Status: DC

## 2023-07-02 NOTE — Progress Notes (Signed)
Acute Office Visit   Subjective:  Patient ID: Dawn Booker, female    DOB: July 09, 1956, 67 y.o.   MRN: [id number]  Chief Complaint  Patient presents with   Cough    Cough started 4 days ago, has had itchy ears for about a week. Cough is not productive. Throat has been scratchy for about a week.     HPI: Patient is here for an acute visit. She is complaining of a non-productive cough, that started 4 days ago. Also, complains of itchy ears and scratchy throat for about 1.5 weeks ago. Post nasal drip.   She reports she has took Sudafed the previous two days with no relief. Also, took Tylenol as needed with no relief.   Denies fever, chest pain, shortness of breath, nasal congestion,or headache.   She reports she has been around her grand daughter who has a ear infections and son had a cold.   Review of Systems  Respiratory:  Positive for cough.    See HPI above      Objective:   BP (!) 142/88 (BP Location: Right Arm, Patient Position: Sitting, Cuff Size: Normal)   Pulse 90   Temp 98.2 F (36.8 C) (Oral)   Wt 151 lb 6.4 oz (68.7 kg)   SpO2 98%   BMI 25.19 kg/m    Physical Exam Vitals reviewed.  Constitutional:      General: She is not in acute distress.    Appearance: Normal appearance. She is not ill-appearing, toxic-appearing or diaphoretic.  HENT:     Head: Normocephalic and atraumatic.     Right Ear: Tympanic membrane, ear canal and external ear normal. There is no impacted cerumen.     Left Ear: Tympanic membrane, ear canal and external ear normal. There is no impacted cerumen.     Nose:     Right Sinus: No maxillary sinus tenderness or frontal sinus tenderness.     Left Sinus: No maxillary sinus tenderness or frontal sinus tenderness.     Mouth/Throat:     Pharynx: Oropharynx is clear. Uvula midline. No pharyngeal swelling, oropharyngeal exudate, uvula swelling or postnasal drip.  Eyes:     General:        Right eye: No discharge.        Left eye: No  discharge.     Conjunctiva/sclera: Conjunctivae normal.  Cardiovascular:     Rate and Rhythm: Normal rate and regular rhythm.     Heart sounds: Normal heart sounds. No murmur heard.    No friction rub. No gallop.  Pulmonary:     Effort: Pulmonary effort is normal. No respiratory distress.     Breath sounds: Normal breath sounds.     Comments: Non productive cough during visit.  Musculoskeletal:        General: Normal range of motion.  Lymphadenopathy:     Head:     Right side of head: No submental or submandibular adenopathy.     Left side of head: No submental or submandibular adenopathy.  Skin:    General: Skin is warm and dry.  Neurological:     General: No focal deficit present.     Mental Status: She is alert and oriented to person, place, and time. Mental status is at baseline.     Gait: Gait is intact.  Psychiatric:        Attention and Perception: Attention normal.        Mood and Affect: Mood normal.  Speech: Speech normal.        Behavior: Behavior normal. Behavior is cooperative.        Thought Content: Thought content normal.        Judgment: Judgment normal.       Assessment & Plan:  Seasonal allergies -     Cetirizine HCl; Take 1 tablet (10 mg total) by mouth daily.  Dispense: 30 tablet; Refill: 2  Non-productive cough -     POC COVID-19 BinaxNow  -Negative covid test. -Suspect symptoms are related to seasonal allergies.  -Recommend to try not to take Benadryl and take Zyrtec 10mg  tablet, 1 tablet once a day.  -Also, can try Flonase for nasal drainage.  -Follow up if not improved.   Zandra Abts, NP

## 2023-07-02 NOTE — Patient Instructions (Signed)
-  Negative covid test. -Suspect symptoms are related to seasonal allergies.  -Recommend to try not to take Benadryl and take Zyrtec 10mg  tablet, 1 tablet once a day.  -Also, can try Flonase for nasal drainage.  -Follow up if not improved.

## 2023-07-05 ENCOUNTER — Encounter: Payer: Self-pay | Admitting: Family Medicine

## 2023-07-08 ENCOUNTER — Telehealth: Payer: Self-pay | Admitting: Family Medicine

## 2023-07-08 DIAGNOSIS — K219 Gastro-esophageal reflux disease without esophagitis: Secondary | ICD-10-CM

## 2023-07-08 MED ORDER — BENZONATATE 200 MG PO CAPS: 200.0000 mg | ORAL_CAPSULE | Freq: Three times a day (TID) | ORAL | 0 refills | Status: AC

## 2023-07-08 MED ORDER — OMEPRAZOLE 40 MG PO CPDR: 40.0000 mg | DELAYED_RELEASE_CAPSULE | Freq: Every day | ORAL | 2 refills | Status: DC

## 2023-07-08 NOTE — Telephone Encounter (Signed)
Rxs sent

## 2023-07-08 NOTE — Telephone Encounter (Signed)
Prescription Request  07/08/2023  LOV: 07/02/2023  What is the name of the medication or equipment? omeprazole (PRILOSEC) 40 MG capsule    pt also asking for a prescription for the "cough pearls"  Have you contacted your pharmacy to request a refill? No   Which pharmacy would you like this sent to?  [facility], [place] - [address] [facility] [place] [address] Phone: [phone] Fax: [phone]    Patient notified that their request is being sent to the clinical staff for review and that they should receive a response within 2 business days.   Please advise at Mobile [phone] (mobile)

## 2023-08-03 ENCOUNTER — Encounter: Payer: Self-pay | Admitting: Family Medicine

## 2023-08-05 ENCOUNTER — Other Ambulatory Visit: Payer: Self-pay | Admitting: Family

## 2023-08-05 DIAGNOSIS — F5105 Insomnia due to other mental disorder: Secondary | ICD-10-CM

## 2023-08-05 DIAGNOSIS — F411 Generalized anxiety disorder: Secondary | ICD-10-CM

## 2023-08-05 DIAGNOSIS — F331 Major depressive disorder, recurrent, moderate: Secondary | ICD-10-CM

## 2023-08-05 MED ORDER — TEMAZEPAM 30 MG PO CAPS: ORAL_CAPSULE | ORAL | 0 refills | Status: DC

## 2023-08-05 MED ORDER — BUPROPION HCL ER (XL) 150 MG PO TB24: 150.0000 mg | ORAL_TABLET | Freq: Every day | ORAL | 1 refills | Status: DC

## 2023-08-13 ENCOUNTER — Ambulatory Visit: Payer: PPO | Admitting: Family Medicine

## 2023-08-13 ENCOUNTER — Ambulatory Visit: Payer: PPO

## 2023-08-13 ENCOUNTER — Ambulatory Visit: Payer: PPO | Admitting: Internal Medicine

## 2023-08-13 ENCOUNTER — Encounter: Payer: Self-pay | Admitting: Internal Medicine

## 2023-08-13 VITALS — BP 130/84 | Temp 97.5°F | Wt 156.0 lb

## 2023-08-13 DIAGNOSIS — E538 Deficiency of other specified B group vitamins: Secondary | ICD-10-CM | POA: Diagnosis not present

## 2023-08-13 DIAGNOSIS — Z23 Encounter for immunization: Secondary | ICD-10-CM

## 2023-08-13 DIAGNOSIS — E7800 Pure hypercholesterolemia, unspecified: Secondary | ICD-10-CM | POA: Diagnosis not present

## 2023-08-13 LAB — LIPID PANEL
Cholesterol: 177 mg/dL (ref 0–200)
HDL: 61.8 mg/dL (ref >39.00)
LDL Cholesterol: 89 mg/dL (ref 0–99)
NonHDL: 114.77
Total CHOL/HDL Ratio: 3
Triglycerides: 129 mg/dL (ref 0.0–149.0)
VLDL: 25.8 mg/dL (ref 0.0–40.0)

## 2023-08-13 LAB — VITAMIN B12: Vitamin B-12: 510 pg/mL (ref 211–911)

## 2023-08-13 NOTE — Addendum Note (Signed)
Addended by: Kern Reap B on: 08/13/2023 11:33 AM   Modules accepted: Orders

## 2023-08-13 NOTE — Progress Notes (Signed)
Established Patient Office Visit     CC/Reason for Visit: Follow-up cholesterol and B12 levels  HPI: Dawn Booker is a 67 y.o. female who is coming in today for the above mentioned reasons.  Patient is being seen for her PCP who is currently out of the office.  At last visit her simvastatin was switched over to atorvastatin 10 mg as she was experiencing bad dreams on simvastatin.  She is tolerating atorvastatin well, has not had issues with her sleep.  She would also like to check B12 levels.   Past Medical/Surgical History: Past Medical History:  Diagnosis Date   ADD (attention deficit disorder)    Anxiety    breast ca 2019   Left breast cancer   Depression    GERD (gastroesophageal reflux disease)    Hiatal hernia    History of radiation therapy 08/13/2018- 09/25/2018   Left Chest wall and IM nodes/ 50 Gy in 25 fractions, Left supraclaicular fossa and PAB/ 50 Gy in 25 fractions, Left chest wall scar boost/ 10 Gy in 5 fractions.    Hyperlipemia, mixed    Intermittent palpitations    Malignant neoplasm of overlapping sites of left breast in female, estrogen receptor positive (HCC) 05/06/2018   oncologist-  dr Pamelia Hoit--  Invasive Lobular Cancer, CIS, Stage IB, Grade 2 (pT3,pN1a,cM0), ER+---  s/p  left mastectomy with sln dissections and right mastectomy (benign)   Osteoporosis    PONV (postoperative nausea and vomiting)    Right thyroid nodule    Shingles 12/2017   Tennis elbow     Past Surgical History:  Procedure Laterality Date   AXILLARY LYMPH NODE DISSECTION Left 07/09/2018   Procedure: COMPLETION OF LEFT AXILLARY LYMPH NODE DISSECTION;  Surgeon: Chevis Pretty III, MD;  Location: [facility];  Service: General;  Laterality: Left;   BREAST EXCISIONAL BIOPSY Left 1995   benign   CARDIOVASCULAR STRESS TEST  06/21/2017   normal nuclear stress study w/ no ischemia/  normal LV function and wall function, nuclear stress ef 70%   ELBOW SURGERY Left child   MASTECTOMY W/ SENTINEL  NODE BIOPSY Left 06/13/2018   MASTECTOMY WITH RADIOACTIVE SEED GUIDED EXCISION AND AXILLARY SENTINEL LYMPH NODE BIOPSY Bilateral 06/13/2018   Procedure: LEFT MASTECTOMY WITH SENTINEL LYMPH NODE MAPPING AND TARGETED NODE DISSECTION AND RIGHT PROPHYLATIC MASTECTOMY;  Surgeon: Griselda Miner, MD;  Location: [facility];  Service: General;  Laterality: Bilateral;   THYROIDECTOMY Left 10-31-2001   dr gerkin @[facility]    TRANSTHORACIC ECHOCARDIOGRAM  06/21/2017   ef 60-65%/  trivial MR (no evidence mvp)   TUBAL LIGATION  yrs ago   VAGINAL HYSTERECTOMY  1990s    Social History:  reports that she has quit smoking. Her smoking use included cigarettes. She started smoking about 50 years ago. She has a 25.5 pack-year smoking history. She has never used smokeless tobacco. She reports that she does not currently use alcohol. She reports that she does not use drugs.  Allergies: Allergies  Allergen Reactions   Lamictal [Lamotrigine] Other (See Comments)    Trudie Buckler Syndrome   Cymbalta [Duloxetine Hcl]     Trudie Buckler syndrome   Hydrocodone Nausea Only   Mobic [Meloxicam]     Same ingredients as lamictal   Sulfa Antibiotics Hives   Corticosteroids Rash   Morphine Palpitations and Other (See Comments)   Other     "MYCINS" - severe abdominal cramping    Prednisone Rash        Prozac [  Fluoxetine Hcl] Palpitations   Vortioxetine Anxiety and Other (See Comments)    Increased anxiety   Zoloft [Sertraline] Anxiety    Family History:  Family History  Problem Relation Age of Onset   Hypertension Mother    Heart attack Mother    Breast cancer Mother    Bipolar disorder Father        Sucide   Other Sister        Passed at birth   Breast cancer Maternal Grandmother    Breast cancer Cousin    Emphysema Half-Sister    Alcohol abuse Half-Brother    Stroke Half-Brother      Current Outpatient Medications:    acetaminophen (TYLENOL) 500 MG tablet, Take 1,000 mg by mouth at bedtime., Disp: , Rfl:     atorvastatin (LIPITOR) 10 MG tablet, Take 1 tablet (10 mg total) by mouth daily., Disp: 100 tablet, Rfl: 0   buPROPion (WELLBUTRIN XL) 150 MG 24 hr tablet, Take 1 tablet (150 mg total) by mouth daily., Disp: 100 tablet, Rfl: 1   cetirizine (ZYRTEC ALLERGY) 10 MG tablet, Take 1 tablet (10 mg total) by mouth daily., Disp: 30 tablet, Rfl: 2   Cholecalciferol (VITAMIN D) 50 MCG (2000 UT) CAPS, Take by mouth. 1 capsule daily, Disp: , Rfl:    Coenzyme Q10 (COQ10) 100 MG CAPS, Take 1 capsule by mouth daily., Disp: , Rfl:    Cyanocobalamin (VITAMIN B12) 500 MCG TABS, Take by mouth. 1 tablet by mouth daily, Disp: , Rfl:    denosumab (PROLIA) 60 MG/ML SOSY injection, Inject 60 mg into the skin once. Once a year, Disp: , Rfl:    folic acid (FOLVITE) 400 MCG tablet, Take 400 mcg by mouth daily., Disp: , Rfl:    omeprazole (PRILOSEC) 40 MG capsule, Take 1 capsule (40 mg total) by mouth daily., Disp: 100 capsule, Rfl: 2   OVER THE COUNTER MEDICATION, 1 tablet daily. Vegan Calcium 1500, Disp: , Rfl:    temazepam (RESTORIL) 30 MG capsule, TAKE 1 CAPSULE BY MOUTH ONCE DAILY AT NIGHT AT BEDTIME, Disp: 100 capsule, Rfl: 0  Review of Systems:  Negative unless indicated in HPI.   Physical Exam: Vitals:   08/13/23 1041  BP: 130/84  Temp: (!) 97.5 F (36.4 C)  TempSrc: Oral  Weight: 156 lb (70.8 kg)    Body mass index is 25.96 kg/m.   Physical Exam Vitals reviewed.  Constitutional:      Appearance: Normal appearance.  HENT:     Head: Normocephalic and atraumatic.  Eyes:     Conjunctiva/sclera: Conjunctivae normal.  Cardiovascular:     Rate and Rhythm: Normal rate and regular rhythm.  Pulmonary:     Effort: Pulmonary effort is normal.     Breath sounds: Normal breath sounds.  Skin:    General: Skin is warm and dry.  Neurological:     General: No focal deficit present.     Mental Status: She is alert and oriented to person, place, and time.  Psychiatric:        Mood and Affect: Mood  normal.        Behavior: Behavior normal.        Thought Content: Thought content normal.        Judgment: Judgment normal.      Impression and Plan:  Hypercholesterolemia -     Lipid panel; Future  B12 deficiency -     Vitamin B12; Future   -Check cholesterol and vitamin B-12 levels today.  May need to make changes to doses pending results.  Time spent:21 minutes reviewing chart, interviewing and examining patient and formulating plan of care.     Chaya Jan, MD [facility]

## 2023-08-21 ENCOUNTER — Other Ambulatory Visit: Payer: Self-pay | Admitting: Internal Medicine

## 2023-08-21 NOTE — Telephone Encounter (Signed)
Copied from CRM [id number]. Topic: Clinical - Medication Refill >> Aug 21, 2023 10:37 AM Efraim Kaufmann C wrote: Most Recent Primary Care Visit:  Provider: Henderson Cloud  Department: [facility]  Visit Type: OFFICE VISIT  Date: 08/13/2023  Medication: denosumab (PROLIA) 60 MG/ML SOSY injection  Has the patient contacted their pharmacy? No (Agent: If no, request that the patient contact the pharmacy for the refill. If patient does not wish to contact the pharmacy document the reason why and proceed with request.) (Agent: If yes, when and what did the pharmacy advise?)  Is this the correct pharmacy for this prescription? Yes If no, delete pharmacy and type the correct one.  This is the patient's preferred pharmacy:  [facility] [address] [place] - [address] [facility] [place] [address] Phone: [phone] Fax: [phone]   Has the prescription been filled recently? No  Is the patient out of the medication? Yes  Has the patient been seen for an appointment in the last year OR does the patient have an upcoming appointment? Yes  Can we respond through MyChart? Yes  Agent: Please be advised that Rx refills may take up to 3 business days. We ask that you follow-up with your pharmacy.

## 2023-08-22 ENCOUNTER — Telehealth: Payer: Self-pay | Admitting: *Deleted

## 2023-08-22 MED ORDER — DENOSUMAB 60 MG/ML ~~LOC~~ SOSY: 60.0000 mg | PREFILLED_SYRINGE | Freq: Once | SUBCUTANEOUS | 0 refills | Status: DC

## 2023-08-22 MED ORDER — DENOSUMAB 60 MG/ML ~~LOC~~ SOSY: 60.0000 mg | PREFILLED_SYRINGE | SUBCUTANEOUS | 4 refills | Status: DC

## 2023-08-22 NOTE — Telephone Encounter (Signed)
Copied from CRM [id number]. Topic: Clinical - Prescription Issue >> Aug 22, 2023  3:07 PM Tiffany H wrote: Reason for CRM: Robin with [facility] in [place] called to ask about recently prescribed denosumab (PROLIA) 60 MG/ML SOSY injection. Zella Ball would like to know why the frequency has been chosen. Prolia is usually administered every six months. Please assist.

## 2023-08-22 NOTE — Telephone Encounter (Signed)
Rx with new directions was sent.

## 2023-09-05 ENCOUNTER — Ambulatory Visit: Payer: PPO

## 2023-09-19 ENCOUNTER — Inpatient Hospital Stay: Payer: PPO | Attending: Hematology and Oncology | Admitting: Hematology and Oncology

## 2023-09-19 VITALS — BP 121/77 | HR 81 | Temp 98.2°F | Resp 18 | Ht 65.0 in | Wt 158.6 lb

## 2023-09-19 DIAGNOSIS — Z1732 Human epidermal growth factor receptor 2 negative status: Secondary | ICD-10-CM | POA: Diagnosis not present

## 2023-09-19 DIAGNOSIS — C50412 Malignant neoplasm of upper-outer quadrant of left female breast: Secondary | ICD-10-CM | POA: Diagnosis not present

## 2023-09-19 DIAGNOSIS — C50812 Malignant neoplasm of overlapping sites of left female breast: Secondary | ICD-10-CM | POA: Insufficient documentation

## 2023-09-19 DIAGNOSIS — Z170 Estrogen receptor positive status [ER+]: Secondary | ICD-10-CM | POA: Insufficient documentation

## 2023-09-19 DIAGNOSIS — Z923 Personal history of irradiation: Secondary | ICD-10-CM | POA: Diagnosis not present

## 2023-09-19 DIAGNOSIS — Z9013 Acquired absence of bilateral breasts and nipples: Secondary | ICD-10-CM | POA: Insufficient documentation

## 2023-09-19 DIAGNOSIS — Z79811 Long term (current) use of aromatase inhibitors: Secondary | ICD-10-CM | POA: Insufficient documentation

## 2023-09-19 DIAGNOSIS — Z1721 Progesterone receptor positive status: Secondary | ICD-10-CM | POA: Insufficient documentation

## 2023-09-19 NOTE — Progress Notes (Signed)
Patient Care Team: Alveria Apley, NP as PCP - General (Family Medicine) Serena Croissant, MD as Consulting Physician (Hematology and Oncology) Griselda Miner, MD as Consulting Physician (General Surgery) Causey, Larna Daughters, NP as Nurse Practitioner (Hematology and Oncology) Charna Elizabeth, MD as Consulting Physician (Gastroenterology) Maxie Better, MD as Consulting Physician (Obstetrics and Gynecology)  DIAGNOSIS:  Encounter Diagnosis  Name Primary?   Malignant neoplasm of overlapping sites of left breast in female, estrogen receptor positive (HCC) Yes    SUMMARY OF ONCOLOGIC HISTORY: Oncology History  Malignant neoplasm of overlapping sites of left breast in female, estrogen receptor positive (HCC)  07/2015 Miscellaneous   Genetics negative.  Genes tested: APC, ATM, BARD1, BMPR1A, BRCA1, BRCA2, BRIP1, CDH1, CDK4, CDKN2A, CHEK2, EPCAM, GREM1, MLH1, MSH2, MSH6, MUTYH, PALB2, PMS2, POLD1, POLE, PTEN, RAD51C, RAD51D, RET, SMAD4, STK11, and TP53.      05/06/2018 Initial Diagnosis   Screening detected left breast mass anteriorly 1030 to 11 o'clock position 2 masses 6 mm and 7 mm, 12:30 position 2.6 cm both of these masses biopsy-proven grade 2 invasive lobular cancer ER 100%, PR 10%, Ki-67 15%, HER-2 negative 1+ by IHC, lymph node positive for malignancy T2N1 stage IIa clinical stage   05/21/2018 Cancer Staging   Staging form: Breast, AJCC 8th Edition - Pathologic: Stage IB (pT3, pN1a, cM0, G2, ER+, PR+, HER2-) - Signed by Serena Croissant, MD on 06/24/2018   06/13/2018 Surgery   Bilateral mastectomies: Left: ILC, grade 2, 5.4 cm, LCIS, margins negative, lymphovascular invasion present, 2/7 lymph nodes positive with extranodal extension (1 additional lymph node had isolated tumor cells), right mastectomy benign; T3N1a ER 100%, PR 10%, HER-2 -1+, Ki-67 10 to 15%, stage Ib   08/06/2018 - 09/25/2018 Radiation Therapy   Adjuvant XRT   10/09/2018 -  Anti-estrogen oral therapy    Anastrozole 1mg  daily, plan for 7 years; switched to tamoxifen 11/07/18 for bone health; opted to forego all antiestrogen therapy     CHIEF COMPLIANT: Surveillance of breast cancer  HISTORY OF PRESENT ILLNESS:  History of Present Illness   The patient, with a history of breast cancer, presents for a routine follow-up. She reports good health with no new issues. She has been compliant with regular primary care screenings and has had no abnormal findings on recent checks and ultrasounds. She acknowledges the presence of scar tissue which may cause the breast to feel lumpy or bumpy.  The patient is currently on Prolia, which she receives at her primary care provider's office. She has the medication shipped to her home and brings it to her appointments for administration. She has not attempted to self-administer the medication.  The patient has previously tried hormone therapy with tamoxifen but has not retried it.         ALLERGIES:  is allergic to lamictal [lamotrigine], cymbalta [duloxetine hcl], hydrocodone, mobic [meloxicam], sulfa antibiotics, corticosteroids, morphine, other, prednisone, prozac [fluoxetine hcl], vortioxetine, and zoloft [sertraline].  MEDICATIONS:  Current Outpatient Medications  Medication Sig Dispense Refill   acetaminophen (TYLENOL) 500 MG tablet Take 1,000 mg by mouth at bedtime.     atorvastatin (LIPITOR) 10 MG tablet Take 1 tablet (10 mg total) by mouth daily. 100 tablet 0   buPROPion (WELLBUTRIN XL) 150 MG 24 hr tablet Take 1 tablet (150 mg total) by mouth daily. 100 tablet 1   cetirizine (ZYRTEC ALLERGY) 10 MG tablet Take 1 tablet (10 mg total) by mouth daily. 30 tablet 2   Cholecalciferol (VITAMIN D) 50 MCG (2000 UT)  CAPS Take by mouth. 1 capsule daily     Coenzyme Q10 (COQ10) 100 MG CAPS Take 1 capsule by mouth daily.     Cyanocobalamin (VITAMIN B12) 500 MCG TABS Take by mouth. 1 tablet by mouth daily     denosumab (PROLIA) 60 MG/ML SOSY injection Inject 60  mg into the skin every 6 (six) months. 1 mL 4   folic acid (FOLVITE) 400 MCG tablet Take 400 mcg by mouth daily.     omeprazole (PRILOSEC) 40 MG capsule Take 1 capsule (40 mg total) by mouth daily. 100 capsule 2   OVER THE COUNTER MEDICATION 1 tablet daily. Vegan Calcium 1500     temazepam (RESTORIL) 30 MG capsule TAKE 1 CAPSULE BY MOUTH ONCE DAILY AT NIGHT AT BEDTIME 100 capsule 0   No current facility-administered medications for this visit.    PHYSICAL EXAMINATION: ECOG PERFORMANCE STATUS: 1 - Symptomatic but completely ambulatory  Vitals:   09/19/23 1416  BP: 121/77  Pulse: 81  Resp: 18  Temp: 98.2 F (36.8 C)  SpO2: 99%   Filed Weights   09/19/23 1416  Weight: 158 lb 9.6 oz (71.9 kg)    Physical Exam no palpable lumps noticed bilateral chest wall or axilla        (exam performed in the presence of a chaperone)  LABORATORY DATA:  I have reviewed the data as listed    Latest Ref Rng & Units 05/21/2023   10:50 AM 01/16/2023    9:59 AM 11/08/2022    1:49 PM  CMP  Glucose 70 - 99 mg/dL 96  93  94   BUN 6 - 23 mg/dL 16  11  11    Creatinine 0.40 - 1.20 mg/dL 4.78  2.95  6.21   Sodium 135 - 145 mEq/L 139  142  140   Potassium 3.5 - 5.1 mEq/L 3.9  4.3  4.0   Chloride 96 - 112 mEq/L 104  104  105   CO2 19 - 32 mEq/L 28  24  28    Calcium 8.4 - 10.5 mg/dL 9.3  9.2  9.1   Total Protein 6.0 - 8.3 g/dL 6.7  6.5  7.4   Total Bilirubin 0.2 - 1.2 mg/dL 0.5  0.4  0.5   Alkaline Phos 39 - 117 U/L 50  50  45   AST 0 - 37 U/L 12  14  16    ALT 0 - 35 U/L 13  15  19      Lab Results  Component Value Date   WBC 4.5 05/21/2023   HGB 13.9 05/21/2023   HCT 42.0 05/21/2023   MCV 94.3 05/21/2023   PLT 181.0 05/21/2023   NEUTROABS 2.7 05/21/2023    ASSESSMENT & PLAN:  Malignant neoplasm of overlapping sites of left breast in female, estrogen receptor positive (HCC) 06/13/2018:Bilateral mastectomies: Left: ILC, grade 2, 5.4 cm, LCIS, margins negative, lymphovascular invasion  present, 2/7 lymph nodes positive with extranodal extension (1 additional lymph node had isolated tumor cells), right mastectomy benign; T3N1a ER 100%, PR 10%, HER-2 -1+, Ki-67 10 to 15%, stage Ib MammaPrint: Low risk, luminal type a Adjuvant radiation therapy: 08/14/2018- MRI back: L4 and L5 small lesions could not rule out metastatic disease.  PET CT negative for bone metastases.   Treatment: Tamoxifen 20 mg daily Started 11/07/2018-stopped 01/01/2019 due to profound depression/anxiety Couldn't tolerate Anastrozole   10/28/2018 PET CT scan: No evidence of metastatic disease, no hypermetabolism at L4-L5 vertebral bodies to correspond to the MRI findings.  CT abdomen 06/30/2019: No findings to suggest the cause of abdominal discomfort.  No metastatic disease Osteoporosis: T score -3.1   Breast cancer surveillance: 09/19/2023: Palpable chest wall nodule:  11/10/2022: Ultrasound: No suspicious masses 11/16/2022: CT chest: No findings concerning for metastatic disease.  No chest wall tumor   Recommended guardant reveal for MRD monitoring Return to clinic in 1 year      No orders of the defined types were placed in this encounter.  The patient has a good understanding of the overall plan. she agrees with it. she will call with any problems that may develop before the next visit here. Total time spent: 30 mins including face to face time and time spent for planning, charting and co-ordination of care   Tamsen Meek, MD 09/19/23

## 2023-09-19 NOTE — Assessment & Plan Note (Signed)
06/13/2018:Bilateral mastectomies: Left: ILC, grade 2, 5.4 cm, LCIS, margins negative, lymphovascular invasion present, 2/7 lymph nodes positive with extranodal extension (1 additional lymph node had isolated tumor cells), right mastectomy benign; T3N1a ER 100%, PR 10%, HER-2 -1+, Ki-67 10 to 15%, stage Ib MammaPrint: Low risk, luminal type a Adjuvant radiation therapy: 08/14/2018- MRI back: L4 and L5 small lesions could not rule out metastatic disease.  PET CT negative for bone metastases.   Treatment: Tamoxifen 20 mg daily Started 11/07/2018-stopped 01/01/2019 due to profound depression/anxiety Couldn't tolerate Anastrozole   10/28/2018 PET CT scan: No evidence of metastatic disease, no hypermetabolism at L4-L5 vertebral bodies to correspond to the MRI findings.   CT abdomen 06/30/2019: No findings to suggest the cause of abdominal discomfort.  No metastatic disease Osteoporosis: T score -3.1   Breast cancer surveillance: 09/19/2023: Palpable chest wall nodule:  11/10/2022: Ultrasound: No suspicious masses 11/16/2022: CT chest: No findings concerning for metastatic disease.  No chest wall tumor   Return to clinic in 1 year

## 2023-09-23 ENCOUNTER — Other Ambulatory Visit: Payer: Self-pay | Admitting: Family Medicine

## 2023-09-23 DIAGNOSIS — J302 Other seasonal allergic rhinitis: Secondary | ICD-10-CM

## 2023-09-23 MED ORDER — CETIRIZINE HCL 10 MG PO TABS: 10.0000 mg | ORAL_TABLET | Freq: Every day | ORAL | 0 refills | Status: DC

## 2023-09-23 NOTE — Telephone Encounter (Signed)
Copied from CRM [id number]. Topic: Clinical - Medication Refill >> Sep 23, 2023  8:47 AM Marica Otter wrote: Most Recent Primary Care Visit:  Provider: Henderson Cloud  Department: [facility]  Visit Type: OFFICE VISIT  Date: 08/13/2023  Medication: cetirizine (ZYRTEC ALLERGY) 10 MG tablet  Has the patient contacted their pharmacy?  (Agent: If no, request that the patient contact the pharmacy for the refill. If patient does not wish to contact the pharmacy document the reason why and proceed with request.) (Agent: If yes, when and what did the pharmacy advise?)  Is this the correct pharmacy for this prescription?  If no, delete pharmacy and type the correct one.  This is the patient's preferred pharmacy:  [facility] [address], [place] - [address] [facility] [place] [address] Phone: [phone] Fax: [phone]   Has the prescription been filled recently?   Is the patient out of the medication?   Has the patient been seen for an appointment in the last year OR does the patient have an upcoming appointment?   Can we respond through MyChart?   Agent: Please be advised that Rx refills may take up to 3 business days. We ask that you follow-up with your pharmacy.

## 2023-09-23 NOTE — Telephone Encounter (Signed)
Copied from CRM [id number]. Topic: Clinical - Medication Refill >> Sep 23, 2023  8:47 AM Marica Otter wrote: Most Recent Primary Care Visit:  Provider: Henderson Cloud  Department: [facility]  Visit Type: OFFICE VISIT  Date: 08/13/2023  Medication: cetirizine (ZYRTEC ALLERGY) 10 MG tablet  Has the patient contacted their pharmacy? Yes, because quantity needs to be changed needs new prescription (Agent: If no, request that the patient contact the pharmacy for the refill. If patient does not wish to contact the pharmacy document the reason why and proceed with request.) (Agent: If yes, when and what did the pharmacy advise?)  Is this the correct pharmacy for this prescription? Yes If no, delete pharmacy and type the correct one.  This is the patient's preferred pharmacy:  [facility] [address], [place] - [address] [facility] [place] [address] Phone: [phone] Fax: [phone]   Has the prescription been filled recently? No  Is the patient out of the medication? No  Has the patient been seen for an appointment in the last year OR does the patient have an upcoming appointment? Yes  Can we respond through MyChart? Yes  Agent: Please be advised that Rx refills may take up to 3 business days. We ask that you follow-up with your pharmacy.

## 2023-09-23 NOTE — Telephone Encounter (Signed)
See previous note- patient's insurance only covers quantity of 100 pills.   Copied from CRM [id number]. Topic: Clinical - Medication Refill >> Sep 23, 2023  8:47 AM Marica Otter wrote: Most Recent Primary Care Visit:  Provider: Henderson Cloud  Department: [facility]  Visit Type: OFFICE VISIT  Date: 08/13/2023  Medication: cetirizine (ZYRTEC ALLERGY) 10 MG tablet  Has the patient contacted their pharmacy? Yes, because quantity needs to be changed needs new prescription (Agent: If no, request that the patient contact the pharmacy for the refill. If patient does not wish to contact the pharmacy document the reason why and proceed with request.) (Agent: If yes, when and what did the pharmacy advise?)  Is this the correct pharmacy for this prescription? Yes If no, delete pharmacy and type the correct one.  This is the patient's preferred pharmacy:  [facility] [address] [place] - [address] [facility] [place] [address] Phone: [phone] Fax: [phone]   Has the prescription been filled recently? No  Is the patient out of the medication? No  Has the patient been seen for an appointment in the last year OR does the patient have an upcoming appointment? Yes  Can we respond through MyChart? Yes  Agent: Please be advised that Rx refills may take up to 3 business days. We ask that you follow-up with your pharmacy.

## 2023-09-25 ENCOUNTER — Encounter: Payer: Self-pay | Admitting: Hematology and Oncology

## 2023-09-25 ENCOUNTER — Other Ambulatory Visit: Payer: Self-pay | Admitting: *Deleted

## 2023-09-25 ENCOUNTER — Ambulatory Visit: Payer: PPO

## 2023-09-25 DIAGNOSIS — Z9013 Acquired absence of bilateral breasts and nipples: Secondary | ICD-10-CM

## 2023-09-25 DIAGNOSIS — C50812 Malignant neoplasm of overlapping sites of left female breast: Secondary | ICD-10-CM

## 2023-09-25 DIAGNOSIS — M810 Age-related osteoporosis without current pathological fracture: Secondary | ICD-10-CM

## 2023-09-25 DIAGNOSIS — Z9012 Acquired absence of left breast and nipple: Secondary | ICD-10-CM

## 2023-09-25 MED ORDER — DENOSUMAB 60 MG/ML ~~LOC~~ SOSY
60.0000 mg | PREFILLED_SYRINGE | Freq: Once | SUBCUTANEOUS | Status: AC
  Administered 2023-09-25: 60 mg via SUBCUTANEOUS

## 2023-09-25 NOTE — Progress Notes (Signed)
Per orders of Dr. Clent Ridges, injection of Prolia injection given by Vickii Chafe on R abdominal tissue. Patient tolerated injection well.

## 2023-09-27 ENCOUNTER — Telehealth: Payer: Self-pay

## 2023-09-27 NOTE — Telephone Encounter (Signed)
Per md orders entered for [employer] and all supported documents faxed to [phone]. Faxed confirmation was received.

## 2023-10-07 ENCOUNTER — Other Ambulatory Visit: Payer: Self-pay | Admitting: Family Medicine

## 2023-10-07 DIAGNOSIS — E7800 Pure hypercholesterolemia, unspecified: Secondary | ICD-10-CM

## 2023-10-08 ENCOUNTER — Ambulatory Visit: Payer: PPO | Admitting: Family Medicine

## 2023-10-09 NOTE — Therapy (Signed)
 OUTPATIENT PHYSICAL THERAPY ONCOLOGY EVALUATION  Patient Name: Dawn Booker MRN: [id number] DOB:12/01/1955, 68 y.o., female Today's Date: 10/10/2023   PT End of Session - 10/10/23 1448     Visit Number 1    Number of Visits 9    Date for PT Re-Evaluation 11/07/23    PT Start Time 1407    PT Stop Time 1451    PT Time Calculation (min) 44 min    Activity Tolerance Patient tolerated treatment well    Behavior During Therapy [facility] for tasks assessed/performed              Past Medical History:  Diagnosis Date   ADD (attention deficit disorder)    Anxiety    breast ca 2019   Left breast cancer   Depression    GERD (gastroesophageal reflux disease)    Hiatal hernia    History of radiation therapy 08/13/2018- 09/25/2018   Left Chest wall and IM nodes/ 50 Gy in 25 fractions, Left supraclaicular fossa and PAB/ 50 Gy in 25 fractions, Left chest wall scar boost/ 10 Gy in 5 fractions.    Hyperlipemia, mixed    Intermittent palpitations    Malignant neoplasm of overlapping sites of left breast in female, estrogen receptor positive (HCC) 05/06/2018   oncologist-  dr odean--  Invasive Lobular Cancer, CIS, Stage IB, Grade 2 (pT3,pN1a,cM0), ER+---  s/p  left mastectomy with sln dissections and right mastectomy (benign)   Osteoporosis    PONV (postoperative nausea and vomiting)    Right thyroid  nodule    Shingles 12/2017   Tennis elbow    Past Surgical History:  Procedure Laterality Date   AXILLARY LYMPH NODE DISSECTION Left 07/09/2018   Procedure: COMPLETION OF LEFT AXILLARY LYMPH NODE DISSECTION;  Surgeon: Curvin Mt III, MD;  Location: [facility];  Service: General;  Laterality: Left;   BREAST EXCISIONAL BIOPSY Left 1995   benign   CARDIOVASCULAR STRESS TEST  06/21/2017   normal nuclear stress study w/ no ischemia/  normal LV function and wall function, nuclear stress ef 70%   ELBOW SURGERY Left child   MASTECTOMY W/ SENTINEL NODE BIOPSY Left 06/13/2018   MASTECTOMY WITH  RADIOACTIVE SEED GUIDED EXCISION AND AXILLARY SENTINEL LYMPH NODE BIOPSY Bilateral 06/13/2018   Procedure: LEFT MASTECTOMY WITH SENTINEL LYMPH NODE MAPPING AND TARGETED NODE DISSECTION AND RIGHT PROPHYLATIC MASTECTOMY;  Surgeon: Curvin Mt MOULD, MD;  Location: [facility] OR;  Service: General;  Laterality: Bilateral;   THYROIDECTOMY Left 10-31-2001   dr gerkin @[facility]    TRANSTHORACIC ECHOCARDIOGRAM  06/21/2017   ef 60-65%/  trivial MR (no evidence mvp)   TUBAL LIGATION  yrs ago   VAGINAL HYSTERECTOMY  1990s   Patient Active Problem List   Diagnosis Date Noted   Prediabetes 01/10/2022   Anal spasm 08/25/2020   Biliary dyskinesia 08/25/2020   Constipation 08/25/2020   Diaphragmatic hernia 08/25/2020   Glucose intolerance (impaired glucose tolerance) 06/26/2019   Hypercholesterolemia 06/26/2019   Vitamin D  deficiency 06/26/2019   Osteopenia after menopause 06/26/2019   H/O left mastectomy 06/09/2019   MDD (major depressive disorder), recurrent episode, moderate (HCC) 05/06/2019   GAD (generalized anxiety disorder) 05/06/2019   Insomnia due to mental condition 05/06/2019   Cancer of left female breast (HCC) 06/13/2018   H/O bilateral mastectomy 06/13/2018   Malignant neoplasm of overlapping sites of left breast in female, estrogen receptor positive (HCC) 05/09/2018   Blurring of visual image of both eyes 12/06/2017   Postherpetic neuralgia 12/06/2017   Nasal turbinate  hypertrophy 10/09/2016   Deviated septum 08/31/2016   Gastroesophageal reflux disease 10/10/2009    PCP: Claudette Gartner, PA-C  REFERRING PROVIDER: Crawford Morna Pickle, NP  REFERRING DIAG:  C50.812,Z17.0 (ICD-10-CM) - Malignant neoplasm of overlapping sites of left breast in female, estrogen receptor positive (HCC) Z90.13 (ICD-10-CM) - H/O bilateral mastectomy Z90.12 (ICD-10-CM) - H/O left mastectomy  THERAPY DIAG:  Lymphedema, not elsewhere classified  Other muscle spasm  Disorder of the skin and subcutaneous tissue  related to radiation, unspecified  Abnormal posture  Malignant neoplasm of overlapping sites of left breast in female, estrogen receptor positive (HCC)  ONSET DATE: 2019  Rationale for Evaluation and Treatment Rehabilitation  SUBJECTIVE                                                                                                                                                                                           SUBJECTIVE STATEMENT: Leah from [employer] said you would need to measure me for a new suit.   PERTINENT HISTORY:  Patient was diagnosed on 04/25/18 with left grade II invasive lobular carcinoma breast cancer. There are 2 areas: 2.6 cm in the upper outer quadrant and 6 mm in the upper inner quadrant. Both are ER/PR positive and HER2 negative with a Ki67 of 10% and she has a positive axillary node. Bilateral mastectomy on 06/13/2018 with  nodes removed and the 10 in second surgery on 07/09/2018 She had radiation and did well.   PAIN:  Are you having pain? No has tingling and stinging in the L chest and under the arm, has pain with movement on L lateral trunk   PRECAUTIONS: Other: at risk for lymphedema on L side  WEIGHT BEARING RESTRICTIONS No  FALLS:  Has patient fallen in last 6 months? No  LIVING ENVIRONMENT: Lives with: lives with their spouse Lives in: House/apartment Stairs: Yes; Internal: 13 steps; on right going up and External: 5 steps; can reach both Has following equipment at home: None  OCCUPATION: retired  LEISURE: pt does not exercise  HAND DOMINANCE : right   PRIOR LEVEL OF FUNCTION: Independent  PATIENT GOALS to try to get relief from discomfort in L lateral trunk and chest   OBJECTIVE  COGNITION:  Overall cognitive status: Within functional limits for tasks assessed   PALPATION: 1 cord palpable in L axilla, tightness palpable across L chest/pec   POSTURE: rounded head and forward shoulders   UPPER EXTREMITY AROM/PROM: WFL bilaterally     LYMPHEDEMA ASSESSMENTS:   SURGERY TYPE/DATE: 06/13/18  NUMBER OF LYMPH NODES REMOVED: 7 with surgery on 06/13/18 then another 10 during second surgery on 07/09/2018  CHEMOTHERAPY:  did not receive  RADIATION:completed  HORMONE TREATMENT: unable to tolerate  INFECTIONS: none  LYMPHEDEMA ASSESSMENTS:   LANDMARK RIGHT  eval  10 cm proximal to olecranon process 27  Olecranon process 23.5  10 cm proximal to ulnar styloid process 20  Just proximal to ulnar styloid process 14.5  Across hand at thumb web space 18.5  At base of 2nd digit 5.9  (Blank rows = not tested)  LANDMARK LEFT  eval  10 cm proximal to olecranon process 26.6  Olecranon process 22.5  10 cm proximal to ulnar styloid process 19.5  Just proximal to ulnar styloid process 14.6  Across hand at thumb web space 18  At base of 2nd digit 6.6  (Blank rows = not tested)    BREAST COMPLAINTS SURVEY: 7/80   TODAY'S TREATMENT  10/10/23: MFR to cord in L axilla with pt reporting tightness in this area, PROM to L shoulder in all directions with prolonged holds in abduction to stretch L pec, STM along mastectomy scar  PATIENT EDUCATION:  Education details: self massage with tennis ball on wall, use Flexi compression pump daily Person educated: Patient Education method: Explanation Education comprehension: verbalized understanding   HOME EXERCISE PROGRAM: Try self massage with tennis ball on wall Use Flexi compression pump daily  ASSESSMENT:  CLINICAL IMPRESSION: Patient is a 68 y.o. female who was seen today for physical therapy evaluation and treatment for left lateral trunk and chest discomfort as well as tingling/stinging pain in axilla. She has pain in L lateral trunk with certain movements. There is some minor lymphedema in her left lateral trunk. She has cording present in L axilla that when palpated does re create some of the tingling across her chest. Educated pt on importance of stretching to help  improve comfort.  Pt would benefit from skilled PT services to decrease L lateral trunk and chest pain and to help pt with independent management of her lymphedema.     OBJECTIVE IMPAIRMENTS increased edema, increased fascial restrictions, increased muscle spasms, postural dysfunction, and pain.   ACTIVITY LIMITATIONS turning in certain directions  PARTICIPATION LIMITATIONS:  none  PERSONAL FACTORS  none  are also affecting patient's functional outcome.   REHAB POTENTIAL: Good  CLINICAL DECISION MAKING: Stable/uncomplicated  EVALUATION COMPLEXITY: Low  GOALS: Goals reviewed with patient? Yes  SHORT TERM GOALS = LONG TERM GOALS  Target date: 11/07/2023    Pt will report a 75% improvement in pain and discomfort in L lateral trunk when turning body. Baseline: Goal status: INITIAL  2.  Pt will be independent in a home exercise program for continued stretching to decrease muscle tightness in pecs and lateral trunk  Baseline:  Goal status: INITIAL  3. Pt will report a 75% improvement in tingling and stinging sensation in L chest to allow improved comfort.   Baseline:  Goat status: INITIAL  PLAN: PT FREQUENCY: 2x/week  PT DURATION: 4 weeks  PLANNED INTERVENTIONS: 97164- PT Re-evaluation, 97110-Therapeutic exercises, 97530- Therapeutic activity, 97112- Neuromuscular re-education, 97535- Self Care, [id number]- Manual therapy, 97760- Orthotic Fit/training, 97016- Vasopneumatic device, Patient/Family education, Manual lymph drainage, Scar mobilization, and Compression bandaging  PLAN FOR NEXT SESSION: STM to L lateral trunk, stretching for lats and serratus, pec stretches on foam roll, measure for new flexi, MFR to cording in L axilla   [employer], PT 10/10/2023, 2:57 PM

## 2023-10-10 ENCOUNTER — Ambulatory Visit: Payer: PPO | Attending: Adult Health | Admitting: Physical Therapy

## 2023-10-10 ENCOUNTER — Other Ambulatory Visit: Payer: Self-pay

## 2023-10-10 ENCOUNTER — Encounter: Payer: Self-pay | Admitting: Family Medicine

## 2023-10-10 ENCOUNTER — Encounter: Payer: Self-pay | Admitting: Physical Therapy

## 2023-10-10 DIAGNOSIS — Z9012 Acquired absence of left breast and nipple: Secondary | ICD-10-CM | POA: Insufficient documentation

## 2023-10-10 DIAGNOSIS — M62838 Other muscle spasm: Secondary | ICD-10-CM

## 2023-10-10 DIAGNOSIS — I890 Lymphedema, not elsewhere classified: Secondary | ICD-10-CM

## 2023-10-10 DIAGNOSIS — R293 Abnormal posture: Secondary | ICD-10-CM | POA: Diagnosis present

## 2023-10-10 DIAGNOSIS — Z170 Estrogen receptor positive status [ER+]: Secondary | ICD-10-CM | POA: Insufficient documentation

## 2023-10-10 DIAGNOSIS — L599 Disorder of the skin and subcutaneous tissue related to radiation, unspecified: Secondary | ICD-10-CM | POA: Diagnosis present

## 2023-10-10 DIAGNOSIS — Z9013 Acquired absence of bilateral breasts and nipples: Secondary | ICD-10-CM | POA: Diagnosis not present

## 2023-10-10 DIAGNOSIS — C50812 Malignant neoplasm of overlapping sites of left female breast: Secondary | ICD-10-CM

## 2023-10-15 ENCOUNTER — Ambulatory Visit: Payer: PPO | Admitting: Rehabilitation

## 2023-10-15 ENCOUNTER — Encounter: Payer: Self-pay | Admitting: Rehabilitation

## 2023-10-15 DIAGNOSIS — R293 Abnormal posture: Secondary | ICD-10-CM

## 2023-10-15 DIAGNOSIS — L599 Disorder of the skin and subcutaneous tissue related to radiation, unspecified: Secondary | ICD-10-CM

## 2023-10-15 DIAGNOSIS — I890 Lymphedema, not elsewhere classified: Secondary | ICD-10-CM | POA: Diagnosis not present

## 2023-10-15 DIAGNOSIS — M62838 Other muscle spasm: Secondary | ICD-10-CM

## 2023-10-15 DIAGNOSIS — Z170 Estrogen receptor positive status [ER+]: Secondary | ICD-10-CM

## 2023-10-15 NOTE — Therapy (Signed)
OUTPATIENT PHYSICAL THERAPY ONCOLOGY    Patient Name: Dawn Booker MRN: [id number] DOB:[birth year], 68 y.o., female Today's Date: 10/15/2023   PT End of Session - 10/15/23 1149     Visit Number 2    Number of Visits 9    Date for PT Re-Evaluation 11/07/23    PT Start Time 1159    PT Stop Time 1246    PT Time Calculation (min) 47 min    Activity Tolerance Patient tolerated treatment well    Behavior During Therapy [facility] for tasks assessed/performed              Past Medical History:  Diagnosis Date   ADD (attention deficit disorder)    Anxiety    breast ca 2019   Left breast cancer   Depression    GERD (gastroesophageal reflux disease)    Hiatal hernia    History of radiation therapy 08/13/2018- 09/25/2018   Left Chest wall and IM nodes/ 50 Gy in 25 fractions, Left supraclaicular fossa and PAB/ 50 Gy in 25 fractions, Left chest wall scar boost/ 10 Gy in 5 fractions.    Hyperlipemia, mixed    Intermittent palpitations    Malignant neoplasm of overlapping sites of left breast in female, estrogen receptor positive (HCC) 05/06/2018   oncologist-  dr Pamelia Hoit--  Invasive Lobular Cancer, CIS, Stage IB, Grade 2 (pT3,pN1a,cM0), ER+---  s/p  left mastectomy with sln dissections and right mastectomy (benign)   Osteoporosis    PONV (postoperative nausea and vomiting)    Right thyroid nodule    Shingles 12/2017   Tennis elbow    Past Surgical History:  Procedure Laterality Date   AXILLARY LYMPH NODE DISSECTION Left 07/09/2018   Procedure: COMPLETION OF LEFT AXILLARY LYMPH NODE DISSECTION;  Surgeon: Chevis Pretty III, MD;  Location: [facility];  Service: General;  Laterality: Left;   BREAST EXCISIONAL BIOPSY Left 1995   benign   CARDIOVASCULAR STRESS TEST  06/21/2017   normal nuclear stress study w/ no ischemia/  normal LV function and wall function, nuclear stress ef 70%   ELBOW SURGERY Left child   MASTECTOMY W/ SENTINEL NODE BIOPSY Left 06/13/2018   MASTECTOMY WITH RADIOACTIVE SEED  GUIDED EXCISION AND AXILLARY SENTINEL LYMPH NODE BIOPSY Bilateral 06/13/2018   Procedure: LEFT MASTECTOMY WITH SENTINEL LYMPH NODE MAPPING AND TARGETED NODE DISSECTION AND RIGHT PROPHYLATIC MASTECTOMY;  Surgeon: Griselda Miner, MD;  Location: [facility];  Service: General;  Laterality: Bilateral;   THYROIDECTOMY Left 10-31-2001   dr gerkin @[facility]    TRANSTHORACIC ECHOCARDIOGRAM  06/21/2017   ef 60-65%/  trivial MR (no evidence mvp)   TUBAL LIGATION  yrs ago   VAGINAL HYSTERECTOMY  1990s   Patient Active Problem List   Diagnosis Date Noted   Prediabetes 01/10/2022   Anal spasm 08/25/2020   Biliary dyskinesia 08/25/2020   Constipation 08/25/2020   Diaphragmatic hernia 08/25/2020   Glucose intolerance (impaired glucose tolerance) 06/26/2019   Hypercholesterolemia 06/26/2019   Vitamin D deficiency 06/26/2019   Osteopenia after menopause 06/26/2019   H/O left mastectomy 06/09/2019   MDD (major depressive disorder), recurrent episode, moderate (HCC) 05/06/2019   GAD (generalized anxiety disorder) 05/06/2019   Insomnia due to mental condition 05/06/2019   Cancer of left female breast (HCC) 06/13/2018   H/O bilateral mastectomy 06/13/2018   Malignant neoplasm of overlapping sites of left breast in female, estrogen receptor positive (HCC) 05/09/2018   Blurring of visual image of both eyes 12/06/2017   Postherpetic neuralgia 12/06/2017   Nasal  turbinate hypertrophy 10/09/2016   Deviated septum 08/31/2016   Gastroesophageal reflux disease 10/10/2009    PCP: Mayer Masker, PA-C  REFERRING PROVIDER: Loa Socks, NP  REFERRING DIAG:  C50.812,Z17.0 (ICD-10-CM) - Malignant neoplasm of overlapping sites of left breast in female, estrogen receptor positive (HCC) Z90.13 (ICD-10-CM) - H/O bilateral mastectomy Z90.12 (ICD-10-CM) - H/O left mastectomy  THERAPY DIAG:  Lymphedema, not elsewhere classified  Other muscle spasm  Disorder of the skin and subcutaneous tissue related to  radiation, unspecified  Abnormal posture  Malignant neoplasm of overlapping sites of left breast in female, estrogen receptor positive (HCC)  ONSET DATE: 2019  Rationale for Evaluation and Treatment Rehabilitation  SUBJECTIVE                                                                                                                                                                                           SUBJECTIVE STATEMENT:  It felt fine after last time - it actually felt better.    PERTINENT HISTORY:  Patient was diagnosed on 04/25/18 with left grade II invasive lobular carcinoma breast cancer. There are 2 areas: 2.6 cm in the upper outer quadrant and 6 mm in the upper inner quadrant. Both are ER/PR positive and HER2 negative with a Ki67 of 10% and she has a positive axillary node. Bilateral mastectomy on 06/13/2018 with  nodes removed and the 10 in second surgery on 07/09/2018 She had radiation and did well.   PAIN:  Are you having pain? No has tingling and stinging in the L chest and under the arm, has pain with movement on L lateral trunk   PRECAUTIONS: Other: at risk for lymphedema on L side  WEIGHT BEARING RESTRICTIONS No  FALLS:  Has patient fallen in last 6 months? No  LIVING ENVIRONMENT: Lives with: lives with their spouse Lives in: House/apartment Stairs: Yes; Internal: 13 steps; on right going up and External: 5 steps; can reach both Has following equipment at home: None  OCCUPATION: retired  LEISURE: pt does not exercise  HAND DOMINANCE : right   PRIOR LEVEL OF FUNCTION: Independent  PATIENT GOALS to try to get relief from discomfort in L lateral trunk and chest   OBJECTIVE  COGNITION:  Overall cognitive status: Within functional limits for tasks assessed   PALPATION: 1 cord palpable in L axilla, tightness palpable across L chest/pec  POSTURE: rounded head and forward shoulders   UPPER EXTREMITY AROM/PROM: WFL bilaterally   LYMPHEDEMA  ASSESSMENTS:  SURGERY TYPE/DATE: 06/13/18 NUMBER OF LYMPH NODES REMOVED: 7 with surgery on 06/13/18 then another 10 during second surgery on 07/09/2018 CHEMOTHERAPY: did not receive RADIATION:completed HORMONE  TREATMENT: unable to tolerate INFECTIONS: none  LYMPHEDEMA ASSESSMENTS:   LANDMARK RIGHT  eval  10 cm proximal to olecranon process 27  Olecranon process 23.5  10 cm proximal to ulnar styloid process 20  Just proximal to ulnar styloid process 14.5  Across hand at thumb web space 18.5  At base of 2nd digit 5.9  (Blank rows = not tested)  LANDMARK LEFT  eval  10 cm proximal to olecranon process 26.6  Olecranon process 22.5  10 cm proximal to ulnar styloid process 19.5  Just proximal to ulnar styloid process 14.6  Across hand at thumb web space 18  At base of 2nd digit 6.6  (Blank rows = not tested)   BREAST COMPLAINTS SURVEY: 7/80  TODAY'S TREATMENT  Pt permission and consent throughout each step of examination and treatment with modification and draping if requested when working on sensitive areas  10/15/23:  MFR to cord in L axilla with pt reporting tightness in this area, PROM to L shoulder in all directions with prolonged holds in abduction to stretch L pec, STM along mastectomy scar Sidelying STM to serratus and latissimus  Sidelying abduction AROM x 6  10/10/23: MFR to cord in L axilla with pt reporting tightness in this area, PROM to L shoulder in all directions with prolonged holds in abduction to stretch L pec, STM along mastectomy scar  PATIENT EDUCATION:  Education details: self massage with tennis ball on wall, use Flexi compression pump daily Person educated: Patient Education method: Explanation Education comprehension: verbalized understanding  HOME EXERCISE PROGRAM: Try self massage with tennis ball on wall Use Flexi compression pump daily  ASSESSMENT:  CLINICAL IMPRESSION: Pt notes some improvements already and cording was hard to find today  during palpation.  Emailed flexitouch to clarify what she needs measured.   OBJECTIVE IMPAIRMENTS increased edema, increased fascial restrictions, increased muscle spasms, postural dysfunction, and pain.   ACTIVITY LIMITATIONS turning in certain directions  PARTICIPATION LIMITATIONS:  none  PERSONAL FACTORS  none  are also affecting patient's functional outcome.   REHAB POTENTIAL: Good  CLINICAL DECISION MAKING: Stable/uncomplicated  EVALUATION COMPLEXITY: Low  GOALS: Goals reviewed with patient? Yes  SHORT TERM GOALS = LONG TERM GOALS  Target date: 11/07/2023    Pt will report a 75% improvement in pain and discomfort in L lateral trunk when turning body. Baseline: Goal status: INITIAL  2.  Pt will be independent in a home exercise program for continued stretching to decrease muscle tightness in pecs and lateral trunk  Baseline:  Goal status: INITIAL  3. Pt will report a 75% improvement in tingling and stinging sensation in L chest to allow improved comfort.   Baseline:  Goat status: INITIAL  PLAN: PT FREQUENCY: 2x/week  PT DURATION: 4 weeks  PLANNED INTERVENTIONS: 97164- PT Re-evaluation, 97110-Therapeutic exercises, 97530- Therapeutic activity, 97112- Neuromuscular re-education, 97535- Self Care, 16109- Manual therapy, 97760- Orthotic Fit/training, 97016- Vasopneumatic device, Patient/Family education, Manual lymph drainage, Scar mobilization, and Compression bandaging  PLAN FOR NEXT SESSION: STM to L lateral trunk, stretching for lats and serratus, pec stretches on foam roll, measure for new flexi, MFR to cording in L axilla   Rubel Heckard, Julieanne Manson, PT 10/15/2023, 12:48 PM

## 2023-10-17 ENCOUNTER — Ambulatory Visit: Payer: PPO | Admitting: Physical Therapy

## 2023-10-17 ENCOUNTER — Encounter: Payer: Self-pay | Admitting: Physical Therapy

## 2023-10-17 DIAGNOSIS — I890 Lymphedema, not elsewhere classified: Secondary | ICD-10-CM | POA: Diagnosis not present

## 2023-10-17 DIAGNOSIS — R293 Abnormal posture: Secondary | ICD-10-CM

## 2023-10-17 DIAGNOSIS — C50812 Malignant neoplasm of overlapping sites of left female breast: Secondary | ICD-10-CM

## 2023-10-17 DIAGNOSIS — M62838 Other muscle spasm: Secondary | ICD-10-CM

## 2023-10-17 DIAGNOSIS — L599 Disorder of the skin and subcutaneous tissue related to radiation, unspecified: Secondary | ICD-10-CM

## 2023-10-17 NOTE — Therapy (Signed)
OUTPATIENT PHYSICAL THERAPY ONCOLOGY    Patient Name: Dawn Booker MRN: [id number] DOB:July 27, 1956, 68 y.o., female Today's Date: 10/17/2023   PT End of Session - 10/17/23 1406     Visit Number 3    Number of Visits 9    Date for PT Re-Evaluation 11/07/23    PT Start Time 1405    PT Stop Time 1450    PT Time Calculation (min) 45 min    Activity Tolerance Patient tolerated treatment well    Behavior During Therapy [facility] for tasks assessed/performed              Past Medical History:  Diagnosis Date   ADD (attention deficit disorder)    Anxiety    breast ca 2019   Left breast cancer   Depression    GERD (gastroesophageal reflux disease)    Hiatal hernia    History of radiation therapy 08/13/2018- 09/25/2018   Left Chest wall and IM nodes/ 50 Gy in 25 fractions, Left supraclaicular fossa and PAB/ 50 Gy in 25 fractions, Left chest wall scar boost/ 10 Gy in 5 fractions.    Hyperlipemia, mixed    Intermittent palpitations    Malignant neoplasm of overlapping sites of left breast in female, estrogen receptor positive (HCC) 05/06/2018   oncologist-  dr Pamelia Hoit--  Invasive Lobular Cancer, CIS, Stage IB, Grade 2 (pT3,pN1a,cM0), ER+---  s/p  left mastectomy with sln dissections and right mastectomy (benign)   Osteoporosis    PONV (postoperative nausea and vomiting)    Right thyroid nodule    Shingles 12/2017   Tennis elbow    Past Surgical History:  Procedure Laterality Date   AXILLARY LYMPH NODE DISSECTION Left 07/09/2018   Procedure: COMPLETION OF LEFT AXILLARY LYMPH NODE DISSECTION;  Surgeon: Chevis Pretty III, MD;  Location: [facility];  Service: General;  Laterality: Left;   BREAST EXCISIONAL BIOPSY Left 1995   benign   CARDIOVASCULAR STRESS TEST  06/21/2017   normal nuclear stress study w/ no ischemia/  normal LV function and wall function, nuclear stress ef 70%   ELBOW SURGERY Left child   MASTECTOMY W/ SENTINEL NODE BIOPSY Left 06/13/2018   MASTECTOMY WITH RADIOACTIVE SEED  GUIDED EXCISION AND AXILLARY SENTINEL LYMPH NODE BIOPSY Bilateral 06/13/2018   Procedure: LEFT MASTECTOMY WITH SENTINEL LYMPH NODE MAPPING AND TARGETED NODE DISSECTION AND RIGHT PROPHYLATIC MASTECTOMY;  Surgeon: Griselda Miner, MD;  Location: [facility];  Service: General;  Laterality: Bilateral;   THYROIDECTOMY Left 10-31-2001   dr gerkin @[facility]    TRANSTHORACIC ECHOCARDIOGRAM  06/21/2017   ef 60-65%/  trivial MR (no evidence mvp)   TUBAL LIGATION  yrs ago   VAGINAL HYSTERECTOMY  1990s   Patient Active Problem List   Diagnosis Date Noted   Prediabetes 01/10/2022   Anal spasm 08/25/2020   Biliary dyskinesia 08/25/2020   Constipation 08/25/2020   Diaphragmatic hernia 08/25/2020   Glucose intolerance (impaired glucose tolerance) 06/26/2019   Hypercholesterolemia 06/26/2019   Vitamin D deficiency 06/26/2019   Osteopenia after menopause 06/26/2019   H/O left mastectomy 06/09/2019   MDD (major depressive disorder), recurrent episode, moderate (HCC) 05/06/2019   GAD (generalized anxiety disorder) 05/06/2019   Insomnia due to mental condition 05/06/2019   Cancer of left female breast (HCC) 06/13/2018   H/O bilateral mastectomy 06/13/2018   Malignant neoplasm of overlapping sites of left breast in female, estrogen receptor positive (HCC) 05/09/2018   Blurring of visual image of both eyes 12/06/2017   Postherpetic neuralgia 12/06/2017   Nasal  turbinate hypertrophy 10/09/2016   Deviated septum 08/31/2016   Gastroesophageal reflux disease 10/10/2009    PCP: Mayer Masker, PA-C  REFERRING PROVIDER: Loa Socks, NP  REFERRING DIAG:  C50.812,Z17.0 (ICD-10-CM) - Malignant neoplasm of overlapping sites of left breast in female, estrogen receptor positive (HCC) Z90.13 (ICD-10-CM) - H/O bilateral mastectomy Z90.12 (ICD-10-CM) - H/O left mastectomy  THERAPY DIAG:  Lymphedema, not elsewhere classified  Other muscle spasm  Disorder of the skin and subcutaneous tissue related to  radiation, unspecified  Abnormal posture  Malignant neoplasm of overlapping sites of left breast in female, estrogen receptor positive (HCC)  ONSET DATE: 2019  Rationale for Evaluation and Treatment Rehabilitation  SUBJECTIVE                                                                                                                                                                                           SUBJECTIVE STATEMENT: The pain has been better.    PERTINENT HISTORY:  Patient was diagnosed on 04/25/18 with left grade II invasive lobular carcinoma breast cancer. There are 2 areas: 2.6 cm in the upper outer quadrant and 6 mm in the upper inner quadrant. Both are ER/PR positive and HER2 negative with a Ki67 of 10% and she has a positive axillary node. Bilateral mastectomy on 06/13/2018 with  nodes removed and the 10 in second surgery on 07/09/2018 She had radiation and did well.   PAIN:  Are you having pain? No   PRECAUTIONS: Other: at risk for lymphedema on L side  WEIGHT BEARING RESTRICTIONS No  FALLS:  Has patient fallen in last 6 months? No  LIVING ENVIRONMENT: Lives with: lives with their spouse Lives in: House/apartment Stairs: Yes; Internal: 13 steps; on right going up and External: 5 steps; can reach both Has following equipment at home: None  OCCUPATION: retired  LEISURE: pt does not exercise  HAND DOMINANCE : right   PRIOR LEVEL OF FUNCTION: Independent  PATIENT GOALS to try to get relief from discomfort in L lateral trunk and chest   OBJECTIVE  COGNITION:  Overall cognitive status: Within functional limits for tasks assessed   PALPATION: 1 cord palpable in L axilla, tightness palpable across L chest/pec  POSTURE: rounded head and forward shoulders   UPPER EXTREMITY AROM/PROM: WFL bilaterally   LYMPHEDEMA ASSESSMENTS:  SURGERY TYPE/DATE: 06/13/18 NUMBER OF LYMPH NODES REMOVED: 7 with surgery on 06/13/18 then another 10 during second surgery on  07/09/2018 CHEMOTHERAPY: did not receive RADIATION:completed HORMONE TREATMENT: unable to tolerate INFECTIONS: none  LYMPHEDEMA ASSESSMENTS:   LANDMARK RIGHT  eval  10 cm proximal to olecranon process 27  Olecranon process 23.5  10 cm proximal to ulnar styloid process 20  Just proximal to ulnar styloid process 14.5  Across hand at thumb web space 18.5  At base of 2nd digit 5.9  (Blank rows = not tested)  LANDMARK LEFT  eval  10 cm proximal to olecranon process 26.6  Olecranon process 22.5  10 cm proximal to ulnar styloid process 19.5  Just proximal to ulnar styloid process 14.6  Across hand at thumb web space 18  At base of 2nd digit 6.6  (Blank rows = not tested)   BREAST COMPLAINTS SURVEY: 7/80  TODAY'S TREATMENT  Pt permission and consent throughout each step of examination and treatment with modification and draping if requested when working on sensitive areas  10/17/23:  MFR to cording in L axilla with 2 cords palpable today but pt only having minimal discomfort with this today Supine over foam roll: alternating flexion x 10 bilaterally, scaption bilaterally x 10 with 3 sec holds, snow angels x 10, lying with UEs in horizontal abduction x 60 sec hold  10/15/23:  MFR to cord in L axilla with pt reporting tightness in this area, PROM to L shoulder in all directions with prolonged holds in abduction to stretch L pec, STM along mastectomy scar Sidelying STM to serratus and latissimus  Sidelying abduction AROM x 6  10/10/23: MFR to cord in L axilla with pt reporting tightness in this area, PROM to L shoulder in all directions with prolonged holds in abduction to stretch L pec, STM along mastectomy scar  PATIENT EDUCATION:  Education details: self massage with tennis ball on wall, use Flexi compression pump daily Person educated: Patient Education method: Explanation Education comprehension: verbalized understanding  HOME EXERCISE PROGRAM: Try self massage with tennis  ball on wall Use Flexi compression pump daily  ASSESSMENT:  CLINICAL IMPRESSION: Pt reports the cording has not been causing discomfort since last session. There are still 2 cords palpable but today it did not radiate pain during MFR and pt had minimal discomfort. Instruct pt in pec stretches today on foam roll.   OBJECTIVE IMPAIRMENTS increased edema, increased fascial restrictions, increased muscle spasms, postural dysfunction, and pain.   ACTIVITY LIMITATIONS turning in certain directions  PARTICIPATION LIMITATIONS:  none  PERSONAL FACTORS  none  are also affecting patient's functional outcome.   REHAB POTENTIAL: Good  CLINICAL DECISION MAKING: Stable/uncomplicated  EVALUATION COMPLEXITY: Low  GOALS: Goals reviewed with patient? Yes  SHORT TERM GOALS = LONG TERM GOALS  Target date: 11/07/2023    Pt will report a 75% improvement in pain and discomfort in L lateral trunk when turning body. Baseline: Goal status: INITIAL  2.  Pt will be independent in a home exercise program for continued stretching to decrease muscle tightness in pecs and lateral trunk  Baseline:  Goal status: INITIAL  3. Pt will report a 75% improvement in tingling and stinging sensation in L chest to allow improved comfort.   Baseline:  Goat status: INITIAL  PLAN: PT FREQUENCY: 2x/week  PT DURATION: 4 weeks  PLANNED INTERVENTIONS: 97164- PT Re-evaluation, 97110-Therapeutic exercises, 97530- Therapeutic activity, 97112- Neuromuscular re-education, 97535- Self Care, 16109- Manual therapy, 97760- Orthotic Fit/training, 97016- Vasopneumatic device, Patient/Family education, Manual lymph drainage, Scar mobilization, and Compression bandaging  PLAN FOR NEXT SESSION: stretching for lats and serratus, pec stretches on foam roll, MFR to cording in L axilla   [employer], PT 10/17/2023, 2:56 PM

## 2023-10-17 NOTE — Patient Instructions (Signed)
Lie over foam roll with knees bent and feet flat: raise arms overhead and alternate to tap mat x 10     2. In a "V" shape: and hold 3-5 sec working up to 30-60 x 10    3. Snow angel: try to keep hands and arms in contact with mat       X 10 with 3-5 sec holds working up to 30-60 sec    4. With arms outstretched for pec stretch with 60 sec holds

## 2023-10-22 ENCOUNTER — Ambulatory Visit: Payer: PPO | Admitting: Physical Therapy

## 2023-10-22 ENCOUNTER — Encounter: Payer: Self-pay | Admitting: Physical Therapy

## 2023-10-22 DIAGNOSIS — C50812 Malignant neoplasm of overlapping sites of left female breast: Secondary | ICD-10-CM

## 2023-10-22 DIAGNOSIS — R293 Abnormal posture: Secondary | ICD-10-CM

## 2023-10-22 DIAGNOSIS — L599 Disorder of the skin and subcutaneous tissue related to radiation, unspecified: Secondary | ICD-10-CM

## 2023-10-22 DIAGNOSIS — I890 Lymphedema, not elsewhere classified: Secondary | ICD-10-CM

## 2023-10-22 DIAGNOSIS — M62838 Other muscle spasm: Secondary | ICD-10-CM

## 2023-10-22 NOTE — Therapy (Signed)
 OUTPATIENT PHYSICAL THERAPY ONCOLOGY    Patient Name: Dawn Booker MRN: [id number] DOB:03-12-1956, 68 y.o., female Today's Date: 10/22/2023   PT End of Session - 10/22/23 1205     Visit Number 4    Number of Visits 9    Date for PT Re-Evaluation 11/07/23    PT Start Time 1203    PT Stop Time 1236    PT Time Calculation (min) 33 min    Activity Tolerance Patient tolerated treatment well    Behavior During Therapy [facility] for tasks assessed/performed              Past Medical History:  Diagnosis Date   ADD (attention deficit disorder)    Anxiety    breast ca 2019   Left breast cancer   Depression    GERD (gastroesophageal reflux disease)    Hiatal hernia    History of radiation therapy 08/13/2018- 09/25/2018   Left Chest wall and IM nodes/ 50 Gy in 25 fractions, Left supraclaicular fossa and PAB/ 50 Gy in 25 fractions, Left chest wall scar boost/ 10 Gy in 5 fractions.    Hyperlipemia, mixed    Intermittent palpitations    Malignant neoplasm of overlapping sites of left breast in female, estrogen receptor positive (HCC) 05/06/2018   oncologist-  dr Pamelia Hoit--  Invasive Lobular Cancer, CIS, Stage IB, Grade 2 (pT3,pN1a,cM0), ER+---  s/p  left mastectomy with sln dissections and right mastectomy (benign)   Osteoporosis    PONV (postoperative nausea and vomiting)    Right thyroid nodule    Shingles 12/2017   Tennis elbow    Past Surgical History:  Procedure Laterality Date   AXILLARY LYMPH NODE DISSECTION Left 07/09/2018   Procedure: COMPLETION OF LEFT AXILLARY LYMPH NODE DISSECTION;  Surgeon: Chevis Pretty III, MD;  Location: [facility];  Service: General;  Laterality: Left;   BREAST EXCISIONAL BIOPSY Left 1995   benign   CARDIOVASCULAR STRESS TEST  06/21/2017   normal nuclear stress study w/ no ischemia/  normal LV function and wall function, nuclear stress ef 70%   ELBOW SURGERY Left child   MASTECTOMY W/ SENTINEL NODE BIOPSY Left 06/13/2018   MASTECTOMY WITH RADIOACTIVE SEED  GUIDED EXCISION AND AXILLARY SENTINEL LYMPH NODE BIOPSY Bilateral 06/13/2018   Procedure: LEFT MASTECTOMY WITH SENTINEL LYMPH NODE MAPPING AND TARGETED NODE DISSECTION AND RIGHT PROPHYLATIC MASTECTOMY;  Surgeon: Griselda Miner, MD;  Location: [facility] OR;  Service: General;  Laterality: Bilateral;   THYROIDECTOMY Left 10-31-2001   dr gerkin @[facility]    TRANSTHORACIC ECHOCARDIOGRAM  06/21/2017   ef 60-65%/  trivial MR (no evidence mvp)   TUBAL LIGATION  yrs ago   VAGINAL HYSTERECTOMY  1990s   Patient Active Problem List   Diagnosis Date Noted   Prediabetes 01/10/2022   Anal spasm 08/25/2020   Biliary dyskinesia 08/25/2020   Constipation 08/25/2020   Diaphragmatic hernia 08/25/2020   Glucose intolerance (impaired glucose tolerance) 06/26/2019   Hypercholesterolemia 06/26/2019   Vitamin D deficiency 06/26/2019   Osteopenia after menopause 06/26/2019   H/O left mastectomy 06/09/2019   MDD (major depressive disorder), recurrent episode, moderate (HCC) 05/06/2019   GAD (generalized anxiety disorder) 05/06/2019   Insomnia due to mental condition 05/06/2019   Cancer of left female breast (HCC) 06/13/2018   H/O bilateral mastectomy 06/13/2018   Malignant neoplasm of overlapping sites of left breast in female, estrogen receptor positive (HCC) 05/09/2018   Blurring of visual image of both eyes 12/06/2017   Postherpetic neuralgia 12/06/2017   Nasal  turbinate hypertrophy 10/09/2016   Deviated septum 08/31/2016   Gastroesophageal reflux disease 10/10/2009    PCP: Mayer Masker, PA-C  REFERRING PROVIDER: Loa Socks, NP  REFERRING DIAG:  C50.812,Z17.0 (ICD-10-CM) - Malignant neoplasm of overlapping sites of left breast in female, estrogen receptor positive (HCC) Z90.13 (ICD-10-CM) - H/O bilateral mastectomy Z90.12 (ICD-10-CM) - H/O left mastectomy  THERAPY DIAG:  Lymphedema, not elsewhere classified  Other muscle spasm  Disorder of the skin and subcutaneous tissue related to  radiation, unspecified  Abnormal posture  Malignant neoplasm of overlapping sites of left breast in female, estrogen receptor positive (HCC)  ONSET DATE: 2019  Rationale for Evaluation and Treatment Rehabilitation  SUBJECTIVE                                                                                                                                                                                           SUBJECTIVE STATEMENT: I am doing better. I am ready to be discharged.   PERTINENT HISTORY:  Patient was diagnosed on 04/25/18 with left grade II invasive lobular carcinoma breast cancer. There are 2 areas: 2.6 cm in the upper outer quadrant and 6 mm in the upper inner quadrant. Both are ER/PR positive and HER2 negative with a Ki67 of 10% and she has a positive axillary node. Bilateral mastectomy on 06/13/2018 with  nodes removed and the 10 in second surgery on 07/09/2018 She had radiation and did well.   PAIN:  Are you having pain? No   PRECAUTIONS: Other: at risk for lymphedema on L side  WEIGHT BEARING RESTRICTIONS No  FALLS:  Has patient fallen in last 6 months? No  LIVING ENVIRONMENT: Lives with: lives with their spouse Lives in: House/apartment Stairs: Yes; Internal: 13 steps; on right going up and External: 5 steps; can reach both Has following equipment at home: None  OCCUPATION: retired  LEISURE: pt does not exercise  HAND DOMINANCE : right   PRIOR LEVEL OF FUNCTION: Independent  PATIENT GOALS to try to get relief from discomfort in L lateral trunk and chest   OBJECTIVE  COGNITION:  Overall cognitive status: Within functional limits for tasks assessed   PALPATION: 1 cord palpable in L axilla, tightness palpable across L chest/pec  POSTURE: rounded head and forward shoulders   UPPER EXTREMITY AROM/PROM: WFL bilaterally   LYMPHEDEMA ASSESSMENTS:  SURGERY TYPE/DATE: 06/13/18 NUMBER OF LYMPH NODES REMOVED: 7 with surgery on 06/13/18 then another 10  during second surgery on 07/09/2018 CHEMOTHERAPY: did not receive RADIATION:completed HORMONE TREATMENT: unable to tolerate INFECTIONS: none  LYMPHEDEMA ASSESSMENTS:   LANDMARK RIGHT  eval  10 cm proximal to olecranon process 27  Olecranon process 23.5  10 cm proximal to ulnar styloid process 20  Just proximal to ulnar styloid process 14.5  Across hand at thumb web space 18.5  At base of 2nd digit 5.9  (Blank rows = not tested)  LANDMARK LEFT  eval  10 cm proximal to olecranon process 26.6  Olecranon process 22.5  10 cm proximal to ulnar styloid process 19.5  Just proximal to ulnar styloid process 14.6  Across hand at thumb web space 18  At base of 2nd digit 6.6  (Blank rows = not tested)   BREAST COMPLAINTS SURVEY: 7/80  TODAY'S TREATMENT  Pt permission and consent throughout each step of examination and treatment with modification and draping if requested when working on sensitive areas  10/22/23: Assessed goals in therapy MFR to L axilla and L pec with no cording palpable today Pt returned demonstrated 1 rep of each exercise for clarification and only required v/c for scaption because she was stopping at 90 degrees   10/17/23:  MFR to cording in L axilla with 2 cords palpable today but pt only having minimal discomfort with this today Supine over foam roll: alternating flexion x 10 bilaterally, scaption bilaterally x 10 with 3 sec holds, snow angels x 10, lying with UEs in horizontal abduction x 60 sec hold  10/15/23:  MFR to cord in L axilla with pt reporting tightness in this area, PROM to L shoulder in all directions with prolonged holds in abduction to stretch L pec, STM along mastectomy scar Sidelying STM to serratus and latissimus  Sidelying abduction AROM x 6  10/10/23: MFR to cord in L axilla with pt reporting tightness in this area, PROM to L shoulder in all directions with prolonged holds in abduction to stretch L pec, STM along mastectomy scar  PATIENT  EDUCATION:  Education details: self massage with tennis ball on wall, use Flexi compression pump daily Person educated: Patient Education method: Explanation Education comprehension: verbalized understanding  HOME EXERCISE PROGRAM: Try self massage with tennis ball on wall Use Flexi compression pump daily  ASSESSMENT:  CLINICAL IMPRESSION: Pt has been doing her new exercises. She is no longer having tingling and discomfort. She no longer has cording in her L axilla. She has met all goals for therapy and is ready to be discharged from skilled PT services at this time.   OBJECTIVE IMPAIRMENTS increased edema, increased fascial restrictions, increased muscle spasms, postural dysfunction, and pain.   ACTIVITY LIMITATIONS turning in certain directions  PARTICIPATION LIMITATIONS:  none  PERSONAL FACTORS  none  are also affecting patient's functional outcome.   REHAB POTENTIAL: Good  CLINICAL DECISION MAKING: Stable/uncomplicated  EVALUATION COMPLEXITY: Low  GOALS: Goals reviewed with patient? Yes  SHORT TERM GOALS = LONG TERM GOALS  Target date: 11/07/2023    Pt will report a 75% improvement in pain and discomfort in L lateral trunk when turning body. Baseline: Goal status: MET 10/22/23- 95%  2.  Pt will be independent in a home exercise program for continued stretching to decrease muscle tightness in pecs and lateral trunk  Baseline:  Goal status: MET  3. Pt will report a 75% improvement in tingling and stinging sensation in L chest to allow improved comfort.   Baseline:  [place] status: MET 100% improvement  PLAN: PT FREQUENCY: 2x/week  PT DURATION: 4 weeks  PLANNED INTERVENTIONS: 97164- PT Re-evaluation, 97110-Therapeutic exercises, 97530- Therapeutic activity, 97112- Neuromuscular re-education, 97535- Self Care, 40981- Manual therapy, 97760- Orthotic Fit/training, [id number]- Vasopneumatic device, Patient/Family education, Manual  lymph drainage, Scar mobilization, and  Compression bandaging  PLAN FOR NEXT SESSION: d/c this session   [employer], PT 10/22/2023, 1:02 PM  PHYSICAL THERAPY DISCHARGE SUMMARY  Visits from Start of Care: 4  Current functional level related to goals / functional outcomes: All goals met   Remaining deficits: None   Education / Equipment: HEP   Patient agrees to discharge. Patient goals were met. Patient is being discharged due to meeting the stated rehab goals.  Milagros Loll Murray, [place] 10/22/23 1:02 PM

## 2023-10-24 ENCOUNTER — Encounter: Payer: PPO | Admitting: Physical Therapy

## 2023-10-24 ENCOUNTER — Telehealth: Payer: Self-pay | Admitting: *Deleted

## 2023-10-24 ENCOUNTER — Encounter: Payer: Self-pay | Admitting: Hematology and Oncology

## 2023-10-24 NOTE — Telephone Encounter (Signed)
Per MD request RN placed call to pt with recent Guardant Reveal results being negative.  No answer, LVM for pt to return call to the office.

## 2023-10-29 ENCOUNTER — Encounter: Payer: PPO | Admitting: Physical Therapy

## 2023-10-31 ENCOUNTER — Encounter: Payer: PPO | Admitting: Physical Therapy

## 2023-11-05 ENCOUNTER — Encounter: Payer: PPO | Admitting: Physical Therapy

## 2023-11-07 ENCOUNTER — Encounter: Payer: PPO | Admitting: Physical Therapy

## 2023-11-10 ENCOUNTER — Other Ambulatory Visit: Payer: Self-pay | Admitting: Family

## 2023-11-10 DIAGNOSIS — F411 Generalized anxiety disorder: Secondary | ICD-10-CM

## 2023-11-10 DIAGNOSIS — F5105 Insomnia due to other mental disorder: Secondary | ICD-10-CM

## 2023-11-10 DIAGNOSIS — F331 Major depressive disorder, recurrent, moderate: Secondary | ICD-10-CM

## 2023-11-11 ENCOUNTER — Encounter: Payer: Self-pay | Admitting: Family Medicine

## 2023-11-13 ENCOUNTER — Other Ambulatory Visit: Payer: Self-pay | Admitting: Family Medicine

## 2023-11-13 DIAGNOSIS — F331 Major depressive disorder, recurrent, moderate: Secondary | ICD-10-CM

## 2023-11-13 DIAGNOSIS — F5105 Insomnia due to other mental disorder: Secondary | ICD-10-CM

## 2023-11-13 DIAGNOSIS — F411 Generalized anxiety disorder: Secondary | ICD-10-CM

## 2023-11-13 MED ORDER — TEMAZEPAM 30 MG PO CAPS
ORAL_CAPSULE | ORAL | 0 refills | Status: DC
Start: 2023-11-13 — End: 2023-11-29

## 2023-11-13 NOTE — Telephone Encounter (Signed)
 Copied from CRM [id number]. Topic: Clinical - Prescription Issue >> Nov 13, 2023 10:39 AM Denese Killings wrote: Reason for CRM: Patient is checking the status of temazepam (RESTORIL) 30 MG capsule prescription.

## 2023-11-21 ENCOUNTER — Telehealth: Payer: Self-pay | Admitting: *Deleted

## 2023-11-21 NOTE — Telephone Encounter (Signed)
 Received call from pt regarding recent billing showing a $20 copay as well as a $160 clinic fee.  Pt wanting to discuss what the clinic fee entails.  RN sent message to billing team for further investigation and to reach out to pt.

## 2023-11-29 ENCOUNTER — Other Ambulatory Visit: Payer: Self-pay | Admitting: Family Medicine

## 2023-11-29 ENCOUNTER — Other Ambulatory Visit: Payer: Self-pay

## 2023-11-29 DIAGNOSIS — F411 Generalized anxiety disorder: Secondary | ICD-10-CM

## 2023-11-29 DIAGNOSIS — F331 Major depressive disorder, recurrent, moderate: Secondary | ICD-10-CM

## 2023-11-29 DIAGNOSIS — F5105 Insomnia due to other mental disorder: Secondary | ICD-10-CM

## 2023-11-29 MED ORDER — TEMAZEPAM 30 MG PO CAPS
ORAL_CAPSULE | ORAL | 0 refills | Status: DC
Start: 2023-11-29 — End: 2024-02-25

## 2023-12-09 ENCOUNTER — Ambulatory Visit: Payer: Self-pay

## 2023-12-09 NOTE — Telephone Encounter (Signed)
 Chief Complaint: covid Symptoms:  Fatigue, cough, fever 99.9, body aches sore throat Frequency: x 1 day Pertinent Negatives: Patient denies chest pain or sob Disposition: [] ED /[] Urgent Care (no appt availability in office) / [] Appointment(In office/virtual)/ []  [facility] [facility]/ [x] Home Care/ [] Refused Recommended Disposition /[] [facility]/ []  Follow-up with PCP   Additional Notes: pt states that she went on a trip last week and on Saturday she started having  Fatigue, cough, fever 99.9, body aches and this morning she took a home test and was positive for covid.  Pt states symptoms are mild with no sob or chest pain or wheezing. Home care given and instructed to call back if symptoms worsen.    Copied From CRM [id number]. Reason for Triage: Reason for Triage: Covid symptoms. Headache, congestion, cough, joint pain, back pain, aches, tested positive for covid. Wants to speak to a nurse regarding symptoms.  Callback [phone]   Reason for Disposition  [1] COVID-19 diagnosed by positive lab test (e.g., PCR, rapid self-test kit) AND [2] mild symptoms (e.g., cough, fever, others) AND [3] no complications or SOB  Answer Assessment - Initial Assessment Questions 1. COVID-19 DIAGNOSIS: "How do you know that you have COVID?" (e.g., positive lab test or self-test, diagnosed by doctor or NP/PA, symptoms after exposure).     Home test 2. COVID-19 EXPOSURE: "Was there any known exposure to COVID before the symptoms began?" CDC Definition of close contact: within 6 feet (2 meters) for a total of 15 minutes or more over a 24-hour period.      Trip last week 3. ONSET: "When did the COVID-19 symptoms start?"      Saturday night 4. WORST SYMPTOM: "What is your worst symptom?" (e.g., cough, fever, shortness of breath, muscle aches)    sore throat 5. COUGH: "Do you have a cough?" If Yes, ask: "How bad is the cough?"       Yes 3-4/10 6. FEVER: "Do you have a fever?" If Yes, ask: "What  is your temperature, how was it measured, and when did it start?"     Yes 99.9 7. RESPIRATORY STATUS: "Describe your breathing?" (e.g., normal; shortness of breath, wheezing, unable to speak)      normal 8. BETTER-SAME-WORSE: "Are you getting better, staying the same or getting worse compared to yesterday?"  If getting worse, ask, "In what way?"     Today worse with cough 9. OTHER SYMPTOMS: "Do you have any other symptoms?"  (e.g., chills, fatigue, headache, loss of smell or taste, muscle pain, sore throat)      Fatigue, cough, fever 99.9, body aches  10. HIGH RISK DISEASE: "Do you have any chronic medical problems?" (e.g., asthma, heart or lung disease, weak immune system, obesity, etc.)       no 11. VACCINE: "Have you had the COVID-19 vaccine?" If Yes, ask: "Which one, how many shots, when did you get it?"       3 vaccines  Protocols used: Coronavirus (COVID-19) Diagnosed or Suspected-A-AH

## 2023-12-09 NOTE — Telephone Encounter (Signed)
 Noted . Called pt to check in , if not better in a few day pt states she will make an MyChart video visit

## 2023-12-11 ENCOUNTER — Telehealth: Payer: Self-pay | Admitting: *Deleted

## 2023-12-11 NOTE — Telephone Encounter (Signed)
 Received call from pt stating she has not heard back from the financial team regarding an additional $160 that was added to her 09/19/23. RN apologized to pt for this inconvenience and sent a follow up email to billing team.

## 2023-12-25 ENCOUNTER — Ambulatory Visit
Admission: RE | Admit: 2023-12-25 | Discharge: 2023-12-25 | Disposition: A | Discharge status: Home / Self Care | Payer: PPO | Source: Ambulatory Visit | Attending: Family Medicine

## 2023-12-25 DIAGNOSIS — Z780 Asymptomatic menopausal state: Secondary | ICD-10-CM

## 2023-12-26 ENCOUNTER — Encounter: Payer: Self-pay | Admitting: Family Medicine

## 2023-12-26 ENCOUNTER — Ambulatory Visit: Payer: PPO | Admitting: Family Medicine

## 2023-12-26 DIAGNOSIS — Z0000 Encounter for general adult medical examination without abnormal findings: Secondary | ICD-10-CM | POA: Diagnosis not present

## 2023-12-26 NOTE — Progress Notes (Signed)
 PATIENT CHECK-IN and HEALTH RISK ASSESSMENT QUESTIONNAIRE:  -completed by phone/video for upcoming Medicare Preventive Visit  Pre-Visit Check-in: 1)Vitals (height, wt, BP, etc) - record in vitals section for visit on day of visit Request home vitals (wt, BP, etc.) and enter into vitals, THEN update Vital Signs SmartPhrase below at the top of the HPI. See below.  2)Review and Update Medications, Allergies PMH, Surgeries, Social history in Epic 3)Hospitalizations in the last year with date/reason? n 4)Review and Update Care Team (patient's specialists) in Epic 5) Complete PHQ9 in Epic  6) Complete Fall Screening in Epic 7)Review all Health Maintenance Due and order under PCP if not done.  Medicare Wellness Patient Questionnaire:  Answer theses question about your habits: How often do you have a drink containing alcohol?n Have you ever smoked? Quit in 2019 How many packs a day do/did you smoke? Smoked only a little and sporadically - reports less than 1 pack per week even at her heaviest use Do you use smokeless tobacco?n Do you use an illicit drugs?n On average, how many days per week do you engage in moderate to strenuous exercise (like a brisk walk)? Does not really exercise Typical diet: coffee, nutragrain bars or egg sandwich, trying to eat more fruit, for dinner usually a protein and few veggies  Beverages: coffee, water  Answer theses question about your everyday activities: Can you perform most household chores?y Are you deaf or have significant trouble hearing?n Do you feel that you have a problem with memory?mild, just had covid Do you feel safe at home?y Last dentist visit?goes for regular visits 8. Do you have any difficulty performing your everyday activities?n Are you having any difficulty walking, taking medications on your own, and or difficulty managing daily home needs?n Do you have difficulty walking or climbing stairs?n Do you have difficulty dressing or  bathing?n Do you have difficulty doing errands alone such as visiting a doctor's office or shopping?n Do you currently have any difficulty preparing food and eating?n Do you currently have any difficulty using the toilet?n Do you have any difficulty managing your finances?n Do you have any difficulties with housekeeping of managing your housekeeping?n   Do you have Advanced Directives in place (Living Will, Healthcare Power or Attorney)? yes   Last eye Exam and location?goes for regular visits, Dr. Danley Dusky   Do you currently use prescribed or non-prescribed narcotic or opioid pain medications?n     ----------------------------------------------------------------------------------------------------------------------------------------------------------------------------------------------------------------------  Because this visit was a virtual/telehealth visit, some criteria may be missing or patient reported. Any vitals not documented were not able to be obtained and vitals that have been documented are patient reported.    MEDICARE ANNUAL PREVENTIVE VISIT WITH PROVIDER: (Welcome to Medicare, initial annual wellness or annual wellness exam)  Virtual Visit via Video Note  I connected with Dawn Booker on 12/26/23 by  a video enabled telemedicine application and verified that I am speaking with the correct person using two identifiers.  Location patient: home Location provider:work or home office Persons participating in the virtual visit: patient, provider  Concerns and/or follow up today:   See HM section in Epic for other details of completed HM.    ROS: negative for report of fevers, unintentional weight loss, vision changes, vision loss, hearing loss or change, chest pain, sob, hemoptysis, melena, hematochezia, hematuria, falls, bleeding or bruising, thoughts of suicide or self harm, memory loss  Patient-completed extensive health risk assessment - reviewed and discussed  with the patient: See Health Risk Assessment completed  with patient prior to the visit either above or in recent phone note. This was reviewed in detailed with the patient today and appropriate recommendations, orders and referrals were placed as needed per Summary below and patient instructions.   Review of Medical History: -PMH, PSH, Family History and current specialty and care providers reviewed and updated and listed below   Patient Care Team: Francenia Ingle, NP as PCP - General (Family Medicine) Cameron Cea, MD as Consulting Physician (Hematology and Oncology) Caralyn Chandler, MD as Consulting Physician (General Surgery) Percival Brace, NP as Nurse Practitioner (Hematology and Oncology) Tami Falcon, MD as Consulting Physician (Gastroenterology) Ivery Marking, MD as Consulting Physician (Obstetrics and Gynecology)   Past Medical History:  Diagnosis Date   ADD (attention deficit disorder)    Anxiety    breast ca 2019   Left breast cancer   Depression    GERD (gastroesophageal reflux disease)    Hiatal hernia    History of radiation therapy 08/13/2018- 09/25/2018   Left Chest wall and IM nodes/ 50 Gy in 25 fractions, Left supraclaicular fossa and PAB/ 50 Gy in 25 fractions, Left chest wall scar boost/ 10 Gy in 5 fractions.    Hyperlipemia, mixed    Intermittent palpitations    Malignant neoplasm of overlapping sites of left breast in female, estrogen receptor positive (HCC) 05/06/2018   oncologist-  dr Lee Public--  Invasive Lobular Cancer, CIS, Stage IB, Grade 2 (pT3,pN1a,cM0), ER+---  s/p  left mastectomy with sln dissections and right mastectomy (benign)   Osteoporosis    PONV (postoperative nausea and vomiting)    Right thyroid  nodule    Shingles 12/2017   Tennis elbow     Past Surgical History:  Procedure Laterality Date   AXILLARY LYMPH NODE DISSECTION Left 07/09/2018   Procedure: COMPLETION OF LEFT AXILLARY LYMPH NODE DISSECTION;  Surgeon: Lillette Reid III, MD;  Location: [facility];  Service: General;  Laterality: Left;   BREAST EXCISIONAL BIOPSY Left 1995   benign   CARDIOVASCULAR STRESS TEST  06/21/2017   normal nuclear stress study w/ no ischemia/  normal LV function and wall function, nuclear stress ef 70%   ELBOW SURGERY Left child   MASTECTOMY W/ SENTINEL NODE BIOPSY Left 06/13/2018   MASTECTOMY WITH RADIOACTIVE SEED GUIDED EXCISION AND AXILLARY SENTINEL LYMPH NODE BIOPSY Bilateral 06/13/2018   Procedure: LEFT MASTECTOMY WITH SENTINEL LYMPH NODE MAPPING AND TARGETED NODE DISSECTION AND RIGHT PROPHYLATIC MASTECTOMY;  Surgeon: Caralyn Chandler, MD;  Location: [facility];  Service: General;  Laterality: Bilateral;   THYROIDECTOMY Left 10-31-2001   dr gerkin @[facility]    TRANSTHORACIC ECHOCARDIOGRAM  06/21/2017   ef 60-65%/  trivial MR (no evidence mvp)   TUBAL LIGATION  yrs ago   VAGINAL HYSTERECTOMY  1990s    Social History   Socioeconomic History   Marital status: Married    Spouse name: Not on file   Number of children: 2   Years of education: 12   Highest education level: 12th grade  Occupational History   Not on file  Tobacco Use   Smoking status: Former    Current packs/day: 0.50    Average packs/day: 0.5 packs/day for 51.3 years (25.7 ttl pk-yrs)    Types: Cigarettes    Start date: 1974   Smokeless tobacco: Never  Vaping Use   Vaping status: Never Used  Substance and Sexual Activity   Alcohol use: Not Currently   Drug use: No   Sexual activity: Yes  Partners: Male    Birth control/protection: None  Other Topics Concern   Not on file  Social History Narrative   Lives at home with husband.   Social Drivers of Corporate investment banker Strain: Low Risk  (12/26/2023)   Overall Financial Resource Strain (CARDIA)    Difficulty of Paying Living Expenses: Not hard at all  Food Insecurity: No Food Insecurity (12/26/2023)   Hunger Vital Sign    Worried About Running Out of Food in the Last Year: Never true    Ran Out  of Food in the Last Year: Never true  Transportation Needs: No Transportation Needs (12/26/2023)   PRAPARE - Administrator, Civil Service (Medical): No    Lack of Transportation (Non-Medical): No  Physical Activity: Inactive (12/26/2023)   Exercise Vital Sign    Days of Exercise per Week: 0 days    Minutes of Exercise per Session: 0 min  Stress: No Stress Concern Present (12/26/2023)   [employer] of Occupational Health - Occupational Stress Questionnaire    Feeling of Stress : Only a little  Social Connections: Unknown (12/26/2023)   Social Connection and Isolation Panel [NHANES]    Frequency of Communication with Friends and Family: More than three times a week    Frequency of Social Gatherings with Friends and Family: Once a week    Attends Religious Services: Patient declined    Database administrator or Organizations: Patient declined    Attends Engineer, structural: More than 4 times per year    Marital Status: Married  Catering manager Violence: Not At Risk (01/24/2023)   Humiliation, Afraid, Rape, and Kick questionnaire    Fear of Current or Ex-Partner: No    Emotionally Abused: No    Physically Abused: No    Sexually Abused: No    Family History  Problem Relation Age of Onset   Hypertension Mother    Heart attack Mother    Breast cancer Mother    Bipolar disorder Father        Sucide   Other Sister        Passed at birth   Breast cancer Maternal Grandmother    Breast cancer Cousin    Emphysema Half-Sister    Alcohol abuse Half-Brother    Stroke Half-Brother     Current Outpatient Medications on File Prior to Visit  Medication Sig Dispense Refill   acetaminophen  (TYLENOL ) 500 MG tablet Take 1,000 mg by mouth at bedtime.     atorvastatin  (LIPITOR) 10 MG tablet Take 1 tablet by mouth once daily 100 tablet 0   buPROPion  (WELLBUTRIN  XL) 150 MG 24 hr tablet Take 1 tablet (150 mg total) by mouth daily. 100 tablet 1   cetirizine  (ZYRTEC  ALLERGY)  10 MG tablet Take 1 tablet (10 mg total) by mouth daily. 100 tablet 0   Cholecalciferol (VITAMIN D ) 50 MCG (2000 UT) CAPS Take by mouth. 1 capsule daily     Coenzyme Q10 (COQ10) 100 MG CAPS Take 1 capsule by mouth daily.     Cyanocobalamin  (VITAMIN B12) 500 MCG TABS Take by mouth. 1 tablet by mouth daily     denosumab  (PROLIA ) 60 MG/ML SOSY injection Inject 60 mg into the skin every 6 (six) months. 1 mL 4   folic acid  (FOLVITE ) 400 MCG tablet Take 400 mcg by mouth daily.     omeprazole  (PRILOSEC) 40 MG capsule Take 1 capsule (40 mg total) by mouth daily. 100 capsule 2  OVER THE COUNTER MEDICATION 1 tablet daily. Vegan Calcium  1500     temazepam  (RESTORIL ) 30 MG capsule TAKE 1 CAPSULE BY MOUTH ONCE DAILY AT NIGHT AT BEDTIME 100 capsule 0   No current facility-administered medications on file prior to visit.    Allergies  Allergen Reactions   Lamictal [Lamotrigine] Other (See Comments)    Rogue Clear Syndrome   Cymbalta  [Duloxetine  Hcl]     Rogue Clear syndrome   Hydrocodone  Nausea Only   Mobic [Meloxicam]     Same ingredients as lamictal   Sulfa Antibiotics Hives   Corticosteroids Rash   Morphine Palpitations and Other (See Comments)   Other     "MYCINS" - severe abdominal cramping    Prednisone Rash        Prozac  [Fluoxetine  Hcl] Palpitations   Vortioxetine Anxiety and Other (See Comments)    Increased anxiety   Zoloft [Sertraline] Anxiety       Physical Exam Vitals requested from patient and listed below if patient had equipment and was able to obtain at home for this virtual visit: There were no vitals filed for this visit. Estimated body mass index is 26.39 kg/m as calculated from the following:   Height as of 09/19/23: 5\' 5"  (1.651 m).   Weight as of 09/19/23: 158 lb 9.6 oz (71.9 kg).  EKG (optional): deferred due to virtual visit  GENERAL: alert, oriented, no acute distress detected, full vision exam deferred due to pandemic and/or virtual encounter  HEENT:  atraumatic, conjunttiva clear, no obvious abnormalities on inspection of external nose and ears  NECK: normal movements of the head and neck  LUNGS: on inspection no signs of respiratory distress, breathing rate appears normal, no obvious gross SOB, gasping or wheezing  CV: no obvious cyanosis  MS: moves all visible extremities without noticeable abnormality  PSYCH/NEURO: pleasant and cooperative, no obvious depression or anxiety, speech and thought processing grossly intact, Cognitive function grossly intact  Flowsheet Row Office Visit from [facility]/13/2023 in [facility] [place]  PHQ-9 Total Score 5           12/26/2023    5:33 PM 08/13/2023   10:41 AM 05/13/2023    1:43 PM 03/22/2023   10:10 AM 09/25/2022    1:39 PM  Depression screen PHQ 2/9  Decreased Interest 0 1 1 0 0  Down, Depressed, Hopeless 1 1 1  0 0  PHQ - 2 Score 1 2 2  0 0  Altered sleeping  1 1 1 1   Tired, decreased energy  1 1 0 1  Change in appetite  1 0 0 0  Feeling bad or failure about yourself   0 0 0 0  Trouble concentrating  0 0 0 1  Moving slowly or fidgety/restless  0 0 0 0  Suicidal thoughts  0 0 0 0  PHQ-9 Score  5 4 1 3   Difficult doing work/chores  Somewhat difficult Not difficult at all Not difficult at all        05/13/2023    1:43 PM 05/28/2023    2:33 PM 08/12/2023   11:31 AM 08/13/2023   10:41 AM 12/26/2023    5:33 PM  Fall Risk  Falls in the past year? 0 0 0 0 0  Was there an injury with Fall? 0 0  0 0  Fall Risk Category Calculator 0 0  0 0  Patient at Risk for Falls Due to No Fall Risks No Fall Risks     Fall  risk Follow up Falls evaluation completed Falls evaluation completed  Falls evaluation completed      SUMMARY AND PLAN:  Encounter for Medicare annual wellness exam   Discussed applicable health maintenance/preventive health measures and advised and referred or ordered per patient preferences: -she declined lung cancer screening and given her smoking hx it  does not seem that she would qualify as she reports sporadic light smoking of at most 2-3 cigs per day.  -discussed covid vaccine recs/risks, advised can get at the pharmacy if she decides to get Health Maintenance  Topic Date Due   COVID-19 Vaccine (4 - 2024-25 season) 05/05/2023   Lung Cancer Screening  11/14/2023   INFLUENZA VACCINE  04/03/2024   DTaP/Tdap/Td (2 - Td or Tdap) 06/14/2024   Medicare Annual Wellness (AWV)  12/25/2024   Colonoscopy  07/23/2029   Pneumonia Vaccine 73+ Years old  Completed   DEXA SCAN  Completed   Hepatitis C Screening  Completed   Zoster Vaccines- Shingrix   Completed   HPV VACCINES  Aged Out   Meningococcal B Vaccine  Aged Out   MAMMOGRAM  Discontinued      Education and counseling on the following was provided based on the above review of health and a plan/checklist for the patient, along with additional information discussed, was provided for the patient in the patient instructions :   -Advised and counseled on a healthy lifestyle - including the importance of a healthy diet, regular physical activity, social connections  -feels the depression is from recent covid infection and reports is not bad and actually improved from chronic depression in the past, reports she does not feel needs counseling or tx.  -Reviewed patient's current diet. Advised and counseled on a whole foods based healthy diet. A summary of a healthy diet was provided in the Patient Instructions.  -reviewed patient's current physical activity level and discussed exercise guidelines for adults. Discussed community resources and ideas for safe exercise at home to assist in meeting exercise guideline recommendations in a safe and healthy way.  -Advise yearly dental visits at minimum and regular eye exams   Follow up: see patient instructions     Patient Instructions  I really enjoyed getting to talk with you today! I am available on Tuesdays and Thursdays for virtual visits if you  have any questions or concerns, or if I can be of any further assistance.   CHECKLIST FROM ANNUAL WELLNESS VISIT:  -Follow up (please call to schedule if not scheduled after visit):   -yearly for annual wellness visit with primary care office  Here is a list of your preventive care/health maintenance measures and the plan for each if any are due:  PLAN For any measures below that may be due:   Health Maintenance  Topic Date Due   COVID-19 Vaccine (4 - 2024-25 season) 05/05/2023   Lung Cancer Screening  11/14/2023   INFLUENZA VACCINE  04/03/2024   DTaP/Tdap/Td (2 - Td or Tdap) 06/14/2024   Medicare Annual Wellness (AWV)  12/25/2024   Colonoscopy  07/23/2029   Pneumonia Vaccine 14+ Years old  Completed   DEXA SCAN  Completed   Hepatitis C Screening  Completed   Zoster Vaccines- Shingrix   Completed   HPV VACCINES  Aged Out   Meningococcal B Vaccine  Aged Out   MAMMOGRAM  Discontinued    -See a dentist at least yearly  -Get your eyes checked and then per your eye specialist's recommendations  -Other issues addressed today:   -  I have included below further information regarding a healthy whole foods based diet, physical activity guidelines for adults, stress management and opportunities for social connections. I hope you find this information useful.   -----------------------------------------------------------------------------------------------------------------------------------------------------------------------------------------------------------------------------------------------------------    NUTRITION: -eat real food: lots of colorful vegetables (half the plate) and fruits -5-7 servings of vegetables and fruits per day (fresh or steamed is best), exp. 2 servings of vegetables with lunch and dinner and 2 servings of fruit per day. Berries and greens such as kale and collards are great choices.  -consume on a regular basis:  fresh fruits, fresh veggies, fish, nuts,  seeds, healthy oils (such as olive oil, avocado oil), whole grains (make sure for bread/pasta/crackers/etc., that the first ingredient on label contains the word "whole"), legumes. -can eat small amounts of dairy and lean meat (no larger than the palm of your hand), but avoid processed meats such as ham, bacon, lunch meat, etc. -drink water -try to avoid fast food and pre-packaged foods, processed meat, ultra processed foods/beverages (donuts, candy, etc.) -most experts advise limiting sodium to < 2300mg  per day, should limit further is any chronic conditions such as high blood pressure, heart disease, diabetes, etc. The American Heart Association advised that < 1500mg  is is ideal -try to avoid foods/beverages that contain any ingredients with names you do not recognize  -try to avoid foods/beverages  with added sugar or sweeteners/sweets  -try to avoid sweet drinks (including diet drinks): soda, juice, Gatorade, sweet tea, power drinks, diet drinks -try to avoid white rice, white bread, pasta (unless whole grain)  EXERCISE GUIDELINES FOR ADULTS: -if you wish to increase your physical activity, do so gradually and with the approval of your doctor -STOP and seek medical care immediately if you have any chest pain, chest discomfort or trouble breathing when starting or increasing exercise  -move and stretch your body, legs, feet and arms when sitting for long periods -Physical activity guidelines for optimal health in adults: -get at least 150 minutes per week of moderate exercise (can talk, but not sing); this is about 20-30 minutes of sustained activity 5-7 days per week or two 10-15 minute episodes of sustained activity 5-7 days per week -do some muscle building/resistance training/strength training at least 2 days per week  -balance exercises 3+ days per week:   Stand somewhere where you have something sturdy to hold onto if you lose balance    1) lift up on toes, then back down, start with 5x  per day and work up to 20x   2) stand and lift one leg straight out to the side so that foot is a few inches of the floor, start with 5x each side and work up to 20x each side   3) stand on one foot, start with 5 seconds each side and work up to 20 seconds on each side  If you need ideas or help with getting more active:  -Silver sneakers https://tools.silversneakers.com  -Walk with a Doc: http://www.duncan-williams.com/  -try to include resistance (weight lifting/strength building) and balance exercises twice per week: or the following link for ideas: http://castillo-powell.com/  BuyDucts.dk  STRESS MANAGEMENT: -can try meditating, or just sitting quietly with deep breathing while intentionally relaxing all parts of your body for 5 minutes daily -if you need further help with stress, anxiety or depression please follow up with your primary doctor or contact the wonderful folks at [facility]: [phone]  SOCIAL CONNECTIONS: -options in [place] if you wish to engage in more social and  exercise related activities:  -Silver sneakers https://tools.silversneakers.com  -Walk with a Doc: http://www.duncan-williams.com/  -Check out the [facility] Active Adults 50+ section on the [place] of [employer] (hiking clubs, book clubs, cards and games, chess, exercise classes, aquatic classes and much more) - see the website for details: https://www.[place].gov/departments/parks-recreation/active-adults50  -YouTube has lots of exercise videos for different ages and abilities as well  -[facility] (a variety of indoor and outdoor inperson activities for adults). 562-818-8111. [address]  -Virtual Online Classes (a variety of topics): see seniorplanet.org or call [phone]  -consider volunteering at a school, hospice center, church, senior center or  elsewhere            Maurie Southern, DO

## 2023-12-26 NOTE — Patient Instructions (Signed)
 I really enjoyed getting to talk with you today! I am available on Tuesdays and Thursdays for virtual visits if you have any questions or concerns, or if I can be of any further assistance.   CHECKLIST FROM ANNUAL WELLNESS VISIT:  -Follow up (please call to schedule if not scheduled after visit):   -yearly for annual wellness visit with primary care office  Here is a list of your preventive care/health maintenance measures and the plan for each if any are due:  PLAN For any measures below that may be due:   Health Maintenance  Topic Date Due   COVID-19 Vaccine (4 - 2024-25 season) 05/05/2023   Lung Cancer Screening  11/14/2023   INFLUENZA VACCINE  04/03/2024   DTaP/Tdap/Td (2 - Td or Tdap) 06/14/2024   Medicare Annual Wellness (AWV)  12/25/2024   Colonoscopy  07/23/2029   Pneumonia Vaccine 52+ Years old  Completed   DEXA SCAN  Completed   Hepatitis C Screening  Completed   Zoster Vaccines- Shingrix   Completed   HPV VACCINES  Aged Out   Meningococcal B Vaccine  Aged Out   MAMMOGRAM  Discontinued    -See a dentist at least yearly  -Get your eyes checked and then per your eye specialist's recommendations  -Other issues addressed today:   -I have included below further information regarding a healthy whole foods based diet, physical activity guidelines for adults, stress management and opportunities for social connections. I hope you find this information useful.   -----------------------------------------------------------------------------------------------------------------------------------------------------------------------------------------------------------------------------------------------------------    NUTRITION: -eat real food: lots of colorful vegetables (half the plate) and fruits -5-7 servings of vegetables and fruits per day (fresh or steamed is best), exp. 2 servings of vegetables with lunch and dinner and 2 servings of fruit per day. Berries and greens such  as kale and collards are great choices.  -consume on a regular basis:  fresh fruits, fresh veggies, fish, nuts, seeds, healthy oils (such as olive oil, avocado oil), whole grains (make sure for bread/pasta/crackers/etc., that the first ingredient on label contains the word "whole"), legumes. -can eat small amounts of dairy and lean meat (no larger than the palm of your hand), but avoid processed meats such as ham, bacon, lunch meat, etc. -drink water -try to avoid fast food and pre-packaged foods, processed meat, ultra processed foods/beverages (donuts, candy, etc.) -most experts advise limiting sodium to < 2300mg  per day, should limit further is any chronic conditions such as high blood pressure, heart disease, diabetes, etc. The American Heart Association advised that < 1500mg  is is ideal -try to avoid foods/beverages that contain any ingredients with names you do not recognize  -try to avoid foods/beverages  with added sugar or sweeteners/sweets  -try to avoid sweet drinks (including diet drinks): soda, juice, Gatorade, sweet tea, power drinks, diet drinks -try to avoid white rice, white bread, pasta (unless whole grain)  EXERCISE GUIDELINES FOR ADULTS: -if you wish to increase your physical activity, do so gradually and with the approval of your doctor -STOP and seek medical care immediately if you have any chest pain, chest discomfort or trouble breathing when starting or increasing exercise  -move and stretch your body, legs, feet and arms when sitting for long periods -Physical activity guidelines for optimal health in adults: -get at least 150 minutes per week of moderate exercise (can talk, but not sing); this is about 20-30 minutes of sustained activity 5-7 days per week or two 10-15 minute episodes of sustained activity 5-7 days per week -do  some muscle building/resistance training/strength training at least 2 days per week  -balance exercises 3+ days per week:   Stand somewhere where  you have something sturdy to hold onto if you lose balance    1) lift up on toes, then back down, start with 5x per day and work up to 20x   2) stand and lift one leg straight out to the side so that foot is a few inches of the floor, start with 5x each side and work up to 20x each side   3) stand on one foot, start with 5 seconds each side and work up to 20 seconds on each side  If you need ideas or help with getting more active:  -Silver sneakers https://tools.silversneakers.com  -Walk with a Doc: http://www.duncan-williams.com/  -try to include resistance (weight lifting/strength building) and balance exercises twice per week: or the following link for ideas: http://castillo-powell.com/  BuyDucts.dk  STRESS MANAGEMENT: -can try meditating, or just sitting quietly with deep breathing while intentionally relaxing all parts of your body for 5 minutes daily -if you need further help with stress, anxiety or depression please follow up with your primary doctor or contact the wonderful folks at [facility]: [phone]  SOCIAL CONNECTIONS: -options in [place] if you wish to engage in more social and exercise related activities:  -Silver sneakers https://tools.silversneakers.com  -Walk with a Doc: http://www.duncan-williams.com/  -Check out the [facility] Active Adults 50+ section on the [place] [employer] (hiking clubs, book clubs, cards and games, chess, exercise classes, aquatic classes and much more) - see the website for details: https://www.[place].gov/departments/parks-recreation/active-adults50  -YouTube has lots of exercise videos for different ages and abilities as well  -[facility] (a variety of indoor and outdoor inperson activities for adults). (561) [phone]. [address]  -Virtual Online Classes (a variety of topics): see seniorplanet.org or  call ([phone])021-1854  -consider volunteering at a school, hospice center, church, senior center or elsewhere

## 2023-12-29 ENCOUNTER — Other Ambulatory Visit: Payer: Self-pay | Admitting: Family Medicine

## 2023-12-29 DIAGNOSIS — J302 Other seasonal allergic rhinitis: Secondary | ICD-10-CM

## 2024-01-10 ENCOUNTER — Other Ambulatory Visit: Payer: Self-pay | Admitting: Family Medicine

## 2024-01-10 DIAGNOSIS — E7800 Pure hypercholesterolemia, unspecified: Secondary | ICD-10-CM

## 2024-01-28 ENCOUNTER — Encounter: Payer: Self-pay | Admitting: Family Medicine

## 2024-01-28 ENCOUNTER — Ambulatory Visit (INDEPENDENT_AMBULATORY_CARE_PROVIDER_SITE_OTHER): Admitting: Family Medicine

## 2024-01-28 VITALS — BP 120/80 | HR 80 | Temp 98.0°F | Ht 65.0 in | Wt 158.0 lb

## 2024-01-28 DIAGNOSIS — T17320A Food in larynx causing asphyxiation, initial encounter: Secondary | ICD-10-CM | POA: Diagnosis not present

## 2024-01-28 DIAGNOSIS — R490 Dysphonia: Secondary | ICD-10-CM | POA: Diagnosis not present

## 2024-01-28 DIAGNOSIS — R454 Irritability and anger: Secondary | ICD-10-CM | POA: Diagnosis not present

## 2024-01-28 DIAGNOSIS — W44F3XA Food entering into or through a natural orifice, initial encounter: Secondary | ICD-10-CM

## 2024-01-28 DIAGNOSIS — T17308S Unspecified foreign body in larynx causing other injury, sequela: Secondary | ICD-10-CM

## 2024-01-28 DIAGNOSIS — F339 Major depressive disorder, recurrent, unspecified: Secondary | ICD-10-CM

## 2024-01-28 LAB — TSH: TSH: 2.13 u[IU]/mL (ref 0.35–5.50)

## 2024-01-28 LAB — VITAMIN B12: Vitamin B-12: 527 pg/mL (ref 211–911)

## 2024-01-28 MED ORDER — BUPROPION HCL ER (XL) 300 MG PO TB24: 300.0000 mg | ORAL_TABLET | Freq: Every day | ORAL | 0 refills | Status: DC

## 2024-01-28 NOTE — Patient Instructions (Signed)
 It was a pleasure seeing you today!  - I Increased Wellbutrin  from 150mg  to 300 mg daily for depression.  -Please stop medication and go to the nearest ER if suicidal or homocidal thoughts start to occur. - Labs ordered for increased irritability- Thyroid  labs and vitamin B12 - Continue taking omeprazole  for GERD. - ENT referral will be sent after considering what provider you would like to choose. -Follow-up with me after 1 month

## 2024-01-28 NOTE — Progress Notes (Signed)
 Acute Office Visit  Subjective:     Patient ID: Dawn Booker, female    DOB: [birth year], 68 y.o.   MRN: [id number]  Chief Complaint  Patient presents with   Medical Management of Chronic Issues    Depression.    HPI Dawn Booker 68 year old female presents today for a follow-up on depression. Patient is currently taking prescribed wellbutrin  150mg . Patient states that she has spells every so often where she doesn't like herself and is angry. Patient states that she is having mood swings and has become short tempered. She has noticed her depression gradually getting worse in the last year. Patient has taken 300 mg Wellbutrin  which has helped in the past. Patient is interested in having her thyroid  and vitamin B12 labs drawn at today's visit due to her increased irritability.     Patient has complaints of hoarseness, shakiness and weakness in her throat that started after she started taking atorvastatin . Patient states that she has started getting choked up on food and water when drinking and eating.    Review of Systems  Respiratory:  Negative for shortness of breath.   Cardiovascular:  Positive for palpitations (occ). Negative for chest pain.  Gastrointestinal:  Positive for diarrhea (occ). Negative for heartburn, nausea and vomiting.  Neurological:  Negative for weakness.  Psychiatric/Behavioral:  Positive for depression. Negative for suicidal ideas. The patient is nervous/anxious and has insomnia.         Objective:    BP 120/80   Pulse 80   Temp 98 F (36.7 C) (Oral)   Ht 5\' 5"  (1.651 m)   Wt 158 lb (71.7 kg)   SpO2 97%   BMI 26.29 kg/m    Physical Exam Constitutional:      Appearance: Normal appearance.  HENT:     Head: Normocephalic and atraumatic.     Mouth/Throat:     Mouth: Mucous membranes are moist.     Pharynx: No oropharyngeal exudate or posterior oropharyngeal erythema.  Eyes:     Extraocular Movements: Extraocular movements intact.  Neck:      Comments: Thyroid  wnl no nodules present  Cardiovascular:     Rate and Rhythm: Normal rate and regular rhythm.     Heart sounds: Normal heart sounds.  Pulmonary:     Effort: Pulmonary effort is normal.     Breath sounds: Normal breath sounds.  Musculoskeletal:     Cervical back: Normal range of motion and neck supple.  Skin:    General: Skin is warm.  Neurological:     Mental Status: She is alert and oriented to person, place, and time.  Psychiatric:        Mood and Affect: Mood normal.        Behavior: Behavior normal.     No results found for any visits on 01/28/24.      Assessment & Plan:   Problem List Items Addressed This Visit   None Visit Diagnoses       Depression, recurrent (HCC)    -  Primary   Relevant Medications   buPROPion  (WELLBUTRIN  XL) 300 MG 24 hr tablet     Irritability       Relevant Orders   TSH   Vitamin B12     Choking due to food in larynx, initial encounter         Hoarse voice quality           Meds ordered this encounter  Medications   buPROPion  (  WELLBUTRIN  XL) 300 MG 24 hr tablet    Sig: Take 1 tablet (300 mg total) by mouth daily.    Dispense:  30 tablet    Refill:  0   Depression -Increased Wellbutrin  from 150mg  to 300 mg daily.  -Patient educated to stop medication and go to the nearest ER if suicidal or homocidal thoughts start to occur.  Irritability - Labs ordered- TSH and vitamin B12  Choking - Continue taking omeprazole  for GERD. - ENT referral will be sent after patient messages through MyChart on which provider she prefers.  Return in about 1 month (around 02/28/2024) for chronic management.  Janeann Mean, RN

## 2024-01-29 ENCOUNTER — Ambulatory Visit: Payer: Self-pay | Admitting: Family Medicine

## 2024-02-03 ENCOUNTER — Encounter: Payer: Self-pay | Admitting: Family Medicine

## 2024-02-05 NOTE — Progress Notes (Signed)
 Otolaryngology Clinic Note  HPI:    Dawn Booker is a 68 y.o. female who presents as a new patient.  Dawn Booker presents today with complaint of hoarseness.  She states she has noticed this for approximately 9 months to 1 year.  She associates it with her PCP changing her cholesterol medication to atorvastatin .  She also has been told she has reflux previously and she does take omeprazole  daily.  She is not adhering to any diet and lifestyle changes.  History of some smoking in the past.  PMH/Meds/All/SocHx/FamHx/ROS:   Past Medical History:  Diagnosis Date  . Hearing loss     Past Surgical History:  Procedure Laterality Date  . HYSTERECTOMY      Procedure: HYSTERECTOMY  . THYROID  SURGERY     Procedure: THYROID  SURGERY  . TONSILLECTOMY     Procedure: TONSILLECTOMY  . TUBAL LIGATION     Procedure: TUBAL LIGATION    No family history of bleeding disorders, wound healing problems or difficulty with anesthesia.       Current Outpatient Medications:  .  atorvastatin  (LIPITOR) 10 mg tablet, Take 10 mg by mouth daily., Disp: , Rfl:  .  buPROPion  (WELLBUTRIN  XL) 300 mg 24 hr tablet, Take 300 mg by mouth Once Daily., Disp: , Rfl:  .  cetirizine  (ZyrTEC ) 10 mg tablet, Take 10 mg by mouth daily., Disp: , Rfl:  .  cholecalciferol (VITAMIN D3) 2,000 unit cap capsule, Take by mouth., Disp: , Rfl:  .  coenzyme Q10-vitamin E 100-5 mg-unit cap, Take 1 capsule by mouth daily., Disp: , Rfl:  .  cyanocobalamin  (VITAMIN B12) 500 mcg tablet, Take by mouth., Disp: , Rfl:  .  denosumab  (Prolia ) 60 mg/mL syrg syringe, , Disp: , Rfl:  .  folic acid  (FOLVITE ) 400 mcg tablet, Take 400 mcg by mouth daily., Disp: , Rfl:  .  ibuprofen  (MOTRIN ) 200 mg tablet, Take  by mouth as needed., Disp: , Rfl:  .  magnesium oxide 400 mg (241 mg magnesium) tab, Take 400 mg by mouth daily., Disp: , Rfl:  .  omeprazole  (PriLOSEC) 40 mg DR capsule, , Disp: , Rfl:  .  temazepam  (RESTORIL ) 30 mg capsule, , Disp: , Rfl:  0  A complete ROS was performed with pertinent positives/negatives noted in the HPI. The remainder of the ROS are negative.    Physical Exam:    BP (!) 152/93   Pulse 85   Temp 96.7 F (35.9 C) (Temporal)   Ht 1.638 m (5' 4.5)   Wt 70.8 kg (156 lb)   BMI 26.36 kg/m   Constitutional:  Patient appears well-nourished and well-developed. No acute distress.   Head/Face: Facial features are symmetric. Skull is normocephalic. Hair and scalp are normal. Normal temporal artery pulses. TMJ shows no joint deformity swelling or erythema.   Eyes: Pupils are equal, round and reactive to light. Conjunctiva and lids are normal. Normal extraocular mobility. Normal vision by patient report.   Ears:     Right: Pinna and external meatus normal, normal ear canal skin and caliber without excessive cerumen or drainage. Tympanic membranes intact without effusion or infection. Hearing normal.    Left: Pinna and external meatus normal, normal ear canal skin and caliber without excessive cerumen or drainage. Tympanic membranes intact without effusion or infection. Hearing normal.   Nose/Sinus/Nasopharynx: Septum is deviated Normal nasal mucosa. Normal inferior turbinates.    Oral cavity/Oropharynx: Lips normal, teeth and gums normal with good dentition, normal oral vestibule. Normal floor  of mouth, tongue and oral mucosa, no mucosal lesions, ulcer or mass, normal tongue mobility.  Hard and soft palate normal with normal mobility. Tonsils surgically absent, no erythema or exudate. Base of tongue, retromolar trigone and oral pharynx normal. Normal sensation, mobility and gag.   Neck: No cervical lymphadenopathy, mass or swelling. Salivary glands normal to palpation without swelling, erythema or mass. Normal facial nerve function. Well-healed thyroid  surgery scar   Neurological: Alert and oriented to self, place and time.  Normal reflexes and motor skills, balance and coordination.    Psychiatric: No unusual anxiety or evidence of depression. Appropriate affect.     Independent Review of Additional Tests or Records:  None  Procedures:  Flexible Laryngoscopy Procedure Note  Indications: Hoarseness  Risks, benefits and clinical relevance of the study were discussed. The patient understands and agrees to proceed.   Procedure: After adequate topical anesthetic was applied, 2 mm flexible laryngoscope was passed through the nasal cavity without difficulty. Flexible laryngoscopy shows patent anterior nasal cavity with minimal crusting, no discharge or infection.  Nasal septal deviation and spurring present Normal base of tongue and supraglottis Normal vocal cord mobility without vocal cord nodule, mass, polyp or tumor.  Slight bowing and thinning of true vocal folds.  Hypopharynx normal without mass, pooling of secretions or aspiration.  Patient tolerated procedure without complication or difficulty.   Alm RAMAN. Spainhour, PA-C   Impression & Plans:   1) hoarseness 2) laryngopharyngeal reflux 3) nasal septal deviation   The literature certainly suggests hoarseness is a potential side effect of atorvastatin .  She will need to discuss this further with her PCP.  She was encouraged to continue her omeprazole  daily and start instituting some dietary and lifestyle measures.  Further evaluation [facility] lab at [facility] offered she does not wish to pursue that option at this time. Return to clinic as needed  Alm RAMAN. Spainhour, PA-C [facility]

## 2024-02-10 ENCOUNTER — Encounter: Payer: Self-pay | Admitting: Hematology and Oncology

## 2024-02-23 ENCOUNTER — Other Ambulatory Visit: Payer: Self-pay | Admitting: Family Medicine

## 2024-02-23 DIAGNOSIS — F5105 Insomnia due to other mental disorder: Secondary | ICD-10-CM

## 2024-02-23 DIAGNOSIS — F411 Generalized anxiety disorder: Secondary | ICD-10-CM

## 2024-02-23 DIAGNOSIS — F331 Major depressive disorder, recurrent, moderate: Secondary | ICD-10-CM

## 2024-02-28 ENCOUNTER — Ambulatory Visit: Admitting: Family Medicine

## 2024-02-28 ENCOUNTER — Ambulatory Visit (INDEPENDENT_AMBULATORY_CARE_PROVIDER_SITE_OTHER): Admitting: Family Medicine

## 2024-02-28 ENCOUNTER — Encounter: Payer: Self-pay | Admitting: Family Medicine

## 2024-02-28 DIAGNOSIS — F339 Major depressive disorder, recurrent, unspecified: Secondary | ICD-10-CM

## 2024-02-28 MED ORDER — BUPROPION HCL ER (XL) 300 MG PO TB24: 300.0000 mg | ORAL_TABLET | Freq: Every day | ORAL | 1 refills | Status: DC

## 2024-02-28 NOTE — Patient Instructions (Addendum)
-  Wellbutrin  300mg  tablet daily has improved depression and being irritable. Refilled medication. -For the next month, try recording occurances that are concerning with memory and confusion. We will discuss further at next visit. May need a referral to neurology. -Follow up in 1 month for chronic management.  -Take care and enjoy the pool!

## 2024-02-28 NOTE — Progress Notes (Signed)
   Established Patient Office Visit   Subjective:  Patient ID: Dawn Booker, female    DOB: 12/09/1955  Age: 68 y.o. MRN: [id number]  Chief Complaint  Patient presents with   Medical Management of Chronic Issues    1 month follow up    HPI Depression: Patient is following up for a 1 month appointment. On last visit, Wellbutrin  was increased to 300mg  daily from 150mg  daily. She reports she is doing better with being on the medication, less irritable.   Patient is concerned about her memory and confusion. She reports on 06/25, she thought it was her daughters birthday, when actually her birthday is [birth year]. She reports she had forgot it, but the family celebrated the birthday last month. She reports this is not the first occurrence that her memory is becoming concerning. Also, reports she remembering parts of stories and combining them.    ROS See HPI above     Objective:   BP 130/84   Pulse 73   Temp 98.1 F (36.7 C) (Oral)   Ht 5' 5 (1.651 m)   Wt 160 lb (72.6 kg)   SpO2 98%   BMI 26.63 kg/m    Physical Exam Vitals reviewed.  Constitutional:      General: She is not in acute distress.    Appearance: Normal appearance. She is not ill-appearing, toxic-appearing or diaphoretic.  HENT:     Head: Normocephalic and atraumatic.   Eyes:     General:        Right eye: No discharge.        Left eye: No discharge.     Conjunctiva/sclera: Conjunctivae normal.    Cardiovascular:     Rate and Rhythm: Normal rate.  Pulmonary:     Effort: Pulmonary effort is normal. No respiratory distress.   Musculoskeletal:        General: Normal range of motion.   Skin:    General: Skin is warm and dry.   Neurological:     General: No focal deficit present.     Mental Status: She is alert and oriented to person, place, and time. Mental status is at baseline.   Psychiatric:        Mood and Affect: Mood normal.        Behavior: Behavior normal.        Thought Content: Thought  content normal.        Judgment: Judgment normal.      Assessment & Plan:  Depression, recurrent (HCC) -     buPROPion  HCl ER (XL); Take 1 tablet (300 mg total) by mouth daily.  Dispense: 100 tablet; Refill: 1  -Wellbutrin  300mg  tablet daily has improved depression and being irritable. Refilled medication. -For the next month, advised to try recording occurances that are concerning with memory and confusion. We will discuss further at next visit. Will need a memory test completed. May need a referral to neurology. Vitamin B12 was normal at last visit.  -Follow up in 1 month for chronic management.   Return in about 1 month (around 03/29/2024) for chronic management.   Artelia Game, NP

## 2024-04-01 ENCOUNTER — Other Ambulatory Visit: Payer: Self-pay | Admitting: Family Medicine

## 2024-04-01 DIAGNOSIS — K219 Gastro-esophageal reflux disease without esophagitis: Secondary | ICD-10-CM

## 2024-04-02 ENCOUNTER — Telehealth: Payer: Self-pay | Admitting: Family Medicine

## 2024-04-02 ENCOUNTER — Ambulatory Visit: Admitting: Family Medicine

## 2024-04-04 ENCOUNTER — Other Ambulatory Visit: Payer: Self-pay | Admitting: Family Medicine

## 2024-04-04 DIAGNOSIS — J302 Other seasonal allergic rhinitis: Secondary | ICD-10-CM

## 2024-04-15 ENCOUNTER — Encounter: Payer: Self-pay | Admitting: Family Medicine

## 2024-04-15 ENCOUNTER — Ambulatory Visit (INDEPENDENT_AMBULATORY_CARE_PROVIDER_SITE_OTHER): Admitting: Family Medicine

## 2024-04-15 VITALS — BP 120/82 | HR 90 | Temp 98.1°F | Ht 65.0 in | Wt 157.0 lb

## 2024-04-15 DIAGNOSIS — K219 Gastro-esophageal reflux disease without esophagitis: Secondary | ICD-10-CM

## 2024-04-15 DIAGNOSIS — E7800 Pure hypercholesterolemia, unspecified: Secondary | ICD-10-CM

## 2024-04-15 DIAGNOSIS — J302 Other seasonal allergic rhinitis: Secondary | ICD-10-CM | POA: Insufficient documentation

## 2024-04-15 DIAGNOSIS — F411 Generalized anxiety disorder: Secondary | ICD-10-CM | POA: Diagnosis not present

## 2024-04-15 DIAGNOSIS — M810 Age-related osteoporosis without current pathological fracture: Secondary | ICD-10-CM | POA: Insufficient documentation

## 2024-04-15 DIAGNOSIS — F331 Major depressive disorder, recurrent, moderate: Secondary | ICD-10-CM

## 2024-04-15 DIAGNOSIS — F5105 Insomnia due to other mental disorder: Secondary | ICD-10-CM | POA: Diagnosis not present

## 2024-04-15 NOTE — Assessment & Plan Note (Signed)
 Not due for a Prolia  injection until January 2026. Only gets once a year instead of twice.

## 2024-04-15 NOTE — Assessment & Plan Note (Signed)
 Stable. Continue Temazepam  30mg  nightly. Renewed controlled substance contract and ordered urine drug screen to be completed when other labs are drawn.

## 2024-04-15 NOTE — Assessment & Plan Note (Signed)
 Stable. Continue with Wellbutrin  300mg /24 hr daily. Effective.

## 2024-04-15 NOTE — Assessment & Plan Note (Signed)
 Stable. Continue Atorvastatin  10mg  daily. Ordered lipid panel and CMP.

## 2024-04-15 NOTE — Assessment & Plan Note (Signed)
 Stable. Continue Zyrtec  10mg  at bedtime.

## 2024-04-15 NOTE — Assessment & Plan Note (Signed)
 Stable. Continue with Omeprazole  40mg  daily

## 2024-04-15 NOTE — Assessment & Plan Note (Deleted)
 Not due for a Prolia  injection until January 2026. Only gets once a year instead of twice.

## 2024-04-15 NOTE — Assessment & Plan Note (Signed)
 Stable. Continue taking Wellbutrin  300mg /24 hr daily. Effective.

## 2024-04-15 NOTE — Progress Notes (Signed)
 Established Patient Office Visit   Subjective:  Patient ID: Dawn Booker, female    DOB: [birth year]  Age: 68 y.o. MRN: [id number]  Chief Complaint  Patient presents with   Medical Management of Chronic Issues    1 month follow up     HPI Depression/Anxiety: Chronic. Patient is taking Wellbutrin  300mg /24hr daily. Patient reports this medication is effective. In the past, patient has been on several other psychiatric medications.    GERD: Chronic. Patient is prescribed Omeprazole  40mg  daily. She reports this is effective. She reports she previous came off medication. But, when she had her endoscopy, she was recommended to restart medication.    Hyperlipidemia: Chronic. Patient is taking Atorvastatin  10mg  daily. Patient was previously on Rosuvastatin  and Simvastatin . Come off medication due to dreams. Doing well with this medication.   Seasonal allergies: Chronic. Patient is taking Cetritizine 10mg  at bedtime.   Insomnia: Chronic. Patient is taking Temazepam  30mg  at night, along with Tylenol , Zyrtec , and magnesium. She reports this is effective to help her sleep. She reports she has previously been on Ambien and Alprazolam for insomnia.    Osteoporosis: Chronic. Receives Prolia  injection once a year. Does not take it twice a year due to bad side effects. Last injection 01/22.   On previous visit, patient was concerned about her memory, but reports it has improved and not concerned.  ROS See HPI above     Objective:   BP 120/82   Pulse 90   Temp 98.1 F (36.7 C) (Oral)   Ht 5' 5 (1.651 m)   Wt 157 lb (71.2 kg)   SpO2 98%   BMI 26.13 kg/m    Physical Exam Vitals reviewed.  Constitutional:      General: She is not in acute distress.    Appearance: Normal appearance. She is not ill-appearing, toxic-appearing or diaphoretic.  HENT:     Head: Normocephalic and atraumatic.  Eyes:     General:        Right eye: No discharge.        Left eye: No discharge.      Conjunctiva/sclera: Conjunctivae normal.  Cardiovascular:     Rate and Rhythm: Normal rate and regular rhythm.     Heart sounds: Normal heart sounds. No murmur heard.    No friction rub. No gallop.  Pulmonary:     Effort: Pulmonary effort is normal. No respiratory distress.     Breath sounds: Normal breath sounds.  Musculoskeletal:        General: Normal range of motion.  Skin:    General: Skin is warm and dry.  Neurological:     General: No focal deficit present.     Mental Status: She is alert and oriented to person, place, and time. Mental status is at baseline.  Psychiatric:        Mood and Affect: Mood normal.        Behavior: Behavior normal.        Thought Content: Thought content normal.        Judgment: Judgment normal.    The 10-year ASCVD risk score (Arnett DK, et al., 2019) is: 5.6%    Assessment & Plan:  Hypercholesterolemia Assessment & Plan: Stable. Continue Atorvastatin  10mg  daily. Ordered lipid panel and CMP.   Orders: -     Lipid panel; Future -     Comprehensive metabolic panel with GFR; Future  Insomnia due to mental condition Assessment & Plan: Stable. Continue Temazepam  30mg  nightly. Renewed controlled  substance contract and ordered urine drug screen to be completed when other labs are drawn.   Orders: -     DRUG MONITOR, PANEL 1, SCREEN, URINE; Future  GAD (generalized anxiety disorder) Assessment & Plan: Stable. Continue with Wellbutrin  300mg /24 hr daily. Effective.    MDD (major depressive disorder), recurrent episode, moderate (HCC) Assessment & Plan: Stable. Continue taking Wellbutrin  300mg /24 hr daily. Effective.      Gastroesophageal reflux disease without esophagitis Assessment & Plan: Stable. Continue with Omeprazole  40mg  daily    Seasonal allergies Assessment & Plan: Stable. Continue Zyrtec  10mg  at bedtime.    Osteoporosis without current pathological fracture, unspecified osteoporosis type Assessment & Plan: Not due for  a Prolia  injection until January 2026. Only gets once a year instead of twice.     Return in about 3 months (around 07/16/2024) for physical; Lab visit when fasting .   Dawn Clendenin, NP

## 2024-04-15 NOTE — Patient Instructions (Signed)
-  It was good to see you today! Hopefully the rain will be leaving us  soon! -Ordered labs. Please make an appointment when fasting.  -Renewed controlled substance agreement for management of Temazepam . Ordered urine drug screen. Obtain that when having labs drawn.  -Continue all medications.  -Follow up in 3 months for a physical.

## 2024-04-21 ENCOUNTER — Ambulatory Visit: Payer: Self-pay | Admitting: Family Medicine

## 2024-04-21 ENCOUNTER — Other Ambulatory Visit (INDEPENDENT_AMBULATORY_CARE_PROVIDER_SITE_OTHER)

## 2024-04-21 DIAGNOSIS — E7800 Pure hypercholesterolemia, unspecified: Secondary | ICD-10-CM | POA: Diagnosis not present

## 2024-04-21 LAB — LIPID PANEL
Cholesterol: 196 mg/dL (ref 0–200)
HDL: 64 mg/dL (ref >39.00)
LDL Cholesterol: 107 mg/dL — ABNORMAL HIGH (ref 0–99)
NonHDL: 132.34
Total CHOL/HDL Ratio: 3
Triglycerides: 128 mg/dL (ref 0.0–149.0)
VLDL: 25.6 mg/dL (ref 0.0–40.0)

## 2024-04-21 LAB — COMPREHENSIVE METABOLIC PANEL WITH GFR
ALT: 20 U/L (ref 0–35)
AST: 16 U/L (ref 0–37)
Albumin: 4.4 g/dL (ref 3.5–5.2)
Alkaline Phosphatase: 43 U/L (ref 39–117)
BUN: 17 mg/dL (ref 6–23)
CO2: 30 meq/L (ref 19–32)
Calcium: 9 mg/dL (ref 8.4–10.5)
Chloride: 102 meq/L (ref 96–112)
Creatinine, Ser: 0.99 mg/dL (ref 0.40–1.20)
GFR: 58.87 mL/min — ABNORMAL LOW (ref >60.00)
Glucose, Bld: 100 mg/dL — ABNORMAL HIGH (ref 70–99)
Potassium: 4.5 meq/L (ref 3.5–5.1)
Sodium: 138 meq/L (ref 135–145)
Total Bilirubin: 0.6 mg/dL (ref 0.2–1.2)
Total Protein: 7.1 g/dL (ref 6.0–8.3)

## 2024-05-06 ENCOUNTER — Other Ambulatory Visit: Payer: Self-pay | Admitting: Family Medicine

## 2024-05-06 DIAGNOSIS — E7800 Pure hypercholesterolemia, unspecified: Secondary | ICD-10-CM

## 2024-05-13 ENCOUNTER — Other Ambulatory Visit: Payer: Self-pay

## 2024-05-13 DIAGNOSIS — M810 Age-related osteoporosis without current pathological fracture: Secondary | ICD-10-CM

## 2024-05-14 ENCOUNTER — Other Ambulatory Visit: Payer: Self-pay | Admitting: *Deleted

## 2024-05-14 DIAGNOSIS — Z170 Estrogen receptor positive status [ER+]: Secondary | ICD-10-CM

## 2024-05-14 NOTE — Progress Notes (Signed)
 Pt Guardant Reveal results positive.  RN reviewed with MD and verbal orders received and placed for pt to undergo CT CAP and bone scan to evaluate for metastatic disease.  PT notified and verbalized understanding.

## 2024-05-15 ENCOUNTER — Encounter: Payer: Self-pay | Admitting: Hematology and Oncology

## 2024-05-21 ENCOUNTER — Encounter (HOSPITAL_COMMUNITY): Payer: Self-pay

## 2024-05-21 ENCOUNTER — Encounter (HOSPITAL_COMMUNITY)
Admission: RE | Admit: 2024-05-21 | Discharge: 2024-05-21 | Disposition: A | Discharge status: Home / Self Care | Source: Ambulatory Visit | Attending: Hematology and Oncology | Admitting: Hematology and Oncology

## 2024-05-21 DIAGNOSIS — Z170 Estrogen receptor positive status [ER+]: Secondary | ICD-10-CM | POA: Insufficient documentation

## 2024-05-21 DIAGNOSIS — C50812 Malignant neoplasm of overlapping sites of left female breast: Secondary | ICD-10-CM | POA: Insufficient documentation

## 2024-05-21 MED ORDER — TECHNETIUM TC 99M MEDRONATE IV KIT
21.8000 | PACK | Freq: Once | INTRAVENOUS | Status: AC
  Administered 2024-05-21: 21.8 via INTRAVENOUS

## 2024-05-28 ENCOUNTER — Ambulatory Visit (HOSPITAL_BASED_OUTPATIENT_CLINIC_OR_DEPARTMENT_OTHER)
Admission: RE | Admit: 2024-05-28 | Discharge: 2024-05-28 | Disposition: A | Discharge status: Home / Self Care | Source: Ambulatory Visit | Attending: Hematology and Oncology | Admitting: Hematology and Oncology

## 2024-05-28 DIAGNOSIS — C50812 Malignant neoplasm of overlapping sites of left female breast: Secondary | ICD-10-CM | POA: Insufficient documentation

## 2024-05-28 DIAGNOSIS — Z170 Estrogen receptor positive status [ER+]: Secondary | ICD-10-CM | POA: Diagnosis present

## 2024-05-28 MED ORDER — IOHEXOL 300 MG/ML  SOLN
100.0000 mL | Freq: Once | INTRAMUSCULAR | Status: AC | PRN
  Administered 2024-05-28: 100 mL via INTRAVENOUS

## 2024-05-31 ENCOUNTER — Other Ambulatory Visit: Payer: Self-pay | Admitting: Family Medicine

## 2024-05-31 DIAGNOSIS — F5105 Insomnia due to other mental disorder: Secondary | ICD-10-CM

## 2024-05-31 DIAGNOSIS — F331 Major depressive disorder, recurrent, moderate: Secondary | ICD-10-CM

## 2024-05-31 DIAGNOSIS — F411 Generalized anxiety disorder: Secondary | ICD-10-CM

## 2024-06-03 ENCOUNTER — Inpatient Hospital Stay: Attending: Hematology and Oncology | Admitting: Hematology and Oncology

## 2024-06-03 VITALS — BP 161/93 | HR 84 | Temp 97.3°F | Resp 16 | Wt 157.5 lb

## 2024-06-03 DIAGNOSIS — Z1721 Progesterone receptor positive status: Secondary | ICD-10-CM | POA: Insufficient documentation

## 2024-06-03 DIAGNOSIS — Z17411 Hormone receptor positive with human epidermal growth factor receptor 2 negative status: Secondary | ICD-10-CM | POA: Diagnosis not present

## 2024-06-03 DIAGNOSIS — M810 Age-related osteoporosis without current pathological fracture: Secondary | ICD-10-CM | POA: Insufficient documentation

## 2024-06-03 DIAGNOSIS — Z923 Personal history of irradiation: Secondary | ICD-10-CM | POA: Insufficient documentation

## 2024-06-03 DIAGNOSIS — Z9013 Acquired absence of bilateral breasts and nipples: Secondary | ICD-10-CM | POA: Diagnosis not present

## 2024-06-03 DIAGNOSIS — Z170 Estrogen receptor positive status [ER+]: Secondary | ICD-10-CM | POA: Diagnosis not present

## 2024-06-03 DIAGNOSIS — C50412 Malignant neoplasm of upper-outer quadrant of left female breast: Secondary | ICD-10-CM | POA: Insufficient documentation

## 2024-06-03 DIAGNOSIS — K769 Liver disease, unspecified: Secondary | ICD-10-CM

## 2024-06-03 DIAGNOSIS — Z79899 Other long term (current) drug therapy: Secondary | ICD-10-CM | POA: Insufficient documentation

## 2024-06-03 DIAGNOSIS — Z79811 Long term (current) use of aromatase inhibitors: Secondary | ICD-10-CM | POA: Insufficient documentation

## 2024-06-03 DIAGNOSIS — C50812 Malignant neoplasm of overlapping sites of left female breast: Secondary | ICD-10-CM | POA: Diagnosis not present

## 2024-06-03 NOTE — Progress Notes (Addendum)
 Patient Care Team: [name], NP as PCP - General (Family Medicine) Odean Potts, MD as Consulting Physician (Hematology and Oncology) Curvin Deward MOULD, MD as Consulting Physician (General Surgery) Causey, Morna Pickle, NP as Nurse Practitioner (Hematology and Oncology) Kristie Lamprey, MD as Consulting Physician (Gastroenterology) Rutherford Gain, MD as Consulting Physician (Obstetrics and Gynecology)  DIAGNOSIS:  Encounter Diagnosis  Name Primary?   Malignant neoplasm of overlapping sites of left breast in female, estrogen receptor positive (HCC) Yes    SUMMARY OF ONCOLOGIC HISTORY: Oncology History  Malignant neoplasm of overlapping sites of left breast in female, estrogen receptor positive (HCC)  07/2015 Miscellaneous   Genetics negative.  Genes tested: APC, ATM, BARD1, BMPR1A, BRCA1, BRCA2, BRIP1, CDH1, CDK4, CDKN2A, CHEK2, EPCAM, GREM1, MLH1, MSH2, MSH6, MUTYH, PALB2, PMS2, POLD1, POLE, PTEN, RAD51C, RAD51D, RET, SMAD4, STK11, and TP53.      05/06/2018 Initial Diagnosis   Screening detected left breast mass anteriorly 1030 to 11 o'clock position 2 masses 6 mm and 7 mm, 12:30 position 2.6 cm both of these masses biopsy-proven grade 2 invasive lobular cancer ER 100%, PR 10%, Ki-67 15%, HER-2 negative 1+ by IHC, lymph node positive for malignancy T2N1 stage IIa clinical stage   05/21/2018 Cancer Staging   Staging form: Breast, AJCC 8th Edition - Pathologic: Stage IB (pT3, pN1a, cM0, G2, ER+, PR+, HER2-) - Signed by Odean Potts, MD on 06/24/2018   06/13/2018 Surgery   Bilateral mastectomies: Left: ILC, grade 2, 5.4 cm, LCIS, margins negative, lymphovascular invasion present, 2/7 lymph nodes positive with extranodal extension (1 additional lymph node had isolated tumor cells), right mastectomy benign; T3N1a ER 100%, PR 10%, HER-2 -1+, Ki-67 10 to 15%, stage Ib   08/06/2018 - 09/25/2018 Radiation Therapy   Adjuvant XRT   10/09/2018 -  Anti-estrogen oral therapy    Anastrozole  1mg  daily, plan for 7 years; switched to tamoxifen  11/07/18 for bone health; opted to forego all antiestrogen therapy     CHIEF COMPLIANT:   HISTORY OF PRESENT ILLNESS:  History of Present Illness Dawn Booker is a 68 year old female who presents for evaluation of a new liver lesion identified on CT scan.  A recent CT scan shows a new, ill-defined, hypodense lesion in the anterior liver, measuring 1.7 cm, absent in previous scans from 2023 and March 2024. A blood marker test indicates a very low level of circulating DNA, below 0.5%, suggesting a possible false positive, as it was 0% in February.  She experienced intolerance to tamoxifen  six years ago. She expresses concern about undergoing a biopsy, preferring sedation due to a previous uncomfortable breast biopsy experience.  A bone scan reveals no bone abnormalities, with some inflammation in the knees, shoulders, and possibly the wrist, though not significant. She describes breast inflammation as 'awful'. No new symptoms or concerns are noted in the review of systems.     ALLERGIES:  is allergic to lamictal [lamotrigine], cymbalta  [duloxetine  hcl], hydrocodone , mobic [meloxicam], sulfa antibiotics, corticosteroids, morphine, other, prednisone, prozac  [fluoxetine  hcl], vortioxetine, and zoloft [sertraline].  MEDICATIONS:  Current Outpatient Medications  Medication Sig Dispense Refill   acetaminophen  (TYLENOL ) 500 MG tablet Take 500 mg by mouth at bedtime.     atorvastatin  (LIPITOR) 10 MG tablet Take 1 tablet by mouth once daily 100 tablet 0   buPROPion  (WELLBUTRIN  XL) 300 MG 24 hr tablet Take 1 tablet (300 mg total) by mouth daily. 100 tablet 1   cetirizine  (ZYRTEC ) 10 MG tablet Take 1 tablet by mouth  once daily 100 tablet 0   Cholecalciferol (VITAMIN D ) 50 MCG (2000 UT) CAPS Take by mouth. 1 capsule daily     Coenzyme Q10 (COQ10) 100 MG CAPS Take 1 capsule by mouth daily.     Cyanocobalamin  (VITAMIN B12) 500 MCG  TABS Take by mouth. 1 tablet by mouth daily     folic acid  (FOLVITE ) 400 MCG tablet Take 400 mcg by mouth daily.     MAGNESIUM PO Take by mouth.     omeprazole  (PRILOSEC) 40 MG capsule Take 1 capsule by mouth once daily 100 capsule 0   OVER THE COUNTER MEDICATION 1 tablet daily. Vegan Calcium  1500     temazepam  (RESTORIL ) 30 MG capsule TAKE 1 CAPSULE BY MOUTH ONCE DAILY AT NIGHT AT BEDTIME 100 capsule 0   No current facility-administered medications for this visit.    PHYSICAL EXAMINATION: ECOG PERFORMANCE STATUS: 1 - Symptomatic but completely ambulatory  Vitals:   06/03/24 1142  BP: (!) 161/93  Pulse: 84  Resp: 16  Temp: (!) 97.3 F (36.3 C)  SpO2: 99%   Filed Weights   06/03/24 1142  Weight: 157 lb 8 oz (71.4 kg)    LABORATORY DATA:  I have reviewed the data as listed    Latest Ref Rng & Units 04/21/2024   10:51 AM 05/21/2023   10:50 AM 01/16/2023    9:59 AM  CMP  Glucose 70 - 99 mg/dL 899  96  93   BUN 6 - 23 mg/dL 17  16  11    Creatinine 0.40 - 1.20 mg/dL 9.00  9.17  9.16   Sodium 135 - 145 mEq/L 138  139  142   Potassium 3.5 - 5.1 mEq/L 4.5  3.9  4.3   Chloride 96 - 112 mEq/L 102  104  104   CO2 19 - 32 mEq/L 30  28  24    Calcium  8.4 - 10.5 mg/dL 9.0  9.3  9.2   Total Protein 6.0 - 8.3 g/dL 7.1  6.7  6.5   Total Bilirubin 0.2 - 1.2 mg/dL 0.6  0.5  0.4   Alkaline Phos 39 - 117 U/L 43  50  50   AST 0 - 37 U/L 16  12  14    ALT 0 - 35 U/L 20  13  15      Lab Results  Component Value Date   WBC 4.5 05/21/2023   HGB 13.9 05/21/2023   HCT 42.0 05/21/2023   MCV 94.3 05/21/2023   PLT 181.0 05/21/2023   NEUTROABS 2.7 05/21/2023    ASSESSMENT & PLAN:  Malignant neoplasm of overlapping sites of left breast in female, estrogen receptor positive (HCC) 06/13/2018:Bilateral mastectomies: Left: ILC, grade 2, 5.4 cm, LCIS, margins negative, lymphovascular invasion present, 2/7 lymph nodes positive with extranodal extension (1 additional lymph node had isolated tumor  cells), right mastectomy benign; T3N1a ER 100%, PR 10%, HER-2 -1+, Ki-67 10 to 15%, stage Ib MammaPrint: Low risk, luminal type a Adjuvant radiation therapy: 08/14/2018- MRI back: L4 and L5 small lesions could not rule out metastatic disease.  PET CT negative for bone metastases.   Treatment: Tamoxifen  20 mg daily Started 11/07/2018-stopped 01/01/2019 due to profound depression/anxiety Couldn't tolerate Anastrozole    10/28/2018 PET CT scan: No evidence of metastatic disease, no hypermetabolism at L4-L5 vertebral bodies to correspond to the MRI findings.   CT abdomen 06/30/2019: No findings to suggest the cause of abdominal discomfort.  No metastatic disease Osteoporosis: T score -3.1   Breast cancer  surveillance: 11/10/2022: Ultrasound: No suspicious masses 04/29/2024: Guardant reveal: CT DNA positive less than 0.05% 06/01/2024: CT CAP: New ill-defined lesion anterior right lobe of the liver measuring 1.7 cm highly suspicious for hepatic metastasis.  MRI liver recommended.  Recommendation: Liver MRI and determine if biopsy needs to be done If the liver MRI suggest that this was a benign lesion, then we would repeat the guardant reveal in 3 months.  If it is persistently positive then we may discuss with her about restarting antiestrogen therapy.  Telephone visit after the liver MRI to discuss results Assessment & Plan Malignant neoplasm of overlapping sites of left breast Blood marker test showed minimal circulating DNA, possibly a false positive. New liver lesion not seen in previous CT scans suggests recent development. Marker test has high sensitivity but potential for false positives. - Repeat blood marker test in three months. - Consider tamoxifen  if marker levels increase.  Indeterminate liver lesion (possible hemangioma versus metastasis) New 1.7 cm hypodense liver lesion identified on CT, not present in previous scans. Suspected hemangioma but metastasis possible. MRI recommended for  further characterization. - Order MRI to evaluate liver lesion. - Consider biopsy if MRI inconclusive or suggests malignancy. - Provide sedation options for biopsy if needed.      No orders of the defined types were placed in this encounter.  The patient has a good understanding of the overall plan. she agrees with it. she will call with any problems that may develop before the next visit here. Total time spent: 30 mins including face to face time and time spent for planning, charting and co-ordination of care   Viinay K Joushua Dugar, MD 06/03/24

## 2024-06-03 NOTE — Assessment & Plan Note (Signed)
 06/13/2018:Bilateral mastectomies: Left: ILC, grade 2, 5.4 cm, LCIS, margins negative, lymphovascular invasion present, 2/7 lymph nodes positive with extranodal extension (1 additional lymph node had isolated tumor cells), right mastectomy benign; T3N1a ER 100%, PR 10%, HER-2 -1+, Ki-67 10 to 15%, stage Ib MammaPrint: Low risk, luminal type a Adjuvant radiation therapy: 08/14/2018- MRI back: L4 and L5 small lesions could not rule out metastatic disease.  PET CT negative for bone metastases.   Treatment: Tamoxifen  20 mg daily Started 11/07/2018-stopped 01/01/2019 due to profound depression/anxiety Couldn't tolerate Anastrozole    10/28/2018 PET CT scan: No evidence of metastatic disease, no hypermetabolism at L4-L5 vertebral bodies to correspond to the MRI findings.   CT abdomen 06/30/2019: No findings to suggest the cause of abdominal discomfort.  No metastatic disease Osteoporosis: T score -3.1   Breast cancer surveillance: 11/10/2022: Ultrasound: No suspicious masses 04/29/2024: Guardant reveal: CT DNA positive less than 0.05% 06/01/2024: CT CAP: New ill-defined lesion anterior right lobe of the liver measuring 1.7 cm highly suspicious for hepatic metastasis.  MRI liver recommended.  Recommendation: Liver MRI and biopsy

## 2024-06-17 ENCOUNTER — Ambulatory Visit
Admission: RE | Admit: 2024-06-17 | Discharge: 2024-06-17 | Disposition: A | Discharge status: Home / Self Care | Source: Ambulatory Visit | Attending: Hematology and Oncology | Admitting: Hematology and Oncology

## 2024-06-17 DIAGNOSIS — K769 Liver disease, unspecified: Secondary | ICD-10-CM

## 2024-06-17 MED ORDER — GADOPICLENOL 0.5 MMOL/ML IV SOLN
7.0000 mL | Freq: Once | INTRAVENOUS | Status: AC | PRN
  Administered 2024-06-17: 7 mL via INTRAVENOUS

## 2024-06-22 ENCOUNTER — Inpatient Hospital Stay: Admitting: Hematology and Oncology

## 2024-06-22 DIAGNOSIS — C50812 Malignant neoplasm of overlapping sites of left female breast: Secondary | ICD-10-CM | POA: Diagnosis not present

## 2024-06-22 DIAGNOSIS — Z170 Estrogen receptor positive status [ER+]: Secondary | ICD-10-CM | POA: Diagnosis not present

## 2024-06-22 NOTE — Progress Notes (Signed)
 HEMATOLOGY-ONCOLOGY TELEPHONE VISIT PROGRESS NOTE  I connected with our patient on 06/22/24 at 11:00 AM EDT by telephone and verified that I am speaking with the correct person using two identifiers.  I discussed the limitations, risks, security and privacy concerns of performing an evaluation and management service by telephone and the availability of in person appointments.  I also discussed with the patient that there may be a patient responsible charge related to this service. The patient expressed understanding and agreed to proceed.   History of Present Illness:    History of Present Illness Dawn Booker is a 68 year old female who presents for evaluation of a new 1.4 cm nonspecific mass in the right liver lobe.  A CT scan and MRI have identified a new 1.4 cm nonspecific mass in the right liver lobe. The imaging results are inconclusive in differentiating between a benign hemangioma and metastases.      Oncology History  Malignant neoplasm of overlapping sites of left breast in female, estrogen receptor positive (HCC)  07/2015 Miscellaneous   Genetics negative.  Genes tested: APC, ATM, BARD1, BMPR1A, BRCA1, BRCA2, BRIP1, CDH1, CDK4, CDKN2A, CHEK2, EPCAM, GREM1, MLH1, MSH2, MSH6, MUTYH, PALB2, PMS2, POLD1, POLE, PTEN, RAD51C, RAD51D, RET, SMAD4, STK11, and TP53.      05/06/2018 Initial Diagnosis   Screening detected left breast mass anteriorly 1030 to 11 o'clock position 2 masses 6 mm and 7 mm, 12:30 position 2.6 cm both of these masses biopsy-proven grade 2 invasive lobular cancer ER 100%, PR 10%, Ki-67 15%, HER-2 negative 1+ by IHC, lymph node positive for malignancy T2N1 stage IIa clinical stage   05/21/2018 Cancer Staging   Staging form: Breast, AJCC 8th Edition - Pathologic: Stage IB (pT3, pN1a, cM0, G2, ER+, PR+, HER2-) - Signed by Odean Potts, MD on 06/24/2018   06/13/2018 Surgery   Bilateral mastectomies: Left: ILC, grade 2, 5.4 cm, LCIS, margins negative,  lymphovascular invasion present, 2/7 lymph nodes positive with extranodal extension (1 additional lymph node had isolated tumor cells), right mastectomy benign; T3N1a ER 100%, PR 10%, HER-2 -1+, Ki-67 10 to 15%, stage Ib   08/06/2018 - 09/25/2018 Radiation Therapy   Adjuvant XRT   10/09/2018 -  Anti-estrogen oral therapy   Anastrozole  1mg  daily, plan for 7 years; switched to tamoxifen  11/07/18 for bone health; opted to forego all antiestrogen therapy     REVIEW OF SYSTEMS:   Constitutional: Denies fevers, chills or abnormal weight loss All other systems were reviewed with the patient and are negative. Observations/Objective:     Assessment Plan:  Malignant neoplasm of overlapping sites of left breast in female, estrogen receptor positive (HCC) 06/13/2018:Bilateral mastectomies: Left: ILC, grade 2, 5.4 cm, LCIS, margins negative, lymphovascular invasion present, 2/7 lymph nodes positive with extranodal extension (1 additional lymph node had isolated tumor cells), right mastectomy benign; T3N1a ER 100%, PR 10%, HER-2 -1+, Ki-67 10 to 15%, stage Ib MammaPrint: Low risk, luminal type a Adjuvant radiation therapy: 08/14/2018- MRI back: L4 and L5 small lesions could not rule out metastatic disease.  PET CT negative for bone metastases.   Treatment: Tamoxifen  20 mg daily Started 11/07/2018-stopped 01/01/2019 due to profound depression/anxiety Couldn't tolerate Anastrozole    10/28/2018 PET CT scan: No evidence of metastatic disease, no hypermetabolism at L4-L5 vertebral bodies to correspond to the MRI findings.   CT abdomen 06/30/2019: No findings to suggest the cause of abdominal discomfort.  No metastatic disease Osteoporosis: T score -3.1   Breast cancer surveillance: 11/10/2022: Ultrasound: No  suspicious masses 04/29/2024: Guardant reveal: CT DNA positive less than 0.05% 06/01/2024: CT CAP: New ill-defined lesion anterior right lobe of the liver measuring 1.7 cm highly suspicious for hepatic  metastasis.    Liver MRI 06/17/2024: Multiple small cysts both right and left hepatic lobes: Differential diagnosis metastasis and sclerosing hemangioma  Treatment plan: I discussed the role of biopsy versus PET CT scan and we decided to pursue the PET CT scan. I would like to give her a phone call after the PET scan to discuss results.       I discussed the assessment and treatment plan with the patient. The patient was provided an opportunity to ask questions and all were answered. The patient agreed with the plan and demonstrated an understanding of the instructions. The patient was advised to call back or seek an in-person evaluation if the symptoms worsen or if the condition fails to improve as anticipated.   I provided 20 minutes of non-face-to-face time during this encounter.  This includes time for charting and coordination of care   Naomi MARLA Chad, MD

## 2024-06-22 NOTE — Assessment & Plan Note (Signed)
 06/13/2018:Bilateral mastectomies: Left: ILC, grade 2, 5.4 cm, LCIS, margins negative, lymphovascular invasion present, 2/7 lymph nodes positive with extranodal extension (1 additional lymph node had isolated tumor cells), right mastectomy benign; T3N1a ER 100%, PR 10%, HER-2 -1+, Ki-67 10 to 15%, stage Ib MammaPrint: Low risk, luminal type a Adjuvant radiation therapy: 08/14/2018- MRI back: L4 and L5 small lesions could not rule out metastatic disease.  PET CT negative for bone metastases.   Treatment: Tamoxifen  20 mg daily Started 11/07/2018-stopped 01/01/2019 due to profound depression/anxiety Couldn't tolerate Anastrozole    10/28/2018 PET CT scan: No evidence of metastatic disease, no hypermetabolism at L4-L5 vertebral bodies to correspond to the MRI findings.   CT abdomen 06/30/2019: No findings to suggest the cause of abdominal discomfort.  No metastatic disease Osteoporosis: T score -3.1   Breast cancer surveillance: 11/10/2022: Ultrasound: No suspicious masses 04/29/2024: Guardant reveal: CT DNA positive less than 0.05% 06/01/2024: CT CAP: New ill-defined lesion anterior right lobe of the liver measuring 1.7 cm highly suspicious for hepatic metastasis.    Liver MRI 06/17/2024: Multiple small cysts both right and left hepatic lobes: Differential diagnosis metastasis and sclerosing hemangioma  Treatment plan: I discussed the role of biopsy versus restarting antiestrogen therapy.

## 2024-07-01 ENCOUNTER — Other Ambulatory Visit: Payer: Self-pay | Admitting: Family Medicine

## 2024-07-01 DIAGNOSIS — K219 Gastro-esophageal reflux disease without esophagitis: Secondary | ICD-10-CM

## 2024-07-03 ENCOUNTER — Encounter (HOSPITAL_COMMUNITY)
Admission: RE | Admit: 2024-07-03 | Discharge: 2024-07-03 | Disposition: A | Discharge status: Home / Self Care | Source: Ambulatory Visit | Attending: Hematology and Oncology | Admitting: Hematology and Oncology

## 2024-07-03 DIAGNOSIS — Z170 Estrogen receptor positive status [ER+]: Secondary | ICD-10-CM | POA: Diagnosis present

## 2024-07-03 DIAGNOSIS — C50812 Malignant neoplasm of overlapping sites of left female breast: Secondary | ICD-10-CM | POA: Diagnosis present

## 2024-07-03 LAB — GLUCOSE, CAPILLARY: Glucose-Capillary: 100 mg/dL — ABNORMAL HIGH (ref 70–99)

## 2024-07-03 MED ORDER — FLUDEOXYGLUCOSE F - 18 (FDG) INJECTION
7.6000 | Freq: Once | INTRAVENOUS | Status: AC | PRN
Start: 2024-07-03 — End: 2024-07-03
  Administered 2024-07-03: 7.8 via INTRAVENOUS

## 2024-07-08 ENCOUNTER — Encounter: Payer: Self-pay | Admitting: Hematology and Oncology

## 2024-07-08 ENCOUNTER — Inpatient Hospital Stay: Attending: Hematology and Oncology | Admitting: Hematology and Oncology

## 2024-07-08 DIAGNOSIS — C50812 Malignant neoplasm of overlapping sites of left female breast: Secondary | ICD-10-CM

## 2024-07-08 DIAGNOSIS — Z9013 Acquired absence of bilateral breasts and nipples: Secondary | ICD-10-CM | POA: Diagnosis not present

## 2024-07-08 DIAGNOSIS — C787 Secondary malignant neoplasm of liver and intrahepatic bile duct: Secondary | ICD-10-CM | POA: Diagnosis not present

## 2024-07-08 DIAGNOSIS — Z170 Estrogen receptor positive status [ER+]: Secondary | ICD-10-CM

## 2024-07-08 DIAGNOSIS — C50911 Malignant neoplasm of unspecified site of right female breast: Secondary | ICD-10-CM

## 2024-07-08 MED ORDER — LETROZOLE 2.5 MG PO TABS: 2.5000 mg | ORAL_TABLET | Freq: Every day | ORAL | 3 refills | Status: AC

## 2024-07-08 MED ORDER — ALPRAZOLAM 0.5 MG PO TABS: 1.0000 mg | ORAL_TABLET | ORAL | 0 refills | Status: DC | PRN

## 2024-07-08 NOTE — Assessment & Plan Note (Addendum)
 06/13/2018:Bilateral mastectomies: Left: ILC, grade 2, 5.4 cm, LCIS, margins negative, lymphovascular invasion present, 2/7 lymph nodes positive with extranodal extension (1 additional lymph node had isolated tumor cells), right mastectomy benign; T3N1a ER 100%, PR 10%, HER-2 -1+, Ki-67 10 to 15%, stage Ib MammaPrint: Low risk, luminal type a Adjuvant radiation therapy: 08/14/2018- MRI back: L4 and L5 small lesions could not rule out metastatic disease.  PET CT negative for bone metastases.   Treatment: Tamoxifen  20 mg daily Started 11/07/2018-stopped 01/01/2019 due to profound depression/anxiety Couldn't tolerate Anastrozole    10/28/2018 PET CT scan: No evidence of metastatic disease, no hypermetabolism at L4-L5 vertebral bodies to correspond to the MRI findings.   CT abdomen 06/30/2019: No findings to suggest the cause of abdominal discomfort.  No metastatic disease Osteoporosis: T score -3.1   Breast cancer surveillance: 11/10/2022: Ultrasound: No suspicious masses 04/29/2024: Guardant reveal: CT DNA positive less than 0.05% 06/01/2024: CT CAP: New ill-defined lesion anterior right lobe of the liver measuring 1.7 cm highly suspicious for hepatic metastasis.    Liver MRI 06/17/2024: Multiple small cysts both right and left hepatic lobes: Differential diagnosis metastasis and sclerosing hemangioma  PET CT 07/06/24: Isolated liver met, hyper met porta hep LN  Plan: Liver biopsy Restart anti-estrogens Once a breast prognostic panel is available on the liver biopsy we can consider adding CDK 4 and 6 inhibitors

## 2024-07-08 NOTE — Progress Notes (Addendum)
 Telephone visit:  Patient Care Team: [name], NP as PCP - General (Family Medicine) Odean Potts, MD as Consulting Physician (Hematology and Oncology) Curvin Deward MOULD, MD as Consulting Physician (General Surgery) Causey, Morna Pickle, NP as Nurse Practitioner (Hematology and Oncology) Kristie Lamprey, MD as Consulting Physician (Gastroenterology) Rutherford Gain, MD as Consulting Physician (Obstetrics and Gynecology)  DIAGNOSIS:  Encounter Diagnosis  Name Primary?   Malignant neoplasm of overlapping sites of left breast in female, estrogen receptor positive (HCC)     SUMMARY OF ONCOLOGIC HISTORY: Oncology History  Malignant neoplasm of overlapping sites of left breast in female, estrogen receptor positive (HCC)  07/2015 Miscellaneous   Genetics negative.  Genes tested: APC, ATM, BARD1, BMPR1A, BRCA1, BRCA2, BRIP1, CDH1, CDK4, CDKN2A, CHEK2, EPCAM, GREM1, MLH1, MSH2, MSH6, MUTYH, PALB2, PMS2, POLD1, POLE, PTEN, RAD51C, RAD51D, RET, SMAD4, STK11, and TP53.      05/06/2018 Initial Diagnosis   Screening detected left breast mass anteriorly 1030 to 11 o'clock position 2 masses 6 mm and 7 mm, 12:30 position 2.6 cm both of these masses biopsy-proven grade 2 invasive lobular cancer ER 100%, PR 10%, Ki-67 15%, HER-2 negative 1+ by IHC, lymph node positive for malignancy T2N1 stage IIa clinical stage   05/21/2018 Cancer Staging   Staging form: Breast, AJCC 8th Edition - Pathologic: Stage IB (pT3, pN1a, cM0, G2, ER+, PR+, HER2-) - Signed by Odean Potts, MD on 06/24/2018   06/13/2018 Surgery   Bilateral mastectomies: Left: ILC, grade 2, 5.4 cm, LCIS, margins negative, lymphovascular invasion present, 2/7 lymph nodes positive with extranodal extension (1 additional lymph node had isolated tumor cells), right mastectomy benign; T3N1a ER 100%, PR 10%, HER-2 -1+, Ki-67 10 to 15%, stage Ib   08/06/2018 - 09/25/2018 Radiation Therapy   Adjuvant XRT   10/09/2018 -   Anti-estrogen oral therapy   Anastrozole  1mg  daily, plan for 7 years; switched to tamoxifen  11/07/18 for bone health; opted to forego all antiestrogen therapy     CHIEF COMPLIANT: Follow-up to discuss results of PET CT scan   HISTORY OF PRESENT ILLNESS:  History of Present Illness Dawn Booker is a 68 year old female with breast cancer who presents for evaluation of a liver lesion.  A recent PET CT scan identified a liver lesion with metabolic activity and a few lymph nodes around the liver. No other areas of concern were noted in the bones, lungs, breasts, or other organs.  She has previously experienced poor tolerance to tamoxifen  and anastrozole . She is concerned about the potential impact of letrozole on her osteoporosis, confirmed by a bone density scan with a T-score of -2.9. She receives Prolia  annually and is considering switching to Zometa.  The Gardant 360 test detected a mutation in the cancer cells with a fraction of 0.147%, identifying a PIK3CA mutation.  She takes temazepam  at night for sleep and is sensitive to medications, experiencing side effects easily.     ALLERGIES:  is allergic to lamictal [lamotrigine], cymbalta  [duloxetine  hcl], hydrocodone , mobic [meloxicam], sulfa antibiotics, corticosteroids, morphine, other, prednisone, prozac  [fluoxetine  hcl], vortioxetine, and zoloft [sertraline].  MEDICATIONS:  Current Outpatient Medications  Medication Sig Dispense Refill   acetaminophen  (TYLENOL ) 500 MG tablet Take 500 mg by mouth at bedtime.     atorvastatin  (LIPITOR) 10 MG tablet Take 1 tablet by mouth once daily 100 tablet 0   buPROPion  (WELLBUTRIN  XL) 300 MG 24 hr tablet Take 1 tablet (300 mg total) by mouth daily. 100 tablet 1  cetirizine  (ZYRTEC ) 10 MG tablet Take 1 tablet by mouth once daily 100 tablet 0   Cholecalciferol (VITAMIN D ) 50 MCG (2000 UT) CAPS Take by mouth. 1 capsule daily     Coenzyme Q10 (COQ10) 100 MG CAPS Take 1 capsule by mouth daily.      Cyanocobalamin  (VITAMIN B12) 500 MCG TABS Take by mouth. 1 tablet by mouth daily     folic acid  (FOLVITE ) 400 MCG tablet Take 400 mcg by mouth daily.     MAGNESIUM PO Take by mouth.     omeprazole  (PRILOSEC) 40 MG capsule Take 1 capsule by mouth once daily 100 capsule 0   OVER THE COUNTER MEDICATION 1 tablet daily. Vegan Calcium  1500     temazepam  (RESTORIL ) 30 MG capsule TAKE 1 CAPSULE BY MOUTH ONCE DAILY AT NIGHT AT BEDTIME 100 capsule 0   No current facility-administered medications for this visit.    PHYSICAL EXAMINATION: ECOG PERFORMANCE STATUS: 1 - Symptomatic but completely ambulatory  There were no vitals filed for this visit. There were no vitals filed for this visit.  Physical Exam   (exam performed in the presence of a chaperone)  LABORATORY DATA:  I have reviewed the data as listed    Latest Ref Rng & Units 04/21/2024   10:51 AM 05/21/2023   10:50 AM 01/16/2023    9:59 AM  CMP  Glucose 70 - 99 mg/dL 899  96  93   BUN 6 - 23 mg/dL 17  16  11    Creatinine 0.40 - 1.20 mg/dL 9.00  9.17  9.16   Sodium 135 - 145 mEq/L 138  139  142   Potassium 3.5 - 5.1 mEq/L 4.5  3.9  4.3   Chloride 96 - 112 mEq/L 102  104  104   CO2 19 - 32 mEq/L 30  28  24    Calcium  8.4 - 10.5 mg/dL 9.0  9.3  9.2   Total Protein 6.0 - 8.3 g/dL 7.1  6.7  6.5   Total Bilirubin 0.2 - 1.2 mg/dL 0.6  0.5  0.4   Alkaline Phos 39 - 117 U/L 43  50  50   AST 0 - 37 U/L 16  12  14    ALT 0 - 35 U/L 20  13  15      Lab Results  Component Value Date   WBC 4.5 05/21/2023   HGB 13.9 05/21/2023   HCT 42.0 05/21/2023   MCV 94.3 05/21/2023   PLT 181.0 05/21/2023   NEUTROABS 2.7 05/21/2023    ASSESSMENT & PLAN:  Malignant neoplasm of overlapping sites of left breast in female, estrogen receptor positive (HCC) 06/13/2018:Bilateral mastectomies: Left: ILC, grade 2, 5.4 cm, LCIS, margins negative, lymphovascular invasion present, 2/7 lymph nodes positive with extranodal extension (1 additional lymph node  had isolated tumor cells), right mastectomy benign; T3N1a ER 100%, PR 10%, HER-2 -1+, Ki-67 10 to 15%, stage Ib MammaPrint: Low risk, luminal type a Adjuvant radiation therapy: 08/14/2018- MRI back: L4 and L5 small lesions could not rule out metastatic disease.  PET CT negative for bone metastases.   Treatment: Tamoxifen  20 mg daily Started 11/07/2018-stopped 01/01/2019 due to profound depression/anxiety Couldn't tolerate Anastrozole    10/28/2018 PET CT scan: No evidence of metastatic disease, no hypermetabolism at L4-L5 vertebral bodies to correspond to the MRI findings.   CT abdomen 06/30/2019: No findings to suggest the cause of abdominal discomfort.  No metastatic disease Osteoporosis: T score -3.1   Breast cancer surveillance: 11/10/2022: Ultrasound: No  suspicious masses 04/29/2024: Guardant reveal: CT DNA positive less than 0.05% 06/01/2024: CT CAP: New ill-defined lesion anterior right lobe of the liver measuring 1.7 cm highly suspicious for hepatic metastasis.    Liver MRI 06/17/2024: Multiple small cysts both right and left hepatic lobes: Differential diagnosis metastasis and sclerosing hemangioma  PET CT 07/06/24: Isolated liver met, hyper met porta hep LN Guardant360 07/07/2024: PIK3CA mutation  Plan: Liver biopsy Restart anti-estrogens: Send a prescription for letrozole Once a breast prognostic panel is available on the liver biopsy we can consider adding CDK 4 and 6 inhibitors Concern for osteoporosis T-score -2.9: Patient cannot tolerate Prolia  well.  We discussed about doing Zometa once a year.  Telephone visit after the liver biopsy to discuss about final pathology report and to discuss initiating Verzinio. ------------------------------------- Assessment and Plan Assessment & Plan Malignant neoplasm of left breast with possible liver metastasis PET CT scan showed a metabolically active liver lesion, likely metastatic breast cancer. Blood test identified a mutation not  currently guiding treatment. Assuming estrogen receptor positive breast cancer, antiestrogen therapy is planned. - Ordered liver biopsy to confirm diagnosis and assess estrogen receptor status. - Prescribed letrozole to start immediately, to be taken at night. - Consider Verzenio after confirmation of estrogen receptor positivity. - Provided Xanax for anxiety management during biopsy.  Osteoporosis Bone density indicates osteoporosis with a T-score of -2.9. Discussed osteoporosis management in context of letrozole use, considering Zometa due to medication sensitivity. - Consider Zometa infusion annually. - Continue current osteoporosis management with Prolia  if preferred.      No orders of the defined types were placed in this encounter.  The patient has a good understanding of the overall plan. she agrees with it. she will call with any problems that may develop before the next visit here.  I personally spent a total of 30 minutes in the care of the patient today including preparing to see the patient, getting/reviewing separately obtained history, performing a medically appropriate exam/evaluation, counseling and educating, placing orders, referring and communicating with other health care professionals, documenting clinical information in the EHR, independently interpreting results, communicating results, and coordinating care.   Viinay K Luba Matzen, MD 07/08/24

## 2024-07-09 ENCOUNTER — Encounter (HOSPITAL_COMMUNITY): Payer: Self-pay

## 2024-07-09 NOTE — Progress Notes (Signed)
 Dawn Booker POUR, MD  Tate Zagal Approved for attempted US  guided biopsy of lesion in the RIGHT hepatic lobe.  Hx breast cancer.  Seen on MRI and PET.  Mod sedation.  HKM       Previous Messages    ----- Message ----- From: Glady Ouderkirk Sent: 07/08/2024   9:06 AM EST To: Danilyn Cocke; Ir Procedure Requests Subject: Us  biopsy liver                                Procedure : US  liver biopsy  Reason : Liver metastasis Dx: Malignant neoplasm of overlapping sites of left breast in female, estrogen receptor positive (HCC) [C50.812, Z17.0 (ICD-10-CM)]; Breast cancer metastasized to liver, right (HCC) [C50.911, C78.7 (ICD-10-CM)]   History : NM PET skull base to thigh , MR liver w/wo , CT abd pelv w/ , NM Bone scan whole body  Provider : Odean Potts, MD  Contact : [phone]

## 2024-07-15 ENCOUNTER — Other Ambulatory Visit

## 2024-07-17 ENCOUNTER — Other Ambulatory Visit

## 2024-07-20 ENCOUNTER — Other Ambulatory Visit: Payer: Self-pay | Admitting: Family Medicine

## 2024-07-20 DIAGNOSIS — J302 Other seasonal allergic rhinitis: Secondary | ICD-10-CM

## 2024-07-21 ENCOUNTER — Ambulatory Visit: Payer: Self-pay | Admitting: Family Medicine

## 2024-07-21 ENCOUNTER — Encounter: Payer: Self-pay | Admitting: Family Medicine

## 2024-07-21 ENCOUNTER — Ambulatory Visit (INDEPENDENT_AMBULATORY_CARE_PROVIDER_SITE_OTHER): Admitting: Family Medicine

## 2024-07-21 VITALS — BP 126/84 | HR 80 | Temp 97.9°F | Ht 65.0 in | Wt 159.0 lb

## 2024-07-21 DIAGNOSIS — N289 Disorder of kidney and ureter, unspecified: Secondary | ICD-10-CM | POA: Diagnosis not present

## 2024-07-21 DIAGNOSIS — F5101 Primary insomnia: Secondary | ICD-10-CM

## 2024-07-21 DIAGNOSIS — Z0000 Encounter for general adult medical examination without abnormal findings: Secondary | ICD-10-CM

## 2024-07-21 DIAGNOSIS — J302 Other seasonal allergic rhinitis: Secondary | ICD-10-CM | POA: Diagnosis not present

## 2024-07-21 LAB — COMPREHENSIVE METABOLIC PANEL WITH GFR
ALT: 20 U/L (ref 0–35)
AST: 15 U/L (ref 0–37)
Albumin: 4.6 g/dL (ref 3.5–5.2)
Alkaline Phosphatase: 52 U/L (ref 39–117)
BUN: 14 mg/dL (ref 6–23)
CO2: 29 meq/L (ref 19–32)
Calcium: 9.4 mg/dL (ref 8.4–10.5)
Chloride: 101 meq/L (ref 96–112)
Creatinine, Ser: 0.8 mg/dL (ref 0.40–1.20)
GFR: 75.89 mL/min (ref >60.00)
Glucose, Bld: 97 mg/dL (ref 70–99)
Potassium: 3.9 meq/L (ref 3.5–5.1)
Sodium: 138 meq/L (ref 135–145)
Total Bilirubin: 0.6 mg/dL (ref 0.2–1.2)
Total Protein: 7.1 g/dL (ref 6.0–8.3)

## 2024-07-21 MED ORDER — CETIRIZINE HCL 10 MG PO TABS: 10.0000 mg | ORAL_TABLET | Freq: Every day | ORAL | 0 refills | Status: AC

## 2024-07-21 NOTE — Progress Notes (Signed)
 Complete physical exam  Patient: Dawn Booker   DOB: 06-Dec-1955   68 y.o. Female  MRN: [id number]  Subjective:    Chief Complaint  Patient presents with   Annual Exam    Dawn Booker is a 68 y.o. female who presents today for a complete physical exam. She reports consuming a general diet. Exercise: Not consistently riding stationary bike.  She generally feels fairly well. She reports sleeping poorly. She does not have additional problems to discuss today.    Most recent fall risk assessment:    07/21/2024   11:07 AM  Fall Risk   Falls in the past year? 0  Number falls in past yr: 0  Injury with Fall? 0  Risk for fall due to : No Fall Risks  Follow up Falls evaluation completed     Most recent depression screenings:    07/21/2024   11:07 AM 04/15/2024   11:24 AM  PHQ 2/9 Scores  PHQ - 2 Score 2 2  PHQ- 9 Score 5 5      Data saved with a previous flowsheet row definition    Vision:Within last year and Dental: No current dental problems and Receives regular dental care  Past Medical History:  Diagnosis Date   ADD (attention deficit disorder)    Anxiety    breast ca 2019   Left breast cancer   Depression    GERD (gastroesophageal reflux disease)    Hiatal hernia    History of radiation therapy 08/13/2018- 09/25/2018   Left Chest wall and IM nodes/ 50 Gy in 25 fractions, Left supraclaicular fossa and PAB/ 50 Gy in 25 fractions, Left chest wall scar boost/ 10 Gy in 5 fractions.    Hyperlipemia, mixed    Intermittent palpitations    Malignant neoplasm of overlapping sites of left breast in female, estrogen receptor positive (HCC) 05/06/2018   oncologist-  dr odean--  Invasive Lobular Cancer, CIS, Stage IB, Grade 2 (pT3,pN1a,cM0), ER+---  s/p  left mastectomy with sln dissections and right mastectomy (benign)   Osteoporosis    PONV (postoperative nausea and vomiting)    Right thyroid  nodule    Shingles 12/2017   Tennis elbow    Past Surgical History:   Procedure Laterality Date   AXILLARY LYMPH NODE DISSECTION Left 07/09/2018   Procedure: COMPLETION OF LEFT AXILLARY LYMPH NODE DISSECTION;  Surgeon: Curvin Mt III, MD;  Location: [facility];  Service: General;  Laterality: Left;   BREAST EXCISIONAL BIOPSY Left 1995   benign   CARDIOVASCULAR STRESS TEST  06/21/2017   normal nuclear stress study w/ no ischemia/  normal LV function and wall function, nuclear stress ef 70%   ELBOW SURGERY Left child   MASTECTOMY W/ SENTINEL NODE BIOPSY Left 06/13/2018   MASTECTOMY WITH RADIOACTIVE SEED GUIDED EXCISION AND AXILLARY SENTINEL LYMPH NODE BIOPSY Bilateral 06/13/2018   Procedure: LEFT MASTECTOMY WITH SENTINEL LYMPH NODE MAPPING AND TARGETED NODE DISSECTION AND RIGHT PROPHYLATIC MASTECTOMY;  Surgeon: Curvin Mt MOULD, MD;  Location: [facility];  Service: General;  Laterality: Bilateral;   THYROIDECTOMY Left 10-31-2001   dr gerkin @[facility]    TRANSTHORACIC ECHOCARDIOGRAM  06/21/2017   ef 60-65%/  trivial MR (no evidence mvp)   TUBAL LIGATION  yrs ago   VAGINAL HYSTERECTOMY  1990s   Social History   Tobacco Use   Smoking status: Former    Current packs/day: 0.50    Average packs/day: 0.5 packs/day for 51.9 years (25.9 ttl pk-yrs)    Types: Cigarettes  Start date: 66   Smokeless tobacco: Never  Vaping Use   Vaping status: Never Used  Substance Use Topics   Alcohol use: Not Currently   Drug use: No   Social History   Socioeconomic History   Marital status: Married    Spouse name: Not on file   Number of children: 2   Years of education: 12   Highest education level: 12th grade  Occupational History   Not on file  Tobacco Use   Smoking status: Former    Current packs/day: 0.50    Average packs/day: 0.5 packs/day for 51.9 years (25.9 ttl pk-yrs)    Types: Cigarettes    Start date: 72   Smokeless tobacco: Never  Vaping Use   Vaping status: Never Used  Substance and Sexual Activity   Alcohol use: Not Currently   Drug use: No   Sexual  activity: Yes    Partners: Male    Birth control/protection: None  Other Topics Concern   Not on file  Social History Narrative   Lives at home with husband.   Social Drivers of Corporate Investment Banker Strain: Low Risk  (04/14/2024)   Overall Financial Resource Strain (CARDIA)    Difficulty of Paying Living Expenses: Not hard at all  Food Insecurity: No Food Insecurity (04/14/2024)   Hunger Vital Sign    Worried About Running Out of Food in the Last Year: Never true    Ran Out of Food in the Last Year: Never true  Transportation Needs: No Transportation Needs (04/14/2024)   PRAPARE - Administrator, Civil Service (Medical): No    Lack of Transportation (Non-Medical): No  Physical Activity: Inactive (04/14/2024)   Exercise Vital Sign    Days of Exercise per Week: 0 days    Minutes of Exercise per Session: Not on file  Stress: No Stress Concern Present (04/14/2024)   [employer] of Occupational Health - Occupational Stress Questionnaire    Feeling of Stress: Only a little  Social Connections: Unknown (04/14/2024)   Social Connection and Isolation Panel    Frequency of Communication with Friends and Family: Twice a week    Frequency of Social Gatherings with Friends and Family: Patient declined    Attends Religious Services: Patient declined    Database Administrator or Organizations: Patient declined    Attends Banker Meetings: Not on file    Marital Status: Married  Intimate Partner Violence: Not At Risk (01/24/2023)   Humiliation, Afraid, Rape, and Kick questionnaire    Fear of Current or Ex-Partner: No    Emotionally Abused: No    Physically Abused: No    Sexually Abused: No   Family Status  Relation Name Status   Mother  Deceased   Father  Deceased   Sister  Deceased   Brother  Alive   Daughter  Alive   Son  Alive   MGM  (Not Specified)   Cousin x 2 (maternal) Alive   H Sister  Alive   H Sister Twin sister Alive   H Sister Twin  sister Deceased   H Brother  Alive  No partnership data on file   Family History  Problem Relation Age of Onset   Hypertension Mother    Heart attack Mother    Breast cancer Mother    Bipolar disorder Father        Sucide   Other Sister        Passed at birth  Breast cancer Maternal Grandmother    Breast cancer Cousin    Emphysema Half-Sister    Alcohol abuse Half-Brother    Stroke Half-Brother    Allergies  Allergen Reactions   Lamictal [Lamotrigine] Other (See Comments)    Elspeth Louder Syndrome   Cymbalta  [Duloxetine  Hcl]     Elspeth Louder syndrome   Hydrocodone  Nausea Only   Mobic [Meloxicam]     Same ingredients as lamictal   Sulfa Antibiotics Hives   Corticosteroids Rash   Morphine Palpitations and Other (See Comments)   Other     MYCINS - severe abdominal cramping    Prednisone Rash        Prozac  [Fluoxetine  Hcl] Palpitations   Vortioxetine Anxiety and Other (See Comments)    Increased anxiety   Zoloft [Sertraline] Anxiety      Patient Care Team: [name], NP as PCP - General (Family Medicine) Odean Potts, MD as Consulting Physician (Hematology and Oncology) Curvin Deward MOULD, MD as Consulting Physician (General Surgery) Causey, Morna Pickle, NP as Nurse Practitioner (Hematology and Oncology) Kristie Lamprey, MD as Consulting Physician (Gastroenterology) Rutherford Gain, MD as Consulting Physician (Obstetrics and Gynecology)   Outpatient Medications Prior to Visit  Medication Sig   acetaminophen  (TYLENOL ) 500 MG tablet Take 500 mg by mouth at bedtime.   ALPRAZolam (XANAX) 0.5 MG tablet Take 2 tablets (1 mg total) by mouth as needed for anxiety. Please take 2 tablets 1 hour before liver biopsy   atorvastatin  (LIPITOR) 10 MG tablet Take 1 tablet by mouth once daily   buPROPion  (WELLBUTRIN  XL) 300 MG 24 hr tablet Take 1 tablet (300 mg total) by mouth daily.   Cholecalciferol (VITAMIN D ) 50 MCG (2000 UT) CAPS Take by mouth. 1 capsule  daily   Coenzyme Q10 (COQ10) 100 MG CAPS Take 1 capsule by mouth daily.   Cyanocobalamin  (VITAMIN B12) 500 MCG TABS Take by mouth. 1 tablet by mouth daily   folic acid  (FOLVITE ) 400 MCG tablet Take 400 mcg by mouth daily.   letrozole (FEMARA) 2.5 MG tablet Take 1 tablet (2.5 mg total) by mouth daily.   MAGNESIUM PO Take by mouth.   omeprazole  (PRILOSEC) 40 MG capsule Take 1 capsule by mouth once daily   OVER THE COUNTER MEDICATION 1 tablet daily. Vegan Calcium  1500   temazepam  (RESTORIL ) 30 MG capsule TAKE 1 CAPSULE BY MOUTH ONCE DAILY AT NIGHT AT BEDTIME   [DISCONTINUED] cetirizine  (ZYRTEC ) 10 MG tablet Take 1 tablet by mouth once daily   No facility-administered medications prior to visit.    ROS See HPI above    Objective:   BP 126/84   Pulse 80   Temp 97.9 F (36.6 C) (Oral)   Ht 5' 5 (1.651 m)   Wt 159 lb (72.1 kg)   SpO2 97%   BMI 26.46 kg/m    Physical Exam Vitals reviewed.  Constitutional:      General: She is not in acute distress.    Appearance: Normal appearance. She is overweight. She is not ill-appearing or toxic-appearing.  HENT:     Head: Normocephalic and atraumatic.     Right Ear: Tympanic membrane, ear canal and external ear normal. There is no impacted cerumen.     Left Ear: Tympanic membrane, ear canal and external ear normal. There is no impacted cerumen.     Nose:     Right Sinus: No maxillary sinus tenderness or frontal sinus tenderness.     Left Sinus: No maxillary sinus tenderness or  frontal sinus tenderness.     Mouth/Throat:     Mouth: Mucous membranes are moist.     Pharynx: Oropharynx is clear. Uvula midline. No pharyngeal swelling, oropharyngeal exudate, posterior oropharyngeal erythema or uvula swelling.  Eyes:     General:        Right eye: No discharge.        Left eye: No discharge.     Conjunctiva/sclera: Conjunctivae normal.     Pupils: Pupils are equal, round, and reactive to light.  Neck:     Thyroid : No thyromegaly.   Cardiovascular:     Rate and Rhythm: Normal rate and regular rhythm.     Pulses:          Posterior tibial pulses are 2+ on the right side and 2+ on the left side.     Heart sounds: Normal heart sounds. No murmur heard.    No friction rub. No gallop.  Pulmonary:     Effort: Pulmonary effort is normal. No respiratory distress.     Breath sounds: Normal breath sounds.  Abdominal:     General: Abdomen is flat. Bowel sounds are normal. There is no distension.     Palpations: Abdomen is soft. There is no mass.     Tenderness: There is no abdominal tenderness.  Musculoskeletal:        General: Normal range of motion.     Cervical back: Normal range of motion.     Right lower leg: No edema.     Left lower leg: No edema.  Lymphadenopathy:     Cervical: No cervical adenopathy.  Skin:    General: Skin is warm and dry.  Neurological:     General: No focal deficit present.     Mental Status: She is alert and oriented to person, place, and time. Mental status is at baseline.     Motor: No weakness.     Gait: Gait normal.  Psychiatric:        Mood and Affect: Mood normal.        Behavior: Behavior normal.        Thought Content: Thought content normal.        Judgment: Judgment normal.      No results found for any visits on 07/21/24. Last CBC Lab Results  Component Value Date   WBC 4.5 05/21/2023   HGB 13.9 05/21/2023   HCT 42.0 05/21/2023   MCV 94.3 05/21/2023   MCH 31.2 01/16/2023   RDW 13.2 05/21/2023   PLT 181.0 05/21/2023   Last metabolic panel Lab Results  Component Value Date   GLUCOSE 100 (H) 04/21/2024   NA 138 04/21/2024   K 4.5 04/21/2024   CL 102 04/21/2024   CO2 30 04/21/2024   BUN 17 04/21/2024   CREATININE 0.99 04/21/2024   GFR 58.87 (L) 04/21/2024   CALCIUM  9.0 04/21/2024   PROT 7.1 04/21/2024   ALBUMIN 4.4 04/21/2024   LABGLOB 2.2 01/16/2023   AGRATIO 2.0 01/16/2023   BILITOT 0.6 04/21/2024   ALKPHOS 43 04/21/2024   AST 16 04/21/2024   ALT 20  04/21/2024   ANIONGAP 7 11/08/2022   Last lipids Lab Results  Component Value Date   CHOL 196 04/21/2024   HDL 64.00 04/21/2024   LDLCALC 107 (H) 04/21/2024   TRIG 128.0 04/21/2024   CHOLHDL 3 04/21/2024   Last hemoglobin A1c Lab Results  Component Value Date   HGBA1C 5.9 (H) 01/16/2023   Last thyroid  functions Lab Results  Component  Value Date   TSH 2.13 01/28/2024   T3TOTAL 88 06/05/2022   FREET4 1.14 06/05/2022   Last vitamin D  Lab Results  Component Value Date   VD25OH 53.04 05/21/2023   Last vitamin B12 and Folate Lab Results  Component Value Date   VITAMINB12 527 01/28/2024   FOLATE >20.0 12/18/2021        Assessment & Plan:    Routine Health Maintenance and Physical Exam  Immunization History  Administered Date(s) Administered   Fluad Trivalent(High Dose 65+) 08/13/2023   Influenza,inj,Quad PF,6+ Mos 06/09/2019, 06/08/2020, 05/17/2021, 09/25/2022   Moderna Sars-Covid-2 Vaccination 11/20/2019, 12/22/2019, 08/11/2020   PNEUMOCOCCAL CONJUGATE-20 05/30/2021   Tdap 06/14/2014   Zoster Recombinant(Shingrix ) 06/26/2019, 09/01/2019    Health Maintenance  Topic Date Due   Influenza Vaccine  04/03/2024   COVID-19 Vaccine (4 - 2025-26 season) 05/04/2024   DTaP/Tdap/Td (2 - Td or Tdap) 06/14/2024   Medicare Annual Wellness (AWV)  12/25/2024   Lung Cancer Screening  05/28/2025   Colonoscopy  07/23/2029   Pneumococcal Vaccine: 50+ Years  Completed   DEXA SCAN  Completed   Hepatitis C Screening  Completed   Zoster Vaccines- Shingrix   Completed   Meningococcal B Vaccine  Aged Out   Mammogram  Discontinued    Discussed health benefits of physical activity, and encouraged her to engage in regular exercise appropriate for her age and condition.  Annual physical exam  Function kidney decreased -     Comprehensive metabolic panel with GFR  Seasonal allergies -     Cetirizine  HCl; Take 1 tablet (10 mg total) by mouth daily.  Dispense: 100 tablet; Refill:  0  Primary insomnia -     DRUG MONITOR, PANEL 1, SCREEN, URINE  -Physical exam completed.  -Ordered lab to assess kidney function. Office will call with lab results and will be available via MyChart. Will obtain all your labs at your 3 month chronic management, please be fasting.  -Continue all medications. Refilled Cetirizine .  -Ordered urine drug screen for management of Temazepam  for insomnia.  -Reviewed health maintenance: not had covid booster or Tdap; holding on influenza vaccine.  Awaiting a liver biopsy on 11/21.  Return in about 3 months (around 10/21/2024) for chronic management.     Jamielyn Petrucci, NP

## 2024-07-21 NOTE — Patient Instructions (Signed)
-  It was good to see you today.  -Physical exam completed.  -Ordered lab to assess kidney function. Office will call with lab results and will be available via MyChart. Will obtain all your labs at your 3 month chronic management, please be fasting.  -Continue all medications. Refilled Cetirizine .  -Ordered urine drug screen for management of Temazepam  for insomnia.  -Follow up in 3 months.

## 2024-07-22 LAB — DRUG MONITOR, PANEL 1, SCREEN, URINE
Amphetamines: NEGATIVE ng/mL (ref <500)
Barbiturates: NEGATIVE ng/mL (ref <300)
Benzodiazepines: POSITIVE ng/mL — AB (ref <100)
Cocaine Metabolite: NEGATIVE ng/mL (ref <150)
Creatinine: 113.7 mg/dL (ref >=20.0)
Marijuana Metabolite: NEGATIVE ng/mL (ref <20)
Methadone Metabolite: NEGATIVE ng/mL (ref <100)
Opiates: NEGATIVE ng/mL (ref <100)
Oxidant: NEGATIVE ug/mL (ref <200)
Oxycodone: NEGATIVE ng/mL (ref <100)
Phencyclidine: NEGATIVE ng/mL (ref <25)
pH: 7.5 (ref 4.5–9.0)

## 2024-07-22 LAB — DM TEMPLATE

## 2024-07-23 ENCOUNTER — Encounter: Payer: Self-pay | Admitting: Hematology and Oncology

## 2024-07-23 ENCOUNTER — Other Ambulatory Visit: Payer: Self-pay | Admitting: Physician Assistant

## 2024-07-23 DIAGNOSIS — Z01818 Encounter for other preprocedural examination: Secondary | ICD-10-CM

## 2024-07-23 NOTE — H&P (Signed)
 Chief Complaint: Left breast cancer, liver lesion concerning for metastasis; referred for image guided liver lesion biopsy for further evaluation  Referring Provider(s): Gudena,V  Supervising Physician: Jenna Hacker  Patient Status: [facility] - Out-pt  History of Present Illness: Dawn Booker is a 68 y.o. female ex smoker  with past medical history significant for ADD, anxiety, left breast cancer 2019 with prior mastectomies, depression, GERD, hiatal hernia, hyperlipidemia, osteoporosis who presents now with recent findings of segment 8 liver lesion with hypermetabolic porta hepatis lymph nodes on PET scan.  She is scheduled today for image guided liver lesion biopsy for further evaluation.   Patient is Full Code  Past Medical History:  Diagnosis Date   ADD (attention deficit disorder)    Anxiety    breast ca 2019   Left breast cancer   Depression    GERD (gastroesophageal reflux disease)    Hiatal hernia    History of radiation therapy 08/13/2018- 09/25/2018   Left Chest wall and IM nodes/ 50 Gy in 25 fractions, Left supraclaicular fossa and PAB/ 50 Gy in 25 fractions, Left chest wall scar boost/ 10 Gy in 5 fractions.    Hyperlipemia, mixed    Intermittent palpitations    Malignant neoplasm of overlapping sites of left breast in female, estrogen receptor positive (HCC) 05/06/2018   oncologist-  dr odean--  Invasive Lobular Cancer, CIS, Stage IB, Grade 2 (pT3,pN1a,cM0), ER+---  s/p  left mastectomy with sln dissections and right mastectomy (benign)   Osteoporosis    PONV (postoperative nausea and vomiting)    Right thyroid  nodule    Shingles 12/2017   Tennis elbow     Past Surgical History:  Procedure Laterality Date   AXILLARY LYMPH NODE DISSECTION Left 07/09/2018   Procedure: COMPLETION OF LEFT AXILLARY LYMPH NODE DISSECTION;  Surgeon: Curvin Mt III, MD;  Location: [facility] ORS;  Service: General;  Laterality: Left;   BREAST EXCISIONAL BIOPSY Left 1995   benign    CARDIOVASCULAR STRESS TEST  06/21/2017   normal nuclear stress study w/ no ischemia/  normal LV function and wall function, nuclear stress ef 70%   ELBOW SURGERY Left child   MASTECTOMY W/ SENTINEL NODE BIOPSY Left 06/13/2018   MASTECTOMY WITH RADIOACTIVE SEED GUIDED EXCISION AND AXILLARY SENTINEL LYMPH NODE BIOPSY Bilateral 06/13/2018   Procedure: LEFT MASTECTOMY WITH SENTINEL LYMPH NODE MAPPING AND TARGETED NODE DISSECTION AND RIGHT PROPHYLATIC MASTECTOMY;  Surgeon: Curvin Mt MOULD, MD;  Location: [facility] OR;  Service: General;  Laterality: Bilateral;   THYROIDECTOMY Left 10-31-2001   dr gerkin @[facility]    TRANSTHORACIC ECHOCARDIOGRAM  06/21/2017   ef 60-65%/  trivial MR (no evidence mvp)   TUBAL LIGATION  yrs ago   VAGINAL HYSTERECTOMY  1990s    Allergies: Lamictal [lamotrigine], Cymbalta  [duloxetine  hcl], Hydrocodone , Mobic [meloxicam], Sulfa antibiotics, Corticosteroids, Morphine, Other, Prednisone, Prozac  [fluoxetine  hcl], Vortioxetine, and Zoloft [sertraline]  Medications: Prior to Admission medications   Medication Sig Start Date End Date Taking? Authorizing Provider  acetaminophen  (TYLENOL ) 500 MG tablet Take 500 mg by mouth at bedtime.    [provider]  ALPRAZolam  (XANAX ) 0.5 MG tablet Take 2 tablets (1 mg total) by mouth as needed for anxiety. Please take 2 tablets 1 hour before liver biopsy 07/08/24   Odean Potts, MD  atorvastatin  (LIPITOR) 10 MG tablet Take 1 tablet by mouth once daily 05/06/24   Williamson, Joanna R, NP  buPROPion  (WELLBUTRIN  XL) 300 MG 24 hr tablet Take 1 tablet (300 mg total) by mouth  daily. 02/28/24 09/15/24  [name], NP  cetirizine  (ZYRTEC ) 10 MG tablet Take 1 tablet (10 mg total) by mouth daily. 07/21/24   [name], NP  Cholecalciferol (VITAMIN D ) 50 MCG (2000 UT) CAPS Take by mouth. 1 capsule daily    [provider]  Coenzyme Q10 (COQ10) 100 MG CAPS Take 1 capsule by mouth daily.    [provider]   Cyanocobalamin  (VITAMIN B12) 500 MCG TABS Take by mouth. 1 tablet by mouth daily    [provider]  folic acid  (FOLVITE ) 400 MCG tablet Take 400 mcg by mouth daily.    [provider]  letrozole  (FEMARA ) 2.5 MG tablet Take 1 tablet (2.5 mg total) by mouth daily. 07/08/24   Gudena, Vinay, MD  MAGNESIUM PO Take by mouth.    [provider]  omeprazole  (PRILOSEC) 40 MG capsule Take 1 capsule by mouth once daily 07/02/24   Williamson, Joanna R, NP  OVER THE COUNTER MEDICATION 1 tablet daily. Vegan Calcium  1500    [provider]  temazepam  (RESTORIL ) 30 MG capsule TAKE 1 CAPSULE BY MOUTH ONCE DAILY AT NIGHT AT BEDTIME 06/01/24   [name], NP     Family History  Problem Relation Age of Onset   Hypertension Mother    Heart attack Mother    Breast cancer Mother    Bipolar disorder Father        Sucide   Other Sister        Passed at birth   Breast cancer Maternal Grandmother    Breast cancer Cousin    Emphysema Half-Sister    Alcohol abuse Half-Brother    Stroke Half-Brother     Social History   Socioeconomic History   Marital status: Married    Spouse name: Not on file   Number of children: 2   Years of education: 12   Highest education level: 12th grade  Occupational History   Not on file  Tobacco Use   Smoking status: Former    Current packs/day: 0.50    Average packs/day: 0.5 packs/day for 51.9 years (25.9 ttl pk-yrs)    Types: Cigarettes    Start date: 1974   Smokeless tobacco: Never  Vaping Use   Vaping status: Never Used  Substance and Sexual Activity   Alcohol use: Not Currently   Drug use: No   Sexual activity: Yes    Partners: Male    Birth control/protection: None  Other Topics Concern   Not on file  Social History Narrative   Lives at home with husband.   Social Drivers of Corporate Investment Banker Strain: Low Risk  (04/14/2024)   Overall Financial Resource Strain (CARDIA)    Difficulty of Paying Living  Expenses: Not hard at all  Food Insecurity: No Food Insecurity (04/14/2024)   Hunger Vital Sign    Worried About Running Out of Food in the Last Year: Never true    Ran Out of Food in the Last Year: Never true  Transportation Needs: No Transportation Needs (04/14/2024)   PRAPARE - Administrator, Civil Service (Medical): No    Lack of Transportation (Non-Medical): No  Physical Activity: Inactive (04/14/2024)   Exercise Vital Sign    Days of Exercise per Week: 0 days    Minutes of Exercise per Session: Not on file  Stress: No Stress Concern Present (04/14/2024)   [employer] of Occupational Health - Occupational Stress Questionnaire    Feeling of  Stress: Only a little  Social Connections: Unknown (04/14/2024)   Social Connection and Isolation Panel    Frequency of Communication with Friends and Family: Twice a week    Frequency of Social Gatherings with Friends and Family: Patient declined    Attends Religious Services: Patient declined    Database Administrator or Organizations: Patient declined    Attends Engineer, Structural: Not on file    Marital Status: Married       Review of Systems; denies fever,HA,CP,dyspnea, abd/back pain,N/V or bleeding; she does have occ cough  Vital Signs: temp 98.3  BP 140/88  HR 79 R 16  O2 SATS 98% RA    Advance Care Plan: No documents on file   Physical Exam: awake/alert; chest- CTA bilat; heart-RRR; abd-soft,+BS,NT; no LE edema  Imaging: NM PET Image Initial (PI) Skull Base To Thigh (F-18 FDG) Result Date: 07/06/2024 EXAM: PET AND CT SKULL BASE TO MID THIGH 07/03/2024 10:53:24 AM TECHNIQUE: RADIOPHARMACEUTICAL: 7.8 mCi F-18 FDG Uptake time 60 minutes. Glucose level 100.  mg/dl. PET imaging was acquired from the base of the skull to the mid thighs. Non-contrast enhanced computed tomography was obtained for attenuation correction and anatomic localization. COMPARISON: MRI of 06/17/2024. CT of the chest, abdomen, and  pelvis of 05/28/2024. Most recent PET of 10/28/2018. CLINICAL HISTORY: Breast cancer, invasive, stage IV, initial workup; Liver lesion. FINDINGS: HEAD AND NECK: No metabolically active cervical lymphadenopathy. No cervical adenopathy. Left-sided thyroidectomy. CHEST: No metabolically active pulmonary nodules. No metabolically active lymphadenopathy. Left axillary node dissection. Bilateral mastectomy. Minimal anterior left upper lobe radiation fibrosis. ABDOMEN AND PELVIS: Hypermetabolic segment 8 lesion including at 1.4 cm and SUV 7.6 on image 91/4. Hypermetabolic porta hepatis nodes, including an index node of 1.4 cm and SUV 5.8 on image 101/4. Abdominal pelvic ct findings deferred to recent CT; no superimposed acute process. Abdominal aortic atherosclerosis. Hysterectomy. Physiologic activity within the gastrointestinal and genitourinary systems. BONES AND SOFT TISSUE: No abnormal FDG activity localizes to the bones. No metabolically active aggressive osseous lesion. IMPRESSION: 1. Isolated segment 8 hepatic metastasis. 2. Hypermetabolic porta hepatis nodes, highly suspicious for metastatic disease. 3. No extraabdominal metastatic disease identified. 4. Aortic atherosclerosis ([id number]). Electronically signed by: Rockey Kilts MD 07/06/2024 03:49 PM EST RP Workstation: [id number]    Labs:  CBC: No results for input(s): WBC, HGB, HCT, PLT in the last 8760 hours.  COAGS: No results for input(s): INR, APTT in the last 8760 hours.  BMP: Recent Labs    04/21/24 1051 07/21/24 1204  NA 138 138  K 4.5 3.9  CL 102 101  CO2 30 29  GLUCOSE 100* 97  BUN 17 14  CALCIUM  9.0 9.4  CREATININE 0.99 0.80    LIVER FUNCTION TESTS: Recent Labs    04/21/24 1051 07/21/24 1204  BILITOT 0.6 0.6  AST 16 15  ALT 20 20  ALKPHOS 43 52  PROT 7.1 7.1  ALBUMIN 4.4 4.6    TUMOR MARKERS: No results for input(s): AFPTM, CEA, CA199, CHROMGRNA in the last 8760 hours.  Assessment and  Plan: 68 y.o. female ex smoker with past medical history significant for ADD, anxiety, left breast cancer 2019 with prior mastectomies, depression, GERD, hiatal hernia, hyperlipidemia, osteoporosis who presents now with recent findings of segment 8 liver lesion with hypermetabolic porta hepatis lymph nodes on PET scan.  She is scheduled today for image guided liver lesion biopsy for further evaluation.Risks and benefits of procedure was discussed with the patient /spouse  including,  but not limited to bleeding, infection, damage to adjacent structures or low yield requiring additional tests.  All of the questions were answered and there is agreement to proceed.  Consent signed and in chart.  LABS PENDING  Thank you for allowing our service to participate in Dawn Booker 's care.  Electronically Signed: D. Franky Rakers, PA-C   07/23/2024, 3:47 PM      I spent a total of  25 minutes   in face to face in clinical consultation, greater than 50% of which was counseling/coordinating care for image guided liver lesion biopsy

## 2024-07-24 ENCOUNTER — Encounter (HOSPITAL_COMMUNITY): Payer: Self-pay

## 2024-07-24 ENCOUNTER — Other Ambulatory Visit: Payer: Self-pay

## 2024-07-24 ENCOUNTER — Ambulatory Visit (HOSPITAL_COMMUNITY)
Admission: RE | Admit: 2024-07-24 | Discharge: 2024-07-24 | Disposition: A | Discharge status: Home / Self Care | Source: Ambulatory Visit | Attending: Hematology and Oncology | Admitting: Hematology and Oncology

## 2024-07-24 DIAGNOSIS — C787 Secondary malignant neoplasm of liver and intrahepatic bile duct: Secondary | ICD-10-CM | POA: Diagnosis present

## 2024-07-24 DIAGNOSIS — Z170 Estrogen receptor positive status [ER+]: Secondary | ICD-10-CM | POA: Insufficient documentation

## 2024-07-24 DIAGNOSIS — C50911 Malignant neoplasm of unspecified site of right female breast: Secondary | ICD-10-CM | POA: Insufficient documentation

## 2024-07-24 DIAGNOSIS — Z87891 Personal history of nicotine dependence: Secondary | ICD-10-CM | POA: Insufficient documentation

## 2024-07-24 DIAGNOSIS — Z01818 Encounter for other preprocedural examination: Secondary | ICD-10-CM

## 2024-07-24 DIAGNOSIS — C50812 Malignant neoplasm of overlapping sites of left female breast: Secondary | ICD-10-CM | POA: Insufficient documentation

## 2024-07-24 HISTORY — PX: IR US LIVER BIOPSY: IMG936

## 2024-07-24 LAB — CBC
HCT: 41.5 % (ref 36.0–46.0)
Hemoglobin: 14.2 g/dL (ref 12.0–15.0)
MCH: 32.1 pg (ref 26.0–34.0)
MCHC: 34.2 g/dL (ref 30.0–36.0)
MCV: 93.9 fL (ref 80.0–100.0)
Platelets: 169 K/uL (ref 150–400)
RBC: 4.42 MIL/uL (ref 3.87–5.11)
RDW: 12.6 % (ref 11.5–15.5)
WBC: 3.8 K/uL — ABNORMAL LOW (ref 4.0–10.5)
nRBC: 0 % (ref 0.0–0.2)

## 2024-07-24 LAB — PROTIME-INR
INR: 0.9 (ref 0.8–1.2)
Prothrombin Time: 12.8 s (ref 11.4–15.2)

## 2024-07-24 MED ORDER — GELATIN ABSORBABLE 12-7 MM EX MISC
CUTANEOUS | Status: AC
  Filled 2024-07-24: qty 1

## 2024-07-24 MED ORDER — FENTANYL CITRATE (PF) 100 MCG/2ML IJ SOLN
INTRAMUSCULAR | Status: AC
  Filled 2024-07-24: qty 2

## 2024-07-24 MED ORDER — MIDAZOLAM HCL 2 MG/2ML IJ SOLN
INTRAMUSCULAR | Status: AC
  Filled 2024-07-24: qty 2

## 2024-07-24 MED ORDER — MIDAZOLAM HCL (PF) 2 MG/2ML IJ SOLN
INTRAMUSCULAR | Status: AC | PRN
Start: 2024-07-24 — End: 2024-07-24
  Administered 2024-07-24: 1 mg via INTRAVENOUS

## 2024-07-24 MED ORDER — KETOROLAC TROMETHAMINE 30 MG/ML IJ SOLN
INTRAMUSCULAR | Status: AC
  Filled 2024-07-24: qty 1

## 2024-07-24 MED ORDER — FENTANYL CITRATE (PF) 100 MCG/2ML IJ SOLN
INTRAMUSCULAR | Status: AC | PRN
Start: 2024-07-24 — End: 2024-07-24
  Administered 2024-07-24: 50 ug via INTRAVENOUS

## 2024-07-24 MED ORDER — LIDOCAINE-EPINEPHRINE 1 %-1:100000 IJ SOLN
INTRAMUSCULAR | Status: AC
  Filled 2024-07-24: qty 1

## 2024-07-24 MED ORDER — FENTANYL CITRATE (PF) 100 MCG/2ML IJ SOLN
INTRAMUSCULAR | Status: AC | PRN
  Administered 2024-07-24: 50 ug via INTRAVENOUS

## 2024-07-24 MED ORDER — LIDOCAINE-EPINEPHRINE 1 %-1:100000 IJ SOLN
20.0000 mL | Freq: Once | INTRAMUSCULAR | Status: AC
  Administered 2024-07-24: 5 mL via INTRADERMAL

## 2024-07-24 MED ORDER — ACETAMINOPHEN 325 MG PO TABS: 650.0000 mg | ORAL_TABLET | Freq: Four times a day (QID) | ORAL | Status: DC | PRN

## 2024-07-24 MED ORDER — SODIUM CHLORIDE 0.9 % IV SOLN: INTRAVENOUS | Status: DC

## 2024-07-24 MED ORDER — KETOROLAC TROMETHAMINE 30 MG/ML IJ SOLN
30.0000 mg | Freq: Once | INTRAMUSCULAR | Status: AC
  Administered 2024-07-24: 30 mg via INTRAVENOUS

## 2024-07-24 NOTE — Discharge Instructions (Addendum)
 Discharge Instructions:   Please call Interventional Radiology clinic [phone] with any questions or concerns.  You may remove your dressing and shower tomorrow.      Moderate Conscious Sedation, Adult, Care After  This sheet gives you information about how to care for yourself after your procedure. Your health care provider may also give you more specific instructions. If you have problems or questions, contact your health care provider. What can I expect after the procedure? After the procedure, it is common to have: Sleepiness for several hours. Impaired judgment for several hours. Difficulty with balance. Vomiting if you eat too soon. Follow these instructions at home: For the time period you were told by your health care provider: Rest. Do not participate in activities where you could fall or become injured. Do not drive or use machinery. Do not drink alcohol. Do not take sleeping pills or medicines that cause drowsiness. Do not make important decisions or sign legal documents. Do not take care of children on your own. Eating and drinking  Follow the diet recommended by your health care provider. Drink enough fluid to keep your urine pale yellow. If you vomit: Drink water, juice, or soup when you can drink without vomiting. Make sure you have little or no nausea before eating solid foods. General instructions Take over-the-counter and prescription medicines only as told by your health care provider. Have a responsible adult stay with you for the time you are told. It is important to have someone help care for you until you are awake and alert. Do not smoke. Keep all follow-up visits as told by your health care provider. This is important. Contact a health care provider if: You are still sleepy or having trouble with balance after 24 hours. You feel light-headed. You keep feeling nauseous or you keep vomiting. You develop a rash. You have a fever. You have redness  or swelling around the IV site. Get help right away if: You have trouble breathing. You have new-onset confusion at home. Summary After the procedure, it is common to feel sleepy, have impaired judgment, or feel nauseous if you eat too soon. Rest after you get home. Know the things you should not do after the procedure. Follow the diet recommended by your health care provider and drink enough fluid to keep your urine pale yellow. Get help right away if you have trouble breathing or new-onset confusion at home. This information is not intended to replace advice given to you by your health care provider. Make sure you discuss any questions you have with your health care provider. Document Revised: 12/18/2019 Document Reviewed: 07/16/2019 Elsevier Patient Education  2023 [employer] Inc[employer] Discharge Instructions:  Liver Biopsy, Care After  The following information offers guidance on how to care for yourself after your procedure. Your health care provider may also give you more specific instructions. If you have problems or questions, contact your health care provider. What can I expect after the procedure? After your procedure, it is common to: Have pain and soreness in the area where the biopsy was done. Have bruising around the area where the biopsy was done. Feel tired for 1-2 days. Follow these instructions at home: Medicines Take over-the-counter and prescription medicines only as told by your health care provider. If you were prescribed an antibiotic medicine, take it as told by your health care provider. Do not stop taking the antibiotic, even if you start to feel better. Do not take medicines, such as aspirin and ibuprofen , unless your doctor  tells you to take them. Ask your health care provider if the medicine prescribed to you: Requires you to avoid driving or using machinery. Can cause constipation. You may need to take these actions to prevent or treat constipation: Drink enough  fluid to keep your urine pale yellow. Take over-the-counter or prescription medicines. Eat foods that are high in fiber, such as beans, whole grains, and fresh fruits and vegetables. Limit foods that are high in fat and processed sugars, such as fried or sweet foods. Incision care Follow instructions from your health care provider about how to take care of your incisions. Make sure you: Wash your hands with soap and water for at least 20 seconds before and after you change your bandage (dressing). If soap and water are not available, use hand sanitizer. Change your dressing as told by your health care provider. Leave stitches (sutures), skin glue, or adhesive strips in place. These skin closures may need to stay in place for 2 weeks or longer. If adhesive strip edges start to loosen and curl up, you may trim the loose edges. Do not remove adhesive strips completely unless your health care provider tells you to do that. Check your incision areas every day for signs of infection. Check for: Redness, swelling, or more pain. Fluid or blood. Warmth. Pus or a bad smell. Do not take baths, swim, or use a hot tub until your health care provider says it is okay to do so. Activity Rest at home for 1-2 days, or as directed by your health care provider. Avoid sitting for a long time without moving. Get up to take short walks every 1-2 hours. This is important to improve blood flow and breathing. Ask for help if you feel weak or unsteady. Do not lift anything that is heavier than 10 lb (4.5 kg), or the limit that your health care provider tells you, until he or she says that it is safe. Do not play contact sports for 2 weeks after the procedure. Return to your normal activities as told by your health care provider. Ask your health care provider what activities are safe for you. General instructions  Do not drink alcohol in the first week after the procedure. Plan to have a responsible adult care for you  for the time you are told after you leave the hospital or clinic. This is important. It is up to you to get the results of your procedure. Ask your health care provider, or the department that is doing the procedure, when your results will be ready. Keep all follow-up visits. This is important. Contact a health care provider if: You have increased bleeding from an incision. You have redness, swelling, or increasing pain in any incisions. You notice a discharge or a bad smell coming from any of your incisions. You develop a rash. You have a fever or chills. Get help right away if: You develop swelling, bloating, or pain in your abdomen. You become dizzy or faint. You have nausea or vomiting. You have shortness of breath or difficulty breathing. You develop chest pain. You have problems with your speech or vision. You have trouble with your balance or moving your arms or legs. These symptoms may represent a serious problem that is an emergency. Do not wait to see if the symptoms will go away. Get medical help right away. Call your local emergency services (911 in the [place] Do not drive yourself to the hospital. Summary After the liver biopsy, it is common to have  pain, soreness, and bruising in the area, as well as tiredness (fatigue). Take over-the-counter and prescription medicines only as told by your health care provider. Follow instructions from your health care provider about how to care for your incisions. Check your incision areas daily for signs of infection. This information is not intended to replace advice given to you by your health care provider. Make sure you discuss any questions you have with your health care provider. Document Revised: 07/04/2020 Document Reviewed: 07/04/2020 Elsevier Patient Education  2023 [employer].

## 2024-07-24 NOTE — Sedation Documentation (Signed)
 RN Nigel Wessman pulled 2 mg Versed  and 100 mcg Fentanyl  in IR med room. Pt. Received 1 mg Versed  and 100 mcg Fentanyl  throughout the procedure. RN Maela Takeda wasted 1 mg Versed  with Rolan Cohens RN.

## 2024-07-24 NOTE — Procedures (Signed)
 Pre procedural Dx: Breast Ca with new liver lesion  Post procedural Dx: Same  Technically successful US  guided liver lesion bx.  Initially seen,but under rib making procedure challenging.   EBL: None.   Complications: None immediate.   Dawn Banner, MD Pager #: [phone]

## 2024-07-28 LAB — SURGICAL PATHOLOGY

## 2024-08-05 ENCOUNTER — Other Ambulatory Visit (HOSPITAL_COMMUNITY): Payer: Self-pay

## 2024-08-05 ENCOUNTER — Telehealth: Payer: Self-pay | Admitting: Pharmacist

## 2024-08-05 ENCOUNTER — Inpatient Hospital Stay: Attending: Hematology and Oncology | Admitting: Hematology and Oncology

## 2024-08-05 ENCOUNTER — Telehealth: Payer: Self-pay

## 2024-08-05 VITALS — BP 140/90 | HR 98 | Temp 98.4°F | Resp 18 | Ht 65.0 in | Wt 161.0 lb

## 2024-08-05 DIAGNOSIS — Z9013 Acquired absence of bilateral breasts and nipples: Secondary | ICD-10-CM | POA: Diagnosis not present

## 2024-08-05 DIAGNOSIS — Z882 Allergy status to sulfonamides status: Secondary | ICD-10-CM | POA: Insufficient documentation

## 2024-08-05 DIAGNOSIS — R232 Flushing: Secondary | ICD-10-CM | POA: Insufficient documentation

## 2024-08-05 DIAGNOSIS — Z888 Allergy status to other drugs, medicaments and biological substances status: Secondary | ICD-10-CM | POA: Diagnosis not present

## 2024-08-05 DIAGNOSIS — C50812 Malignant neoplasm of overlapping sites of left female breast: Secondary | ICD-10-CM | POA: Diagnosis not present

## 2024-08-05 DIAGNOSIS — M810 Age-related osteoporosis without current pathological fracture: Secondary | ICD-10-CM | POA: Diagnosis not present

## 2024-08-05 DIAGNOSIS — Z79899 Other long term (current) drug therapy: Secondary | ICD-10-CM | POA: Insufficient documentation

## 2024-08-05 DIAGNOSIS — Z170 Estrogen receptor positive status [ER+]: Secondary | ICD-10-CM | POA: Diagnosis not present

## 2024-08-05 DIAGNOSIS — Z885 Allergy status to narcotic agent status: Secondary | ICD-10-CM | POA: Diagnosis not present

## 2024-08-05 DIAGNOSIS — D72819 Decreased white blood cell count, unspecified: Secondary | ICD-10-CM | POA: Insufficient documentation

## 2024-08-05 DIAGNOSIS — R5383 Other fatigue: Secondary | ICD-10-CM | POA: Insufficient documentation

## 2024-08-05 DIAGNOSIS — Z17411 Hormone receptor positive with human epidermal growth factor receptor 2 negative status: Secondary | ICD-10-CM | POA: Diagnosis not present

## 2024-08-05 DIAGNOSIS — Z79811 Long term (current) use of aromatase inhibitors: Secondary | ICD-10-CM | POA: Insufficient documentation

## 2024-08-05 DIAGNOSIS — Z23 Encounter for immunization: Secondary | ICD-10-CM | POA: Insufficient documentation

## 2024-08-05 DIAGNOSIS — Z1721 Progesterone receptor positive status: Secondary | ICD-10-CM | POA: Diagnosis not present

## 2024-08-05 MED ORDER — ABEMACICLIB 50 MG PO TABS: 50.0000 mg | ORAL_TABLET | Freq: Two times a day (BID) | ORAL | 3 refills | Status: AC

## 2024-08-05 NOTE — Telephone Encounter (Signed)
 Oral Oncology Patient Advocate Encounter  Prior Authorization for [employer] has been approved.    PA# [id number] Effective dates: 08/05/2024 through 08/05/2025  Patients co-pay is $1,457.89  I  will follow up to  see if copay  assistance is needed     Charlott Hamilton,  CPhT-Adv  she/her/hers [place]  [facility] Pharmacy Technician Patient Advocate Specialist III [facility] Phone: [phone]  Fax: [phone] [email or web address]

## 2024-08-05 NOTE — Progress Notes (Signed)
 Patient Care Team: [name], NP as PCP - General (Family Medicine) Odean Potts, MD as Consulting Physician (Hematology and Oncology) Curvin Deward MOULD, MD as Consulting Physician (General Surgery) Causey, Morna Pickle, NP as Nurse Practitioner (Hematology and Oncology) Kristie Lamprey, MD as Consulting Physician (Gastroenterology) Rutherford Gain, MD as Consulting Physician (Obstetrics and Gynecology)  DIAGNOSIS:  Encounter Diagnosis  Name Primary?   Malignant neoplasm of overlapping sites of left breast in female, estrogen receptor positive (HCC) Yes    SUMMARY OF ONCOLOGIC HISTORY: Oncology History  Malignant neoplasm of overlapping sites of left breast in female, estrogen receptor positive (HCC)  07/2015 Miscellaneous   Genetics negative.  Genes tested: APC, ATM, BARD1, BMPR1A, BRCA1, BRCA2, BRIP1, CDH1, CDK4, CDKN2A, CHEK2, EPCAM, GREM1, MLH1, MSH2, MSH6, MUTYH, PALB2, PMS2, POLD1, POLE, PTEN, RAD51C, RAD51D, RET, SMAD4, STK11, and TP53.      05/06/2018 Initial Diagnosis   Screening detected left breast mass anteriorly 1030 to 11 o'clock position 2 masses 6 mm and 7 mm, 12:30 position 2.6 cm both of these masses biopsy-proven grade 2 invasive lobular cancer ER 100%, PR 10%, Ki-67 15%, HER-2 negative 1+ by IHC, lymph node positive for malignancy T2N1 stage IIa clinical stage   05/21/2018 Cancer Staging   Staging form: Breast, AJCC 8th Edition - Pathologic: Stage IB (pT3, pN1a, cM0, G2, ER+, PR+, HER2-) - Signed by Odean Potts, MD on 06/24/2018   06/13/2018 Surgery   Bilateral mastectomies: Left: ILC, grade 2, 5.4 cm, LCIS, margins negative, lymphovascular invasion present, 2/7 lymph nodes positive with extranodal extension (1 additional lymph node had isolated tumor cells), right mastectomy benign; T3N1a ER 100%, PR 10%, HER-2 -1+, Ki-67 10 to 15%, stage Ib   08/06/2018 - 09/25/2018 Radiation Therapy   Adjuvant XRT   10/09/2018 -  Anti-estrogen oral therapy    Anastrozole  1mg  daily, plan for 7 years; switched to tamoxifen  11/07/18 for bone health; opted to forego all antiestrogen therapy     CHIEF COMPLIANT: Follow-up to discuss treatment plan  HISTORY OF PRESENT ILLNESS:   History of Present Illness Dawn Booker is a 68 year old female with metastatic breast cancer who presents for discussion of treatment options. She is accompanied by her daughter.  She takes letrozole  in the early afternoon to avoid insomnia. She reports sudden severe fatigue and bothersome hot flashes, which she attributes to letrozole .  She is considering starting Verzenio but is concerned about diarrhea and other side effects. She is worried about bone health on letrozole . She receives Prolia  once a year and is asking about increasing the frequency.  She has chronic low white blood cell counts, with a recent WBC of 3.8, and is concerned about platelets and liver function with treatment. She has regular blood work for monitoring. She strongly prioritizes maintaining quality of life and wishes to avoid treatment that would leave her bedridden or unable to participate in family activities, especially during the holidays.     ALLERGIES:  is allergic to lamictal [lamotrigine], cymbalta  [duloxetine  hcl], hydrocodone , mobic [meloxicam], sulfa antibiotics, corticosteroids, morphine, other, prednisone, prozac  [fluoxetine  hcl], vortioxetine, and zoloft [sertraline].  MEDICATIONS:  Current Outpatient Medications  Medication Sig Dispense Refill   acetaminophen  (TYLENOL ) 500 MG tablet Take 500 mg by mouth at bedtime.     ALPRAZolam  (XANAX ) 0.5 MG tablet Take 2 tablets (1 mg total) by mouth as needed for anxiety. Please take 2 tablets 1 hour before liver biopsy 2 tablet 0   atorvastatin  (LIPITOR) 10 MG tablet Take  1 tablet by mouth once daily 100 tablet 0   buPROPion  (WELLBUTRIN  XL) 300 MG 24 hr tablet Take 1 tablet (300 mg total) by mouth daily. 100 tablet 1   cetirizine   (ZYRTEC ) 10 MG tablet Take 1 tablet (10 mg total) by mouth daily. 100 tablet 0   Cholecalciferol (VITAMIN D ) 50 MCG (2000 UT) CAPS Take by mouth. 1 capsule daily     Coenzyme Q10 (COQ10) 100 MG CAPS Take 1 capsule by mouth daily.     Cyanocobalamin  (VITAMIN B12) 500 MCG TABS Take by mouth. 1 tablet by mouth daily     folic acid  (FOLVITE ) 400 MCG tablet Take 400 mcg by mouth daily.     letrozole  (FEMARA ) 2.5 MG tablet Take 1 tablet (2.5 mg total) by mouth daily. 90 tablet 3   MAGNESIUM PO Take by mouth.     omeprazole  (PRILOSEC) 40 MG capsule Take 1 capsule by mouth once daily 100 capsule 0   OVER THE COUNTER MEDICATION 1 tablet daily. Vegan Calcium  1500     temazepam  (RESTORIL ) 30 MG capsule TAKE 1 CAPSULE BY MOUTH ONCE DAILY AT NIGHT AT BEDTIME 100 capsule 0   No current facility-administered medications for this visit.    PHYSICAL EXAMINATION: ECOG PERFORMANCE STATUS: 1 - Symptomatic but completely ambulatory  Vitals:   08/05/24 0847  BP: (!) 140/90  Pulse: 98  Resp: 18  Temp: 98.4 F (36.9 C)  SpO2: 99%   Filed Weights   08/05/24 0847  Weight: 161 lb (73 kg)    Physical Exam   (exam performed in the presence of a chaperone)  LABORATORY DATA:  I have reviewed the data as listed    Latest Ref Rng & Units 07/21/2024   12:04 PM 04/21/2024   10:51 AM 05/21/2023   10:50 AM  CMP  Glucose 70 - 99 mg/dL 97  899  96   BUN 6 - 23 mg/dL 14  17  16    Creatinine 0.40 - 1.20 mg/dL 9.19  9.00  9.17   Sodium 135 - 145 mEq/L 138  138  139   Potassium 3.5 - 5.1 mEq/L 3.9  4.5  3.9   Chloride 96 - 112 mEq/L 101  102  104   CO2 19 - 32 mEq/L 29  30  28    Calcium  8.4 - 10.5 mg/dL 9.4  9.0  9.3   Total Protein 6.0 - 8.3 g/dL 7.1  7.1  6.7   Total Bilirubin 0.2 - 1.2 mg/dL 0.6  0.6  0.5   Alkaline Phos 39 - 117 U/L 52  43  50   AST 0 - 37 U/L 15  16  12    ALT 0 - 35 U/L 20  20  13      Lab Results  Component Value Date   WBC 3.8 (L) 07/24/2024   HGB 14.2 07/24/2024   HCT 41.5  07/24/2024   MCV 93.9 07/24/2024   PLT 169 07/24/2024   NEUTROABS 2.7 05/21/2023    ASSESSMENT & PLAN:  Malignant neoplasm of overlapping sites of left breast in female, estrogen receptor positive (HCC) 06/13/2018:Bilateral mastectomies: Left: ILC, grade 2, 5.4 cm, LCIS, margins negative, lymphovascular invasion present, 2/7 lymph nodes positive with extranodal extension (1 additional lymph node had isolated tumor cells), right mastectomy benign; T3N1a ER 100%, PR 10%, HER-2 -1+, Ki-67 10 to 15%, stage Ib MammaPrint: Low risk, luminal type a Adjuvant radiation therapy: 08/14/2018- MRI back: L4 and L5 small lesions could not rule out metastatic  disease.  PET CT negative for bone metastases.   Treatment: Tamoxifen  20 mg daily Started 11/07/2018-stopped 01/01/2019 due to profound depression/anxiety Couldn't tolerate Anastrozole    10/28/2018 PET CT scan: No evidence of metastatic disease, no hypermetabolism at L4-L5 vertebral bodies to correspond to the MRI findings.   CT abdomen 06/30/2019: No findings to suggest the cause of abdominal discomfort.  No metastatic disease Osteoporosis: T score -3.1   Breast cancer surveillance: 11/10/2022: Ultrasound: No suspicious masses 04/29/2024: Guardant reveal: CT DNA positive less than 0.05% 06/01/2024: CT CAP: New ill-defined lesion anterior right lobe of the liver measuring 1.7 cm highly suspicious for hepatic metastasis.    Liver MRI 06/17/2024: Multiple small cysts both right and left hepatic lobes: Differential diagnosis metastasis and sclerosing hemangioma   PET CT 07/06/24: Isolated liver met, hyper met porta hep LN Guardant360 07/07/2024: PIK3CA mutation   Plan: Liver biopsy: 07/24/2024: Metastatic breast cancer positive for GATA3 and negative for SOX10, ER 100%, PR 0%, HER2 1+ Recommendation: Add Verzinio to letrozole  Concern for osteoporosis T-score -2.9: Patient cannot tolerate Prolia  well.  We discussed about doing Zometa once a year.    Abemaciclib counseling: I discussed at length the risks and benefits of Abemaciclib in combination with letrozole . Adverse effects of Abemaciclib include decreasing neutrophil count, pneumonia, blood clots in lungs as well as nausea and GI symptoms. Side effects of letrozole  include hot flashes, muscle aches and pains, uterine bleeding/spotting/cancer, osteoporosis, risk of blood clots.  Patient is undergoing [facility] next [facility].  Plan to start Verzinio September 03, 2024. Follow-up in 1 week for education with Norleen Follow-up with me 2 weeks after starting Verzenio      No orders of the defined types were placed in this encounter.  The patient has a good understanding of the overall plan. she agrees with it. she will call with any problems that may develop before the next visit here.  I personally spent a total of 45  minutes in the care of the patient today including preparing to see the patient, getting/reviewing separately obtained history, performing a medically appropriate exam/evaluation, counseling and educating, placing orders, referring and communicating with other health care professionals, documenting clinical information in the EHR, independently interpreting results, communicating results, and coordinating care.   Viinay K Zaccheaus Storlie, MD 08/05/24

## 2024-08-05 NOTE — Assessment & Plan Note (Signed)
 06/13/2018:Bilateral mastectomies: Left: ILC, grade 2, 5.4 cm, LCIS, margins negative, lymphovascular invasion present, 2/7 lymph nodes positive with extranodal extension (1 additional lymph node had isolated tumor cells), right mastectomy benign; T3N1a ER 100%, PR 10%, HER-2 -1+, Ki-67 10 to 15%, stage Ib MammaPrint: Low risk, luminal type a Adjuvant radiation therapy: 08/14/2018- MRI back: L4 and L5 small lesions could not rule out metastatic disease.  PET CT negative for bone metastases.   Treatment: Tamoxifen  20 mg daily Started 11/07/2018-stopped 01/01/2019 due to profound depression/anxiety Couldn't tolerate Anastrozole    10/28/2018 PET CT scan: No evidence of metastatic disease, no hypermetabolism at L4-L5 vertebral bodies to correspond to the MRI findings.   CT abdomen 06/30/2019: No findings to suggest the cause of abdominal discomfort.  No metastatic disease Osteoporosis: T score -3.1   Breast cancer surveillance: 11/10/2022: Ultrasound: No suspicious masses 04/29/2024: Guardant reveal: CT DNA positive less than 0.05% 06/01/2024: CT CAP: New ill-defined lesion anterior right lobe of the liver measuring 1.7 cm highly suspicious for hepatic metastasis.    Liver MRI 06/17/2024: Multiple small cysts both right and left hepatic lobes: Differential diagnosis metastasis and sclerosing hemangioma   PET CT 07/06/24: Isolated liver met, hyper met porta hep LN Guardant360 07/07/2024: PIK3CA mutation   Plan: Liver biopsy: 07/24/2024: Metastatic breast cancer positive for GATA3 and negative for SOX10, ER 100%, PR 0%, HER2 1+ Recommendation: Add Verzinio to letrozole  Concern for osteoporosis T-score -2.9: Patient cannot tolerate Prolia  well.  We discussed about doing Zometa once a year.   Abemaciclib  counseling: I discussed at length the risks and benefits of Abemaciclib  in combination with letrozole . Adverse effects of Abemaciclib  include decreasing neutrophil count, pneumonia, blood clots in lungs as  well as nausea and GI symptoms. Side effects of letrozole  include hot flashes, muscle aches and pains, uterine bleeding/spotting/cancer, osteoporosis, risk of blood clots.  Follow-up in 1 week for education with Norleen

## 2024-08-05 NOTE — Telephone Encounter (Signed)
 Oral Oncology Patient Advocate Encounter   Received notification that prior authorization for Verzenio is required.   PA submitted on 08/05/2024 Key [id number] Status is pending      Charlott Hamilton,  CPhT-Adv  she/her/hers [facility]  [facility] Pharmacy Technician Patient Advocate Specialist III [facility] Phone: [phone]  Fax: [phone] [email or web address]

## 2024-08-05 NOTE — Telephone Encounter (Signed)
 [facility]        Telephone: [phone]?Fax: [phone]   Oncology Clinical Pharmacist Practitioner Encounter   Received new prescription for abemaciclib for the treatment of breast cancer. This is being given in combination with letrozole . It is planned to continue until two years in the adjuvant setting per the monarchE trial data.  Labs from 07/21/24 assessed. Prescription dose and frequency assessed.   Current medication list in Epic reviewed. Significant DDIs with abemaciclib identified:No.   Evaluated chart. Patient barriers to medication adherence identified: No.  Patient agreement for treatment documented in physician note on 08/05/24.  Prescription has been e-scribed to the [facility]) for benefits analysis and approval.  Oral Oncology Clinic will continue to follow for insurance authorization, copayment issues, initial counseling and start date.  Jasmaine Rochel A. Lucila, PharmD, BCOP, CPP Hematology-Oncology Clinical Pharmacist Practitioner  08/05/2024 12:31 PM  **Disclaimer: This note was dictated with voice recognition software. Similar sounding words can inadvertently be transcribed and this note may contain transcription errors which may not have been corrected upon publication of note.**

## 2024-08-06 ENCOUNTER — Encounter: Payer: Self-pay | Admitting: Hematology and Oncology

## 2024-08-06 ENCOUNTER — Telehealth: Payer: Self-pay | Admitting: Hematology and Oncology

## 2024-08-06 NOTE — Telephone Encounter (Signed)
 I left voicemail for patient to return my call to discuss upcoming appointments.

## 2024-08-06 NOTE — Telephone Encounter (Signed)
 Oral Oncology Patient Advocate Encounter   Patient has requested copay assistance. At this time no grant funding is available. Patient has decided to apply for the PAP through [facility] for Verzenio. I have sent the application to the patient via Docus[employer].   Status:Pending receipt of signed application from patient  and Dr. ( Dr. Odean will be back in office Monday 08-10-24)     Charlott Hamilton,  CPhT-Adv  she/her/hers [facility]  [facility] Pharmacy Technician Patient Advocate Specialist III [facility] Phone: [phone]  Fax: [phone] [email or web address]

## 2024-08-10 ENCOUNTER — Inpatient Hospital Stay

## 2024-08-10 ENCOUNTER — Other Ambulatory Visit: Payer: Self-pay

## 2024-08-10 ENCOUNTER — Inpatient Hospital Stay: Admitting: Pharmacist

## 2024-08-10 VITALS — BP 165/82 | HR 90 | Temp 98.0°F | Resp 18 | Ht 65.0 in | Wt 162.0 lb

## 2024-08-10 DIAGNOSIS — Z170 Estrogen receptor positive status [ER+]: Secondary | ICD-10-CM

## 2024-08-10 DIAGNOSIS — Z23 Encounter for immunization: Secondary | ICD-10-CM

## 2024-08-10 LAB — CBC WITH DIFFERENTIAL (CANCER CENTER ONLY)
Abs Immature Granulocytes: 0.01 K/uL (ref 0.00–0.07)
Basophils Absolute: 0 K/uL (ref 0.0–0.1)
Basophils Relative: 1 %
Eosinophils Absolute: 0.1 K/uL (ref 0.0–0.5)
Eosinophils Relative: 1 %
HCT: 42.6 % (ref 36.0–46.0)
Hemoglobin: 14.4 g/dL (ref 12.0–15.0)
Immature Granulocytes: 0 %
Lymphocytes Relative: 34 %
Lymphs Abs: 1.9 K/uL (ref 0.7–4.0)
MCH: 31.4 pg (ref 26.0–34.0)
MCHC: 33.8 g/dL (ref 30.0–36.0)
MCV: 92.8 fL (ref 80.0–100.0)
Monocytes Absolute: 0.4 K/uL (ref 0.1–1.0)
Monocytes Relative: 7 %
Neutro Abs: 3.1 K/uL (ref 1.7–7.7)
Neutrophils Relative %: 57 %
Platelet Count: 223 K/uL (ref 150–400)
RBC: 4.59 MIL/uL (ref 3.87–5.11)
RDW: 12.7 % (ref 11.5–15.5)
WBC Count: 5.5 K/uL (ref 4.0–10.5)
nRBC: 0 % (ref 0.0–0.2)

## 2024-08-10 LAB — CMP (CANCER CENTER ONLY)
ALT: 25 U/L (ref 0–44)
AST: 20 U/L (ref 15–41)
Albumin: 4.6 g/dL (ref 3.5–5.0)
Alkaline Phosphatase: 70 U/L (ref 38–126)
Anion gap: 11 (ref 5–15)
BUN: 15 mg/dL (ref 8–23)
CO2: 27 mmol/L (ref 22–32)
Calcium: 9.5 mg/dL (ref 8.9–10.3)
Chloride: 102 mmol/L (ref 98–111)
Creatinine: 0.89 mg/dL (ref 0.44–1.00)
GFR, Estimated: >60 mL/min (ref >60)
Glucose, Bld: 95 mg/dL (ref 70–99)
Potassium: 4 mmol/L (ref 3.5–5.1)
Sodium: 141 mmol/L (ref 135–145)
Total Bilirubin: 0.4 mg/dL (ref 0.0–1.2)
Total Protein: 7.2 g/dL (ref 6.5–8.1)

## 2024-08-10 MED ORDER — ONDANSETRON HCL 8 MG PO TABS: 8.0000 mg | ORAL_TABLET | Freq: Three times a day (TID) | ORAL | 2 refills | Status: AC | PRN

## 2024-08-10 MED ORDER — PROCHLORPERAZINE MALEATE 10 MG PO TABS: 10.0000 mg | ORAL_TABLET | Freq: Four times a day (QID) | ORAL | 2 refills | Status: AC | PRN

## 2024-08-10 MED ORDER — INFLUENZA VAC SPLIT HIGH-DOSE 0.5 ML IM SUSY
0.5000 mL | PREFILLED_SYRINGE | Freq: Once | INTRAMUSCULAR | Status: AC
  Administered 2024-08-10: 0.5 mL via INTRAMUSCULAR
  Filled 2024-08-10: qty 0.5

## 2024-08-10 NOTE — Progress Notes (Signed)
 [facility]        Telephone: [phone]?Fax: [phone]   Oncology Clinical Pharmacist Practitioner Initial Assessment   Dawn Booker is a 68 y.o. female with a diagnosis of breast cancer. They were contacted today via in-person visit.  Indication/Regimen Abemaciclib  (Verzenio ) is being used appropriately for treatment of metastatic breast cancer by Dr. Vinay Gudena. The treatment goal is: Control.     Wt Readings from Last 1 Encounters:  08/10/24 162 lb (73.5 kg)    Estimated body surface area is 1.84 meters squared as calculated from the following:   Height as of this encounter: 5' 5 (1.651 m).   Weight as of this encounter: 162 lb (73.5 kg).  The dosing regimen is 50 mg by mouth every 12 hours on days 1 to 28 of a 28-day cycle. This is being given  in combination with letrozole . It is planned to continue until disease progression or unacceptable toxicity. Prescription dose and frequency assessed for appropriateness.  Patient has agreed to treatment which is documented in physician note on 08/05/24. Counseled patient on administration, dosing, side effects, monitoring, drug-food interactions, safe handling, storage, and disposal.  Working on PAP. She does not want to start until 09/03/24 and will see Dr. Odean two weeks later with labs.   Dose Modifications Starting at 50 mg PO BID with plans to increase if tolerated per Dr. Odean  Access Assessment Dawn Booker will be receiving abemaciclib  through [facility] Insurance Concerns: PAP Start date if known: Tentatively 09/03/24  Adherence Assessment Reviewed importance on keeping a med schedule and plan for any missed doses Barriers to adherence identified? No  Communication and Learning Assessment Primary learner: patient Barriers to learning: No barriers Preferred language: English Learning preferences: Listening   Allergies Allergies  Allergen Reactions   Lamictal  [Lamotrigine] Other (See Comments)    Elspeth Louder Syndrome   Cymbalta  [Duloxetine  Hcl]     Elspeth Louder syndrome   Hydrocodone  Nausea Only   Mobic [Meloxicam]     Same ingredients as lamictal   Sulfa Antibiotics Hives   Corticosteroids Rash   Morphine Palpitations and Other (See Comments)   Other     MYCINS - severe abdominal cramping    Prednisone Rash        Prozac  [Fluoxetine  Hcl] Palpitations   Vortioxetine Anxiety and Other (See Comments)    Increased anxiety   Zoloft [Sertraline] Anxiety    Vitals    08/10/2024    2:38 PM 08/05/2024    8:47 AM 07/24/2024    1:45 PM  Oncology Vitals  Height 165 cm 165 cm   Weight 73.483 kg 73.029 kg   Weight (lbs) 162 lbs 161 lbs   BMI 26.96 kg/m2 26.79 kg/m2   Temp 98 F (36.7 C) 98.4 F (36.9 C) 97.4 F (36.3 C)  Pulse Rate 90 98 85  BP 165/82 140/90 127/75  Resp 18 18 16   SpO2 100 % 99 % 96 %  BSA (m2) 1.84 m2 1.83 m2      Laboratory Data    Latest Ref Rng & Units 08/10/2024    1:20 PM 07/24/2024   10:30 AM 05/21/2023   10:50 AM  CBC EXTENDED  WBC 4.0 - 10.5 K/uL 5.5  3.8  4.5   RBC 3.87 - 5.11 MIL/uL 4.59  4.42  4.45   Hemoglobin 12.0 - 15.0 g/dL 85.5  85.7  86.0   HCT 36.0 - 46.0 % 42.6  41.5  42.0   Platelets 150 - 400 K/uL 223  169  181.0   NEUT# 1.7 - 7.7 K/uL 3.1   2.7   Lymph# 0.7 - 4.0 K/uL 1.9   1.5        Latest Ref Rng & Units 08/10/2024    1:20 PM 07/21/2024   12:04 PM 04/21/2024   10:51 AM  CMP  Glucose 70 - 99 mg/dL 95  97  899   BUN 8 - 23 mg/dL 15  14  17    Creatinine 0.44 - 1.00 mg/dL 9.10  9.19  9.00   Sodium 135 - 145 mmol/L 141  138  138   Potassium 3.5 - 5.1 mmol/L 4.0  3.9  4.5   Chloride 98 - 111 mmol/L 102  101  102   CO2 22 - 32 mmol/L 27  29  30    Calcium  8.9 - 10.3 mg/dL 9.5  9.4  9.0   Total Protein 6.5 - 8.1 g/dL 7.2  7.1  7.1   Total Bilirubin 0.0 - 1.2 mg/dL 0.4  0.6  0.6   Alkaline Phos 38 - 126 U/L 70  52  43   AST 15 - 41 U/L 20  15  16    ALT 0 - 44 U/L 25  20  20      Contraindications Contraindications were reviewed? Yes Contraindications to therapy were identified? No   Safety Precautions The following safety precautions for the use of abemaciclib  were reviewed:  Changes in kidney function: importance of drinking plenty of fluids and monitoring urine output Diarrhea: we reviewed that diarrhea is common with abemaciclib  and confirmed that she does have loperamide (Imodium) at home.  We reviewed how to take this medication PRN and gave her information on abemaciclib  Decreased white blood cells (WBCs) and increased risk for infection: we discussed the importance of having a thermometer and what the Centers for Disease Control and Prevention (CDC) considers a fever which is 100.70F (38C) or higher.  Gave patient 24/7 triage line to call if any fevers or symptoms Decreased hemoglobin, part of red blood cells that carry iron and oxygen Fatigue Nausea and Vomiting Hepatotoxicity: reviewed to contact clinic for RUQ pain that will not subside, yellowing of eyes/skin Decreased appetite or weight loss Abdominal pain Decreased platelet count and increased risk for bleeding Venous thromboembolism (VTE): reviewed signs of deep vein thrombosis (DVT) such as leg swelling, redness, pain, or tenderness and signs of pulmonary embolism (PE) such as shortness of breath, rapid or irregular heartbeat, cough, chest pain, or lightheadedness ILD/Pneumonitis: we reviewed potential symptoms including cough, shortness, and fatigue. Handling body fluids and waste Pregnancy, sexual activity, and contraception Avoid grapefruit products Reviewed to take the medication every 12 hours (with food sometimes can be easier on the stomach) and to take it at the same time every day. Discussed proper storage and handling of abemaciclib   Medication Reconciliation Current Outpatient Medications  Medication Sig Dispense Refill   acetaminophen  (TYLENOL ) 500 MG tablet Take 500 mg by mouth at  bedtime.     atorvastatin  (LIPITOR) 10 MG tablet Take 1 tablet by mouth once daily 100 tablet 0   buPROPion  (WELLBUTRIN  XL) 300 MG 24 hr tablet Take 1 tablet (300 mg total) by mouth daily. 100 tablet 1   cetirizine  (ZYRTEC ) 10 MG tablet Take 1 tablet (10 mg total) by mouth daily. 100 tablet 0   Cholecalciferol (VITAMIN D ) 50 MCG (2000 UT) CAPS Take by mouth. 1 capsule daily     Coenzyme  Q10 (COQ10) 100 MG CAPS Take 1 capsule by mouth daily.     Cyanocobalamin  (VITAMIN B12) 500 MCG TABS Take by mouth. 1 tablet by mouth daily (Patient taking differently: Take by mouth. 1/2 tab Sunday and Thursday)     folic acid  (FOLVITE ) 400 MCG tablet Take 400 mcg by mouth daily.     letrozole  (FEMARA ) 2.5 MG tablet Take 1 tablet (2.5 mg total) by mouth daily. 90 tablet 3   MAGNESIUM PO Take by mouth.     omeprazole  (PRILOSEC) 40 MG capsule Take 1 capsule by mouth once daily 100 capsule 0   ondansetron  (ZOFRAN ) 8 MG tablet Take 1 tablet (8 mg total) by mouth every 8 (eight) hours as needed for nausea or vomiting. 30 tablet 2   OVER THE COUNTER MEDICATION 1 tablet daily. Vegan Calcium  1500     prochlorperazine  (COMPAZINE ) 10 MG tablet Take 1 tablet (10 mg total) by mouth every 6 (six) hours as needed for nausea or vomiting. 30 tablet 2   temazepam  (RESTORIL ) 30 MG capsule TAKE 1 CAPSULE BY MOUTH ONCE DAILY AT NIGHT AT BEDTIME 100 capsule 0   [START ON 09/03/2024] abemaciclib  (VERZENIO ) 50 MG tablet Take 1 tablet (50 mg total) by mouth 2 (two) times daily. (Patient not taking: Reported on 08/10/2024) 60 tablet 3   No current facility-administered medications for this visit.   Facility-Administered Medications Ordered in Other Visits  Medication Dose Route Frequency Provider Last Rate Last Admin   Influenza vac split trivalent PF (FLUZONE HIGH-DOSE) injection 0.5 mL  0.5 mL Intramuscular Once Gudena, Vinay, MD       Medication reconciliation is based on the patient's most recent medication list in the electronic  medical record (EMR) including herbal products and OTC medications.   The patient's medication list was reviewed today with the patient? Yes   Drug-drug interactions (DDIs) DDIs were evaluated? Yes Significant DDIs identified? No   Drug-Food Interactions Drug-food interactions were evaluated? Yes Drug-food interactions identified? Grapefruit products  Follow-up Plan  Patient education handout given to patient Start abemaciclib  50 mg by mouth every 12 hours. Start date tentative on 09/03/24 per patient preference. Working on PAP now. Continue letrozole  2.5 mg by mouth daily Monitor for side effects Flu shot today May have a liver procedure done at [facility]. She will communicate this back to Dr. Gara office after appt tomorrow. Distress thermometer not done as patient has received prior treatment at [facility] She will see Dr. Odean with labs on 09/16/24 Dawn Booker can follow up with clinical pharmacy as deemed necessary by Dr. Mackey Odean going forward   Dawn Booker participated in the discussion, expressed understanding, and voiced agreement with the above plan. All questions were answered to her satisfaction. The patient was advised to contact the clinic at [phone] with any questions or concerns prior to her return visit.   I spent 60 minutes assessing the patient.  Manly Nestle A. Lucila, PharmD, BCOP, CPP  Norleen DELENA Lucila, RPH-CPP, 08/10/2024 3:14 PM  **Disclaimer: This note was dictated with voice recognition software. Similar sounding words can inadvertently be transcribed and this note may contain transcription errors which may not have been corrected upon publication of note.**

## 2024-08-10 NOTE — Telephone Encounter (Signed)
 Oral Oncology Patient Advocate Encounter    Patient has requested copay assistance. At this time no grant funding is available. Patient has decided to apply for the PAP through [employer] for Verzenio . I have sent the application to the patient via [employer].  I have received patient's signed copy.   Status:Pending receipt of signed application from Dr. Gudena

## 2024-08-10 NOTE — Progress Notes (Unsigned)
 A user error has taken place: encounter opened in error, closed for administrative reasons.

## 2024-08-11 ENCOUNTER — Encounter: Payer: Self-pay | Admitting: Hematology and Oncology

## 2024-08-11 ENCOUNTER — Telehealth: Payer: Self-pay

## 2024-08-11 LAB — CANCER ANTIGEN 27.29: CA 27.29: 26.3 U/mL (ref 0.0–38.6)

## 2024-08-11 NOTE — Telephone Encounter (Signed)
 Oral Oncology Patient Advocate Encounter   Received notification that prior authorization for Ondansetron  is required.   PA submitted on 08/11/2024 Key [id number] Status is pending      Charlott Hamilton,  CPhT-Adv  she/her/hers [facility]  [facility] Special[facility] Pharm[facility] Services Pharmacy Technician Patient Advocate Specialist III [facility] Phone: [phone]  Fax: [phone] [email or web address]

## 2024-08-11 NOTE — Telephone Encounter (Signed)
 Oral Oncology Patient Advocate Encounter   Application for assistance for Verzenio  through [employer] has been faxed in with both patient and doctor signatures.   Status: Pending approval      Dawn Booker,  CPhT-Adv  she/her/hers [employer] [facility]  [facility] Specialty Pharmacy Services Pharmacy Technician Patient Advocate Specialist III [facility] Phone: [phone]  Fax: [phone] [email or web address]

## 2024-08-12 ENCOUNTER — Other Ambulatory Visit (HOSPITAL_COMMUNITY): Payer: Self-pay

## 2024-08-12 NOTE — Telephone Encounter (Addendum)
 Oral Oncology Patient Advocate Encounter  Prior Authorization for Ondansetron  has been approved.    KEY # [id number] Effective dates: 08/12/2024 through 09/02/2025  Patient has been notified via MyChart  Expected delivery  09/02/2024 from [facility],  CPhT-Adv  she/her/hers [facility]  [facility] Specialty Pharmacy Services Pharmacy Technician Patient Advocate Specialist III [facility] Phone: [phone]  Fax: [phone] [email or web address]

## 2024-08-13 ENCOUNTER — Encounter: Payer: Self-pay | Admitting: Pharmacist

## 2024-08-14 ENCOUNTER — Other Ambulatory Visit: Payer: Self-pay | Admitting: Pharmacist

## 2024-08-17 NOTE — Telephone Encounter (Signed)
 Oral Oncology Patient Advocate Encounter  I followed up with [employer] on Verzenio  PAP application status. They have not reviewed patient's application  due to influx of applications. They are expediting her application for review to due this is a new start.   Status: Pending Approval    Charlott Hamilton,  CPhT-Adv  she/her/hers [facility]  [facility] Special[facility] Pharmacy Services Pharmacy Technician Patient Advocate Specialist III [facility] Phone: [phone]  Fax: [phone] [email or web address]

## 2024-08-17 NOTE — Telephone Encounter (Signed)
 Oral Oncology Patient Advocate Encounter  [employer] PAP application for Verzenio  has been approved through 09/02/2025.  Prescription sent to [facility] [phone]  Patient has been informed via phone    Charlott Hamilton,  CPhT-Adv  she/her/hers [facility]  [facility] Specialty Pharmacy Services Pharmacy Technician Patient Advocate Specialist III [facility] and [place] Phone: [phone]  Fax: [phone] [email or web address]

## 2024-08-18 ENCOUNTER — Other Ambulatory Visit (HOSPITAL_COMMUNITY): Payer: Self-pay | Admitting: General Surgery

## 2024-08-18 DIAGNOSIS — K769 Liver disease, unspecified: Secondary | ICD-10-CM

## 2024-08-24 ENCOUNTER — Other Ambulatory Visit: Payer: Self-pay | Admitting: Family Medicine

## 2024-08-24 DIAGNOSIS — E7800 Pure hypercholesterolemia, unspecified: Secondary | ICD-10-CM

## 2024-08-28 ENCOUNTER — Telehealth: Payer: Self-pay

## 2024-08-28 NOTE — Telephone Encounter (Signed)
 A user error has taken place: encounter opened in error, closed for administrative reasons.

## 2024-08-31 ENCOUNTER — Ambulatory Visit (HOSPITAL_COMMUNITY)

## 2024-09-01 ENCOUNTER — Encounter: Payer: Self-pay | Admitting: Pharmacist

## 2024-09-08 ENCOUNTER — Other Ambulatory Visit: Payer: Self-pay | Admitting: Family Medicine

## 2024-09-08 DIAGNOSIS — F5105 Insomnia due to other mental disorder: Secondary | ICD-10-CM

## 2024-09-08 DIAGNOSIS — F411 Generalized anxiety disorder: Secondary | ICD-10-CM

## 2024-09-08 DIAGNOSIS — F331 Major depressive disorder, recurrent, moderate: Secondary | ICD-10-CM

## 2024-09-09 ENCOUNTER — Encounter: Payer: Self-pay | Admitting: Hematology and Oncology

## 2024-09-15 ENCOUNTER — Encounter: Payer: Self-pay | Admitting: Hematology and Oncology

## 2024-09-15 ENCOUNTER — Other Ambulatory Visit: Payer: Self-pay

## 2024-09-15 DIAGNOSIS — C50911 Malignant neoplasm of unspecified site of right female breast: Secondary | ICD-10-CM

## 2024-09-15 DIAGNOSIS — Z170 Estrogen receptor positive status [ER+]: Secondary | ICD-10-CM

## 2024-09-16 ENCOUNTER — Encounter: Payer: Self-pay | Admitting: *Deleted

## 2024-09-16 ENCOUNTER — Inpatient Hospital Stay: Admitting: Hematology and Oncology

## 2024-09-16 ENCOUNTER — Inpatient Hospital Stay: Attending: Hematology and Oncology

## 2024-09-16 VITALS — BP 130/88 | HR 84 | Temp 98.0°F | Resp 18 | Ht 65.0 in | Wt 162.5 lb

## 2024-09-16 DIAGNOSIS — C50911 Malignant neoplasm of unspecified site of right female breast: Secondary | ICD-10-CM

## 2024-09-16 DIAGNOSIS — C50812 Malignant neoplasm of overlapping sites of left female breast: Secondary | ICD-10-CM

## 2024-09-16 DIAGNOSIS — Z170 Estrogen receptor positive status [ER+]: Secondary | ICD-10-CM | POA: Diagnosis not present

## 2024-09-16 LAB — CBC WITH DIFFERENTIAL (CANCER CENTER ONLY)
Abs Immature Granulocytes: 0.01 K/uL (ref 0.00–0.07)
Basophils Absolute: 0 K/uL (ref 0.0–0.1)
Basophils Relative: 0 %
Eosinophils Absolute: 0.1 K/uL (ref 0.0–0.5)
Eosinophils Relative: 2 %
HCT: 40.5 % (ref 36.0–46.0)
Hemoglobin: 13.7 g/dL (ref 12.0–15.0)
Immature Granulocytes: 0 %
Lymphocytes Relative: 38 %
Lymphs Abs: 1.6 K/uL (ref 0.7–4.0)
MCH: 31.5 pg (ref 26.0–34.0)
MCHC: 33.8 g/dL (ref 30.0–36.0)
MCV: 93.1 fL (ref 80.0–100.0)
Monocytes Absolute: 0.3 K/uL (ref 0.1–1.0)
Monocytes Relative: 6 %
Neutro Abs: 2.2 K/uL (ref 1.7–7.7)
Neutrophils Relative %: 54 %
Platelet Count: 209 K/uL (ref 150–400)
RBC: 4.35 MIL/uL (ref 3.87–5.11)
RDW: 12.5 % (ref 11.5–15.5)
WBC Count: 4.1 K/uL (ref 4.0–10.5)
nRBC: 0 % (ref 0.0–0.2)

## 2024-09-16 LAB — CMP (CANCER CENTER ONLY)
ALT: 18 U/L (ref 0–44)
AST: 17 U/L (ref 15–41)
Albumin: 4.5 g/dL (ref 3.5–5.0)
Alkaline Phosphatase: 71 U/L (ref 38–126)
Anion gap: 12 (ref 5–15)
BUN: 10 mg/dL (ref 8–23)
CO2: 27 mmol/L (ref 22–32)
Calcium: 9.4 mg/dL (ref 8.9–10.3)
Chloride: 101 mmol/L (ref 98–111)
Creatinine: 1.04 mg/dL — ABNORMAL HIGH (ref 0.44–1.00)
GFR, Estimated: 58 mL/min — ABNORMAL LOW
Glucose, Bld: 85 mg/dL (ref 70–99)
Potassium: 4.1 mmol/L (ref 3.5–5.1)
Sodium: 139 mmol/L (ref 135–145)
Total Bilirubin: 0.4 mg/dL (ref 0.0–1.2)
Total Protein: 7.1 g/dL (ref 6.5–8.1)

## 2024-09-16 NOTE — Assessment & Plan Note (Signed)
 06/13/2018:Bilateral mastectomies: Left: ILC, grade 2, 5.4 cm, LCIS, margins negative, lymphovascular invasion present, 2/7 lymph nodes positive with extranodal extension (1 additional lymph node had isolated tumor cells), right mastectomy benign; T3N1a ER 100%, PR 10%, HER-2 -1+, Ki-67 10 to 15%, stage Ib MammaPrint: Low risk, luminal type a Adjuvant radiation therapy: 08/14/2018- MRI back: L4 and L5 small lesions could not rule out metastatic disease.  PET CT negative for bone metastases.   Treatment: Tamoxifen  20 mg daily Started 11/07/2018-stopped 01/01/2019 due to profound depression/anxiety Couldn't tolerate Anastrozole    10/28/2018 PET CT scan: No evidence of metastatic disease, no hypermetabolism at L4-L5 vertebral bodies to correspond to the MRI findings.   CT abdomen 06/30/2019: No findings to suggest the cause of abdominal discomfort.  No metastatic disease Osteoporosis: T score -3.1   Breast cancer surveillance: 11/10/2022: Ultrasound: No suspicious masses 04/29/2024: Guardant reveal: CT DNA positive less than 0.05% 06/01/2024: CT CAP: New ill-defined lesion anterior right lobe of the liver measuring 1.7 cm highly suspicious for hepatic metastasis.    Liver MRI 06/17/2024: Multiple small cysts both right and left hepatic lobes: Differential diagnosis metastasis and sclerosing hemangioma   PET CT 07/06/24: Isolated liver met, hyper met porta hep LN [facility] 07/07/2024: PIK3CA mutation   Plan: Liver biopsy: 07/24/2024: Metastatic breast cancer positive for GATA3 and negative for SOX10, ER 100%, PR 0%, HER2 1+ Recommendation: Add Verzinio to letrozole  Concern for osteoporosis T-score -2.9: Patient cannot tolerate Prolia  well.  We discussed about doing Zometa once a year.    Patient is undergoing histotripsy at [facility] next week.   Current treatment: Verzinio started September 03, 2024. Verzinio toxicities:  Return to clinic in 2 weeks for labs and follow-up

## 2024-09-16 NOTE — Progress Notes (Signed)
 "  Patient Care Team: [name], NP as PCP - General (Family Medicine) Odean Potts, MD as Consulting Physician (Hematology and Oncology) Curvin Deward MOULD, MD as Consulting Physician (General Surgery) Causey, Morna Pickle, NP as Nurse Practitioner (Hematology and Oncology) Kristie Lamprey, MD as Consulting Physician (Gastroenterology) Rutherford Gain, MD as Consulting Physician (Obstetrics and Gynecology)  DIAGNOSIS:  Encounter Diagnosis  Name Primary?   Malignant neoplasm of overlapping sites of left breast in female, estrogen receptor positive (HCC) Yes    SUMMARY OF ONCOLOGIC HISTORY: Oncology History  Malignant neoplasm of overlapping sites of left breast in female, estrogen receptor positive (HCC)  07/2015 Miscellaneous   Genetics negative.  Genes tested: APC, ATM, BARD1, BMPR1A, BRCA1, BRCA2, BRIP1, CDH1, CDK4, CDKN2A, CHEK2, EPCAM, GREM1, MLH1, MSH2, MSH6, MUTYH, PALB2, PMS2, POLD1, POLE, PTEN, RAD51C, RAD51D, RET, SMAD4, STK11, and TP53.      05/06/2018 Initial Diagnosis   Screening detected left breast mass anteriorly 1030 to 11 o'clock position 2 masses 6 mm and 7 mm, 12:30 position 2.6 cm both of these masses biopsy-proven grade 2 invasive lobular cancer ER 100%, PR 10%, Ki-67 15%, HER-2 negative 1+ by IHC, lymph node positive for malignancy T2N1 stage IIa clinical stage   05/21/2018 Cancer Staging   Staging form: Breast, AJCC 8th Edition - Pathologic: Stage IB (pT3, pN1a, cM0, G2, ER+, PR+, HER2-) - Signed by Odean Potts, MD on 06/24/2018   06/13/2018 Surgery   Bilateral mastectomies: Left: ILC, grade 2, 5.4 cm, LCIS, margins negative, lymphovascular invasion present, 2/7 lymph nodes positive with extranodal extension (1 additional lymph node had isolated tumor cells), right mastectomy benign; T3N1a ER 100%, PR 10%, HER-2 -1+, Ki-67 10 to 15%, stage Ib   08/06/2018 - 09/25/2018 Radiation Therapy   Adjuvant XRT   10/09/2018 -  Anti-estrogen oral therapy    Anastrozole  1mg  daily, plan for 7 years; switched to tamoxifen  11/07/18 for bone health; opted to forego all antiestrogen therapy     CHIEF COMPLIANT:   HISTORY OF PRESENT ILLNESS: Discussed the use of AI scribe software for clinical note transcription with the patient, who gave verbal consent to proceed.  History of Present Illness Dawn Booker is a 69 year old female with metastatic ER+ HER2- invasive lobular carcinoma of the left breast with hepatic involvement who presents for routine oncology follow-up and medication management during the first month of a new regimen.  She started abemaciclib  100 mg daily with letrozole  for metastatic ER+ breast cancer with liver involvement and is in the first treatment month.  Over the past two weeks she has had three episodes of diarrhea occurring soon after taking the medication or eating. She finds this manageable and has no other new gastrointestinal symptoms.  She missed three doses of abemaciclib  due to pill box confusion but has since established a routine to avoid further missed doses. She drinks about 64 ounces of water daily and asks if this is adequate. She reports no other new or different symptoms since the last visit.  Aug 05, 2024: Follow-up visit to discuss treatment plan for metastatic ER+ breast cancer; patient experiencing severe fatigue and hot flashes on letrozole , with concerns about bone health due to osteoporosis. Counseling provided regarding addition of Verzenio  and annual Zometa; plan to start Verzenio  on September 03, 2024. Scheduled for histotripsy and further education and monitoring discussed.     ALLERGIES:  is allergic to lamictal [lamotrigine], cymbalta  [duloxetine  hcl], hydrocodone , mobic [meloxicam], sulfa antibiotics, corticosteroids, morphine, other, prednisone, prozac  [  fluoxetine  hcl], vortioxetine, and zoloft [sertraline].  MEDICATIONS:  Current Outpatient Medications  Medication Sig Dispense Refill    abemaciclib  (VERZENIO ) 50 MG tablet Take 1 tablet (50 mg total) by mouth 2 (two) times daily. (Patient not taking: Reported on 08/10/2024) 60 tablet 3   acetaminophen  (TYLENOL ) 500 MG tablet Take 500 mg by mouth at bedtime.     atorvastatin  (LIPITOR) 10 MG tablet Take 1 tablet by mouth once daily 100 tablet 0   buPROPion  (WELLBUTRIN  XL) 300 MG 24 hr tablet Take 1 tablet (300 mg total) by mouth daily. 100 tablet 1   cetirizine  (ZYRTEC ) 10 MG tablet Take 1 tablet (10 mg total) by mouth daily. 100 tablet 0   Cholecalciferol (VITAMIN D ) 50 MCG (2000 UT) CAPS Take by mouth. 1 capsule daily     Coenzyme Q10 (COQ10) 100 MG CAPS Take 1 capsule by mouth daily.     Cyanocobalamin  (VITAMIN B12) 500 MCG TABS Take by mouth. 1 tablet by mouth daily (Patient taking differently: Take by mouth. 1/2 tab Sunday and Thursday)     folic acid  (FOLVITE ) 400 MCG tablet Take 400 mcg by mouth daily.     letrozole  (FEMARA ) 2.5 MG tablet Take 1 tablet (2.5 mg total) by mouth daily. 90 tablet 3   MAGNESIUM PO Take by mouth.     omeprazole  (PRILOSEC) 40 MG capsule Take 1 capsule by mouth once daily 100 capsule 0   ondansetron  (ZOFRAN ) 8 MG tablet Take 1 tablet (8 mg total) by mouth every 8 (eight) hours as needed for nausea or vomiting. 30 tablet 2   OVER THE COUNTER MEDICATION 1 tablet daily. Vegan Calcium  1500     prochlorperazine  (COMPAZINE ) 10 MG tablet Take 1 tablet (10 mg total) by mouth every 6 (six) hours as needed for nausea or vomiting. 30 tablet 2   temazepam  (RESTORIL ) 30 MG capsule TAKE 1 CAPSULE BY MOUTH ONCE DAILY AT NIGHT AT BEDTIME 100 capsule 0   No current facility-administered medications for this visit.    PHYSICAL EXAMINATION: ECOG PERFORMANCE STATUS: 1 - Symptomatic but completely ambulatory  Vitals:   09/16/24 1135  BP: 130/88  Pulse: 84  Resp: 18  Temp: 98 F (36.7 C)  SpO2: 100%   Filed Weights   09/16/24 1135  Weight: 162 lb 8 oz (73.7 kg)    Physical Exam   (exam performed in the  presence of a chaperone)  LABORATORY DATA:  I have reviewed the data as listed    Latest Ref Rng & Units 09/16/2024   11:03 AM 08/10/2024    1:20 PM 07/21/2024   12:04 PM  CMP  Glucose 70 - 99 mg/dL 85  95  97   BUN 8 - 23 mg/dL 10  15  14    Creatinine 0.44 - 1.00 mg/dL 8.95  9.10  9.19   Sodium 135 - 145 mmol/L 139  141  138   Potassium 3.5 - 5.1 mmol/L 4.1  4.0  3.9   Chloride 98 - 111 mmol/L 101  102  101   CO2 22 - 32 mmol/L 27  27  29    Calcium  8.9 - 10.3 mg/dL 9.4  9.5  9.4   Total Protein 6.5 - 8.1 g/dL 7.1  7.2  7.1   Total Bilirubin 0.0 - 1.2 mg/dL 0.4  0.4  0.6   Alkaline Phos 38 - 126 U/L 71  70  52   AST 15 - 41 U/L 17  20  15    ALT  0 - 44 U/L 18  25  20      Lab Results  Component Value Date   WBC 4.1 09/16/2024   HGB 13.7 09/16/2024   HCT 40.5 09/16/2024   MCV 93.1 09/16/2024   PLT 209 09/16/2024   NEUTROABS 2.2 09/16/2024    ASSESSMENT & PLAN:  Malignant neoplasm of overlapping sites of left breast in female, estrogen receptor positive (HCC) 06/13/2018:Bilateral mastectomies: Left: ILC, grade 2, 5.4 cm, LCIS, margins negative, lymphovascular invasion present, 2/7 lymph nodes positive with extranodal extension (1 additional lymph node had isolated tumor cells), right mastectomy benign; T3N1a ER 100%, PR 10%, HER-2 -1+, Ki-67 10 to 15%, stage Ib MammaPrint: Low risk, luminal type a Adjuvant radiation therapy: 08/14/2018- MRI back: L4 and L5 small lesions could not rule out metastatic disease.  PET CT negative for bone metastases.   Treatment: Tamoxifen  20 mg daily Started 11/07/2018-stopped 01/01/2019 due to profound depression/anxiety Couldn't tolerate Anastrozole    10/28/2018 PET CT scan: No evidence of metastatic disease, no hypermetabolism at L4-L5 vertebral bodies to correspond to the MRI findings.   CT abdomen 06/30/2019: No findings to suggest the cause of abdominal discomfort.  No metastatic disease Osteoporosis: T score -3.1   Breast cancer  surveillance: 11/10/2022: Ultrasound: No suspicious masses 04/29/2024: Guardant reveal: CT DNA positive less than 0.05% 06/01/2024: CT CAP: New ill-defined lesion anterior right lobe of the liver measuring 1.7 cm highly suspicious for hepatic metastasis.    Liver MRI 06/17/2024: Multiple small cysts both right and left hepatic lobes: Differential diagnosis metastasis and sclerosing hemangioma   PET CT 07/06/24: Isolated liver met, hyper met porta hep LN [email or web address] 07/07/2024: PIK3CA mutation   Plan: Liver biopsy: 07/24/2024: Metastatic breast cancer positive for GATA3 and negative for SOX10, ER 100%, PR 0%, HER2 1+ Recommendation: Add Verzinio to letrozole  Concern for osteoporosis T-score -2.9: Patient cannot tolerate Prolia  well.  We discussed about doing Zometa once a year.    Patient is undergoing [facility] next week.   Current treatment: Verzinio started September 03, 2024. Verzinio toxicities: Mild diarrhea: Has not required Imodium.  I discussed with her that in the next 2 weeks if she does not have any further diarrhea we will consider increasing the dosage to 150 p.o. twice daily.  Denies any fatigue or any other symptoms.  Liver function test were normal.  Return to clinic in 2 weeks for labs and follow-up with Norleen. ------------------------------------- Assessment and Plan Assessment & Plan Estrogen receptor positive malignant neoplasm of overlapping sites of the left breast Normal CBC, renal, and hepatic function. Therapy aims to suppress tumor growth via endocrine inhibition. Risks discussed include diarrhea.  - Continued letrozole  at current dose; monitor for diarrhea and fatigue. - Ordered laboratory studies (CBC, LFTs, renal function, tumor markers) every two weeks for the first two months, then monthly. - Advised to maintain hydration with at least 64 ounces of water daily. - Planned follow-up imaging after three months of systemic therapy to assess  response. - Transition to monthly follow-up after initial two months if stable.  Chronic low white blood cell count Chronic leukopenia, currently with normal WBC. Ongoing monitoring required. - Continued routine blood count monitoring (CBC every two weeks for first two months, then monthly).      No orders of the defined types were placed in this encounter.  The patient has a good understanding of the overall plan. she agrees with it. she will call with any problems that may develop before the next  visit here.  I personally spent a total of 30 minutes in the care of the patient today including preparing to see the patient, getting/reviewing separately obtained history, performing a medically appropriate exam/evaluation, counseling and educating, placing orders, referring and communicating with other health care professionals, documenting clinical information in the EHR, independently interpreting results, communicating results, and coordinating care.   Viinay K Jaydeen Odor, MD 09/16/2024    "

## 2024-09-16 NOTE — Progress Notes (Signed)
 Per MD request RN sent email to [facility] team requesting to cancel testing due to pt currently having metastatic disease and under treatment.

## 2024-09-17 LAB — CANCER ANTIGEN 27.29: CA 27.29: 20.7 U/mL (ref 0.0–38.6)

## 2024-09-21 ENCOUNTER — Other Ambulatory Visit: Payer: Self-pay | Admitting: Pharmacist

## 2024-09-21 ENCOUNTER — Encounter: Payer: Self-pay | Admitting: Pharmacist

## 2024-09-22 ENCOUNTER — Encounter: Payer: Self-pay | Admitting: Hematology and Oncology

## 2024-09-22 ENCOUNTER — Telehealth: Payer: PPO | Admitting: Hematology and Oncology

## 2024-09-23 ENCOUNTER — Other Ambulatory Visit: Payer: Self-pay | Admitting: Family Medicine

## 2024-09-23 DIAGNOSIS — F339 Major depressive disorder, recurrent, unspecified: Secondary | ICD-10-CM

## 2024-09-24 NOTE — Progress Notes (Unsigned)
 [facility]        Telephone: [phone]?Fax: [phone]   Oncology Clinical Pharmacist Practitioner Progress Note   Dawn Booker was contacted via in-person to discuss her chemotherapy regimen for abemaciclib  which they receive under the care of Dr. Vinay Gudena.  Current treatment regimen and start date Abemaciclib  (09/03/24) Letrozole  (07/08/24)  Interval History She continues on abemaciclib  50 mg by mouth every 12 hours on days 1 to 28 of a 28-day cycle. This is being given in combination with letrozole . Therapy is planned to continue until disease progression or unacceptable toxicity.   She was last seen by Dr. Odean on 09/16/24 and clinical pharmacy on 08/10/25. Dr. Odean mentioned if she continues to tolerate her abemaciclib  to possibly increase the dose to 100 mg PO BID.  Response to Therapy ***.   Labs, vitals, treatment parameters, and manufacturer guidelines assessing toxicity were reviewed with Dawn Booker today. Based on these values, patient is in agreement to {JAICONTINUEHOLD:25245} abemaciclib  therapy at this time.  Allergies Allergies[1]  Vitals    09/16/2024   11:35 AM 08/10/2024    2:38 PM 08/05/2024    8:47 AM  Oncology Vitals  Height 165 cm 165 cm 165 cm  Weight 73.71 kg 73.483 kg 73.029 kg  Weight (lbs) 162 lbs 8 oz 162 lbs 161 lbs  BMI 27.04 kg/m2 26.96 kg/m2 26.79 kg/m2  Temp 98 F (36.7 C) 98 F (36.7 C) 98.4 F (36.9 C)  Pulse Rate 84 90 98  BP 130/88 165/82 140/90  Resp 18 18 18   SpO2 100 % 100 % 99 %  BSA (m2) 1.84 m2 1.84 m2 1.83 m2    Laboratory Data    Latest Ref Rng & Units 09/16/2024   11:03 AM 08/10/2024    1:20 PM 07/24/2024   10:30 AM  CBC EXTENDED  WBC 4.0 - 10.5 K/uL 4.1  5.5  3.8   RBC 3.87 - 5.11 MIL/uL 4.35  4.59  4.42   Hemoglobin 12.0 - 15.0 g/dL 86.2  85.5  85.7   HCT 36.0 - 46.0 % 40.5  42.6  41.5   Platelets 150 - 400 K/uL 209  223  169   NEUT# 1.7 - 7.7 K/uL 2.2  3.1    Lymph# 0.7 - 4.0  K/uL 1.6  1.9         Latest Ref Rng & Units 09/16/2024   11:03 AM 08/10/2024    1:20 PM 07/21/2024   12:04 PM  CMP  Glucose 70 - 99 mg/dL 85  95  97   BUN 8 - 23 mg/dL 10  15  14    Creatinine 0.44 - 1.00 mg/dL 8.95  9.10  9.19   Sodium 135 - 145 mmol/L 139  141  138   Potassium 3.5 - 5.1 mmol/L 4.1  4.0  3.9   Chloride 98 - 111 mmol/L 101  102  101   CO2 22 - 32 mmol/L 27  27  29    Calcium  8.9 - 10.3 mg/dL 9.4  9.5  9.4   Total Protein 6.5 - 8.1 g/dL 7.1  7.2  7.1   Total Bilirubin 0.0 - 1.2 mg/dL 0.4  0.4  0.6   Alkaline Phos 38 - 126 U/L 71  70  52   AST 15 - 41 U/L 17  20  15    ALT 0 - 44 U/L 18  25  20      Lab Results  Component Value Date  MG 1.9 05/21/2023   Lab Results  Component Value Date   CA2729 20.7 09/16/2024   CA2729 26.3 08/10/2024     Adverse Effects Assessment ***  Adherence Assessment Dawn Booker reports missing {NUMBERS; 0-10:5044} doses over the past {NUMBERS; 0-10:5044} weeks.   Reason for missed dose: *** Patient was re-educated on importance of adherence.   Access Assessment Dawn Booker is currently receiving her abemaciclib  through [facility] concerns:  None  Medication Reconciliation The patient's medication list was reviewed today with the patient? {yes/no:20286} New medications or herbal supplements have recently been started? {YES/NO:21197} Any medications have been discontinued? {YES/NO:21197} The medication list was updated and reconciled based on the patient's most recent medication list in the electronic medical record (EMR) including herbal products and OTC medications.   Medications Current Outpatient Medications  Medication Sig Dispense Refill   abemaciclib  (VERZENIO ) 50 MG tablet Take 1 tablet (50 mg total) by mouth 2 (two) times daily. (Patient not taking: Reported on 08/10/2024) 60 tablet 3   acetaminophen  (TYLENOL ) 500 MG tablet Take 500 mg by mouth at bedtime.     atorvastatin  (LIPITOR)  10 MG tablet Take 1 tablet by mouth once daily 100 tablet 0   buPROPion  (WELLBUTRIN  XL) 300 MG 24 hr tablet Take 1 tablet by mouth once daily 100 tablet 0   cetirizine  (ZYRTEC ) 10 MG tablet Take 1 tablet (10 mg total) by mouth daily. 100 tablet 0   Cholecalciferol (VITAMIN D ) 50 MCG (2000 UT) CAPS Take by mouth. 1 capsule daily     Coenzyme Q10 (COQ10) 100 MG CAPS Take 1 capsule by mouth daily.     Cyanocobalamin  (VITAMIN B12) 500 MCG TABS Take by mouth. 1 tablet by mouth daily (Patient taking differently: Take by mouth. 1/2 tab Sunday and Thursday)     folic acid  (FOLVITE ) 400 MCG tablet Take 400 mcg by mouth daily.     letrozole  (FEMARA ) 2.5 MG tablet Take 1 tablet (2.5 mg total) by mouth daily. 90 tablet 3   MAGNESIUM PO Take by mouth.     omeprazole  (PRILOSEC) 40 MG capsule Take 1 capsule by mouth once daily 100 capsule 0   ondansetron  (ZOFRAN ) 8 MG tablet Take 1 tablet (8 mg total) by mouth every 8 (eight) hours as needed for nausea or vomiting. 30 tablet 2   OVER THE COUNTER MEDICATION 1 tablet daily. Vegan Calcium  1500     prochlorperazine  (COMPAZINE ) 10 MG tablet Take 1 tablet (10 mg total) by mouth every 6 (six) hours as needed for nausea or vomiting. 30 tablet 2   temazepam  (RESTORIL ) 30 MG capsule TAKE 1 CAPSULE BY MOUTH ONCE DAILY AT NIGHT AT BEDTIME 100 capsule 0   No current facility-administered medications for this visit.    Drug-Drug Interactions (DDIs) DDIs were evaluated? Yes Significant DDIs? No  The patient was instructed to speak with their health care provider and/or the oral chemotherapy pharmacist before starting any new drug, including prescription or over the counter, natural / herbal products, or vitamins.  Supportive Care Diarrhea: we reviewed that diarrhea is common with abemaciclib  and recommended to  have loperamide (Imodium) at home.  We reviewed how to take this medication PRN. Neutropenia: we discussed the importance of having a thermometer and what the  Centers for Disease Control and Prevention (CDC) considers a fever which is 100.22F (38C) or higher.  Gave patient 24/7 triage line to call if any fevers or symptoms. ILD/Pneumonitis: we reviewed potential symptoms including cough,  shortness, and fatigue.  VTE: reviewed signs of DVT such as leg swelling, redness, pain, or tenderness and signs of PE such as shortness of breath, rapid or irregular heartbeat, cough, chest pain, or lightheadedness. Reviewed to take the medication every 12 hours (with food sometimes can be easier on the stomach) and to take it at the same time every day. Hepatotoxicity:*** Drug interactions with grapefruit products ***  Dosing Assessment Hepatic adjustments needed? {YES/NO:21197} Renal adjustments needed? {YES/NO:21197} Toxicity adjustments needed? {YES/NO:21197} The current dosing regimen {JAIISNOT:25241::is} appropriate to continue at this time.  Follow-Up Plan {JAICONTINUEHOLD:25316::Continue} abemaciclib  50 by mouth every 12 hours Continue letrozole  2.5 mg by mouth daily *** Will add labs, pharmacy clinic visit in 2 weeks Labs, Dr. Odean visit scheduled for  11/03/24 Dawn Booker can follow up with clinical pharmacy as deemed necessary by Dr. Mackey Odean going forward   Dawn Booker participated in the discussion, expressed understanding, and voiced agreement with the above plan. All questions were answered to their satisfaction. The patient was advised to contact the clinic at [phone] with any questions or concerns prior to their return visit.   I spent 30 minutes assessing and educating the patient.  Moishe Schellenberg A. Lucila, PharmD, BCOP, CPP  Norleen DELENA Lucila, RPH-CPP, 09/24/2024  10:14 AM   **Disclaimer: This note was dictated with voice recognition software. Similar sounding words can inadvertently be transcribed and this note may contain transcription errors which may not have been corrected upon publication of note.**     [1]   Allergies Allergen Reactions   Lamictal [Lamotrigine] Other (See Comments)    Elspeth Louder Syndrome   Cymbalta  [Duloxetine  Hcl]     Elspeth Louder syndrome   Hydrocodone  Nausea Only   Mobic [Meloxicam]     Same ingredients as lamictal   Sulfa Antibiotics Hives   Corticosteroids Rash   Morphine Palpitations and Other (See Comments)   Other     MYCINS - severe abdominal cramping    Prednisone Rash        Prozac  [Fluoxetine  Hcl] Palpitations   Vortioxetine Anxiety and Other (See Comments)    Increased anxiety   Zoloft [Sertraline] Anxiety

## 2024-09-28 ENCOUNTER — Inpatient Hospital Stay

## 2024-09-28 ENCOUNTER — Inpatient Hospital Stay: Admitting: Pharmacist

## 2024-09-28 ENCOUNTER — Inpatient Hospital Stay: Admitting: Hematology and Oncology

## 2024-10-01 NOTE — Progress Notes (Signed)
 [facility]        Telephone: [phone]?Fax: [phone]   Oncology Clinical Pharmacist Practitioner Progress Note   Dawn Booker was contacted via in-person to discuss her chemotherapy regimen for abemaciclib  which they receive under the care of Dr. Vinay Gudena.  Current treatment regimen and start date Abemaciclib  (09/03/24) Letrozole  (07/08/24)  Interval History She continues on abemaciclib  50 mg by mouth every 12 hours on days 1 to 28 of a 28-day cycle. This is being given in combination with letrozole . Therapy is planned to continue until disease progression or unacceptable toxicity.   She was last seen by Dr. Odean on 09/16/24 and clinical pharmacy on 08/10/25. Dr. Odean mentioned if she continues to tolerate her abemaciclib  to possibly increase the dose to 100 mg PO BID.  Response to Therapy She is doing well. Feeling a little under the weather today but no specific symptoms and afebrile. She has been having some constipation but has increased her fiber intake which has helped.  Her WBC decreased so for now we will hold off increasing the abemaciclib . She would like to have labs and see us  again in two weeks. This will be scheduled.  She has missed 4-5 doses but now has alarms going on her phone which has helped.   Labs, vitals, treatment parameters, and manufacturer guidelines assessing toxicity were reviewed with Dawn Booker today. Based on these values, patient is in agreement to continue abemaciclib  therapy at this time.  Allergies Allergies[1]  Vitals    10/07/2024   10:48 AM 09/16/2024   11:35 AM 08/10/2024    2:38 PM  Oncology Vitals  Height  165 cm 165 cm  Weight 74.072 kg 73.71 kg 73.483 kg  Weight (lbs) 163 lbs 5 oz 162 lbs 8 oz 162 lbs  BMI 27.17 kg/m2 27.04 kg/m2 26.96 kg/m2  Temp 97.6 F (36.4 C) 98 F (36.7 C) 98 F (36.7 C)  Pulse Rate 92 84 90  BP 142/89 130/88 165/82  Resp 17 18 18   SpO2 99 % 100 % 100 %  BSA (m2) 1.84 m2  1.84 m2 1.84 m2    Laboratory Data    Latest Ref Rng & Units 10/07/2024   10:28 AM 09/16/2024   11:03 AM 08/10/2024    1:20 PM  CBC EXTENDED  WBC 4.0 - 10.5 K/uL 3.3  4.1  5.5   RBC 3.87 - 5.11 MIL/uL 4.15  4.35  4.59   Hemoglobin 12.0 - 15.0 g/dL 86.6  86.2  85.5   HCT 36.0 - 46.0 % 38.6  40.5  42.6   Platelets 150 - 400 K/uL 220  209  223   NEUT# 1.7 - 7.7 K/uL 1.7  2.2  3.1   Lymph# 0.7 - 4.0 K/uL 1.4  1.6  1.9        Latest Ref Rng & Units 10/07/2024   10:28 AM 09/16/2024   11:03 AM 08/10/2024    1:20 PM  CMP  Glucose 70 - 99 mg/dL 78  85  95   BUN 8 - 23 mg/dL 11  10  15    Creatinine 0.44 - 1.00 mg/dL 8.98  8.95  9.10   Sodium 135 - 145 mmol/L 136  139  141   Potassium 3.5 - 5.1 mmol/L 4.1  4.1  4.0   Chloride 98 - 111 mmol/L 100  101  102   CO2 22 - 32 mmol/L 27  27  27    Calcium  8.9 -  10.3 mg/dL 9.5  9.4  9.5   Total Protein 6.5 - 8.1 g/dL 7.0  7.1  7.2   Total Bilirubin 0.0 - 1.2 mg/dL 0.5  0.4  0.4   Alkaline Phos 38 - 126 U/L 84  71  70   AST 15 - 41 U/L 16  17  20    ALT 0 - 44 U/L 18  18  25      Lab Results  Component Value Date   MG 1.9 05/21/2023   Lab Results  Component Value Date   CA2729 20.7 09/16/2024   CA2729 26.3 08/10/2024     Adverse Effects Assessment WBC: decreased but WNL. Monitor  Adherence Assessment Dawn Booker reports missing 5 doses over the past 2 weeks.   Reason for missed dose: forgot Patient was re-educated on importance of adherence.   Access Assessment Dawn Booker is currently receiving her abemaciclib  through [facility] concerns:  None  Medication Reconciliation The patient's medication list was reviewed today with the patient? Yes New medications or herbal supplements have recently been started? No  Any medications have been discontinued? No  The medication list was updated and reconciled based on the patient's most recent medication list in the electronic medical record (EMR) including  herbal products and OTC medications.   Medications Current Outpatient Medications  Medication Sig Dispense Refill   abemaciclib  (VERZENIO ) 50 MG tablet Take 1 tablet (50 mg total) by mouth 2 (two) times daily. (Patient not taking: Reported on 08/10/2024) 60 tablet 3   acetaminophen  (TYLENOL ) 500 MG tablet Take 500 mg by mouth at bedtime.     atorvastatin  (LIPITOR) 10 MG tablet Take 1 tablet by mouth once daily 100 tablet 0   buPROPion  (WELLBUTRIN  XL) 300 MG 24 hr tablet Take 1 tablet by mouth once daily 100 tablet 0   cetirizine  (ZYRTEC ) 10 MG tablet Take 1 tablet (10 mg total) by mouth daily. 100 tablet 0   Cholecalciferol (VITAMIN D ) 50 MCG (2000 UT) CAPS Take by mouth. 1 capsule daily     Coenzyme Q10 (COQ10) 100 MG CAPS Take 1 capsule by mouth daily.     Cyanocobalamin  (VITAMIN B12) 500 MCG TABS Take by mouth. 1 tablet by mouth daily (Patient taking differently: Take by mouth. 1/2 tab Sunday and Thursday)     folic acid  (FOLVITE ) 400 MCG tablet Take 400 mcg by mouth daily.     letrozole  (FEMARA ) 2.5 MG tablet Take 1 tablet (2.5 mg total) by mouth daily. 90 tablet 3   MAGNESIUM PO Take by mouth.     omeprazole  (PRILOSEC) 40 MG capsule Take 1 capsule by mouth once daily 100 capsule 0   ondansetron  (ZOFRAN ) 8 MG tablet Take 1 tablet (8 mg total) by mouth every 8 (eight) hours as needed for nausea or vomiting. 30 tablet 2   OVER THE COUNTER MEDICATION 1 tablet daily. Vegan Calcium  1500     prochlorperazine  (COMPAZINE ) 10 MG tablet Take 1 tablet (10 mg total) by mouth every 6 (six) hours as needed for nausea or vomiting. 30 tablet 2   temazepam  (RESTORIL ) 30 MG capsule TAKE 1 CAPSULE BY MOUTH ONCE DAILY AT NIGHT AT BEDTIME 100 capsule 0   No current facility-administered medications for this visit.    Drug-Drug Interactions (DDIs) DDIs were evaluated? Yes Significant DDIs? No  The patient was instructed to speak with their health care provider and/or the oral chemotherapy pharmacist before  starting any new drug, including prescription or over  the counter, natural / herbal products, or vitamins.  Supportive Care Diarrhea: we reviewed that diarrhea is common with abemaciclib  and recommended to  have loperamide (Imodium) at home.  We reviewed how to take this medication PRN. Neutropenia: we discussed the importance of having a thermometer and what the Centers for Disease Control and Prevention (CDC) considers a fever which is 100.40F (38C) or higher.  Gave patient 24/7 triage line to call if any fevers or symptoms. ILD/Pneumonitis: we reviewed potential symptoms including cough, shortness, and fatigue.  VTE: reviewed signs of DVT such as leg swelling, redness, pain, or tenderness and signs of PE such as shortness of breath, rapid or irregular heartbeat, cough, chest pain, or lightheadedness. Reviewed to take the medication every 12 hours (with food sometimes can be easier on the stomach) and to take it at the same time every day. Hepatotoxicity:WNL Drug interactions with grapefruit products  Dosing Assessment Hepatic adjustments needed? No  Renal adjustments needed? No  Toxicity adjustments needed? No  The current dosing regimen is appropriate to continue at this time.  Follow-Up Plan Continue abemaciclib  50 by mouth every 12 hours. Discuss increasing dose at next visit. Continue letrozole  2.5 mg by mouth daily Monitor WBC and constipation Will add labs, pharmacy clinic visit in 2 weeks Labs, Dr. Odean visit scheduled for  11/03/24 Prolia  yearly due to tolerability is managed by her PCP office. She discussed with Dr. Gudena about potentially switching on once yearly Zometa at last visit. We reviewed differences today with Prolia  and Zometa. She prefers to stay with Prolia  for now. Last DEXA was 12/25/23 by PCP office showing osteoporosis. Dawn Booker can follow up with clinical pharmacy as deemed necessary by Dr. Mackey Odean going forward   Dawn Booker participated in  the discussion, expressed understanding, and voiced agreement with the above plan. All questions were answered to their satisfaction. The patient was advised to contact the clinic at [phone] with any questions or concerns prior to their return visit.   I spent 30 minutes assessing and educating the patient.  Brayson Livesey A. Booker, PharmD, BCOP, CPP  Dawn Booker, RPH-CPP, 10/07/2024  11:12 AM   **Disclaimer: This note was dictated with voice recognition software. Similar sounding words can inadvertently be transcribed and this note may contain transcription errors which may not have been corrected upon publication of note.**      [1]  Allergies Allergen Reactions   Lamictal [Lamotrigine] Other (See Comments)    Elspeth Louder Syndrome   Cymbalta  [Duloxetine  Hcl]     Elspeth Louder syndrome   Hydrocodone  Nausea Only   Mobic [Meloxicam]     Same ingredients as lamictal   Sulfa Antibiotics Hives   Corticosteroids Rash   Morphine Palpitations and Other (See Comments)   Other     MYCINS - severe abdominal cramping    Prednisone Rash        Prozac  [Fluoxetine  Hcl] Palpitations   Vortioxetine Anxiety and Other (See Comments)    Increased anxiety   Zoloft [Sertraline] Anxiety

## 2024-10-05 ENCOUNTER — Other Ambulatory Visit: Payer: Self-pay | Admitting: Family Medicine

## 2024-10-05 DIAGNOSIS — K219 Gastro-esophageal reflux disease without esophagitis: Secondary | ICD-10-CM

## 2024-10-07 ENCOUNTER — Encounter: Payer: Self-pay | Admitting: Family Medicine

## 2024-10-07 ENCOUNTER — Inpatient Hospital Stay: Admitting: Pharmacist

## 2024-10-07 ENCOUNTER — Inpatient Hospital Stay: Attending: Hematology and Oncology

## 2024-10-07 VITALS — BP 142/89 | HR 92 | Temp 97.6°F | Resp 17 | Wt 163.3 lb

## 2024-10-07 DIAGNOSIS — Z170 Estrogen receptor positive status [ER+]: Secondary | ICD-10-CM

## 2024-10-07 DIAGNOSIS — M810 Age-related osteoporosis without current pathological fracture: Secondary | ICD-10-CM

## 2024-10-07 LAB — CBC WITH DIFFERENTIAL (CANCER CENTER ONLY)
Abs Immature Granulocytes: 0.01 10*3/uL (ref 0.00–0.07)
Basophils Absolute: 0 10*3/uL (ref 0.0–0.1)
Basophils Relative: 1 %
Eosinophils Absolute: 0 10*3/uL (ref 0.0–0.5)
Eosinophils Relative: 1 %
HCT: 38.6 % (ref 36.0–46.0)
Hemoglobin: 13.3 g/dL (ref 12.0–15.0)
Immature Granulocytes: 0 %
Lymphocytes Relative: 41 %
Lymphs Abs: 1.4 10*3/uL (ref 0.7–4.0)
MCH: 32 pg (ref 26.0–34.0)
MCHC: 34.5 g/dL (ref 30.0–36.0)
MCV: 93 fL (ref 80.0–100.0)
Monocytes Absolute: 0.2 10*3/uL (ref 0.1–1.0)
Monocytes Relative: 5 %
Neutro Abs: 1.7 10*3/uL (ref 1.7–7.7)
Neutrophils Relative %: 52 %
Platelet Count: 220 10*3/uL (ref 150–400)
RBC: 4.15 MIL/uL (ref 3.87–5.11)
RDW: 13.2 % (ref 11.5–15.5)
WBC Count: 3.3 10*3/uL — ABNORMAL LOW (ref 4.0–10.5)
nRBC: 0 % (ref 0.0–0.2)

## 2024-10-07 LAB — CMP (CANCER CENTER ONLY)
ALT: 18 U/L (ref 0–44)
AST: 16 U/L (ref 15–41)
Albumin: 4.5 g/dL (ref 3.5–5.0)
Alkaline Phosphatase: 84 U/L (ref 38–126)
Anion gap: 10 (ref 5–15)
BUN: 11 mg/dL (ref 8–23)
CO2: 27 mmol/L (ref 22–32)
Calcium: 9.5 mg/dL (ref 8.9–10.3)
Chloride: 100 mmol/L (ref 98–111)
Creatinine: 1.01 mg/dL — ABNORMAL HIGH (ref 0.44–1.00)
GFR, Estimated: >60 mL/min
Glucose, Bld: 78 mg/dL (ref 70–99)
Potassium: 4.1 mmol/L (ref 3.5–5.1)
Sodium: 136 mmol/L (ref 135–145)
Total Bilirubin: 0.5 mg/dL (ref 0.0–1.2)
Total Protein: 7 g/dL (ref 6.5–8.1)

## 2024-10-08 ENCOUNTER — Telehealth: Payer: Self-pay

## 2024-10-08 ENCOUNTER — Other Ambulatory Visit: Payer: Self-pay

## 2024-10-08 ENCOUNTER — Other Ambulatory Visit (HOSPITAL_COMMUNITY): Payer: Self-pay

## 2024-10-08 DIAGNOSIS — M810 Age-related osteoporosis without current pathological fracture: Secondary | ICD-10-CM

## 2024-10-08 LAB — CANCER ANTIGEN 27.29: CA 27.29: 23.9 U/mL (ref 0.0–38.6)

## 2024-10-08 MED ORDER — DENOSUMAB 60 MG/ML ~~LOC~~ SOSY: 60.0000 mg | PREFILLED_SYRINGE | Freq: Once | SUBCUTANEOUS | Status: DC

## 2024-10-08 NOTE — Telephone Encounter (Signed)
 Prolia  VOB initiated via [email or web address]  Next Prolia  inj DUE: 10/2024

## 2024-10-08 NOTE — Telephone Encounter (Signed)
 Pt ready for scheduling for PROLIA  on or after : 10/2024  Option# 1: Buy/Bill (Office supplied medication)  Out-of-pocket cost due at time of clinic visit: $352  Number of injection/visits approved: ---  Primary: [employer] Prolia  co-insurance: 20% Admin fee co-insurance: 0%  Secondary: --- Prolia  co-insurance:  Admin fee co-insurance:   Medical Benefit Details: Date Benefits were checked: 10/08/24 Deductible: NO/ Coinsurance: 20%/ Admin Fee: 0%  Prior Auth: N/A PA# Expiration Date:   # of doses approved: ----------------------------------------------------------------------- Option# 2- Med Obtained from pharmacy: [facility] PREFERRED FOR PHARMACY BENEFIT  Pharmacy benefit: Copay $560.19 (Paid to pharmacy) Admin Fee: 0% (Pay at clinic)  Prior Auth: N/A PA# Expiration Date:   # of doses approved:   If patient wants fill through the pharmacy benefit please send prescription to: [facility], and include estimated need by date in rx notes. Pharmacy will ship medication directly to the office.  Patient NOT eligible for Prolia  Copay Card. Copay Card can make patient's cost as little as $25. Link to apply: [email or web address]  ** This summary of benefits is an estimation of the patient's out-of-pocket cost. Exact cost may very based on individual plan coverage.

## 2024-10-08 NOTE — Progress Notes (Signed)
 Patient is on Bone Density report and is overdue for medication.

## 2024-10-09 MED ORDER — DENOSUMAB 60 MG/ML ~~LOC~~ SOSY: 60.0000 mg | PREFILLED_SYRINGE | Freq: Once | SUBCUTANEOUS | 0 refills | Status: AC

## 2024-10-09 NOTE — Progress Notes (Signed)
 Patient declined medication due to cost.

## 2024-10-09 NOTE — Addendum Note (Signed)
 Addended by: DIONISIO COLLIE PARAS on: 10/09/2024 09:47 AM   Modules accepted: Orders

## 2024-10-09 NOTE — Telephone Encounter (Signed)
 Patient is ready for Prolia  scheduling on or after 10/09/2024. Please contact patient for scheduling and advise patient of $352 copay. Please respond with appt date and time.

## 2024-10-20 ENCOUNTER — Inpatient Hospital Stay

## 2024-10-20 ENCOUNTER — Inpatient Hospital Stay: Admitting: Pharmacist

## 2024-10-21 ENCOUNTER — Ambulatory Visit: Admitting: Family Medicine

## 2024-11-03 ENCOUNTER — Inpatient Hospital Stay

## 2024-11-03 ENCOUNTER — Inpatient Hospital Stay: Admitting: Hematology and Oncology
